# Patient Record
Sex: Male | Born: 1985 | Race: White | Hispanic: No | Marital: Married | Smoking: Current every day smoker
Health system: Southern US, Community
[De-identification: ages and names within clinical notes are randomized; demographics above are authoritative.]

## PROBLEM LIST (undated history)

## (undated) DIAGNOSIS — L02419 Cutaneous abscess of limb, unspecified: Secondary | ICD-10-CM

## (undated) DIAGNOSIS — B9689 Other specified bacterial agents as the cause of diseases classified elsewhere: Secondary | ICD-10-CM

## (undated) DIAGNOSIS — J189 Pneumonia, unspecified organism: Secondary | ICD-10-CM

## (undated) DIAGNOSIS — M00051 Staphylococcal arthritis, right hip: Secondary | ICD-10-CM

## (undated) DIAGNOSIS — M00011 Staphylococcal arthritis, right shoulder: Secondary | ICD-10-CM

## (undated) DIAGNOSIS — I368 Other nonrheumatic tricuspid valve disorders: Secondary | ICD-10-CM

## (undated) DIAGNOSIS — J181 Lobar pneumonia, unspecified organism: Secondary | ICD-10-CM

## (undated) DIAGNOSIS — R768 Other specified abnormal immunological findings in serum: Secondary | ICD-10-CM

## (undated) DIAGNOSIS — Z86718 Personal history of other venous thrombosis and embolism: Secondary | ICD-10-CM

## (undated) DIAGNOSIS — M71162 Other infective bursitis, left knee: Secondary | ICD-10-CM

## (undated) DIAGNOSIS — I079 Rheumatic tricuspid valve disease, unspecified: Secondary | ICD-10-CM

## (undated) DIAGNOSIS — R7881 Bacteremia: Secondary | ICD-10-CM

## (undated) DIAGNOSIS — M00052 Staphylococcal arthritis, left hip: Secondary | ICD-10-CM

## (undated) DIAGNOSIS — R569 Unspecified convulsions: Secondary | ICD-10-CM

## (undated) HISTORY — DX: Cutaneous abscess of limb, unspecified: L02.419

## (undated) HISTORY — DX: Other nonrheumatic tricuspid valve disorders: I36.8

## (undated) HISTORY — DX: Other specified abnormal immunological findings in serum: R76.8

## (undated) HISTORY — DX: Lobar pneumonia, unspecified organism: J18.1

## (undated) HISTORY — DX: Staphylococcal arthritis, left hip: M00.052

## (undated) HISTORY — DX: Pneumonia, unspecified organism: J18.9

## (undated) HISTORY — DX: Bacteremia: R78.81

## (undated) HISTORY — DX: Other specified bacterial agents as the cause of diseases classified elsewhere: B96.89

## (undated) HISTORY — DX: Staphylococcal arthritis, right hip: M00.051

## (undated) HISTORY — DX: Other infective bursitis, left knee: M71.162

## (undated) HISTORY — DX: Rheumatic tricuspid valve disease, unspecified: I07.9

## (undated) HISTORY — DX: Personal history of other venous thrombosis and embolism: Z86.718

## (undated) HISTORY — PX: HAND SURGERY: SHX662

## (undated) HISTORY — PX: CHOLECYSTECTOMY: SHX55

## (undated) HISTORY — DX: Staphylococcal arthritis, right shoulder: M00.011

---
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
## 2005-05-31 ENCOUNTER — Emergency Department (HOSPITAL_COMMUNITY): Admission: EM | Admit: 2005-05-31 | Discharge: 2005-05-31 | Payer: Self-pay | Admitting: Family Medicine

## 2006-03-17 ENCOUNTER — Emergency Department (HOSPITAL_COMMUNITY): Admission: EM | Admit: 2006-03-17 | Discharge: 2006-03-17 | Payer: Self-pay | Admitting: Emergency Medicine

## 2006-06-19 ENCOUNTER — Emergency Department (HOSPITAL_COMMUNITY): Admission: EM | Admit: 2006-06-19 | Discharge: 2006-06-19 | Payer: Self-pay | Admitting: Emergency Medicine

## 2007-06-18 ENCOUNTER — Emergency Department (HOSPITAL_COMMUNITY): Admission: EM | Admit: 2007-06-18 | Discharge: 2007-06-18 | Payer: Self-pay | Admitting: Emergency Medicine

## 2007-06-23 ENCOUNTER — Ambulatory Visit (HOSPITAL_COMMUNITY): Admission: RE | Admit: 2007-06-23 | Discharge: 2007-06-23 | Payer: Self-pay | Admitting: Orthopedic Surgery

## 2007-06-27 ENCOUNTER — Encounter: Admission: RE | Admit: 2007-06-27 | Discharge: 2007-06-28 | Payer: Self-pay | Admitting: Orthopedic Surgery

## 2008-07-03 ENCOUNTER — Emergency Department (HOSPITAL_COMMUNITY): Admission: EM | Admit: 2008-07-03 | Discharge: 2008-07-03 | Payer: Self-pay | Admitting: Emergency Medicine

## 2008-11-29 ENCOUNTER — Emergency Department (HOSPITAL_BASED_OUTPATIENT_CLINIC_OR_DEPARTMENT_OTHER): Admission: EM | Admit: 2008-11-29 | Discharge: 2008-11-29 | Payer: Self-pay | Admitting: Emergency Medicine

## 2008-11-29 ENCOUNTER — Ambulatory Visit: Payer: Self-pay | Admitting: Psychiatry

## 2008-11-29 ENCOUNTER — Inpatient Hospital Stay (HOSPITAL_COMMUNITY): Admission: AD | Admit: 2008-11-29 | Discharge: 2008-12-03 | Payer: Self-pay | Admitting: Psychiatry

## 2009-05-27 ENCOUNTER — Inpatient Hospital Stay (HOSPITAL_COMMUNITY): Admission: EM | Admit: 2009-05-27 | Discharge: 2009-05-30 | Payer: Self-pay | Admitting: Emergency Medicine

## 2009-05-27 ENCOUNTER — Emergency Department (HOSPITAL_COMMUNITY): Admission: EM | Admit: 2009-05-27 | Discharge: 2009-05-27 | Payer: Self-pay | Admitting: Emergency Medicine

## 2009-05-29 ENCOUNTER — Encounter (INDEPENDENT_AMBULATORY_CARE_PROVIDER_SITE_OTHER): Payer: Self-pay | Admitting: General Surgery

## 2010-01-21 ENCOUNTER — Emergency Department (HOSPITAL_COMMUNITY): Admission: EM | Admit: 2010-01-21 | Discharge: 2010-01-21 | Payer: Self-pay | Admitting: Family Medicine

## 2010-01-22 ENCOUNTER — Ambulatory Visit (HOSPITAL_COMMUNITY): Admission: AD | Admit: 2010-01-22 | Discharge: 2010-01-22 | Payer: Self-pay | Admitting: Orthopedic Surgery

## 2010-05-05 ENCOUNTER — Emergency Department (HOSPITAL_COMMUNITY): Admission: EM | Admit: 2010-05-05 | Discharge: 2010-05-05 | Payer: Self-pay | Admitting: Family Medicine

## 2010-06-16 ENCOUNTER — Emergency Department (HOSPITAL_COMMUNITY): Admission: EM | Admit: 2010-06-16 | Discharge: 2010-06-16 | Payer: Self-pay | Admitting: Emergency Medicine

## 2010-09-17 ENCOUNTER — Inpatient Hospital Stay (INDEPENDENT_AMBULATORY_CARE_PROVIDER_SITE_OTHER)
Admission: RE | Admit: 2010-09-17 | Discharge: 2010-09-17 | Disposition: A | Discharge status: Home / Self Care | Payer: Self-pay | Source: Ambulatory Visit | Attending: Emergency Medicine | Admitting: Emergency Medicine

## 2010-09-17 DIAGNOSIS — I889 Nonspecific lymphadenitis, unspecified: Secondary | ICD-10-CM

## 2010-09-17 DIAGNOSIS — R070 Pain in throat: Secondary | ICD-10-CM

## 2010-09-17 LAB — DIFFERENTIAL
Basophils Absolute: 0 10*3/uL (ref 0.0–0.1)
Basophils Relative: 1 % (ref 0–1)
Eosinophils Absolute: 0.2 10*3/uL (ref 0.0–0.7)
Eosinophils Relative: 3 % (ref 0–5)
Lymphocytes Relative: 33 % (ref 12–46)
Lymphs Abs: 2.1 10*3/uL (ref 0.7–4.0)
Monocytes Absolute: 0.7 10*3/uL (ref 0.1–1.0)
Monocytes Relative: 11 % (ref 3–12)
Neutrophils Relative %: 53 % (ref 43–77)

## 2010-09-17 LAB — POCT RAPID STREP A (OFFICE): Streptococcus, Group A Screen (Direct): NEGATIVE

## 2010-09-17 LAB — POCT INFECTIOUS MONO SCREEN: Mono Screen: NEGATIVE

## 2010-09-20 ENCOUNTER — Emergency Department (HOSPITAL_COMMUNITY): Payer: Self-pay

## 2010-09-20 ENCOUNTER — Emergency Department (HOSPITAL_COMMUNITY)
Admission: EM | Admit: 2010-09-20 | Discharge: 2010-09-20 | Disposition: A | Discharge status: Home / Self Care | Payer: Self-pay | Attending: Emergency Medicine | Admitting: Emergency Medicine

## 2010-09-20 DIAGNOSIS — I889 Nonspecific lymphadenitis, unspecified: Secondary | ICD-10-CM | POA: Insufficient documentation

## 2010-09-20 DIAGNOSIS — R1110 Vomiting, unspecified: Secondary | ICD-10-CM | POA: Insufficient documentation

## 2010-09-20 DIAGNOSIS — R220 Localized swelling, mass and lump, head: Secondary | ICD-10-CM | POA: Insufficient documentation

## 2010-09-20 DIAGNOSIS — M542 Cervicalgia: Secondary | ICD-10-CM | POA: Insufficient documentation

## 2010-09-20 DIAGNOSIS — R070 Pain in throat: Secondary | ICD-10-CM | POA: Insufficient documentation

## 2010-09-20 LAB — BASIC METABOLIC PANEL
BUN: 11 mg/dL (ref 6–23)
CO2: 23 mEq/L (ref 19–32)
Calcium: 9.2 mg/dL (ref 8.4–10.5)
Chloride: 105 mEq/L (ref 96–112)
Creatinine, Ser: 0.88 mg/dL (ref 0.4–1.5)
GFR calc Af Amer: >60 mL/min (ref >60)
Glucose, Bld: 116 mg/dL — ABNORMAL HIGH (ref 70–99)
Potassium: 4.8 mEq/L (ref 3.5–5.1)
Sodium: 137 mEq/L (ref 135–145)

## 2010-09-20 LAB — CBC
HCT: 42.1 % (ref 39.0–52.0)
HCT: 45.4 % (ref 39.0–52.0)
Hemoglobin: 14.8 g/dL (ref 13.0–17.0)
Hemoglobin: 16.4 g/dL (ref 13.0–17.0)
MCH: 33.6 pg (ref 26.0–34.0)
MCH: 33.3 pg (ref 26.0–34.0)
MCHC: 35.2 g/dL (ref 30.0–36.0)
MCHC: 36.1 g/dL — ABNORMAL HIGH (ref 30.0–36.0)
MCV: 95.5 fL (ref 78.0–100.0)
MCV: 92.1 fL (ref 78.0–100.0)
Platelets: 189 10*3/uL (ref 150–400)
Platelets: 192 10*3/uL (ref 150–400)
RBC: 4.41 MIL/uL (ref 4.22–5.81)
RBC: 4.93 MIL/uL (ref 4.22–5.81)
RDW: 12.7 % (ref 11.5–15.5)
RDW: 12.4 % (ref 11.5–15.5)
WBC: 6.5 10*3/uL (ref 4.0–10.5)

## 2010-09-20 LAB — DIFFERENTIAL
Basophils Absolute: 0 10*3/uL (ref 0.0–0.1)
Basophils Relative: 1 % (ref 0–1)
Eosinophils Absolute: 0.1 10*3/uL (ref 0.0–0.7)
Eosinophils Relative: 1 % (ref 0–5)
Lymphocytes Relative: 24 % (ref 12–46)
Lymphs Abs: 1.9 10*3/uL (ref 0.7–4.0)
Monocytes Absolute: 0.9 10*3/uL (ref 0.1–1.0)
Monocytes Relative: 12 % (ref 3–12)

## 2010-09-20 MED ORDER — IOHEXOL 300 MG/ML  SOLN: 75.0000 mL | Freq: Once | INTRAMUSCULAR | Status: DC | PRN

## 2010-10-18 LAB — BASIC METABOLIC PANEL
BUN: 9 mg/dL (ref 6–23)
CO2: 26 mEq/L (ref 19–32)
Calcium: 8.9 mg/dL (ref 8.4–10.5)
Chloride: 109 mEq/L (ref 96–112)
Creatinine, Ser: 0.88 mg/dL (ref 0.4–1.5)
GFR calc Af Amer: >60 mL/min (ref >60)
GFR calc non Af Amer: >60 mL/min (ref >60)
Glucose, Bld: 96 mg/dL (ref 70–99)
Potassium: 4.1 mEq/L (ref 3.5–5.1)
Sodium: 140 mEq/L (ref 135–145)

## 2010-10-18 LAB — SURGICAL PCR SCREEN
MRSA, PCR: NEGATIVE
Staphylococcus aureus: NEGATIVE

## 2010-10-18 LAB — CBC
HCT: 43.8 % (ref 39.0–52.0)
Hemoglobin: 14.9 g/dL (ref 13.0–17.0)
MCH: 34 pg (ref 26.0–34.0)
MCHC: 34 g/dL (ref 30.0–36.0)
MCV: 100 fL (ref 78.0–100.0)
Platelets: 206 10*3/uL (ref 150–400)
RBC: 4.38 MIL/uL (ref 4.22–5.81)
RDW: 12.8 % (ref 11.5–15.5)
WBC: 5.3 10*3/uL (ref 4.0–10.5)

## 2010-11-05 LAB — DIFFERENTIAL
Basophils Absolute: 0 10*3/uL (ref 0.0–0.1)
Basophils Relative: 0 % (ref 0–1)
Eosinophils Absolute: 0 10*3/uL (ref 0.0–0.7)
Eosinophils Relative: 0 % (ref 0–5)
Lymphocytes Relative: 14 % (ref 12–46)
Lymphs Abs: 1.6 10*3/uL (ref 0.7–4.0)
Monocytes Absolute: 0.6 10*3/uL (ref 0.1–1.0)
Monocytes Relative: 6 % (ref 3–12)
Neutro Abs: 8.7 10*3/uL — ABNORMAL HIGH (ref 1.7–7.7)
Neutrophils Relative %: 79 % — ABNORMAL HIGH (ref 43–77)

## 2010-11-05 LAB — URINALYSIS, ROUTINE W REFLEX MICROSCOPIC
Bilirubin Urine: NEGATIVE
Glucose, UA: NEGATIVE mg/dL
Hgb urine dipstick: NEGATIVE
Ketones, ur: NEGATIVE mg/dL
Nitrite: NEGATIVE
Protein, ur: NEGATIVE mg/dL
Specific Gravity, Urine: 1.012 (ref 1.005–1.030)
Urobilinogen, UA: 0.2 mg/dL (ref 0.0–1.0)
pH: 8 (ref 5.0–8.0)

## 2010-11-05 LAB — CBC
HCT: 35.6 % — ABNORMAL LOW (ref 39.0–52.0)
HCT: 43.7 % (ref 39.0–52.0)
Hemoglobin: 12.6 g/dL — ABNORMAL LOW (ref 13.0–17.0)
Hemoglobin: 15.4 g/dL (ref 13.0–17.0)
MCHC: 35.3 g/dL (ref 30.0–36.0)
MCHC: 35.4 g/dL (ref 30.0–36.0)
MCV: 97.2 fL (ref 78.0–100.0)
MCV: 97.6 fL (ref 78.0–100.0)
Platelets: 188 K/uL (ref 150–400)
Platelets: 263 10*3/uL (ref 150–400)
RBC: 3.67 MIL/uL — ABNORMAL LOW (ref 4.22–5.81)
RBC: 4.47 MIL/uL (ref 4.22–5.81)
RDW: 12.5 % (ref 11.5–15.5)
RDW: 12.6 % (ref 11.5–15.5)
WBC: 11 10*3/uL — ABNORMAL HIGH (ref 4.0–10.5)
WBC: 6.1 K/uL (ref 4.0–10.5)

## 2010-11-05 LAB — COMPREHENSIVE METABOLIC PANEL WITH GFR
ALT: 25 U/L (ref 0–53)
AST: 16 U/L (ref 0–37)
Albumin: 3.3 g/dL — ABNORMAL LOW (ref 3.5–5.2)
Alkaline Phosphatase: 76 U/L (ref 39–117)
BUN: 5 mg/dL — ABNORMAL LOW (ref 6–23)
CO2: 28 meq/L (ref 19–32)
Calcium: 8.6 mg/dL (ref 8.4–10.5)
Chloride: 107 meq/L (ref 96–112)
Creatinine, Ser: 0.94 mg/dL (ref 0.4–1.5)
GFR calc non Af Amer: >60 mL/min
Glucose, Bld: 86 mg/dL (ref 70–99)
Potassium: 4.2 meq/L (ref 3.5–5.1)
Sodium: 140 meq/L (ref 135–145)
Total Bilirubin: 0.7 mg/dL (ref 0.3–1.2)
Total Protein: 5.9 g/dL — ABNORMAL LOW (ref 6.0–8.3)

## 2010-11-05 LAB — COMPREHENSIVE METABOLIC PANEL
ALT: 35 U/L (ref 0–53)
AST: 24 U/L (ref 0–37)
Albumin: 4.4 g/dL (ref 3.5–5.2)
Alkaline Phosphatase: 89 U/L (ref 39–117)
BUN: 6 mg/dL (ref 6–23)
CO2: 25 mEq/L (ref 19–32)
Calcium: 9.5 mg/dL (ref 8.4–10.5)
Chloride: 102 mEq/L (ref 96–112)
Creatinine, Ser: 0.8 mg/dL (ref 0.4–1.5)
GFR calc Af Amer: >60 mL/min (ref >60)
GFR calc non Af Amer: >60 mL/min (ref >60)
Glucose, Bld: 96 mg/dL (ref 70–99)
Potassium: 4.1 mEq/L (ref 3.5–5.1)
Sodium: 138 mEq/L (ref 135–145)
Total Bilirubin: 0.7 mg/dL (ref 0.3–1.2)
Total Protein: 7.9 g/dL (ref 6.0–8.3)

## 2010-11-05 LAB — LIPASE, BLOOD: Lipase: <10 U/L — ABNORMAL LOW (ref 11–59)

## 2010-11-11 LAB — CBC
HCT: 46.7 % (ref 39.0–52.0)
Hemoglobin: 16.2 g/dL (ref 13.0–17.0)
MCHC: 34.8 g/dL (ref 30.0–36.0)
RDW: 11.7 % (ref 11.5–15.5)

## 2010-11-11 LAB — DIFFERENTIAL
Basophils Absolute: 0 10*3/uL (ref 0.0–0.1)
Eosinophils Relative: 3 % (ref 0–5)
Lymphocytes Relative: 26 % (ref 12–46)
Monocytes Absolute: 0.7 10*3/uL (ref 0.1–1.0)

## 2010-11-11 LAB — BASIC METABOLIC PANEL
CO2: 25 mEq/L (ref 19–32)
GFR calc non Af Amer: >60 mL/min (ref >60)
Glucose, Bld: 94 mg/dL (ref 70–99)
Potassium: 4.1 mEq/L (ref 3.5–5.1)
Sodium: 142 mEq/L (ref 135–145)

## 2010-11-11 LAB — HEPATIC FUNCTION PANEL
ALT: 9 U/L (ref 0–53)
Bilirubin, Direct: 0 mg/dL (ref 0.0–0.3)
Indirect Bilirubin: 0.8 mg/dL (ref 0.3–0.9)
Total Bilirubin: 0.8 mg/dL (ref 0.3–1.2)

## 2010-11-11 LAB — POCT TOXICOLOGY PANEL
Cocaine: POSITIVE
Tetrahydrocannabinol: POSITIVE
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
## 2010-12-15 NOTE — Op Note (Signed)
[name], Gary Hicks                   ACCOUNT NO.:  [id number]   MEDICAL RECORD NO.:  [id number]          PATIENT TYPE:  AMB   LOCATION:  SDS                          FACILITY:  [facility]   PHYSICIAN:  Artist Pais. Weingold, M.D.DATE OF BIRTH:  1986/04/15   DATE OF PROCEDURE:  06/23/2007  DATE OF DISCHARGE:  06/23/2007                               OPERATIVE REPORT   PREOPERATIVE DIAGNOSIS:  Displaced fifth metacarpal fracture, right  hand.   POSTOPERATIVE DIAGNOSIS:  Displaced fifth metacarpal fracture, right  hand.   PROCEDURE:  Open reduction internal fixation of above using 1.6-mm IM  rod.   SURGEON:  Artist Pais. Mina Marble, M.D.   ASSISTANT:  None.   ANESTHESIA:  General.   TOURNIQUET TIME:  26 minutes.  No complication.  No drains.   OPERATIVE REPORT:  The patient was taken to operating suite.  After the  induction of adequate general anesthesia, right upper extremity was  prepped and draped in sterile fashion.  An Esmarch was used to  exsanguinate the limb.  Tourniquet was then inflated to 250 mmHg.  At  this point in time, incision was made transversely at the base of the  fifth metacarpal.  Skin was incised.  Dissection was carried down to the  metacarpal diaphyseal flair.  Fluoroscopy was used to identify the  proximal most aspect of the fifth metacarpal.  At this point in time,  using the [employer] awl, a small cortical wound was made on the  dorsal ulnar side of the fifth metacarpal base.  The IM rod was  introduced into the intramedullary canal.  Manual traction, fluoroscopic  guidance and pressure over the distal fragment was applied.  The IM rod  was then passed across the fracture site.  Once this was done,  intraoperative fluoroscopy revealed near anatomic reduction in both the  AP, lateral and oblique view.  The IM rod was cut outside the skin, bent  to 90 degrees and then cut below the skin.  Wound was irrigated and  loosely closed with 4-0 Vicryl Rapide.   Xeroform, 4x4s and ulnar gutter  splint was applied.  The patient tolerated the procedure well and was  taken to the recovery room in stable fashion.      Artist Pais Mina Marble, M.D.  Electronically Signed     MAW/MEDQ  D:  06/23/2007  T:  06/24/2007  Job:  [id number]

## 2010-12-15 NOTE — Op Note (Signed)
NAMEKUPONO, MARLING                   ACCOUNT NO.:  [id number]   MEDICAL RECORD NO.:  [id number]          PATIENT TYPE:  AMB   LOCATION:  SDS                          FACILITY:  [facility]   PHYSICIAN:  Artist Pais. Weingold, M.D.DATE OF BIRTH:  [birth year]   DATE OF PROCEDURE:  06/23/2007  DATE OF DISCHARGE:  06/23/2007                               OPERATIVE REPORT   PREOPERATIVE DIAGNOSES:  Displaced fracture fifth metacarpal.   POSTOPERATIVE DIAGNOSES:  Displaced fracture fifth metacarpal.   PROCEDURE:  IM rodding above.   SURGEON:  Artist Pais. Mina Marble, M.D.   ASSISTANT:  None.   ANESTHESIA:  General.   TOURNIQUET TIME:  21 minutes.   COMPLICATIONS:  None.   DRAINS:  None.   DESCRIPTION OF PROCEDURE:  The patient was taken to operating suite and  after the induction of adequate general anesthesia, the right upper  extremity was prepped and draped in the usual sterile fashion.  An  esmarch was used to exsanguinate the limb, the tourniquet was then  inflated to 250  mmHg. At this point in time, a small incision was made  at the base of the fifth metacarpal. Dissection was carried down to the  shaft. __________ tendons were retracted  Using the 1.6-mm IM rod __________  a small hole was made in the dorsal  ulnar cortex, IM rod was introduced into the intramedullary canal,  reduction was performed closed, rod was taken across the fracture site  under direct and fluoroscopic guidance. Once the fracture was reduced,  the IM rod was bent to 90 degrees, the long axis of the bone cut below  the skin. The wound was then irrigated and closed with 4-0 Vicryl  Rapide. Xeroform, 4 x 4 and an ulnar splint was applied.  The patient  tolerated well and __________ .      Artist Pais Mina Marble, M.D.  Electronically Signed     MAW/MEDQ  D:  06/25/2007  T:  06/26/2007  Job:  [id number]
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
## 2010-12-20 ENCOUNTER — Emergency Department (HOSPITAL_COMMUNITY)
Admission: EM | Admit: 2010-12-20 | Discharge: 2010-12-20 | Disposition: A | Discharge status: Home / Self Care | Payer: Self-pay | Attending: Emergency Medicine | Admitting: Emergency Medicine

## 2010-12-20 DIAGNOSIS — K089 Disorder of teeth and supporting structures, unspecified: Secondary | ICD-10-CM | POA: Insufficient documentation

## 2010-12-27 ENCOUNTER — Emergency Department (HOSPITAL_COMMUNITY)
Admission: EM | Admit: 2010-12-27 | Discharge: 2010-12-27 | Disposition: A | Discharge status: Home / Self Care | Payer: Self-pay | Attending: Emergency Medicine | Admitting: Emergency Medicine

## 2010-12-27 DIAGNOSIS — I10 Essential (primary) hypertension: Secondary | ICD-10-CM | POA: Insufficient documentation

## 2010-12-27 DIAGNOSIS — K089 Disorder of teeth and supporting structures, unspecified: Secondary | ICD-10-CM | POA: Insufficient documentation

## 2011-01-08 ENCOUNTER — Emergency Department (HOSPITAL_COMMUNITY)
Admission: EM | Admit: 2011-01-08 | Discharge: 2011-01-08 | Disposition: A | Discharge status: Home / Self Care | Payer: Self-pay | Attending: Emergency Medicine | Admitting: Emergency Medicine

## 2011-01-08 DIAGNOSIS — Z79899 Other long term (current) drug therapy: Secondary | ICD-10-CM | POA: Insufficient documentation

## 2011-01-08 DIAGNOSIS — R109 Unspecified abdominal pain: Secondary | ICD-10-CM | POA: Insufficient documentation

## 2011-01-08 DIAGNOSIS — R10819 Abdominal tenderness, unspecified site: Secondary | ICD-10-CM | POA: Insufficient documentation

## 2011-01-08 LAB — COMPREHENSIVE METABOLIC PANEL
ALT: 28 U/L (ref 0–53)
Alkaline Phosphatase: 65 U/L (ref 39–117)
BUN: 12 mg/dL (ref 6–23)
CO2: 27 mEq/L (ref 19–32)
Chloride: 101 mEq/L (ref 96–112)
GFR calc Af Amer: >60 mL/min (ref >60)
Glucose, Bld: 90 mg/dL (ref 70–99)
Potassium: 3.5 mEq/L (ref 3.5–5.1)
Sodium: 138 mEq/L (ref 135–145)
Total Bilirubin: 0.4 mg/dL (ref 0.3–1.2)

## 2011-01-08 LAB — URINALYSIS, ROUTINE W REFLEX MICROSCOPIC
Bilirubin Urine: NEGATIVE
Glucose, UA: NEGATIVE mg/dL
Hgb urine dipstick: NEGATIVE
Protein, ur: NEGATIVE mg/dL
Specific Gravity, Urine: 1.024 (ref 1.005–1.030)
Urobilinogen, UA: 0.2 mg/dL (ref 0.0–1.0)

## 2011-01-08 LAB — CBC
HCT: 43.1 % (ref 39.0–52.0)
Hemoglobin: 15.9 g/dL (ref 13.0–17.0)
RBC: 4.59 MIL/uL (ref 4.22–5.81)
WBC: 7.9 10*3/uL (ref 4.0–10.5)

## 2011-01-08 LAB — DIFFERENTIAL
Basophils Absolute: 0 10*3/uL (ref 0.0–0.1)
Lymphocytes Relative: 37 % (ref 12–46)
Monocytes Absolute: 0.7 10*3/uL (ref 0.1–1.0)
Neutro Abs: 4.2 10*3/uL (ref 1.7–7.7)
Neutrophils Relative %: 53 % (ref 43–77)

## 2011-01-08 LAB — LIPASE, BLOOD: Lipase: 16 U/L (ref 11–59)

## 2011-04-23 ENCOUNTER — Inpatient Hospital Stay (INDEPENDENT_AMBULATORY_CARE_PROVIDER_SITE_OTHER)
Admission: RE | Admit: 2011-04-23 | Discharge: 2011-04-23 | Disposition: A | Discharge status: Home / Self Care | Payer: Self-pay | Source: Ambulatory Visit | Attending: Family Medicine | Admitting: Family Medicine

## 2011-04-23 ENCOUNTER — Ambulatory Visit (INDEPENDENT_AMBULATORY_CARE_PROVIDER_SITE_OTHER): Payer: Self-pay

## 2011-04-23 DIAGNOSIS — S5010XA Contusion of unspecified forearm, initial encounter: Secondary | ICD-10-CM

## 2011-05-07 LAB — POCT RAPID STREP A: Streptococcus, Group A Screen (Direct): NEGATIVE

## 2011-05-11 LAB — CBC
HCT: 46
Hemoglobin: 15.8
MCHC: 34.3
MCV: 96.6
Platelets: 258
RBC: 4.77
RDW: 12.3
WBC: 7.2

## 2011-08-19 ENCOUNTER — Emergency Department (INDEPENDENT_AMBULATORY_CARE_PROVIDER_SITE_OTHER)
Admission: EM | Admit: 2011-08-19 | Discharge: 2011-08-19 | Disposition: A | Discharge status: Home / Self Care | Payer: Self-pay | Source: Home / Self Care | Attending: Family Medicine | Admitting: Family Medicine

## 2011-08-19 ENCOUNTER — Encounter (HOSPITAL_COMMUNITY): Payer: Self-pay | Admitting: Emergency Medicine

## 2011-08-19 ENCOUNTER — Emergency Department (INDEPENDENT_AMBULATORY_CARE_PROVIDER_SITE_OTHER): Payer: Self-pay

## 2011-08-19 DIAGNOSIS — IMO0002 Reserved for concepts with insufficient information to code with codable children: Secondary | ICD-10-CM

## 2011-08-19 DIAGNOSIS — S239XXA Sprain of unspecified parts of thorax, initial encounter: Secondary | ICD-10-CM

## 2011-08-19 DIAGNOSIS — Z041 Encounter for examination and observation following transport accident: Secondary | ICD-10-CM

## 2011-08-19 MED ORDER — IBUPROFEN 800 MG PO TABS: 800.0000 mg | ORAL_TABLET | Freq: Three times a day (TID) | ORAL | Status: AC

## 2011-08-19 MED ORDER — CYCLOBENZAPRINE HCL 5 MG PO TABS: 5.0000 mg | ORAL_TABLET | Freq: Three times a day (TID) | ORAL | Status: AC | PRN
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
## 2011-09-02 ENCOUNTER — Encounter (HOSPITAL_COMMUNITY): Payer: Self-pay

## 2011-09-02 ENCOUNTER — Emergency Department (HOSPITAL_COMMUNITY): Payer: Worker's Compensation

## 2011-09-02 ENCOUNTER — Emergency Department (HOSPITAL_COMMUNITY)
Admission: EM | Admit: 2011-09-02 | Discharge: 2011-09-02 | Disposition: A | Discharge status: Home / Self Care | Payer: Worker's Compensation | Attending: Emergency Medicine | Admitting: Emergency Medicine

## 2011-09-02 DIAGNOSIS — M25569 Pain in unspecified knee: Secondary | ICD-10-CM | POA: Insufficient documentation

## 2011-09-02 DIAGNOSIS — W010XXA Fall on same level from slipping, tripping and stumbling without subsequent striking against object, initial encounter: Secondary | ICD-10-CM | POA: Insufficient documentation

## 2011-09-02 DIAGNOSIS — S8001XA Contusion of right knee, initial encounter: Secondary | ICD-10-CM

## 2011-09-02 DIAGNOSIS — S8000XA Contusion of unspecified knee, initial encounter: Secondary | ICD-10-CM | POA: Insufficient documentation

## 2011-09-02 DIAGNOSIS — D1620 Benign neoplasm of long bones of unspecified lower limb: Secondary | ICD-10-CM

## 2011-09-02 DIAGNOSIS — Y990 Civilian activity done for income or pay: Secondary | ICD-10-CM | POA: Insufficient documentation

## 2011-09-02 MED ORDER — OXYCODONE-ACETAMINOPHEN 5-325 MG PO TABS
1.0000 | ORAL_TABLET | Freq: Once | ORAL | Status: AC
  Administered 2011-09-02: 1 via ORAL
  Filled 2011-09-02: qty 1

## 2011-09-02 MED ORDER — HYDROCODONE-ACETAMINOPHEN 5-325 MG PO TABS: 1.0000 | ORAL_TABLET | ORAL | Status: AC | PRN

## 2011-09-02 NOTE — Discharge Instructions (Signed)
Follow-up with the orthopedist as discussed.  Continue with the 800 mg ibuprofen, one tablet every 8 hours with food.  Reserve vicodin for pain that does not improve with the ibuprofen.  Use of Crutches You have been prescribed crutches to take weight off one of your lower extremities (legs/feet). When using crutches, make sure you are not putting pressure on your armpits. This could cause damage to the nerves in the axilla (your armpit area) that extend to your hands and arms. When fitted properly the crutches should be two to three finger breadths below your axilla (armpit). Your weight should be supported by your hands, and not by resting upon the crutches with your armpits. When walking, first step with the crutches, then swing the healthy leg through and slightly ahead. When going up stairs, first step up with the healthy leg and then follow with the crutches and injured leg up to the same step, and so forth. If there is a handrail, hold both crutches in one hand, place your other hand on the handrail, and while placing your weight on your arms, lift your good leg to the step, then bring the crutches and the injured leg up to that step. Repeat for each step. When going down stairs, first step with the injured leg and crutches, following down with the healthy leg to the same step. Be very careful, as going down stairs with crutches is very challenging. If you feel wobbly or nervous, sit down and inch yourself down the stairs on your butt. To get up from a chair, hold injured leg forward, grab armrest with one hand and the top of the crutches with the other hand. Using these supports, pull yourself up to a standing position. Reverse this procedure for sitting. See your caregiver for follow up as suggested. If you are discharged in an ace wrap and develop numbness, tingling, swelling, or increased pain, loosen the ace wrap and re-wrap looser. If these problems persist, see your caregiver as needed. If  you have been instructed to use partial weight bearing, bear (apply) the amount of weight as suggested by your caregiver. Do not bear weight in an amount that causes pain on the area of injury. Document Released: 07/16/2000 Document Revised: 03/31/2011 Document Reviewed: 09/23/2008 [facility] Patient Information 2012 [place], [place].  Knee Immobilization You have been prescribed a knee immobilizer. This is used to support and protect an injured or painful knee. Knee immobilizers keep your knee from being used while it is healing. Some of the common immobilizers used include splints (air, plaster, fiberglass or aluminum) or casts. Wear your knee immobilizer as instructed and only remove it as instructed. If the immobilizer is used to protect broken bones, torn ligaments or torn cartilage, it may be 4 to 6 weeks before you are able to begin rehabilitating your knee, and it may be longer than that before you are able to return to athletic activity. HOME CARE INSTRUCTIONS   Use powder to control irritation from sweat and friction.   Adjust the immobilizer to be firm but not tight. Signs of an immobilizer that is too tight include:   Swelling.   Numbness.   Color change in your foot or ankle.   Increased pain.   While resting, raise your leg above the level of your heart. This reduces throbbing and helps healing. Prop it up with pillows.   Remove the immobilizer to bathe and sleep. Wear it other times until you see your doctor again.  When your  splint or cast is removed:   Stretching and strengthening are important in caring for and preventing knee injuries. If your knee begins to get sore while you are conditioning, decrease or back off your activities until you no longer have discomfort. Then gradually resume your activities.   When strengthening your knee, increase your activities a little at a time so as not to develop injuries from over use.   Work out an exercise plan with your caregiver  or physical therapist to get the best program for you.  SEEK IMMEDIATE MEDICAL CARE IF:   Your knee seems to be getting worse rather than better.   You have increasing pain or swelling in the knee, foot, or ankle.   You have problems caused by the knee immobilizer or it breaks or needs replacement.   You have increased swelling or redness or soreness (inflammation) in your knee.   Your leg becomes warm and more painful.   You develop an unexplained temperature over 102 F (38.9 C).  MAKE SURE YOU:   Understand these instructions.   Will watch your condition.   Will get help right away if you are not doing well or get worse.  Document Released: 07/19/2005 Document Revised: 03/31/2011 Document Reviewed: 01/04/2007 [facility] Patient Information 2012 [place], [place].  Abrasions Abrasions are skin scrapes. Their treatment depends on how large and deep the abrasion is. Abrasions do not extend through all layers of the skin. A cut or lesion through all skin layers is called a laceration. HOME CARE INSTRUCTIONS   If you were given a dressing, change it at least once a [place] or as instructed by your caregiver. If the bandage sticks, soak it off with a solution of water or hydrogen peroxide.   Twice a [place], wash the area with soap and water to remove all the cream/ointment. You may do this in a sink, under a tub faucet, or in a shower. Rinse off the soap and pat dry with a clean towel. Look for signs of infection (see below).   Reapply cream/ointment according to your caregiver's instruction. This will help prevent infection and keep the bandage from sticking. Telfa or gauze over the wound and under the dressing or wrap will also help keep the bandage from sticking.   If the bandage becomes wet, dirty, or develops a foul smell, change it as soon as possible.   Only take over-the-counter or prescription medicines for pain, discomfort, or fever as directed by your caregiver.  SEEK IMMEDIATE  MEDICAL CARE IF:   Increasing pain in the wound.   Signs of infection develop: redness, swelling, surrounding area is tender to touch, or pus coming from the wound.   You have a fever.   Any foul smell coming from the wound or dressing.  Most skin wounds heal within ten days. Facial wounds heal faster. However, an infection may occur despite proper treatment. You should have the wound checked for signs of infection within 24 to 48 hours or sooner if problems arise. If you were not given a wound-check appointment, look closely at the wound yourself on the second [place] for early signs of infection listed above. MAKE SURE YOU:   Understand these instructions.   Will watch your condition.   Will get help right away if you are not doing well or get worse.  Document Released: 04/28/2005 Document Revised: 03/31/2011 Document Reviewed: 03/06/2008 [facility] Patient Information 2012 [place], [place].   Contusion A bruise (contusion) or hematoma is a collection of  blood under skin causing an area of discoloration. It is caused by an injury to blood vessels beneath the injured area with a release of blood into that area. As blood accumulates it is known as a hematoma. This collection of blood causes a blue to dark blue color. As the injury improves over days to weeks it turns to a yellowish color and then usually disappears completely over the same period of time. These generally resolve completely without problems. The hematoma rarely requires drainage. HOME CARE INSTRUCTIONS   Apply ice to the injured area for 15 to 20 minutes 3 to 4 times per [place] for the first 1 or 2 days.   Put the ice in a plastic bag and place a towel between the bag of ice and your skin. Discontinue the ice if it causes pain.   If bleeding is more than just a little, apply pressure to the area for at least thirty minutes to decrease the amount of bruising. Apply pressure and ice as your caregiver suggests.   If the injury is on  an extremity, elevation of that part may help to decrease pain and swelling. Wrapping with an ace or supportive wrap may also be helpful. If the bruise is on a lower extremity and is painful, crutches may be helpful for a couple days.   If you have been given a tetanus shot because the skin was broken, your arm may get swollen, red and warm to touch at the shot site. This is a normal response to the medicine in the shot. If you did not receive a tetanus shot today because you did not recall when your last one was given, make sure to check with your caregiver's office and determine if one is needed. Generally for a "dirty" wound, you should receive a tetanus booster if you have not had one in the last five years. If you have a "clean" wound, you should receive a tetanus booster if you have not had one within the last ten years.  SEEK MEDICAL CARE IF:   You have pain not controlled with over the counter medications. Only take over-the-counter or prescription medicines for pain, discomfort, or fever as directed by your caregiver. Do not use aspirin as it may cause bleeding.   You develop increasing pain or swelling in the area of injury.   You develop any problems which seem worse than the problems which brought you in.  SEEK IMMEDIATE MEDICAL CARE IF:   You have a fever.   You develop severe pain in the area of the bruise out of proportion to the initial injury.   The bruised area becomes red, tender, and swollen.  MAKE SURE YOU:   Understand these instructions.   Will watch your condition.   Will get help right away if you are not doing well or get worse.  Document Released: 04/28/2005 Document Revised: 03/31/2011 Document Reviewed: 03/06/2008 [facility] Patient Information 2012 [place], [place].

## 2011-09-02 NOTE — ED Notes (Signed)
Pt. Fell onto a stair into his rt. Knee, denies hitting his head , rt. Knee is red, swollen with an abrasion.

## 2011-09-02 NOTE — ED Notes (Signed)
PT IS IN XRAY. XRAY HAS BEEN CALLED AND WILL BRING PT TO CDU 2

## 2011-09-02 NOTE — ED Provider Notes (Signed)
History     CSN: [id number]  Arrival date & time 09/02/11  1753   First MD Initiated Contact with Patient 09/02/11 1845      Chief Complaint  Patient presents with  . Knee Injury    (Consider location/radiation/quality/duration/timing/severity/associated sxs/prior treatment) Patient is a 26 y.o. male presenting with knee pain.  Knee Pain This is a new problem. The current episode started today. The problem has been gradually worsening. Associated symptoms include arthralgias. The symptoms are aggravated by standing and walking. He has tried NSAIDs for the symptoms. The treatment provided mild relief.  Patient states he stumbled and fell on right knee while at work today. Patient also reports ongoing/chronic issue with pain in right knee. History reviewed. No pertinent past medical history.  Past Surgical History  Procedure Date  . Cholecystectomy   . Hand surgery     No family history on file.  History  Substance Use Topics  . Smoking status: Former Games developer  . Smokeless tobacco: Not on file  . Alcohol Use: No      Review of Systems  Musculoskeletal: Positive for arthralgias.  All other systems reviewed and are negative.    Allergies  Amoxicillin  Home Medications   Current Outpatient Rx  Name Route Sig Dispense Refill  . IBUPROFEN 800 MG PO TABS Oral Take 800 mg by mouth every 8 (eight) hours as needed. For pain      BP 144/81  Pulse 88  Temp(Src) 98.3 F (36.8 C) (Oral)  Resp 18  Ht 5\' 9"  (1.753 m)  Wt 160 lb (72.576 kg)  BMI 23.63 kg/m2  SpO2 99%  Physical Exam  Constitutional: He is oriented to person, place, and time. He appears well-developed and well-nourished.  HENT:  Head: Normocephalic.  Eyes: Pupils are equal, round, and reactive to light.  Neck: Normal range of motion. Neck supple.  Cardiovascular: Normal rate, regular rhythm, normal heart sounds and intact distal pulses.   Pulmonary/Chest: Effort normal and breath sounds normal.    Abdominal: Soft. Bowel sounds are normal.  Musculoskeletal: He exhibits tenderness.  Neurological: He is alert and oriented to person, place, and time.  Skin: Skin is warm and dry.  Psychiatric: He has a normal mood and affect.    ED Course  Procedures (including critical care time)  Labs Reviewed - No data to display Dg Knee Complete 4 Views Right  09/02/2011  *RADIOLOGY REPORT*  Clinical Data: Fall.  Anterior knee pain.  RIGHT KNEE - COMPLETE 4+ VIEW  Comparison: None.  Findings: There is an osteochondroma extending off the lateral aspect of the proximal tibial metaphysis adjacent to the proximal tibiofibular joint.  Alignment of the knee is anatomic.  No effusion.  No fracture.  IMPRESSION: No acute abnormality.  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  Original Report Authenticated By: Andreas Newport, M.D.     No diagnosis found.  1. Knee contusion s/p fall 2. Osteochondroma  MDM          Jimmye Norman, NP 09/02/11 1955

## 2011-09-03 NOTE — ED Provider Notes (Signed)
Medical screening examination/treatment/procedure(s) were performed by non-physician practitioner and as supervising physician I was immediately available for consultation/collaboration.   Tremaine Fuhriman A. Patrica Duel, MD 09/03/11 1455

## 2011-10-25 ENCOUNTER — Encounter (HOSPITAL_COMMUNITY): Payer: Self-pay | Admitting: Emergency Medicine

## 2011-10-25 ENCOUNTER — Emergency Department (INDEPENDENT_AMBULATORY_CARE_PROVIDER_SITE_OTHER)
Admission: EM | Admit: 2011-10-25 | Discharge: 2011-10-25 | Disposition: A | Discharge status: Home / Self Care | Payer: Self-pay | Source: Home / Self Care | Attending: Family Medicine | Admitting: Family Medicine

## 2011-10-25 DIAGNOSIS — H6090 Unspecified otitis externa, unspecified ear: Secondary | ICD-10-CM

## 2011-10-25 DIAGNOSIS — H60399 Other infective otitis externa, unspecified ear: Secondary | ICD-10-CM

## 2011-10-25 MED ORDER — OFLOXACIN 0.3 % OT SOLN: 5.0000 [drp] | Freq: Every day | OTIC | Status: DC

## 2011-10-25 MED ORDER — NEOMYCIN-POLYMYXIN-HC 3.5-10000-1 OT SOLN: 3.0000 [drp] | Freq: Four times a day (QID) | OTIC | Status: DC

## 2011-10-25 NOTE — Discharge Instructions (Signed)
Use ear drops as directed. Return to care should your symptoms not improve, or worsen in any way.

## 2011-10-25 NOTE — ED Notes (Addendum)
PT HERE WITH LEFT EAR PAIN THAT STARTED X 4 DYS AGO WITH BURNING SENSATION RADIATING DOWN TO LEFT NECK.symptoms ARE NOW SPREADING TO R EAR.C/O FULLNESS AND CONSTANT FRONTAL H/A AND NAUSEA.PT TRIED EAR DROPS OF FRIENDS BUT NOT WORKING.AFEBRILE

## 2011-10-25 NOTE — ED Provider Notes (Signed)
History     CSN: [id number]  Arrival date & time 10/25/11  1024   First MD Initiated Contact with Patient 10/25/11 1124      Chief Complaint  Patient presents with  . Otalgia  . Ear Fullness  . Headache    (Consider location/radiation/quality/duration/timing/severity/associated sxs/prior treatment) HPI Comments: Gary Hicks presents for evaluation of LEFT ear pain over the last few days. He now endorses decreased hearing. He denies any allergic symptoms, no cough, no sneezing. He tried OTC ear drops without relief. He is unsure about drainage.   Patient is a 26 y.o. male presenting with ear pain. The history is provided by the patient.  Otalgia This is a new problem. There is pain in the left ear. The problem occurs constantly. The problem has been gradually worsening. There has been no fever. The pain is moderate. Associated symptoms include hearing loss. Pertinent negatives include no ear discharge.    History reviewed. No pertinent past medical history.  Past Surgical History  Procedure Date  . Cholecystectomy   . Hand surgery     No family history on file.  History  Substance Use Topics  . Smoking status: Current Everyday Smoker  . Smokeless tobacco: Not on file  . Alcohol Use: No      Review of Systems  Constitutional: Negative.   HENT: Positive for hearing loss and ear pain. Negative for ear discharge.   Eyes: Negative.   Respiratory: Negative.   Cardiovascular: Negative.   Gastrointestinal: Negative.   Genitourinary: Negative.   Musculoskeletal: Negative.   Skin: Negative.   Neurological: Negative.     Allergies  Amoxicillin  Home Medications   Current Outpatient Rx  Name Route Sig Dispense Refill  . IBUPROFEN 800 MG PO TABS Oral Take 800 mg by mouth every 8 (eight) hours as needed. For pain    . NEOMYCIN-POLYMYXIN-HC 3.5-10000-1 OT SOLN Left Ear Place 3 drops into the left ear 4 (four) times daily. 10 mL 0  . OFLOXACIN 0.3 % OT SOLN Left Ear Place 5  drops into the left ear daily. 5 mL 1    BP 111/68  Pulse 80  Temp(Src) 98 F (36.7 C) (Oral)  Resp 14  SpO2 100%  Physical Exam  Nursing note and vitals reviewed. Constitutional: He is oriented to person, place, and time. He appears well-developed and well-nourished.  HENT:  Head: Normocephalic and atraumatic.  Right Ear: Tympanic membrane normal.  Left Ear: There is drainage and tenderness. Tympanic membrane is not erythematous and not bulging.  Eyes: EOM are normal.  Neck: Normal range of motion.  Pulmonary/Chest: Effort normal.  Musculoskeletal: Normal range of motion.  Neurological: He is alert and oriented to person, place, and time.  Skin: Skin is warm and dry.  Psychiatric: His behavior is normal.    ED Course  Procedures (including critical care time)  Labs Reviewed - No data to display No results found.   1. Otitis externa       MDM  Cortisporin and ofloxacin drops given, if cost is an issue        Renaee Munda, MD 10/25/11 [phone]

## 2011-11-01 ENCOUNTER — Emergency Department (HOSPITAL_COMMUNITY)
Admission: EM | Admit: 2011-11-01 | Discharge: 2011-11-01 | Disposition: A | Discharge status: Home / Self Care | Payer: Self-pay | Attending: Emergency Medicine | Admitting: Emergency Medicine

## 2011-11-01 ENCOUNTER — Encounter (HOSPITAL_COMMUNITY): Payer: Self-pay | Admitting: Emergency Medicine

## 2011-11-01 DIAGNOSIS — H60399 Other infective otitis externa, unspecified ear: Secondary | ICD-10-CM | POA: Insufficient documentation

## 2011-11-01 DIAGNOSIS — H6092 Unspecified otitis externa, left ear: Secondary | ICD-10-CM

## 2011-11-01 DIAGNOSIS — H6692 Otitis media, unspecified, left ear: Secondary | ICD-10-CM

## 2011-11-01 DIAGNOSIS — F17200 Nicotine dependence, unspecified, uncomplicated: Secondary | ICD-10-CM | POA: Insufficient documentation

## 2011-11-01 DIAGNOSIS — H6690 Otitis media, unspecified, unspecified ear: Secondary | ICD-10-CM | POA: Insufficient documentation

## 2011-11-01 MED ORDER — AZITHROMYCIN 250 MG PO TABS: ORAL_TABLET | ORAL | Status: AC

## 2011-11-01 MED ORDER — TRAMADOL HCL 50 MG PO TABS: 50.0000 mg | ORAL_TABLET | Freq: Four times a day (QID) | ORAL | Status: AC | PRN

## 2011-11-01 NOTE — ED Notes (Signed)
Pt reports ear pain after being diagnosised with ear infection at UC last week.  Pt has taken ear drops without relief.  large amount of wax noted in ear.  Pt reports vomiting x 3 this am-denies nausea now.

## 2011-11-01 NOTE — ED Notes (Signed)
Pt. Stated, I've had this ear pain since a week and i went to UC and they gave me ear drops and that was all.  Now I've had some throwing up 3 times this am.

## 2011-11-01 NOTE — ED Provider Notes (Signed)
History     CSN: [id number]  Arrival date & time 11/01/11  0805   First MD Initiated Contact with Patient 11/01/11 0822     8:48 AM HPI Patient reports pain in his left ear for more than 1 week. States initially he went to see [facility] and was given antibiotic ear drops for an otitis externa. States pain improved somewhat but yesterday it worsened. States he continues to have decreased hearing. Denies drainage, fever, headache, dizziness, neck pain, abdominal pain, nasal congestion, or cough. Reports some mild decreased hearing and nausea and vomiting.  Patient is a 26 y.o. male presenting with ear pain. The history is provided by the patient.  Otalgia This is a new problem. The current episode started more than 1 week ago. There is pain in the left ear. The problem occurs constantly. The problem has been gradually worsening. There has been no fever. The pain is at a severity of 8/10. The pain is severe. Associated symptoms include headaches, hearing loss and vomiting. Pertinent negatives include no ear discharge, no rhinorrhea, no sore throat, no abdominal pain, no diarrhea, no neck pain, no cough and no rash. His past medical history does not include tympanostomy tube.    History reviewed. No pertinent past medical history.  Past Surgical History  Procedure Date  . Cholecystectomy   . Hand surgery     History reviewed. No pertinent family history.  History  Substance Use Topics  . Smoking status: Current Everyday Smoker  . Smokeless tobacco: Not on file  . Alcohol Use: No      Review of Systems  Constitutional: Negative for fever and chills.  HENT: Positive for hearing loss and ear pain. Negative for congestion, sore throat, rhinorrhea, sneezing, neck pain, postnasal drip and ear discharge.   Respiratory: Negative for cough and shortness of breath.   Cardiovascular: Negative for chest pain.  Gastrointestinal: Positive for nausea and vomiting. Negative for  abdominal pain and diarrhea.  Skin: Negative for rash.  Neurological: Positive for headaches.  All other systems reviewed and are negative.    Allergies  Amoxicillin  Home Medications   Current Outpatient Rx  Name Route Sig Dispense Refill  . IBUPROFEN 800 MG PO TABS Oral Take 800 mg by mouth every 8 (eight) hours as needed. For pain    . NEOMYCIN-POLYMYXIN-HC 3.5-10000-1 OT SOLN Left Ear Place 3 drops into the left ear 4 (four) times daily.    . OFLOXACIN 0.3 % OT SOLN Left Ear Place 5 drops into the left ear daily.      BP 134/68  Pulse 103  Temp(Src) 98.3 F (36.8 C) (Oral)  Resp 18  SpO2 100%  Physical Exam  Constitutional: He is oriented to person, place, and time. He appears well-developed and well-nourished.  HENT:  Head: Normocephalic and atraumatic.  Right Ear: Hearing, tympanic membrane, external ear and ear canal normal.  Left Ear: There is swelling (canal is mildly edematous) and tenderness ( white drainage behind TMs ). No drainage (Pain with traction of pinna and palpation of tragus.). Tympanic membrane is bulging. Tympanic membrane is not perforated and not erythematous. A middle ear effusion is present. No hemotympanum. Decreased hearing is noted.  Nose: Nose normal.  Mouth/Throat: Uvula is midline, oropharynx is clear and moist and mucous membranes are normal.  Eyes: Pupils are equal, round, and reactive to light.  Neck: Normal range of motion. No spinous process tenderness and no muscular tenderness present.  Neurological: He is  alert and oriented to person, place, and time.  Skin: Skin is warm and dry. No rash noted. No erythema. No pallor.  Psychiatric: He has a normal mood and affect. His behavior is normal.    ED Course  Procedures   MDM   Patient has otitis media in addition to mild otitis externa. Will prescribe azithromycin since patient is allergic to amoxicillin. We'll also give referral to ENT and advised patient to followup if antibiotics do  not help within 48 hours. Patient does not have TM perforation. Advised to continue antibiotic drops given to him by urgent care      Thomasene Lot, PA-C 11/01/11 [phone]

## 2011-11-01 NOTE — Discharge Instructions (Signed)
Otitis Media, Adult  A middle ear infection is an infection in the space behind the eardrum. The medical name for this is "otitis media." It may happen after a common cold. It is caused by a germ that starts growing in that space. You may feel swollen glands in your neck on the side of the ear infection.  HOME CARE INSTRUCTIONS   · Take your medicine as directed until it is gone, even if you feel better after the first few days.  · Only take over-the-counter or prescription medicines for pain, discomfort, or fever as directed by your caregiver.  · Occasional use of a nasal decongestant a couple times per [place] may help with discomfort and help the eustachian tube to drain better.  Follow up with your caregiver in 10 to 14 days or as directed, to be certain that the infection has cleared. Not keeping the appointment could result in a chronic or permanent injury, pain, hearing loss and disability. If there is any problem keeping the appointment, you must call back to this facility for assistance.  SEEK IMMEDIATE MEDICAL CARE IF:   · You are not getting better in 2 to 3 days.  · You have pain that is not controlled with medication.  · You feel worse instead of better.  · You cannot use the medication as directed.  · You develop swelling, redness or pain around the ear or stiffness in your neck.  MAKE SURE YOU:   · Understand these instructions.  · Will watch your condition.  · Will get help right away if you are not doing well or get worse.  Document Released: 04/23/2004 Document Revised: 07/08/2011 Document Reviewed: 02/23/2008  [employer]® Patient Information ©2012 [employer].

## 2011-11-01 NOTE — ED Provider Notes (Signed)
Medical screening examination/treatment/procedure(s) were performed by non-physician practitioner and as supervising physician I was immediately available for consultation/collaboration.   Gerhard Munch, MD 11/01/11 1348

## 2011-12-02 ENCOUNTER — Encounter (HOSPITAL_COMMUNITY): Payer: Self-pay | Admitting: Emergency Medicine

## 2011-12-02 ENCOUNTER — Emergency Department (HOSPITAL_COMMUNITY)
Admission: EM | Admit: 2011-12-02 | Discharge: 2011-12-02 | Disposition: A | Discharge status: Home / Self Care | Payer: Self-pay | Attending: Emergency Medicine | Admitting: Emergency Medicine

## 2011-12-02 DIAGNOSIS — H60399 Other infective otitis externa, unspecified ear: Secondary | ICD-10-CM | POA: Insufficient documentation

## 2011-12-02 DIAGNOSIS — H6090 Unspecified otitis externa, unspecified ear: Secondary | ICD-10-CM

## 2011-12-02 DIAGNOSIS — H6692 Otitis media, unspecified, left ear: Secondary | ICD-10-CM

## 2011-12-02 DIAGNOSIS — F17200 Nicotine dependence, unspecified, uncomplicated: Secondary | ICD-10-CM | POA: Insufficient documentation

## 2011-12-02 DIAGNOSIS — H6690 Otitis media, unspecified, unspecified ear: Secondary | ICD-10-CM | POA: Insufficient documentation

## 2011-12-02 MED ORDER — NAPROXEN 500 MG PO TABS: 500.0000 mg | ORAL_TABLET | Freq: Two times a day (BID) | ORAL | Status: DC

## 2011-12-02 MED ORDER — OXYCODONE-ACETAMINOPHEN 5-325 MG PO TABS: 1.0000 | ORAL_TABLET | ORAL | Status: AC | PRN

## 2011-12-02 MED ORDER — SULFAMETHOXAZOLE-TRIMETHOPRIM 800-160 MG PO TABS: 1.0000 | ORAL_TABLET | Freq: Two times a day (BID) | ORAL | Status: AC

## 2011-12-02 MED ORDER — CIPROFLOXACIN-DEXAMETHASONE 0.3-0.1 % OT SUSP: 4.0000 [drp] | Freq: Two times a day (BID) | OTIC | Status: AC

## 2011-12-02 MED ORDER — ANTIPYRINE-BENZOCAINE 5.4-1.4 % OT SOLN
3.0000 [drp] | Freq: Once | OTIC | Status: AC
  Administered 2011-12-02: 4 [drp] via OTIC
  Filled 2011-12-02: qty 10

## 2011-12-02 NOTE — Discharge Instructions (Signed)
You MUST call the doctor on call - listed above for ear problems.  Dr. Suszanne Conners can help you with this ear problem.  If you should develop severe or worsening pain, vomiting or fevers, return to the ER immediately.  Use the ear drops - 4 drops in the L ear for pain every 6 hours.  Use the antibiotic drops as prescribed.  Use the oral antibiotics and pain medicines as prescribed.

## 2011-12-02 NOTE — ED Notes (Signed)
PT. REPORTS LEFT EAR ACHE WITH DRAINAGE  RADIATING TO LEFT JAW FOR SEVERAL WEEKS , DENIES INJURY.

## 2011-12-02 NOTE — ED Notes (Signed)
Pt tolerated optic medicaton well d/c instruction e-Rx  And percocet Rx given along with antipyrine gtts to go and work excuse given.

## 2011-12-02 NOTE — ED Provider Notes (Signed)
History     CSN: [id number]  Arrival date & time 12/02/11  4098   First MD Initiated Contact with Patient 12/02/11 [phone]      Chief Complaint  Patient presents with  . Otalgia    (Consider location/radiation/quality/duration/timing/severity/associated sxs/prior treatment) HPI Comments: The patient is a 26 year old otherwise healthy male who presents with left ear pain. He states that this pain started greater than one month ago, he has been seen by physicians on more than one occasion for the same complaint and has had several different antibiotics prescribed for ongoing left otitis media. He states that over the last several days the pain has become worse, is now radiating to his left jaw it is not associated with fevers nausea or vomiting. The symptoms are persistent, gradually worsening. He has not seen an ear nose and throat specialist despite being referred at his last visit.  The history is provided by the patient.    History reviewed. No pertinent past medical history.  Past Surgical History  Procedure Date  . Cholecystectomy   . Hand surgery     No family history on file.  History  Substance Use Topics  . Smoking status: Current Everyday Smoker  . Smokeless tobacco: Not on file  . Alcohol Use: No      Review of Systems  Constitutional: Negative for fever and chills.  HENT: Positive for ear pain and tinnitus. Negative for sore throat, trouble swallowing and neck pain.   Gastrointestinal: Negative for nausea and vomiting.  Skin: Negative for rash.    Allergies  Amoxicillin  Home Medications   Current Outpatient Rx  Name Route Sig Dispense Refill  . OFLOXACIN 0.3 % OT SOLN Left Ear Place 5 drops into the left ear daily.    . TRAMADOL HCL 50 MG PO TABS Oral Take 50 mg by mouth every 6 (six) hours as needed. For pain    . CIPROFLOXACIN-DEXAMETHASONE 0.3-0.1 % OT SUSP Left Ear Place 4 drops into the left ear 2 (two) times daily. 7.5 mL 0  . NAPROXEN 500 MG PO  TABS Oral Take 1 tablet (500 mg total) by mouth 2 (two) times daily with a meal. 30 tablet 0  . OXYCODONE-ACETAMINOPHEN 5-325 MG PO TABS Oral Take 1 tablet by mouth every 4 (four) hours as needed for pain. May take 2 tablets PO q 6 hours for severe pain - Do not take with Tylenol as this tablet already contains tylenol 15 tablet 0  . SULFAMETHOXAZOLE-TRIMETHOPRIM 800-160 MG PO TABS Oral Take 1 tablet by mouth every 12 (twelve) hours. 20 tablet 0    BP 127/82  Pulse 98  Temp(Src) 98.3 F (36.8 C) (Oral)  Resp 16  SpO2 99%  Physical Exam  Nursing note and vitals reviewed. Constitutional: He appears well-developed and well-nourished. No distress.  HENT:  Head: Normocephalic and atraumatic.  Mouth/Throat: Oropharynx is clear and moist. No oropharyngeal exudate.       Dental Disease - but no significant fractures, cavities, swelling or abscesses of the jaw. He has no trismus and can open the jaw fully without difficulty. There is no swelling or tenderness to the mandible or maxillary areas. There is no tenderness over the mastoid and no lymphadenopathy around the ear.  The right membranes normal, the right external auditory canal is normal, the left tympanic membrane is bulging and opacified and erythematous, the left external auditory canal is full of disclamation, discharge and there is pain with manipulation of the auricle and tragus.  Eyes: Conjunctivae are normal. No scleral icterus.  Neck: Normal range of motion. Neck supple. No thyromegaly present.  Cardiovascular: Normal rate and regular rhythm.   Pulmonary/Chest: Effort normal and breath sounds normal.  Lymphadenopathy:    He has no cervical adenopathy.  Neurological: He is alert.  Skin: Skin is warm and dry. No rash noted. He is not diaphoretic.    ED Course  Procedures (including critical care time)  Labs Reviewed - No data to display No results found.   1. Otitis media, left   2. Otitis externa       MDM  The patient  does not have any signs of malignant otitis externa. He has a ongoing infection of his middle and external ear. I have prescribed him topical pain medication, oral pain medication, oral antibiotics and topical antibiotics and have referred him again to the ear nose and throat specialist.  Discharge Prescriptions include:  Auralgan ciprodex Percocet Naprosyn Bactrim        Vida Roller, MD 12/02/11 [phone]

## 2011-12-14 ENCOUNTER — Encounter (HOSPITAL_COMMUNITY): Payer: Self-pay | Admitting: *Deleted

## 2011-12-14 ENCOUNTER — Emergency Department (INDEPENDENT_AMBULATORY_CARE_PROVIDER_SITE_OTHER)
Admission: EM | Admit: 2011-12-14 | Discharge: 2011-12-14 | Disposition: A | Discharge status: Home / Self Care | Payer: Self-pay | Source: Home / Self Care | Attending: Family Medicine | Admitting: Family Medicine

## 2011-12-14 DIAGNOSIS — H60399 Other infective otitis externa, unspecified ear: Secondary | ICD-10-CM

## 2011-12-14 DIAGNOSIS — H6092 Unspecified otitis externa, left ear: Secondary | ICD-10-CM

## 2011-12-14 MED ORDER — CIPROFLOXACIN HCL 500 MG PO TABS: 500.0000 mg | ORAL_TABLET | Freq: Two times a day (BID) | ORAL | Status: AC

## 2011-12-14 MED ORDER — OXYCODONE-ACETAMINOPHEN 5-325 MG PO TABS: 1.0000 | ORAL_TABLET | ORAL | Status: AC | PRN

## 2011-12-14 NOTE — ED Provider Notes (Signed)
History     CSN: [id number]  Arrival date & time 12/14/11  Gary Hicks   First MD Initiated Contact with Patient 12/14/11 1906      Chief Complaint  Patient presents with  . Otalgia    (Consider location/radiation/quality/duration/timing/severity/associated sxs/prior treatment) Patient is a 26 y.o. male presenting with ear pain. The history is provided by the patient and the spouse.  Otalgia This is a recurrent problem. Episode onset: 3rd visit in 1.5 mos for same. recently finished septra. There has been no fever. The pain is moderate. Associated symptoms include ear discharge. His past medical history is significant for chronic ear infection.    History reviewed. No pertinent past medical history.  Past Surgical History  Procedure Date  . Cholecystectomy   . Hand surgery     No family history on file.  History  Substance Use Topics  . Smoking status: Current Everyday Smoker  . Smokeless tobacco: Not on file  . Alcohol Use: No      Review of Systems  Constitutional: Negative.   HENT: Positive for ear pain and ear discharge.     Allergies  Amoxicillin  Home Medications   Current Outpatient Rx  Name Route Sig Dispense Refill  . CIPROFLOXACIN HCL 500 MG PO TABS Oral Take 1 tablet (500 mg total) by mouth every 12 (twelve) hours. 14 tablet 0  . NAPROXEN 500 MG PO TABS Oral Take 1 tablet (500 mg total) by mouth 2 (two) times daily with a meal. 30 tablet 0  . OFLOXACIN 0.3 % OT SOLN Left Ear Place 5 drops into the left ear daily.    . OXYCODONE-ACETAMINOPHEN 5-325 MG PO TABS Oral Take 1 tablet by mouth every 4 (four) hours as needed for pain. 10 tablet 0  . TRAMADOL HCL 50 MG PO TABS Oral Take 50 mg by mouth every 6 (six) hours as needed. For pain      BP 120/72  Pulse 72  Temp 98.6 F (37 C)  Resp 16  SpO2 100%  Physical Exam  Nursing note and vitals reviewed. Constitutional: He appears well-developed and well-nourished.  HENT:  Head: Normocephalic.  Right  Ear: Tympanic membrane, external ear and ear canal normal.  Left Ear: There is drainage, swelling and tenderness.  Ears:  Nose: Nose normal.  Mouth/Throat: Oropharynx is clear and moist.    ED Course  Procedures (including critical care time)  Labs Reviewed - No data to display No results found.   1. External otitis of left ear       MDM  Ear irrigated til clear, wick and cortisporin gtts placed        Linna Hoff, MD 12/14/11 2148

## 2011-12-14 NOTE — Discharge Instructions (Signed)
Use 4 drops 3 times a [place] in ear, return on fri for wick removal, take medicine as prescribed

## 2011-12-14 NOTE — ED Notes (Signed)
PT  REPORTS  L  EARACHE      X  OVER  1  MONTH  THIS MAKES  3  RD  VISIT  FOR  SAME  -    HE  REPORTS  HE  HAS  AN  APPT  WITH  ENT     IN  SEV  WEEKS  -   HE  REPORTS  HE   JUST  FINISHED  ANTI  BIOTIC  COURSE    -  HE  IS  AWAKE  AS  WELL AS  ALERT AND  ORIENTED    HE  REPORTS THE  EAR IS  VERY TENDER  TO THE  [facility]

## 2012-04-01 ENCOUNTER — Emergency Department (HOSPITAL_COMMUNITY): Payer: Self-pay

## 2012-04-01 ENCOUNTER — Emergency Department (HOSPITAL_COMMUNITY)
Admission: EM | Admit: 2012-04-01 | Discharge: 2012-04-01 | Disposition: A | Discharge status: Home / Self Care | Payer: Self-pay | Attending: Emergency Medicine | Admitting: Emergency Medicine

## 2012-04-01 ENCOUNTER — Encounter (HOSPITAL_COMMUNITY): Payer: Self-pay | Admitting: *Deleted

## 2012-04-01 DIAGNOSIS — R079 Chest pain, unspecified: Secondary | ICD-10-CM | POA: Insufficient documentation

## 2012-04-01 DIAGNOSIS — J209 Acute bronchitis, unspecified: Secondary | ICD-10-CM | POA: Insufficient documentation

## 2012-04-01 DIAGNOSIS — Z9089 Acquired absence of other organs: Secondary | ICD-10-CM | POA: Insufficient documentation

## 2012-04-01 MED ORDER — AZITHROMYCIN 250 MG PO TABS: 250.0000 mg | ORAL_TABLET | Freq: Every day | ORAL | Status: AC

## 2012-04-01 MED ORDER — ALBUTEROL SULFATE HFA 108 (90 BASE) MCG/ACT IN AERS
2.0000 | INHALATION_SPRAY | Freq: Once | RESPIRATORY_TRACT | Status: AC
  Administered 2012-04-01: 2 via RESPIRATORY_TRACT
  Filled 2012-04-01: qty 6.7

## 2012-04-01 MED ORDER — AEROCHAMBER Z-STAT PLUS/MEDIUM MISC
Status: AC
  Administered 2012-04-01: 1
  Filled 2012-04-01: qty 1

## 2012-04-01 MED ORDER — HYDROCOD POLST-CHLORPHEN POLST 10-8 MG/5ML PO LQCR: 5.0000 mL | Freq: Two times a day (BID) | ORAL | Status: DC | PRN

## 2012-04-01 NOTE — ED Provider Notes (Cosign Needed)
History     CSN: [id number]  Arrival date & time 04/01/12  4098   First MD Initiated Contact with Patient 04/01/12 [phone]      Chief Complaint  Patient presents with  . Chest Pain  . Cough    dark phelgm    (Consider location/radiation/quality/duration/timing/severity/associated sxs/prior treatment) HPI Comments: Patient states "up all night coughing".  Reports discomfort in his chest with coughing that is productive of greenish sputum.  Patient is a smoker.  No history of asthma.  Denies fevers, chills, or ill contacts.    Patient is a 26 y.o. male presenting with chest pain. The history is provided by the patient.  Chest Pain The chest pain began yesterday. Duration of episode(s) is 1 [place]. Chest pain occurs constantly. The chest pain is unchanged. The pain is associated with breathing and coughing. The severity of the pain is moderate. The quality of the pain is described as pleuritic. The pain does not radiate. Chest pain is worsened by deep breathing (coughing). He tried nothing for the symptoms.     History reviewed. No pertinent past medical history.  Past Surgical History  Procedure Date  . Cholecystectomy   . Hand surgery     No family history on file.  History  Substance Use Topics  . Smoking status: Current Everyday Smoker  . Smokeless tobacco: Not on file  . Alcohol Use: No      Review of Systems  All other systems reviewed and are negative.    Allergies  Amoxicillin  Home Medications   Current Outpatient Rx  Name Route Sig Dispense Refill  . OVER THE COUNTER MEDICATION Oral Take 1 vial by mouth 2 (two) times daily as needed. Liquid cold medicine    . PHENOL 1.4 % MT LIQD Mouth/Throat Use as directed 1 spray in the mouth or throat as needed. For throat pain      BP 121/67  Pulse 101  Temp 99 F (37.2 C) (Oral)  Resp 22  SpO2 100%  Physical Exam  Nursing note and vitals reviewed. Constitutional: He is oriented to person, place, and time. He  appears well-developed and well-nourished. No distress.  HENT:  Head: Normocephalic and atraumatic.  Mouth/Throat: Oropharynx is clear and moist.       Bilateral tm's clear.   Neck: Normal range of motion. Neck supple.  Cardiovascular: Normal rate and regular rhythm.   No murmur heard. Pulmonary/Chest: Effort normal. No respiratory distress.       There are slight rhonchorous breath sounds bilaterally.  Abdominal: Soft. Bowel sounds are normal. He exhibits no distension.  Musculoskeletal: Normal range of motion. He exhibits no edema.  Neurological: He is alert and oriented to person, place, and time.  Skin: Skin is warm and dry. He is not diaphoretic.    ED Course  Procedures (including critical care time)   Labs Reviewed  RAPID STREP SCREEN   No results found.   No diagnosis found.   Date: 04/01/2012  Rate: 101  Rhythm: sinus tachycardia  QRS Axis: normal  Intervals: normal  ST/T Wave abnormalities: normal  Conduction Disutrbances:none  Narrative Interpretation:   Old EKG Reviewed: none available    MDM  The patient presents with chest congestion, cough for the past few days, worse last night.  Chest xray reveals signs of early copd.  I have advised him of this and urged smoking cessation.  He understands what I have told him.  He will be given an mdi, zithromax, and cough  syrup.  He is to return prn.        Geoffery Lyons, MD 04/01/12 [phone]

## 2012-04-01 NOTE — ED Notes (Signed)
Pt states coughing all nite and here with chest pain with coughing and hurts to take deep breath.  No cardiac history.  Pt is a smoker.  Pt states coughing up thick., dark sputum

## 2012-05-01 ENCOUNTER — Encounter (HOSPITAL_COMMUNITY): Payer: Self-pay

## 2012-05-01 ENCOUNTER — Emergency Department (HOSPITAL_COMMUNITY)
Admission: EM | Admit: 2012-05-01 | Discharge: 2012-05-01 | Disposition: A | Discharge status: Home / Self Care | Payer: Self-pay | Attending: Emergency Medicine | Admitting: Emergency Medicine

## 2012-05-01 DIAGNOSIS — M549 Dorsalgia, unspecified: Secondary | ICD-10-CM | POA: Insufficient documentation

## 2012-05-01 DIAGNOSIS — F17200 Nicotine dependence, unspecified, uncomplicated: Secondary | ICD-10-CM | POA: Insufficient documentation

## 2012-05-01 DIAGNOSIS — Z880 Allergy status to penicillin: Secondary | ICD-10-CM | POA: Insufficient documentation

## 2012-05-01 MED ORDER — MORPHINE SULFATE 4 MG/ML IJ SOLN
4.0000 mg | Freq: Once | INTRAMUSCULAR | Status: AC
  Administered 2012-05-01: 4 mg via INTRAMUSCULAR
  Filled 2012-05-01: qty 1

## 2012-05-01 MED ORDER — OXYCODONE-ACETAMINOPHEN 5-325 MG PO TABS: 1.0000 | ORAL_TABLET | ORAL | Status: DC | PRN
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
## 2012-05-01 NOTE — ED Notes (Signed)
Pt complains of back pain after lifitng metal off ground.

## 2012-05-03 ENCOUNTER — Emergency Department (HOSPITAL_COMMUNITY)
Admission: EM | Admit: 2012-05-03 | Discharge: 2012-05-03 | Disposition: A | Discharge status: Home / Self Care | Payer: Self-pay | Source: Home / Self Care

## 2012-05-06 ENCOUNTER — Emergency Department (HOSPITAL_COMMUNITY): Payer: Self-pay

## 2012-05-06 ENCOUNTER — Emergency Department (HOSPITAL_COMMUNITY)
Admission: EM | Admit: 2012-05-06 | Discharge: 2012-05-06 | Disposition: A | Discharge status: Home / Self Care | Payer: Self-pay | Attending: Emergency Medicine | Admitting: Emergency Medicine

## 2012-05-06 ENCOUNTER — Encounter (HOSPITAL_COMMUNITY): Payer: Self-pay | Admitting: Emergency Medicine

## 2012-05-06 DIAGNOSIS — IMO0002 Reserved for concepts with insufficient information to code with codable children: Secondary | ICD-10-CM | POA: Insufficient documentation

## 2012-05-06 DIAGNOSIS — M5416 Radiculopathy, lumbar region: Secondary | ICD-10-CM

## 2012-05-06 DIAGNOSIS — X500XXA Overexertion from strenuous movement or load, initial encounter: Secondary | ICD-10-CM | POA: Insufficient documentation

## 2012-05-06 DIAGNOSIS — F17200 Nicotine dependence, unspecified, uncomplicated: Secondary | ICD-10-CM | POA: Insufficient documentation

## 2012-05-06 MED ORDER — PREDNISONE 20 MG PO TABS
60.0000 mg | ORAL_TABLET | Freq: Once | ORAL | Status: DC
  Filled 2012-05-06: qty 3

## 2012-05-06 MED ORDER — HYDROCODONE-ACETAMINOPHEN 5-500 MG PO TABS: 1.0000 | ORAL_TABLET | Freq: Four times a day (QID) | ORAL | Status: DC | PRN

## 2012-05-06 MED ORDER — KETOROLAC TROMETHAMINE 60 MG/2ML IM SOLN
60.0000 mg | Freq: Once | INTRAMUSCULAR | Status: AC
  Administered 2012-05-06: 60 mg via INTRAMUSCULAR
  Filled 2012-05-06: qty 2

## 2012-05-06 MED ORDER — NAPROXEN 500 MG PO TABS: 500.0000 mg | ORAL_TABLET | Freq: Two times a day (BID) | ORAL | Status: DC

## 2012-05-06 MED ORDER — OXYCODONE-ACETAMINOPHEN 5-325 MG PO TABS
1.0000 | ORAL_TABLET | Freq: Once | ORAL | Status: AC
  Administered 2012-05-06: 1 via ORAL
  Filled 2012-05-06: qty 1

## 2012-05-06 NOTE — ED Provider Notes (Signed)
History     CSN: [id number]  Arrival date & time 05/06/12  3086   First MD Initiated Contact with Patient 05/06/12 [phone]      Chief Complaint  Patient presents with  . Back Pain    (Consider location/radiation/quality/duration/timing/severity/associated sxs/prior treatment) Patient is a 26 y.o. male presenting with back pain. The history is provided by the patient.  Back Pain  This is a new problem. The current episode started more than 2 days ago. The problem occurs constantly. The pain is associated with lifting heavy objects. The pain is present in the lumbar spine. The quality of the pain is described as aching. The pain radiates to the left foot and right foot. The pain is at a severity of 7/10. The pain is moderate. Associated symptoms include numbness, paresis and tingling. Pertinent negatives include no fever, no bowel incontinence, no perianal numbness, no bladder incontinence, no dysuria and no weakness. He has tried NSAIDs and analgesics for the symptoms.  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 States since then he has had severe pain in lower back radiating down both legs, right worse than left. States toes tingling. Feel numb. States pain with any movement, walking. Was seen here few days ago, given percocet that he ran out of. Taking tylenol and motrin now with no relief. [name]  History reviewed. No pertinent past medical history.  Past Surgical History  Procedure Date  . Cholecystectomy   . Hand surgery     No family history on file.  History  Substance Use Topics  . Smoking status: Current Every [place] Smoker  . Smokeless tobacco: Not on file  . Alcohol Use: No      Review of Systems  Constitutional: Negative for fever and chills.  HENT: Negative for neck pain and neck stiffness.   Respiratory: Negative.   Cardiovascular: Negative.   Gastrointestinal: Negative.  Negative for bowel incontinence.  Genitourinary: Negative for  bladder incontinence, dysuria and difficulty urinating.  Musculoskeletal: Positive for back pain and gait problem.  Skin: Negative.   Neurological: Positive for tingling and numbness. Negative for weakness.  Hematological: Negative.     Allergies  Amoxicillin  Home Medications   Current Outpatient Rx  Name Route Sig Dispense Refill  . ACETAMINOPHEN 500 MG PO TABS Oral Take 1,000 mg by mouth every 4 (four) hours as needed. For pain    . IBUPROFEN 200 MG PO TABS Oral Take 400-600 mg by mouth every 6 (six) hours as needed. For pain    . OXYCODONE-ACETAMINOPHEN 5-325 MG PO TABS Oral Take 1 tablet by mouth every 4 (four) hours as needed for pain. 20 tablet 0    BP 132/72  Pulse 89  Temp 98.4 F (36.9 C) (Oral)  Resp 18  SpO2 100%  Physical Exam  Nursing note and vitals reviewed. Constitutional: He is oriented to person, place, and time. He appears well-developed and well-nourished. No distress.  HENT:  Head: Normocephalic.  Eyes: Conjunctivae normal are normal.  Neck: Neck supple.  Cardiovascular: Normal rate, regular rhythm and normal heart sounds.   Pulmonary/Chest: Effort normal and breath sounds normal. No respiratory distress. He has no wheezes. He has no rales.  Musculoskeletal:       Midline lumber spine tenderness and left SI joint tenderness. Pain with bilateral straight leg raise.   Neurological: He is alert and oriented to person, place, and time.       5/5 and  equal LE strength, pt able to dorsiflex bilateral feet and great toes, 5/5 strength. 2+ and equal patellar reflexes. Sensation of thighs and feet intact.   Skin: Skin is warm and dry.  Psychiatric: He has a normal mood and affect.    ED Course  Procedures (including critical care time)  Pt with lower back pain radiating down both legs. No urine retention/incontinence. No problems with bowels. No fever. Denies iv drug use. Denies abdominal pain. Exam normal with no neurodeficits. Will treat with nsaids, pain  meds. Pt has an appointment on Thursday with Dr. Turner Daniels? Orthopedics. He has no signs of cauda equina or nerve compression. D/c home.   1. Lumbar radiculopathy       MDM          Lottie Mussel, PA 05/06/12 1105

## 2012-05-06 NOTE — ED Notes (Signed)
Pt c/o low back pain. Pt seen here Monday for same. Reports pain continues. Pt reports pain radiates to legs and up back.

## 2012-05-07 NOTE — ED Provider Notes (Signed)
Medical screening examination/treatment/procedure(s) were performed by non-physician practitioner and as supervising physician I was immediately available for consultation/collaboration.   Geneveive Furness, MD 05/07/12 0731 

## 2012-05-16 ENCOUNTER — Emergency Department (HOSPITAL_COMMUNITY)
Admission: EM | Admit: 2012-05-16 | Discharge: 2012-05-16 | Disposition: A | Discharge status: Home / Self Care | Payer: Self-pay | Attending: Emergency Medicine | Admitting: Emergency Medicine

## 2012-05-16 ENCOUNTER — Emergency Department (HOSPITAL_COMMUNITY): Payer: Self-pay

## 2012-05-16 ENCOUNTER — Encounter (HOSPITAL_COMMUNITY): Payer: Self-pay

## 2012-05-16 DIAGNOSIS — R42 Dizziness and giddiness: Secondary | ICD-10-CM | POA: Insufficient documentation

## 2012-05-16 DIAGNOSIS — R1110 Vomiting, unspecified: Secondary | ICD-10-CM | POA: Insufficient documentation

## 2012-05-16 DIAGNOSIS — K5289 Other specified noninfective gastroenteritis and colitis: Secondary | ICD-10-CM | POA: Insufficient documentation

## 2012-05-16 DIAGNOSIS — K529 Noninfective gastroenteritis and colitis, unspecified: Secondary | ICD-10-CM

## 2012-05-16 LAB — COMPREHENSIVE METABOLIC PANEL
Alkaline Phosphatase: 74 U/L (ref 39–117)
BUN: 13 mg/dL (ref 6–23)
Calcium: 9.4 mg/dL (ref 8.4–10.5)
GFR calc Af Amer: >90 mL/min (ref >90)
Glucose, Bld: 103 mg/dL — ABNORMAL HIGH (ref 70–99)
Potassium: 3.7 mEq/L (ref 3.5–5.1)
Total Protein: 7.4 g/dL (ref 6.0–8.3)

## 2012-05-16 LAB — CBC WITH DIFFERENTIAL/PLATELET
Eosinophils Absolute: 0.1 10*3/uL (ref 0.0–0.7)
Eosinophils Relative: 1 % (ref 0–5)
Hemoglobin: 13.2 g/dL (ref 13.0–17.0)
Lymphs Abs: 1.8 10*3/uL (ref 0.7–4.0)
MCH: 32 pg (ref 26.0–34.0)
MCHC: 35.2 g/dL (ref 30.0–36.0)
MCV: 90.8 fL (ref 78.0–100.0)
Monocytes Absolute: 0.6 10*3/uL (ref 0.1–1.0)
Monocytes Relative: 8 % (ref 3–12)
RBC: 4.13 MIL/uL — ABNORMAL LOW (ref 4.22–5.81)

## 2012-05-16 LAB — URINALYSIS, ROUTINE W REFLEX MICROSCOPIC
Bilirubin Urine: NEGATIVE
Nitrite: NEGATIVE
Specific Gravity, Urine: 1.015 (ref 1.005–1.030)
Urobilinogen, UA: 0.2 mg/dL (ref 0.0–1.0)

## 2012-05-16 LAB — LIPASE, BLOOD: Lipase: 21 U/L (ref 11–59)

## 2012-05-16 MED ORDER — PROMETHAZINE HCL 25 MG PO TABS: 25.0000 mg | ORAL_TABLET | Freq: Four times a day (QID) | ORAL | Status: DC | PRN

## 2012-05-16 MED ORDER — ONDANSETRON HCL 4 MG/2ML IJ SOLN
4.0000 mg | Freq: Once | INTRAMUSCULAR | Status: AC
  Administered 2012-05-16: 4 mg via INTRAVENOUS
  Filled 2012-05-16: qty 2

## 2012-05-16 MED ORDER — SODIUM CHLORIDE 0.9 % IV SOLN: Freq: Once | INTRAVENOUS | Status: DC

## 2012-05-16 MED ORDER — KETOROLAC TROMETHAMINE 30 MG/ML IJ SOLN
30.0000 mg | Freq: Once | INTRAMUSCULAR | Status: AC
  Administered 2012-05-16: 30 mg via INTRAVENOUS
  Filled 2012-05-16: qty 1

## 2012-05-16 MED ORDER — SODIUM CHLORIDE 0.9 % IV BOLUS (SEPSIS)
1000.0000 mL | Freq: Once | INTRAVENOUS | Status: AC
  Administered 2012-05-16: 1000 mL via INTRAVENOUS

## 2012-05-16 NOTE — ED Notes (Signed)
Diarrhea, headache. Body aches.

## 2012-05-16 NOTE — ED Notes (Signed)
Patient transported to X-ray 

## 2012-05-16 NOTE — ED Notes (Signed)
Pt returned to POD B 18 from X-ray.

## 2012-05-16 NOTE — ED Provider Notes (Signed)
History     CSN: [id number]  Arrival date & time 05/16/12  4098   First MD Initiated Contact with Patient 05/16/12 0800      Chief Complaint  Patient presents with  . Fever  . Generalized Body Aches  . Emesis    (Consider location/radiation/quality/duration/timing/severity/associated sxs/prior treatment) HPI Comments: Gary Hicks is a 27 y.o. Male who presents with complaint of nausea, vomiting, abdominal pain, weakness, body aches, fever up to 103. Pt states symptoms started 2 days ago. States unable to keep anything down, however, is taking tylenol and ibuprofen for fever, last dose just prior to the arrival. States diarrhea watery, multiple episodes a Eugene. States unable to sleep due to body aches, states night sweats as well. Denies cough, congestion, sore throat, urinary symptoms. Admits to headache, no neck stiffness.    History reviewed. No pertinent past medical history.  Past Surgical History  Procedure Date  . Cholecystectomy   . Hand surgery     History reviewed. No pertinent family history.  History  Substance Use Topics  . Smoking status: Current Every [place]son Smoker -- 0.5 packs/Dubose    Types: Cigarettes  . Smokeless tobacco: Current User  . Alcohol Use: No      Review of Systems  Constitutional: Positive for fever, chills and fatigue.  HENT: Negative for ear pain, congestion, sore throat, neck pain and neck stiffness.   Eyes: Negative for visual disturbance.  Respiratory: Negative.  Negative for cough, chest tightness and shortness of breath.   Cardiovascular: Negative.   Gastrointestinal: Positive for nausea, vomiting, abdominal pain and diarrhea.  Genitourinary: Negative for dysuria and flank pain.  Musculoskeletal: Positive for myalgias.  Skin: Negative.   Neurological: Positive for dizziness, weakness and headaches.  Psychiatric/Behavioral: Negative.     Allergies  Prednisone and Amoxicillin  Home Medications   Current Outpatient Rx  Name  Route Sig Dispense Refill  . ACETAMINOPHEN 500 MG PO TABS Oral Take 1,000 mg by mouth every 4 (four) hours as needed. For pain    . IBUPROFEN 200 MG PO TABS Oral Take 400-600 mg by mouth every 6 (six) hours as needed. For pain      BP 130/76  Pulse 108  Temp 97.9 F (36.6 C) (Oral)  Resp 18  Ht 5\' 9"  (1.753 m)  Wt 130 lb (58.968 kg)  BMI 19.20 kg/m2  SpO2 100%  Physical Exam  Nursing note and vitals reviewed. Constitutional: He appears well-developed and well-nourished. No distress.  Eyes: Conjunctivae normal are normal.  Neck: Neck supple.  Cardiovascular: Normal rate, regular rhythm and normal heart sounds.   Pulmonary/Chest: Effort normal and breath sounds normal. No respiratory distress. He has no wheezes. He has no rales.  Abdominal: Soft. Bowel sounds are normal. There is tenderness. There is no rebound and no guarding.       Epigastric tenderness, RUQ tenderness, no guarding, no rebound tenderness  Musculoskeletal: He exhibits no edema.  Neurological: He is alert.  Skin: Skin is warm and dry.  Psychiatric: He has a normal mood and affect.    ED Course  Procedures (including critical care time) Pt with nausea, vomiting, diarrhea, body aches. VS stable other than tachycardia. He appears dry. Will start fluids. Labs pending.   Results for orders placed during the hospital encounter of 05/16/12  CBC WITH DIFFERENTIAL      Component Value Range   WBC 8.3  4.0 - 10.5 K/uL   RBC 4.13 (*) 4.22 - 5.81 MIL/uL  Hemoglobin 13.2  13.0 - 17.0 g/dL   HCT 16.1 (*) 09.6 - 04.5 %   MCV 90.8  78.0 - 100.0 fL   MCH 32.0  26.0 - 34.0 pg   MCHC 35.2  30.0 - 36.0 g/dL   RDW 40.9  81.1 - 91.4 %   Platelets 188  150 - 400 K/uL   Neutrophils Relative 70  43 - 77 %   Neutro Abs 5.8  1.7 - 7.7 K/uL   Lymphocytes Relative 22  12 - 46 %   Lymphs Abs 1.8  0.7 - 4.0 K/uL   Monocytes Relative 8  3 - 12 %   Monocytes Absolute 0.6  0.1 - 1.0 K/uL   Eosinophils Relative 1  0 - 5 %    Eosinophils Absolute 0.1  0.0 - 0.7 K/uL   Basophils Relative 0  0 - 1 %   Basophils Absolute 0.0  0.0 - 0.1 K/uL  COMPREHENSIVE METABOLIC PANEL      Component Value Range   Sodium 134 (*) 135 - 145 mEq/L   Potassium 3.7  3.5 - 5.1 mEq/L   Chloride 99  96 - 112 mEq/L   CO2 26  19 - 32 mEq/L   Glucose, Bld 103 (*) 70 - 99 mg/dL   BUN 13  6 - 23 mg/dL   Creatinine, Ser 7.82  0.50 - 1.35 mg/dL   Calcium 9.4  8.4 - 95.6 mg/dL   Total Protein 7.4  6.0 - 8.3 g/dL   Albumin 3.8  3.5 - 5.2 g/dL   AST 13  0 - 37 U/L   ALT 15  0 - 53 U/L   Alkaline Phosphatase 74  39 - 117 U/L   Total Bilirubin 0.4  0.3 - 1.2 mg/dL   GFR calc non Af Amer >90  >90 mL/min   GFR calc Af Amer >90  >90 mL/min  LIPASE, BLOOD      Component Value Range   Lipase 21  11 - 59 U/L  URINALYSIS, ROUTINE W REFLEX MICROSCOPIC      Component Value Range   Color, Urine YELLOW  YELLOW   APPearance CLOUDY (*) CLEAR   Specific Gravity, Urine 1.015  1.005 - 1.030   pH 7.0  5.0 - 8.0   Glucose, UA NEGATIVE  NEGATIVE mg/dL   Hgb urine dipstick NEGATIVE  NEGATIVE   Bilirubin Urine NEGATIVE  NEGATIVE   Ketones, ur NEGATIVE  NEGATIVE mg/dL   Protein, ur NEGATIVE  NEGATIVE mg/dL   Urobilinogen, UA 0.2  0.0 - 1.0 mg/dL   Nitrite NEGATIVE  NEGATIVE   Leukocytes, UA NEGATIVE  NEGATIVE    Dg Abd Acute W/chest  05/16/2012  *RADIOLOGY REPORT*  Clinical Data: Abdominal pain, vomiting.  ACUTE ABDOMEN SERIES (ABDOMEN 2 VIEW & CHEST 1 VIEW)  Comparison: 04/01/2012 chest radiograph, 05/27/2009 CT abdomen pelvis  Findings: Minimal linear opacity within the right lower lobe is likely atelectasis or scarring.  Lungs are otherwise clear. Cardiomediastinal contours are within normal range.  No free intraperitoneal air.  Surgical clips project over the right upper quadrant. The bowel gas pattern is non-obstructive. Organ outlines are normal where seen.  Nonaggressive sclerotic focus along the left ischium. No acute or aggressive osseous  abnormality identified.  IMPRESSION: Nonobstructive bowel gas pattern.   Original Report Authenticated By: Waneta Martins, M.D.    11:26 AM Pt received 2L of NS, HE improved, and VS are now all within normal, he is afebrile. Pt's is not  vomiting. Tolerating POs. Abdomen soft, non tender. Suspect gastroentirits. Nausea meds at home, clear liquids, follow up as needed. Return if worsening.   Filed Vitals:   05/16/12 0751 05/16/12 0845 05/16/12 1015 05/16/12 1118  BP: 130/76  106/66   Pulse: 108 78 88   Temp: 97.9 F (36.6 C)   98.4 F (36.9 C)  TempSrc: Oral   Oral  Resp: 18 16 14    Height: 5\' 9"  (1.753 m)     Weight: 130 lb (58.968 kg)     SpO2: 100% 99% 100%       1. Gastroenteritis       MDM          Lottie Mussel, PA 05/16/12 [phone]

## 2012-05-17 NOTE — ED Provider Notes (Signed)
Medical screening examination/treatment/procedure(s) were performed by non-physician practitioner and as supervising physician I was immediately available for consultation/collaboration.   Dione Booze, MD 05/17/12 [phone]

## 2012-09-12 ENCOUNTER — Emergency Department (HOSPITAL_COMMUNITY)
Admission: EM | Admit: 2012-09-12 | Discharge: 2012-09-12 | Disposition: A | Discharge status: Home / Self Care | Payer: PRIVATE HEALTH INSURANCE | Attending: Emergency Medicine | Admitting: Emergency Medicine

## 2012-09-12 ENCOUNTER — Encounter (HOSPITAL_COMMUNITY): Payer: Self-pay | Admitting: Physical Medicine and Rehabilitation

## 2012-09-12 ENCOUNTER — Emergency Department (HOSPITAL_COMMUNITY): Payer: PRIVATE HEALTH INSURANCE

## 2012-09-12 DIAGNOSIS — Y92410 Unspecified street and highway as the place of occurrence of the external cause: Secondary | ICD-10-CM | POA: Insufficient documentation

## 2012-09-12 DIAGNOSIS — M542 Cervicalgia: Secondary | ICD-10-CM

## 2012-09-12 DIAGNOSIS — S0993XA Unspecified injury of face, initial encounter: Secondary | ICD-10-CM | POA: Insufficient documentation

## 2012-09-12 DIAGNOSIS — S24101A Unspecified injury at T1 level of thoracic spinal cord, initial encounter: Secondary | ICD-10-CM | POA: Insufficient documentation

## 2012-09-12 DIAGNOSIS — Y9389 Activity, other specified: Secondary | ICD-10-CM | POA: Insufficient documentation

## 2012-09-12 DIAGNOSIS — S99929A Unspecified injury of unspecified foot, initial encounter: Secondary | ICD-10-CM | POA: Insufficient documentation

## 2012-09-12 DIAGNOSIS — M546 Pain in thoracic spine: Secondary | ICD-10-CM

## 2012-09-12 DIAGNOSIS — F17200 Nicotine dependence, unspecified, uncomplicated: Secondary | ICD-10-CM | POA: Insufficient documentation

## 2012-09-12 DIAGNOSIS — S8990XA Unspecified injury of unspecified lower leg, initial encounter: Secondary | ICD-10-CM | POA: Insufficient documentation

## 2012-09-12 MED ORDER — HYDROCODONE-ACETAMINOPHEN 5-325 MG PO TABS: 1.0000 | ORAL_TABLET | Freq: Four times a day (QID) | ORAL | Status: DC | PRN

## 2012-09-12 MED ORDER — NAPROXEN 500 MG PO TABS: 500.0000 mg | ORAL_TABLET | Freq: Two times a day (BID) | ORAL | Status: DC

## 2012-09-12 MED ORDER — OXYCODONE-ACETAMINOPHEN 5-325 MG PO TABS
1.0000 | ORAL_TABLET | Freq: Once | ORAL | Status: AC
  Administered 2012-09-12: 1 via ORAL
  Filled 2012-09-12: qty 1
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
## 2012-09-12 NOTE — ED Notes (Signed)
Pt taken to Xray; Ticket to ride given to transporter, [employer]

## 2012-09-13 NOTE — ED Provider Notes (Signed)
Medical screening examination/treatment/procedure(s) were performed by non-physician practitioner and as supervising physician I was immediately available for consultation/collaboration.  Gilda Crease, MD 09/13/12 2300

## 2012-10-30 ENCOUNTER — Encounter (HOSPITAL_COMMUNITY): Payer: Self-pay | Admitting: *Deleted

## 2012-10-30 ENCOUNTER — Emergency Department (HOSPITAL_COMMUNITY): Payer: Self-pay

## 2012-10-30 ENCOUNTER — Emergency Department (HOSPITAL_COMMUNITY)
Admission: EM | Admit: 2012-10-30 | Discharge: 2012-10-30 | Disposition: A | Discharge status: Home / Self Care | Payer: Self-pay | Attending: Emergency Medicine | Admitting: Emergency Medicine

## 2012-10-30 DIAGNOSIS — F17200 Nicotine dependence, unspecified, uncomplicated: Secondary | ICD-10-CM | POA: Insufficient documentation

## 2012-10-30 DIAGNOSIS — R209 Unspecified disturbances of skin sensation: Secondary | ICD-10-CM | POA: Insufficient documentation

## 2012-10-30 DIAGNOSIS — Y929 Unspecified place or not applicable: Secondary | ICD-10-CM | POA: Insufficient documentation

## 2012-10-30 DIAGNOSIS — Y9389 Activity, other specified: Secondary | ICD-10-CM | POA: Insufficient documentation

## 2012-10-30 DIAGNOSIS — W2209XA Striking against other stationary object, initial encounter: Secondary | ICD-10-CM | POA: Insufficient documentation

## 2012-10-30 DIAGNOSIS — S60221A Contusion of right hand, initial encounter: Secondary | ICD-10-CM

## 2012-10-30 MED ORDER — HYDROCODONE-ACETAMINOPHEN 5-325 MG PO TABS: 1.0000 | ORAL_TABLET | Freq: Four times a day (QID) | ORAL | Status: DC | PRN

## 2012-10-30 MED ORDER — HYDROCODONE-ACETAMINOPHEN 5-325 MG PO TABS
2.0000 | ORAL_TABLET | Freq: Once | ORAL | Status: AC
  Administered 2012-10-30: 2 via ORAL
  Filled 2012-10-30: qty 2
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
## 2012-10-30 NOTE — ED Notes (Signed)
Pt punched a wall. Swelling and redness noted right hand. Coolness noted in digits of right hand. Numbness in digit three sensation present in rest of hand.  Cap refill less than 3 seconds.

## 2012-10-30 NOTE — ED Notes (Signed)
The patient is AOx4 and comfortable with his discharge instructions.  His father is driving him home.

## 2013-02-03 ENCOUNTER — Encounter (HOSPITAL_COMMUNITY): Payer: Self-pay | Admitting: *Deleted

## 2013-02-03 ENCOUNTER — Emergency Department (HOSPITAL_COMMUNITY)
Admission: EM | Admit: 2013-02-03 | Discharge: 2013-02-04 | Disposition: A | Discharge status: Home / Self Care | Payer: Self-pay | Attending: Emergency Medicine | Admitting: Emergency Medicine

## 2013-02-03 DIAGNOSIS — J40 Bronchitis, not specified as acute or chronic: Secondary | ICD-10-CM | POA: Insufficient documentation

## 2013-02-03 MED ORDER — ALBUTEROL SULFATE HFA 108 (90 BASE) MCG/ACT IN AERS
2.0000 | INHALATION_SPRAY | RESPIRATORY_TRACT | Status: DC | PRN
  Administered 2013-02-04: 2 via RESPIRATORY_TRACT
  Filled 2013-02-03: qty 6.7

## 2013-02-03 MED ORDER — HYDROCOD POLST-CHLORPHEN POLST 10-8 MG/5ML PO LQCR
5.0000 mL | Freq: Once | ORAL | Status: AC
  Administered 2013-02-04: 5 mL via ORAL
  Filled 2013-02-03: qty 5

## 2013-02-03 NOTE — ED Notes (Signed)
Pt took 2 tylenol approx 1 hour ago

## 2013-02-03 NOTE — ED Notes (Addendum)
Pt c/o, N/V, generalized body aches, and fever since Friday. Pt has self administered Tylenol a x 2 and aleve x 1.

## 2013-02-03 NOTE — ED Provider Notes (Signed)
History    CSN: [id number] Arrival date & time 02/03/13  2233  First MD Initiated Contact with Patient 02/03/13 2315     Chief Complaint  Patient presents with  . Influenza   HPI  History provided by the patient. Patient is a 27 year old male with history of cholecystectomy who presents with complaints of generalized body aches, fever, chills, sore throat and cough. Symptoms started yesterday with slight sore throat and body aches. He also had some subjective chills and fever.  Today he reports fever of 103.  He took tylenol and aleve with some relief but continues to have body aches.  Reports that his young children and family members have been sick recently. Patient's reports cough is sometimes productive and has had small streaks of blood. He is a current every [place] smoker. He states he has been trying to reduce the amount he smokes. Denies any other significant past medical history. No history of asthma. Denies any severe headache, neck pain or stiffness. No episodes of nausea, vomiting or diarrhea. No other aggravating or alleviating factors. No other associated symptoms.    History reviewed. No pertinent past medical history. Past Surgical History  Procedure Laterality Date  . Cholecystectomy    . Hand surgery     No family history on file. History  Substance Use Topics  . Smoking status: Current Every [place] Smoker -- 0.50 packs/Fritts    Types: Cigarettes  . Smokeless tobacco: Current User  . Alcohol Use: No    Review of Systems  Constitutional: Positive for fever, chills and diaphoresis.  HENT: Positive for sore throat. Negative for congestion, rhinorrhea and neck pain.   Respiratory: Positive for cough.   Cardiovascular: Negative for chest pain.  Gastrointestinal: Negative for nausea, vomiting, abdominal pain, diarrhea and constipation.  Musculoskeletal: Positive for myalgias and back pain.  Neurological: Negative for headaches.  All other systems reviewed and are  negative.    Allergies  Prednisone and Amoxicillin  Home Medications   Current Outpatient Rx  Name  Route  Sig  Dispense  Refill  . acetaminophen (TYLENOL) 325 MG tablet   Oral   Take 650 mg by mouth every 6 (six) hours as needed for pain.         . naproxen sodium (ANAPROX) 220 MG tablet   Oral   Take 220 mg by mouth 2 (two) times daily as needed (pain).          BP 120/75  Pulse 105  Temp(Src) 101 F (38.3 C) (Oral)  Resp 20  SpO2 99% Physical Exam  Nursing note and vitals reviewed. Constitutional: He is oriented to person, place, and time. He appears well-developed and well-nourished. No distress.  HENT:  Head: Normocephalic and atraumatic.  Right Ear: Tympanic membrane normal.  Left Ear: Tympanic membrane normal.  Mouth/Throat: Oropharynx is clear and moist.  Eyes: Conjunctivae are normal.  Neck: Normal range of motion. Neck supple.  No meningeal signs  Cardiovascular: Normal rate and regular rhythm.   No murmur heard. Pulmonary/Chest: Effort normal. No respiratory distress. He has wheezes. He has no rales. He exhibits no tenderness.  Abdominal: Soft.  Musculoskeletal: Normal range of motion. He exhibits no edema and no tenderness.  Neurological: He is alert and oriented to person, place, and time.  Skin: Skin is warm. No rash noted. No erythema.  Psychiatric: He has a normal mood and affect. His behavior is normal.    ED Course  Procedures     Dg Chest 2  View  02/04/2013   *RADIOLOGY REPORT*  Clinical Data: Fever, cough, nausea, vomiting, and generalized body aches for 1 Mapp, history smoking  CHEST - 2 VIEW  Comparison: 04/01/2012  Findings: Normal heart size, mediastinal contours, and pulmonary vascularity. Minimal chronic peribronchial thickening. Lungs otherwise clear. No pleural effusion or pneumothorax. Bones unremarkable.  IMPRESSION: No acute abnormalities.   Original Report Authenticated By: Ulyses Southward, M.D.      1. Bronchitis     MDM   11:30 PM patient seen and evaluated. The patient appears well in no acute distress. Patient does appear severely ill or toxic.  X-ray without concerning findings. No significant signs for pneumonia.   Angus Seller, PA-C 02/04/13 [id number]

## 2013-02-04 ENCOUNTER — Emergency Department (HOSPITAL_COMMUNITY): Payer: Self-pay

## 2013-02-04 MED ORDER — AZITHROMYCIN 250 MG PO TABS: ORAL_TABLET | ORAL | Status: DC

## 2013-02-04 MED ORDER — HYDROCOD POLST-CHLORPHEN POLST 10-8 MG/5ML PO LQCR: 5.0000 mL | Freq: Two times a day (BID) | ORAL | Status: DC

## 2013-02-04 NOTE — ED Provider Notes (Signed)
Medical screening examination/treatment/procedure(s) were performed by non-physician practitioner and as supervising physician I was immediately available for consultation/collaboration.   Charles B. Sheldon, MD 02/04/13 0500 

## 2013-02-10 ENCOUNTER — Encounter (HOSPITAL_COMMUNITY): Payer: Self-pay | Admitting: Adult Health

## 2013-02-10 ENCOUNTER — Emergency Department (HOSPITAL_COMMUNITY)
Admission: EM | Admit: 2013-02-10 | Discharge: 2013-02-10 | Discharge status: Against Medical Advice | Payer: Self-pay | Attending: Emergency Medicine | Admitting: Emergency Medicine

## 2013-02-10 DIAGNOSIS — R197 Diarrhea, unspecified: Secondary | ICD-10-CM | POA: Insufficient documentation

## 2013-02-10 DIAGNOSIS — J3489 Other specified disorders of nose and nasal sinuses: Secondary | ICD-10-CM | POA: Insufficient documentation

## 2013-02-10 DIAGNOSIS — F17200 Nicotine dependence, unspecified, uncomplicated: Secondary | ICD-10-CM | POA: Insufficient documentation

## 2013-02-10 DIAGNOSIS — F1123 Opioid dependence with withdrawal: Secondary | ICD-10-CM

## 2013-02-10 DIAGNOSIS — R112 Nausea with vomiting, unspecified: Secondary | ICD-10-CM | POA: Insufficient documentation

## 2013-02-10 DIAGNOSIS — R5381 Other malaise: Secondary | ICD-10-CM | POA: Insufficient documentation

## 2013-02-10 DIAGNOSIS — F19939 Other psychoactive substance use, unspecified with withdrawal, unspecified: Secondary | ICD-10-CM | POA: Insufficient documentation

## 2013-02-10 MED ORDER — LOPERAMIDE HCL 2 MG PO CAPS
4.0000 mg | ORAL_CAPSULE | Freq: Once | ORAL | Status: DC
  Filled 2013-02-10: qty 2

## 2013-02-10 MED ORDER — CLONIDINE HCL 0.2 MG PO TABS
0.2000 mg | ORAL_TABLET | Freq: Once | ORAL | Status: AC
  Administered 2013-02-10: 0.2 mg via ORAL
  Filled 2013-02-10: qty 1

## 2013-02-10 MED ORDER — DIAZEPAM 5 MG PO TABS
5.0000 mg | ORAL_TABLET | Freq: Once | ORAL | Status: DC
  Filled 2013-02-10: qty 1

## 2013-02-10 MED ORDER — PROCHLORPERAZINE MALEATE 10 MG PO TABS
10.0000 mg | ORAL_TABLET | Freq: Once | ORAL | Status: AC
  Administered 2013-02-10: 10 mg via ORAL
  Filled 2013-02-10: qty 1

## 2013-02-10 MED ORDER — IBUPROFEN 800 MG PO TABS
800.0000 mg | ORAL_TABLET | Freq: Once | ORAL | Status: AC
  Administered 2013-02-10: 800 mg via ORAL
  Filled 2013-02-10: qty 1

## 2013-02-10 NOTE — ED Notes (Addendum)
Presents with IV opiate withdrawal that began weds night Last opiate use Weds morning. Reports nausea, body aches, cold sweats and shakiness. Pt was on Methadone but has been unable to afford it. He is alert and oreinted, answering all questions appropriately.  He is not requesting in patient detox, he is wanting to get his symptoms under control at this moment.

## 2013-02-10 NOTE — ED Notes (Signed)
Patient eloped at this time.

## 2013-02-10 NOTE — ED Provider Notes (Signed)
History    CSN: [id number] Arrival date & time 02/10/13  0241  First MD Initiated Contact with Patient 02/10/13 0258     Chief Complaint  Patient presents with  . Withdrawal   HPI Gary Hicks is a 27 y.o. male who usually uses $60-$80 of heroin daily, has not used heroin 2 days and now complains about withdrawal symptoms. Patient has generalized body aches, nausea vomiting and diarrhea, yawn, rhinorrhea.  He has an appointment Monday to see a rehabilitation facility in [place] although he is uncertain of the name, he says the symptoms are severe, constant, he's had some insomnia. Is not taking any other medicine for this.  History reviewed. No pertinent past medical history. Past Surgical History  Procedure Laterality Date  . Cholecystectomy    . Hand surgery     History reviewed. No pertinent family history. History  Substance Use Topics  . Smoking status: Current Every [place] Smoker -- 0.50 packs/[place]    Types: Cigarettes  . Smokeless tobacco: Current User  . Alcohol Use: No    Review of Systems At least 10pt or greater review of systems completed and are negative except where specified in the HPI.  Allergies  Prednisone and Amoxicillin  Home Medications   Current Outpatient Rx  Name  Route  Sig  Dispense  Refill  . acetaminophen (TYLENOL) 325 MG tablet   Oral   Take 650 mg by mouth every 6 (six) hours as needed for pain.         Marland Kitchen azithromycin (ZITHROMAX Z-PAK) 250 MG tablet      Take 2 tablets on [place] one. Take 1 tablets on Loos 2 through 5   6 tablet   0   . chlorpheniramine-HYDROcodone (TUSSIONEX PENNKINETIC ER) 10-8 MG/5ML LQCR   Oral   Take 5 mLs by mouth every 12 (twelve) hours. Take 5 mLs by mouth every 12 (twelve) hours.   140 mL   0   . naproxen sodium (ANAPROX) 220 MG tablet   Oral   Take 220 mg by mouth 2 (two) times daily as needed (pain).          BP 129/86  Pulse 109  Temp(Src) 98 F (36.7 C) (Oral)  Resp 20  SpO2 100% Physical  Exam  Nursing notes reviewed.  Electronic medical record reviewed. VITAL SIGNS:   Filed Vitals:   02/10/13 0247 02/10/13 0500  BP: 129/86 109/62  Pulse: 109 83  Temp: 98 F (36.7 C) 98 F (36.7 C)  TempSrc: Oral Oral  Resp: 20 16  SpO2: 100% 100%   CONSTITUTIONAL: Awake, oriented, appears non-toxic HENT: Atraumatic, normocephalic, oral mucosa pink and moist, airway patent. Nares patent without drainage. External ears normal. EYES: Conjunctiva clear, EOMI, PERRLA NECK: Trachea midline, non-tender, supple CARDIOVASCULAR: Tachycardia, Normal rhythm, No murmurs, rubs, gallops PULMONARY/CHEST: Clear to auscultation, no rhonchi, wheezes, or rales. Symmetrical breath sounds. Non-tender. ABDOMINAL: Non-distended, soft, non-tender - no rebound or guarding.  BS normal. NEUROLOGIC: Non-focal, moving all four extremities, no gross sensory or motor deficits. EXTREMITIES: No clubbing, cyanosis, or edema SKIN: Warm, Dry, No erythema, No rash  ED Course  Procedures (including critical care time) Labs Reviewed - No data to display No results found. No diagnosis found.  MDM  Gary Hicks is a 27 y.o. male presents with opioid withdrawal syndrome. Patient given clonidine and Compazine which helped his symptoms initially, patient is still having some symptoms, I reordered some loperamide as well as Valium however when  the nurse went into the room, the patient had left the building.     Jones Skene, MD 02/10/13 [phone]

## 2013-08-27 ENCOUNTER — Encounter (HOSPITAL_COMMUNITY): Payer: Self-pay | Admitting: Emergency Medicine

## 2013-08-27 ENCOUNTER — Emergency Department (HOSPITAL_COMMUNITY)
Admission: EM | Admit: 2013-08-27 | Discharge: 2013-08-28 | Disposition: A | Discharge status: Home / Self Care | Payer: PRIVATE HEALTH INSURANCE | Attending: Emergency Medicine | Admitting: Emergency Medicine

## 2013-08-27 DIAGNOSIS — F1193 Opioid use, unspecified with withdrawal: Secondary | ICD-10-CM

## 2013-08-27 DIAGNOSIS — F17200 Nicotine dependence, unspecified, uncomplicated: Secondary | ICD-10-CM | POA: Insufficient documentation

## 2013-08-27 DIAGNOSIS — R6883 Chills (without fever): Secondary | ICD-10-CM | POA: Insufficient documentation

## 2013-08-27 DIAGNOSIS — F1120 Opioid dependence, uncomplicated: Secondary | ICD-10-CM | POA: Insufficient documentation

## 2013-08-27 DIAGNOSIS — F1123 Opioid dependence with withdrawal: Secondary | ICD-10-CM

## 2013-08-27 DIAGNOSIS — IMO0001 Reserved for inherently not codable concepts without codable children: Secondary | ICD-10-CM | POA: Insufficient documentation

## 2013-08-27 DIAGNOSIS — R1110 Vomiting, unspecified: Secondary | ICD-10-CM | POA: Insufficient documentation

## 2013-08-27 DIAGNOSIS — F19939 Other psychoactive substance use, unspecified with withdrawal, unspecified: Principal | ICD-10-CM | POA: Insufficient documentation

## 2013-08-27 MED ORDER — CLONIDINE HCL 0.1 MG PO TABS
0.1000 mg | ORAL_TABLET | Freq: Once | ORAL | Status: AC
  Administered 2013-08-27: 0.1 mg via ORAL
  Filled 2013-08-27: qty 1

## 2013-08-27 NOTE — ED Notes (Signed)
Pt states that he could not afford his methadone the past few days and has since used heroine twice.

## 2013-08-27 NOTE — ED Notes (Signed)
Patient states that he has not had his Methadone for 3 days and is going through withdrawals.  Takes 45mg  daily  Restless while in triage

## 2013-08-28 MED ORDER — PROMETHAZINE HCL 25 MG/ML IJ SOLN
25.0000 mg | Freq: Once | INTRAMUSCULAR | Status: AC
  Administered 2013-08-28: 25 mg via INTRAMUSCULAR
  Filled 2013-08-28: qty 1

## 2013-08-28 MED ORDER — DICYCLOMINE HCL 10 MG PO CAPS
10.0000 mg | ORAL_CAPSULE | Freq: Once | ORAL | Status: AC
  Administered 2013-08-28: 10 mg via ORAL
  Filled 2013-08-28: qty 1

## 2013-08-28 MED ORDER — CLONIDINE HCL 0.2 MG PO TABS: 0.1000 mg | ORAL_TABLET | Freq: Two times a day (BID) | ORAL | Status: DC

## 2013-08-28 MED ORDER — KETOROLAC TROMETHAMINE 60 MG/2ML IM SOLN
60.0000 mg | Freq: Once | INTRAMUSCULAR | Status: AC
Start: 2013-08-28 — End: 2013-08-28
  Administered 2013-08-28: 60 mg via INTRAMUSCULAR
  Filled 2013-08-28: qty 2

## 2013-08-28 NOTE — ED Provider Notes (Signed)
CSN: [id number]     Arrival date & time 08/27/13  2250 History   First MD Initiated Contact with Patient 08/27/13 2343     Chief Complaint  Patient presents with  . Delirium Tremens (DTS)   (Consider location/radiation/quality/duration/timing/severity/associated sxs/prior Treatment) HPI History provided by patient. 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 His last methadone was 2 days ago. He has been unable to afford the methadone clinic, states he is able to go in the morning and his mother will help pay for methadone. He has been weaning himself down on methadone. He declines any inpatient help with detox. He is currently working and does not want to lose his job. Tonight symptoms became more severe with chills, vomiting and diarrhea. He is restless and requesting something to help with his symptoms until he can get to the clinic in the morning. No SI or HI. He has used heroin in the past. History reviewed. No pertinent past medical history. Past Surgical History  Procedure Laterality Date  . Cholecystectomy    . Hand surgery     History reviewed. No pertinent family history. History  Substance Use Topics  . Smoking status: Current Every [place] Smoker -- 0.50 packs/[place]    Types: Cigarettes  . Smokeless tobacco: Current User  . Alcohol Use: No    Review of Systems  Constitutional: Positive for chills. Negative for fever.  Respiratory: Negative for shortness of breath.   Cardiovascular: Negative for chest pain.  Gastrointestinal: Positive for vomiting. Negative for abdominal pain.  Genitourinary: Negative for dysuria.  Musculoskeletal: Positive for myalgias. Negative for back pain and neck pain.  Skin: Negative for rash.  Neurological: Negative for headaches.  All other systems reviewed and are negative.    Allergies  Prednisone and Amoxicillin  Home Medications   Current Outpatient Rx  Name  Route  Sig  Dispense  Refill  . acetaminophen  (TYLENOL) 500 MG tablet   Oral   Take 1,000 mg by mouth every 6 (six) hours as needed for mild pain or moderate pain.         . methadone (DOLOPHINE) 5 MG tablet   Oral   Take 45 mg by mouth daily.          BP 126/80  Pulse 74  Temp(Src) 97.7 F (36.5 C) (Oral)  Resp 20  Ht 5\' 8"  (1.727 m)  Wt 131 lb 4.8 oz (59.557 kg)  BMI 19.97 kg/m2  SpO2 100% Physical Exam  Constitutional: He is oriented to person, place, and time. He appears well-developed and well-nourished.  HENT:  Head: Normocephalic and atraumatic.  Eyes: EOM are normal. Pupils are equal, round, and reactive to light.  Neck: Neck supple.  Cardiovascular: Normal rate, regular rhythm and intact distal pulses.   Pulmonary/Chest: Effort normal and breath sounds normal. No respiratory distress.  Abdominal: Soft. Bowel sounds are normal. He exhibits no distension. There is no tenderness.  Musculoskeletal: Normal range of motion. He exhibits no edema.  Neurological: He is alert and oriented to person, place, and time.  Skin: Skin is warm and dry.  Psychiatric:  figiting and restless    ED Course  Procedures (including critical care time)  Vital signs reviewed within normal limits.  Clonidine, Phenergan, Toradol, Bentyl provided  Plan discharge home with prescription for clonidine as needed. Patient will go to the methadone clinic in the morning. Outpatient resources and referrals for opiate addiction provided.  MDM  Diagnosis: Opiate withdrawal  Treated with  medications as above Vital signs and nursing notes reviewed and considered Stable for discharge    Teressa Lower, MD 08/28/13 [phone]

## 2013-08-28 NOTE — Discharge Instructions (Signed)
Narcotic Withdrawal  When drug use interferes with normal living activities and relationships, it is abuse. Abuse includes problems with family and friends. Psychological dependence has developed when your mind tells you that the drug is needed. This is usually followed by a physical dependence in which you need more of the drug to get the same feeling or "high." This is known as addiction or chemical dependency. Risk is greater when chemical dependency exists in the family.  SYMPTOMS  When tolerance to narcotics has developed, stopping of the narcotic suddenly can cause uncomfortable physical symptoms. Most of the time these are mild and consist of shakes or jitters (tremors) in the hands,a rapid heart rate, rapid breathing, and temperature. Sometimes these symptoms are associated with anxiety, panic attacks, and bad dreams. Other symptoms include:  Irritability.  Anxiety.  Runny nose.  "Goose flesh."  Diarrhea.  Feeling sick to the stomach (nauseous).  Muscle spasms.  Sleeplessness.  Chills.  Sweats.  Drug cravings.  Confusion. The severity of the withdrawal is based on the individual and varies from person to person. Many people choose to continue using narcotics to get rid of the discomfort of withdrawal. They also use to try to feel normal.  TREATMENT  Quitting an addiction means stopping use of all chemicals. This is hard but may save your life. With continual drug use, possible outcomes are often loss of self respect and esteem, violence, death, and eventually prison if the use of narcotics has led to the death of another.  Addiction cannot be cured, but it can be stopped. This often requires outside help and the care of professionals. Most hospitals and clinics can refer you to a specialized care center.  It is not necessary for you to go through the uncomfortable symptoms of withdrawal. Your caregiver can provide you with medications that will help you through this difficult period. Try  to avoid situations, friends, or alcohol, which may have made it possible for you to continue using narcotics in the past. Learn how to say no!  HOME CARE INSTRUCTIONS  Drink fluids, get plenty of rest, and take hot baths.  Medicines may be prescribed to help control withdrawal symptoms.  Over-the-counter medicines may be helpful to control diarrhea or an upset stomach.  If your problems resulted from taking prescription pain medicines, make sure you have a follow-up visit with your caregiver within the next few days. Be open about this problem.  If you are dependent or addicted to street drugs, contact a local drug and alcohol treatment center or Narcotics Anonymous.  Have someone with you to monitor your symptoms.  Engage in healthy activities with friends who do not use drugs.  Stay away from the drug scene. It takes a long period of time to overcome addictions to all drugs. There may be times when you feel as though you want to use. Following loss of a physical addiction and going through withdrawal, you have conquered the most difficult part of getting rid of an addiction. Gradually, you will have a lessening of the craving that is telling you that you need narcotics to feel normal. Call your caregiver or a member of your support group if more support is needed. Learn who to talk to in your family and among your friends so that during these periods you can receive outside help.  SEEK IMMEDIATE MEDICAL CARE IF:  You have vomiting that cannot be controlled, especially if you cannot keep liquids down.  You are seeing things or hearing  voices that are not really there (hallucinating).  You have a seizure. Document Released: 10/09/2002 Document Revised: 10/11/2011 Document Reviewed: 07/14/2008  [facility] Patient Information 2014 [place].    Emergency Department Resource Guide 1) Find a Doctor and Pay Out of Pocket Although you won't have to find out who is covered by your insurance plan,  it is a good idea to ask around and get recommendations. You will then need to call the office and see if the doctor you have chosen will accept you as a new patient and what types of options they offer for patients who are self-pay. Some doctors offer discounts or will set up payment plans for their patients who do not have insurance, but you will need to ask so you aren't surprised when you get to your appointment.  2) Contact Your Local Health Department Not all health departments have doctors that can see patients for sick visits, but many do, so it is worth a call to see if yours does. If you don't know where your local health department is, you can check in your phone book. The CDC also has a tool to help you locate your state's health department, and many state websites also have listings of all of their local health departments.  3) Find a [facility] If your illness is not likely to be very severe or complicated, you may want to try a walk in clinic. These are popping up all over the country in pharmacies, drugstores, and shopping centers. They're usually staffed by nurse practitioners or physician assistants that have been trained to treat common illnesses and complaints. They're usually fairly quick and inexpensive. However, if you have serious medical issues or chronic medical problems, these are probably not your best option.  No Primary Care Doctor: - Call Health Connect at  [phone] - they can help you locate a primary care doctor that  accepts your insurance, provides certain services, etc. - Physician Referral Service- [phone]  Chronic Pain Problems: Organization         Address  Phone   Notes  [facility]  [phone] Patients need to be referred by their primary care doctor.   Medication Assistance: Organization         Address  Phone   Notes  [facility] Medication [facility] [place] [place], [place] [address] [phone] --Must be a resident of [facility] -- Must have NO insurance coverage whatsoever (no Medicaid/ Medicare, etc.) -- The pt. MUST have a primary care doctor that directs their care regularly and follows them in the community   MedAssist  [phone]   [employer]  [phone]    Agencies that provide inexpensive medical care: Organization         Address  Phone   Notes  [place]  [phone]   [facility]    [phone]   [facility] [place],  [address] [phone]   [place] [address] [place] [phone]   Planned Parenthood    [phone]   [facility]    [phone]   [place] and [place], [place] Phone:  [phone], Fax:  [phone] Hours of Operation:  9 am - 6 pm, M-F.  Also accepts Medicaid/Medicare and self-pay.  [facility] [place]  [place], [address], [place] Phone: [phone], Fax: [phone]. Hours of Operation:  8:30 am - 5:30 pm, M-F.  Also accepts Medicaid and self-pay.  [facility] [address], [place] Phone: [phone]   [place], [place], [place] [phone], Ext. [phone] Mondays & Thursdays: 7-9 AM.  First 15 patients are seen on a first come, first serve basis.    [place] Providers:  Organization         Address  Phone   Notes  [facility] [address],  [phone] Also accepts self-pay patients.  [facility] [id number] [place], [place]  [phone]   [place], [address], [place] [phone]   [facility] [address], [place] [phone]   Lucianne Lei [address], [place]   [phone] Only accepts [place] Access [place] patients after they have their name applied to their card.   Self-Pay (no insurance) in [facility]:  Organization         Address  Phone   Notes  Sickle Cell Patients, [facility] [place] [phone]   [facility] [place] [phone]   [facility] [place]  [place], [place], [place] [phone]   [facility]/Dr. Osei-Bonsu  [address], [place] or [address], [place] [phone] Phone number for both [place] and [place] locations is the same.  Urgent Medical and [facility] [address], [place] [phone]   [facility] [address] [place] or [address] [phone]   [facility] [address] [place] [phone], phone; [phone], fax Sees patients 1st and 3rd Saturday of every month.  Must not qualify for public or private insurance (i.e. Medicaid, Medicare, [place] Health Choice, Veterans' Benefits)  Household income should be no more than 200% of the poverty level The clinic cannot treat you if you are pregnant or think you are pregnant  Sexually transmitted diseases are not treated at the clinic.    Dental Care: Organization         Address  Phone  Notes  [facility] Department of [facility] [place] [phone] Accepts children up to age 61 who are enrolled in [place] or [place]; pregnant women with a Medicaid card; and children who have applied for Medicaid or [place] Health Choice, but were declined, whose parents can pay a reduced fee at time of service.  [facility] Department of [facility]  [address], [place] [phone] Accepts children up to age 69 who are enrolled in [place] or [place]; pregnant women with a Medicaid card; and children who have applied for Medicaid or [place] Health Choice, but were declined, whose parents can pay a reduced fee at time of service.  [facility] PRO[facility]  [place] [phone] Patients are seen by appointment only. Walk-ins are not accepted. [place] will see patients 46 years of age and older. Monday - Tuesday (8am-5pm) Most Wednesdays (8:30-5pm) $30 per visit, cash only  [place]  PROGRAM  [address], [facility] [phone] Patients are seen by appointment only. Walk-ins are not accepted. [place] will see patients 59 years of age and older. One Wednesday Evening (Monthly: Volunteer Based).  $30 per visit, cash only  [place]  [phone] for adults; Children under age 10, call Graduate Pediatric Dentistry at [phone]. Children aged 47-14, please call [phone] to request a pediatric application.  Dental services are provided in all areas of dental care including fillings, crowns and bridges, complete and partial dentures, implants, gum treatment, root canals, and extractions. Preventive care is also provided. Treatment is provided to both adults and children. Patients are selected via a lottery and there is often a waiting list.   [facility] [address] [place]  [phone] [email or web address]   [facility] [address] [place], [place] [phone], Ext. [phone] Second and Fourth Thursday of each month, opens at 6:30 AM; Clinic ends at 9 AM.  Patients are seen on a first-come first-served basis, and a limited number are seen during each clinic.   [facility]  [address] Hillard Danker [place], [place] [phone]   Eligibility Requirements You must have lived in [place], [place], or [place] counties for at least the last three months.   You cannot be eligible for state or  federal sponsored [employer], including [employer], [place], or [employer].   You generally cannot be eligible for healthcare insurance through your employer.    How to apply: Eligibility screenings are held every Tuesday and Wednesday afternoon from 1:00 pm until 4:00 pm. You do not need an appointment for the interview!  [facility] [address], [place], [place]   [place]  [place] Department  [place]  [phone]    Behavioral Health Resources in the Community: Intensive Outpatient Programs Organization         Address  Phone  Notes  [place] Gilchrist. [address] [place], [place] [phone]   [facility] Outpatient [address], [place], [place]   ADS: Alcohol & Drug Svcs [address] [place], [place]   [place] [address],  [place], [place] or [phone]   Substance Abuse Resources Organization         Address  Phone  Notes  Alcohol and Drug Services  [phone]   [place]  [phone]   [facility] [place]   [place]  [phone]   Residential & Outpatient Substance Abuse Program  [phone]   Psychological Services Organization         Address  Phone  Notes  [facility] [place]  [place]  [phone]   [place] [address] [place] or [phone]    Mobile Crisis Teams Organization         Address  Phone  Notes  [facility], Mobile Crisis Care Unit  [phone]   [facility] Psychotherapeutic Services  [address] [place], [place]   [place] [address], [place] [phone]    Self-Help/Support Groups Organization         Address  Phone              Notes  [place].  of [place] - variety of support groups  [place] Call for more information  Narcotics Anonymous (NA), [facility] [address], [employer] [place]  2 meetings at this location   Special educational needs teacher         Address  Phone  Notes  [facility]idential Treatment [place],    [place]  [phone]   [facility]  [address] [place] [id number], [place], [place]   [place], [place] [phone] Admissions: 8am-3pm M-F  Incentives Substance [place] [address]    [place], [place] [phone]   [facility] [address] [place], [place], [place]   [facility] [place] [address],  [place], [place]   [facility] - Intensive Outpatient [address] Kristeen [address], [place], [place]   [facility].,  [place], [place] [phone] or [phone]   [facility] (RT[facility]) [address], [place], [place] Accepts Medicaid  [facility] [place] [address],  [place] [phone] Substance Abuse/Addiction Treatment   [facility] Organization         Address  Phone  Notes  [facility]  [phone]   Domenic Schwab, PhD [address] [place], [place]   [phone] or [phone]   [place], [place] [phone]   [facility] [address] [place], [place] [phone] Insurance/Medicaid/sponsorship through [facility] and Families [address], [address]                                    [place], [place] [phone] [place] [address], [place] [phone]    Dr. Adele Schilder  [phone]   [facility]  Dept. 1) [address] [place] 2) [place] 3)  [place] [address], [place] [phone]  [phone]   [place] [phone] or [phone] (After Hours)

## 2013-09-23 ENCOUNTER — Encounter (HOSPITAL_COMMUNITY): Payer: Self-pay | Admitting: Emergency Medicine

## 2013-09-23 ENCOUNTER — Emergency Department (HOSPITAL_COMMUNITY)
Admission: EM | Admit: 2013-09-23 | Discharge: 2013-09-23 | Discharge status: Against Medical Advice | Payer: PRIVATE HEALTH INSURANCE | Attending: Emergency Medicine | Admitting: Emergency Medicine

## 2013-09-23 DIAGNOSIS — Z0489 Encounter for examination and observation for other specified reasons: Secondary | ICD-10-CM | POA: Insufficient documentation

## 2013-09-23 DIAGNOSIS — F1110 Opioid abuse, uncomplicated: Secondary | ICD-10-CM | POA: Insufficient documentation

## 2013-09-23 LAB — CBC
HCT: 44 % (ref 39.0–52.0)
Hemoglobin: 15.6 g/dL (ref 13.0–17.0)
MCH: 33.3 pg (ref 26.0–34.0)
MCHC: 35.5 g/dL (ref 30.0–36.0)
MCV: 93.8 fL (ref 78.0–100.0)
Platelets: 331 10*3/uL (ref 150–400)
RBC: 4.69 MIL/uL (ref 4.22–5.81)
RDW: 13.8 % (ref 11.5–15.5)
WBC: 8.2 10*3/uL (ref 4.0–10.5)

## 2013-09-23 LAB — COMPREHENSIVE METABOLIC PANEL
ALT: 15 U/L (ref 0–53)
AST: 17 U/L (ref 0–37)
Albumin: 4.3 g/dL (ref 3.5–5.2)
Alkaline Phosphatase: 90 U/L (ref 39–117)
BUN: 10 mg/dL (ref 6–23)
CO2: 25 mEq/L (ref 19–32)
Calcium: 10 mg/dL (ref 8.4–10.5)
Chloride: 102 mEq/L (ref 96–112)
Creatinine, Ser: 0.78 mg/dL (ref 0.50–1.35)
GFR calc Af Amer: >90 mL/min (ref >90)
GFR calc non Af Amer: >90 mL/min (ref >90)
Glucose, Bld: 78 mg/dL (ref 70–99)
Potassium: 4.1 mEq/L (ref 3.7–5.3)
Sodium: 142 mEq/L (ref 137–147)
Total Bilirubin: 0.4 mg/dL (ref 0.3–1.2)
Total Protein: 8.9 g/dL — ABNORMAL HIGH (ref 6.0–8.3)

## 2013-09-23 LAB — ACETAMINOPHEN LEVEL: Acetaminophen (Tylenol), Serum: <15 ug/mL (ref 10–30)

## 2013-09-23 LAB — SALICYLATE LEVEL: Salicylate Lvl: <2 mg/dL — ABNORMAL LOW (ref 2.8–20.0)

## 2013-09-23 LAB — ETHANOL: Alcohol, Ethyl (B): <11 mg/dL (ref 0–11)

## 2013-09-23 NOTE — ED Notes (Signed)
Patient not in room. Checked for patient in triage and ED waiting room patient not located. Charge Nurse notified.

## 2013-09-23 NOTE — ED Notes (Signed)
Pt reports here for detox from opiods and methadone. Pt last used methadone today. Pt denies SI/HI.

## 2013-10-08 ENCOUNTER — Emergency Department (HOSPITAL_COMMUNITY)
Admission: EM | Admit: 2013-10-08 | Discharge: 2013-10-09 | Disposition: A | Discharge status: Home / Self Care | Payer: PRIVATE HEALTH INSURANCE | Attending: Emergency Medicine | Admitting: Emergency Medicine

## 2013-10-08 ENCOUNTER — Encounter (HOSPITAL_COMMUNITY): Payer: Self-pay | Admitting: Emergency Medicine

## 2013-10-08 DIAGNOSIS — F1910 Other psychoactive substance abuse, uncomplicated: Secondary | ICD-10-CM | POA: Insufficient documentation

## 2013-10-08 DIAGNOSIS — R112 Nausea with vomiting, unspecified: Secondary | ICD-10-CM | POA: Insufficient documentation

## 2013-10-08 DIAGNOSIS — IMO0001 Reserved for inherently not codable concepts without codable children: Secondary | ICD-10-CM | POA: Insufficient documentation

## 2013-10-08 DIAGNOSIS — F17200 Nicotine dependence, unspecified, uncomplicated: Secondary | ICD-10-CM | POA: Insufficient documentation

## 2013-10-08 DIAGNOSIS — Z79899 Other long term (current) drug therapy: Secondary | ICD-10-CM | POA: Insufficient documentation

## 2013-10-08 DIAGNOSIS — Z880 Allergy status to penicillin: Secondary | ICD-10-CM | POA: Insufficient documentation

## 2013-10-08 DIAGNOSIS — G479 Sleep disorder, unspecified: Secondary | ICD-10-CM | POA: Insufficient documentation

## 2013-10-08 DIAGNOSIS — F1120 Opioid dependence, uncomplicated: Secondary | ICD-10-CM | POA: Insufficient documentation

## 2013-10-08 DIAGNOSIS — R197 Diarrhea, unspecified: Secondary | ICD-10-CM | POA: Insufficient documentation

## 2013-10-08 DIAGNOSIS — F1110 Opioid abuse, uncomplicated: Secondary | ICD-10-CM

## 2013-10-08 LAB — COMPREHENSIVE METABOLIC PANEL
ALBUMIN: 4.5 g/dL (ref 3.5–5.2)
ALT: 20 U/L (ref 0–53)
AST: 35 U/L (ref 0–37)
Alkaline Phosphatase: 91 U/L (ref 39–117)
BUN: 14 mg/dL (ref 6–23)
CALCIUM: 9.9 mg/dL (ref 8.4–10.5)
CO2: 26 mEq/L (ref 19–32)
Chloride: 99 mEq/L (ref 96–112)
Creatinine, Ser: 0.73 mg/dL (ref 0.50–1.35)
GFR calc Af Amer: >90 mL/min (ref >90)
GFR calc non Af Amer: >90 mL/min (ref >90)
Glucose, Bld: 84 mg/dL (ref 70–99)
Potassium: 6 mEq/L — ABNORMAL HIGH (ref 3.7–5.3)
Sodium: 139 mEq/L (ref 137–147)
TOTAL PROTEIN: 8.9 g/dL — AB (ref 6.0–8.3)
Total Bilirubin: 0.3 mg/dL (ref 0.3–1.2)

## 2013-10-08 LAB — CBC
HCT: 48 % (ref 39.0–52.0)
Hemoglobin: 16.6 g/dL (ref 13.0–17.0)
MCH: 32.5 pg (ref 26.0–34.0)
MCHC: 34.6 g/dL (ref 30.0–36.0)
MCV: 94.1 fL (ref 78.0–100.0)
PLATELETS: 219 10*3/uL (ref 150–400)
RBC: 5.1 MIL/uL (ref 4.22–5.81)
RDW: 13.7 % (ref 11.5–15.5)
WBC: 5.5 10*3/uL (ref 4.0–10.5)

## 2013-10-08 LAB — RAPID URINE DRUG SCREEN, HOSP PERFORMED
AMPHETAMINES: NOT DETECTED
Barbiturates: NOT DETECTED
Benzodiazepines: NOT DETECTED
Cocaine: POSITIVE — AB
OPIATES: POSITIVE — AB
Tetrahydrocannabinol: POSITIVE — AB

## 2013-10-08 LAB — SALICYLATE LEVEL

## 2013-10-08 LAB — ACETAMINOPHEN LEVEL: Acetaminophen (Tylenol), Serum: <15 ug/mL (ref 10–30)

## 2013-10-08 LAB — ETHANOL: Alcohol, Ethyl (B): <11 mg/dL (ref 0–11)

## 2013-10-08 LAB — POTASSIUM: Potassium: 4.5 mEq/L (ref 3.7–5.3)

## 2013-10-08 MED ORDER — ONDANSETRON 4 MG PO TBDP
4.0000 mg | ORAL_TABLET | Freq: Once | ORAL | Status: AC
  Administered 2013-10-08: 4 mg via ORAL
  Filled 2013-10-08: qty 1

## 2013-10-08 MED ORDER — LORAZEPAM 1 MG PO TABS
1.0000 mg | ORAL_TABLET | Freq: Once | ORAL | Status: AC
  Administered 2013-10-08: 1 mg via ORAL
  Filled 2013-10-08: qty 1

## 2013-10-08 MED ORDER — CLONIDINE HCL 0.1 MG PO TABS
0.1000 mg | ORAL_TABLET | Freq: Once | ORAL | Status: AC
  Administered 2013-10-08: 0.1 mg via ORAL
  Filled 2013-10-08: qty 1

## 2013-10-08 NOTE — ED Provider Notes (Signed)
Care assumed form Dr. Justin Mend. Patient is here for detox from opiates. He has received one Zofran and one clonidine for withdrawal sxs. Patient now HDS. Awaiting TTS consult.    11:43 PM No acute events during my care. Plan to keep patient until placed in rehab center. Diet ordered.  Care transferred to Dr. Sabra Heck.       Ruthell Rummage, MD 10/08/13 [phone]

## 2013-10-08 NOTE — ED Notes (Addendum)
Called [facility] in reference to telepsych; delay because of influx of pts at [facility]; family at bedside until 2:30 pm

## 2013-10-08 NOTE — Discharge Instructions (Signed)
Chemical Dependency Chemical dependency is an addiction to drugs or alcohol. It is characterized by the repeated behavior of seeking out and using drugs and alcohol despite harmful consequences to the health and safety of ones self and others.  RISK FACTORS There are certain situations or behaviors that increase a person's risk for chemical dependency. These include:  A family history of chemical dependency.  A history of mental health issues, including depression and anxiety.  A home environment where drugs and alcohol are easily available to you.  Drug or alcohol use at a young age. SYMPTOMS  The following symptoms can indicate chemical dependency:  Inability to limit the use of drugs or alcohol.  Nausea, sweating, shakiness, and anxiety that occurs when alcohol or drugs are not being used.  An increase in amount of drugs or alcohol that is necessary to get drunk or high. People who experience these symptoms can assess their use of drugs and alcohol by asking themselves the following questions:  Have you been told by friends or family that they are worried about your use of alcohol or drugs?  Do friends and family ever tell you about things you did while drinking alcohol or using drugs that you do not remember?  Do you lie about using alcohol or drugs or about the amounts you use?  Do you have difficulty completing daily tasks unless you use alcohol or drugs?  Is the level of your work or school performance lower because of your drug or alcohol use?  Do you get sick from using drugs or alcohol but keep using anyway?  Do you feel uncomfortable in social situations unless you use alcohol or drugs?  Do you use drugs or alcohol to help forget problems? An answer of yes to any of these questions may indicate chemical dependency. Professional evaluation is suggested. Document Released: 07/13/2001 Document Revised: 10/11/2011 Document Reviewed: 09/24/2010 [facility] Patient  Information 2014 [place], [place].  Substance Abuse Your exam indicates that you have a problem with substance abuse. Substance abuse is the misuse of alcohol or drugs that causes problems in family life, friendships, and work relationships. Substance abuse is the most important cause of premature illness, disability, and death in our society. It is also the greatest threat to a person's mental and spiritual well being. Substance abuse can start out in an innocent way, such as social drinking or taking a little extra medication prescribed by your doctor. No one starts out with the intention of becoming an alcoholic or an addict. Substance abuse victims cannot control their use of alcohol or drugs. They may become intoxicated daily or go on weekend binges. Often there is a strong desire to quit, but attempts to stop using often fail. Encounters with law enforcement or conflicts with family members, friends, and work associates are signs of a potential problem. Recovery is always possible, although the craving for some drugs makes it difficult to quit without assistance. Many treatment programs are available to help people stop abusing alcohol or drugs. The first step in treatment is to admit you have a problem. This is a major hurdle because denial is a powerful force with substance abuse. Alcoholics Anonymous, Narcotics Anonymous, Cocaine Anonymous, and other recovery groups and programs can be very useful in helping people to quit. If you do not feel okay about your drug or alcohol use and if it is causing you trouble, we want to encourage you to talk about it with your doctor or with someone from a recovery  group who can help you. You could also call the [employer] on Drug Abuse at 1-800-662-HELP. It is up to you to take the first step. AL-ANON and ALA-TEEN are support groups for friends and family members of an alcohol or drug dependent person. The people who love and care for the alcoholic or  addicted person often need help, too. For information about these organizations, check your phone directory or call a local alcohol or drug treatment center. Document Released: 08/26/2004 Document Revised: 10/11/2011 Document Reviewed: 07/20/2008 [facility] Patient Information 2014 [place].

## 2013-10-08 NOTE — ED Provider Notes (Signed)
Medical screening examination/treatment/procedure(s) were conducted as a shared visit with non-physician practitioner(s) or resident  and myself.  I personally evaluated the patient during the encounter and agree with the findings and plan unless otherwise indicated.    I have personally reviewed any xrays and/ or EKG's with the provider and I agree with interpretation.   Polysubstance/ narcotic drug abuse hx, last used last night. Pt wishes detox and is serious about improving lifestyle, family with patient.  Neuro intact, neck supple, no shaking, CNs intact.  No SI or HI.  Plan for labs, [facility] assessment.  Detox/ narcotic abuse/addiction  Mariea Clonts, MD 10/08/13 [phone]

## 2013-10-08 NOTE — ED Provider Notes (Signed)
CSN: [id number]     Arrival date & time 10/08/13  1057 History   First MD Initiated Contact with Patient 10/08/13 1259     Chief Complaint  Patient presents with  . Medical Clearance   HPI Comments: 28 yo M hx of opiate addiction, presents with CC of opiate withdrawal.  Pt states he has been receiving treatment from methadone clinic until last week at [facility].  He states he was down to 35 mg tablets.  He has "ran out of money", and has been unable to afford this treatment over the last week.  He states since that time, he has been attempting to detox himself, as he does not want to be on methadone now.  He states while self-detoxing he has had myalgias in legs, nausea, vomiting, diarrhea, and insomnia.  To help with his symptoms, he endorses to taking Percocet, Roxicodone, last use this AM.  He denies EtOH, or any other drug use.  He denies any other symptoms, and states he has otherwise been in good health.  Family is with pt, and are asking for help with getting pt proper detox.    The history is provided by the patient. No language interpreter was used.    History reviewed. No pertinent past medical history. Past Surgical History  Procedure Laterality Date  . Cholecystectomy    . Hand surgery     History reviewed. No pertinent family history. History  Substance Use Topics  . Smoking status: Current [place] Smoker -- 0.50 packs/[place]    Types: Cigarettes  . Smokeless tobacco: Current User  . Alcohol Use: No    Review of Systems  Constitutional: Negative for fever and chills.  Respiratory: Negative for cough and shortness of breath.   Cardiovascular: Negative for chest pain, palpitations and leg swelling.  Gastrointestinal: Positive for nausea, vomiting and diarrhea. Negative for abdominal pain and constipation.  Musculoskeletal: Positive for myalgias.  Skin: Negative for rash.  Neurological: Negative for dizziness, weakness, light-headedness, numbness and headaches.   Hematological: Negative for adenopathy. Does not bruise/bleed easily.  Psychiatric/Behavioral: Positive for sleep disturbance.  All other systems reviewed and are negative.      Allergies  Prednisone and Amoxicillin  Home Medications   Current Outpatient Rx  Name  Route  Sig  Dispense  Refill  . acetaminophen (TYLENOL) 500 MG tablet   Oral   Take 1,000 mg by mouth every 6 (six) hours as needed for mild pain or moderate pain.         . methadone (DOLOPHINE) 5 MG tablet   Oral   Take 35 mg by mouth daily. [facility]          BP 134/84  Pulse 92  Temp(Src) 98.6 F (37 C) (Oral)  Resp 14  SpO2 99% Physical Exam  Nursing note and vitals reviewed. Constitutional: He is oriented to person, place, and time. He appears well-developed and well-nourished.  HENT:  Head: Normocephalic and atraumatic.  Right Ear: External ear normal.  Left Ear: External ear normal.  Nose: Nose normal.  Mouth/Throat: Oropharynx is clear and moist.  Eyes: Conjunctivae and EOM are normal. Pupils are equal, round, and reactive to light.  Neck: Normal range of motion. Neck supple.  Cardiovascular: Normal rate, regular rhythm, normal heart sounds and intact distal pulses.   Pulmonary/Chest: Effort normal and breath sounds normal. No respiratory distress. He has no wheezes. He has no rales. He exhibits no tenderness.  Abdominal: Soft. Bowel sounds are normal. He  exhibits no distension and no mass. There is no tenderness. There is no rebound and no guarding.  Musculoskeletal: Normal range of motion.  Neurological: He is alert and oriented to person, place, and time.  Skin: Skin is warm and dry.    ED Course  Procedures (including critical care time) Labs Review Labs Reviewed  COMPREHENSIVE METABOLIC PANEL - Abnormal; Notable for the following:    Potassium 6.0 (*)    Total Protein 8.9 (*)    All other components within normal limits  SALICYLATE LEVEL - Abnormal; Notable for  the following:    Salicylate Lvl <3.5 (*)    All other components within normal limits  URINE RAPID DRUG SCREEN (HOSP PERFORMED) - Abnormal; Notable for the following:    Opiates POSITIVE (*)    Cocaine POSITIVE (*)    Tetrahydrocannabinol POSITIVE (*)    All other components within normal limits  ACETAMINOPHEN LEVEL  CBC  ETHANOL  POTASSIUM   Imaging Review No results found.   EKG Interpretation None      MDM   Final diagnoses:  None   28 yo M hx of opiate addiction, presents with CC of opiate withdrawal.   Filed Vitals:   10/08/13 1326  BP: 134/84  Pulse: 92  Temp: 98.6 F (37 C)  Resp: 14   Physical exam as above.  Pt looks in no distress.  Slightly tachy 101 on arrival, but VS otherwise WNL.  TTS consult for detox.  Labs drawn which show UDS positive for opiates, cocaine, THC.  EtOH < 11.  Salicylate and Tylenol levels WNL.  K 6.0, likely hemolyzed, and repeated which is 4.5.  Pt placed in psych hold.  Clonidine, Zofran for symptoms of opiate withdrawal with improvement in symptoms.  Awaiting TTS consult for evaluation for detox.  Pt signed out to Dr. Judithann Graves.  Awaiting TTS consult.    Pt's care discussed with Dr. Reather Converse.   Sinda Du, MD     Sinda Du, MD 10/08/13 [phone]

## 2013-10-08 NOTE — ED Notes (Signed)
Lab attempt x 2 with no success, RN aware

## 2013-10-08 NOTE — BH Assessment (Addendum)
Tele Assessment Note   Gary Hicks is an 28 y.o. male with increased social anxiety and agoraphobia. Patient presents to [facility] requesting detox. Says that he has a opiate addiction. He started using opiates at age 2. He wanted to end his use of opiates, therefore; went to [facility] for services then transferred to [facility]. Patient has participated in services at the [facility] clinic in the past year. He started at 80 mg and was tapered down to 35 mg. His last use 1 week. Says that he stopped method one 1 week ago after being evicted from his home. Patient's withdrawal symptoms became increasingly unbearable so much so that he was experiencing nausea, vomiting, diarrhea, agitation, and restless legs.Says that in the past 5 days he started using Opiates (Roxy's). Patient also reports use of THC. His UDS is positive for cocaine, however; patient sts he doesn't recall using. He denies SI, HI, and AVH's. Pt calm and cooperative. He has current legal charges breaking and entering/possession of stolen property. He is also on house arrest with a 7pm curfew as patient shows this writer his ankle bracelet.  He has a upcoming court case 10/29/2013. He also denies depression but expresses concern for his worsening anxiety. He reports panic attacks daily. Patient denies previous history of inpatient treatment for his substance abuse.   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 No pertinent past medical history. Axis IV: other psychosocial or environmental problems, problems related to social environment, problems with access to health care services and problems with primary support group Axis V: 31-40 impairment in reality testing  Past Medical History: History reviewed. No pertinent past medical history.  Past Surgical History  Procedure Laterality Date  . Cholecystectomy    . Hand surgery      Family History: History reviewed. No  pertinent family history.  Social History:  reports that he has been smoking Cigarettes.  He has been smoking about 0.50 packs per [place]. He uses smokeless tobacco. He reports that he does not drink alcohol or use illicit drugs.  Additional Social History:  Alcohol / Drug Use Pain Medications: SEE MAR Prescriptions: SEE MAR Over the Counter: SEE MAR History of alcohol / drug use?: Yes Negative Consequences of Use: Financial;Personal relationships Withdrawal Symptoms: Agitation;Nausea / Vomiting;Diarrhea;Irritability;Sweats;Weakness;Fever / Chills Substance #1 Name of Substance 1: Methodone 1 - Age of First Use: 28 yrs old  1 - Amount (size/oz): started at 80mg 's 1week ago and now currently at 35mg 's 1 - Frequency: daily  1 - Duration: 1 yr  1 - Last Use / Amount: 1 week ago Substance #2 Name of Substance 2: THC 2 - Age of First Use: 28 yrs old  2 - Amount (size/oz): 1 bowl per use  2 - Frequency: "occasional use" 2 - Duration: ongoing  2 - Last Use / Amount: 1 wk ago  Substance #3 Name of Substance 3: UDS + for Cocaine; pt denies use 3 - Age of First Use: unk 3 - Amount (size/oz): unk 3 - Frequency: unk 3 - Duration: unk 3 - Last Use / Amount: unk Substance #4 Name of Substance 4: Opiates -"Roxys or any kind I can get" 4 - Age of First Use: 28 yrs old  4 - Amount (size/oz): 15-20 mg's 4 - Frequency: daily  4 - Duration: on-gong  4 - Last Use / Amount: "last night about 10pm"  CIWA: CIWA-Ar BP: 100/65 mmHg Pulse Rate: 86  COWS: Clinical Opiate Withdrawal Scale (COWS) Resting Pulse Rate: Pulse Rate 81-100 Sweating: Subjective report of chills or flushing Restlessness: Reports difficulty sitting still, but is able to do so Pupil Size: Pupils possibly larger than normal for room light Bone or Joint Aches: Patient reports sever diffuse aching of joints/muscles Runny Nose or Tearing: Nasal stuffiness or unusually moist eyes GI Upset: No GI symptoms Tremor: Tremor can be  felt, but not observed Yawning: No yawning Anxiety or Irritability: Patient reports increasing irritability or anxiousness Gooseflesh Skin: Skin is smooth COWS Total Score: 9  Allergies:  Allergies  Allergen Reactions  . Prednisone Hives  . Amoxicillin Hives    Home Medications:  (Not in a hospital admission)  OB/GYN Status:  No LMP for male patient.  General Assessment Data Location of Assessment: [facility] ED Is this a Tele or Face-to-Face Assessment?: Tele Assessment Is this an Initial Assessment or a Re-assessment for this encounter?: Initial Assessment Living Arrangements: Other relatives;Other (Comment) (w/ relatives; recently evicted ) Can pt return to current living arrangement?: No Admission Status: Voluntary Is patient capable of signing voluntary admission?: Yes Transfer from: Acute Hospital Referral Source: Medical Floor Inpatient     [place] Living Arrangements: Other relatives;Other (Comment) (w/ relatives; recently evicted ) Name of Psychiatrist:  (No psychiatrist ) Name of Therapist:  (No therapist )  Education Status Is patient currently in school?: No  Risk to self Suicidal Ideation: No Suicidal Intent: No Is patient at risk for suicide?: No Suicidal Plan?: No Access to Means: No What has been your use of drugs/alcohol within the last 12 months?:  (n/a) Previous Attempts/Gestures: No How many times?:  (0) Other Self Harm Risks:  (n/a) Triggers for Past Attempts: Other (Comment) (no previous attempts or gestures ) Intentional Self Injurious Behavior: None Family Suicide History: Unknown Recent stressful life event(s): Other (Comment) (recenlty evicted and trying to refrain from  opiates ) Persecutory voices/beliefs?: No Depression: Yes Depression Symptoms: Feeling worthless/self pity Substance abuse history and/or treatment for substance abuse?: No Suicide prevention information given to non-admitted patients: Not applicable  Risk to  Others Homicidal Ideation: No Thoughts of Harm to Others: No Current Homicidal Intent: No Current Homicidal Plan: No Access to Homicidal Means: No Identified Victim:  (n/a) History of harm to others?: No Assessment of Violence: None Noted Does patient have access to weapons?: No Criminal Charges Pending?: Yes Describe Pending Criminal Charges:  (B&E; possesion of stolen property) Does patient have a court date: Yes (Currently on house arrest) Court Date:  (10/29/2013)  Psychosis Hallucinations: None noted Delusions: None noted  Mental Status Report Appear/Hygiene: Disheveled Eye Contact: Fair Motor Activity: Freedom of movement Speech: Logical/coherent Level of Consciousness: Alert Mood: Depressed Affect: Appropriate to circumstance Anxiety Level: Minimal Thought Processes: Coherent Judgement: Impaired Orientation: Person;Place;Time;Situation Obsessive Compulsive Thoughts/Behaviors: None  Cognitive Functioning Concentration: Decreased Memory: Recent Intact;Remote Intact IQ: Average Insight: Fair Impulse Control: Fair Appetite: Fair Weight Loss:  (none reported ) Weight Gain:  (none reported ) Sleep: Decreased Total Hours of Sleep:  (varies ) Vegetative Symptoms: None  ADLScreening [facility] Assessment Services) Patient's cognitive ability adequate to safely complete daily activities?: Yes Patient able to express need for assistance with ADLs?: No Independently performs ADLs?: Yes (appropriate for developmental age)  Prior Inpatient Therapy Prior Inpatient Therapy: No Prior Therapy Dates:  (n/a) Prior Therapy Facilty/Provider(s):  (n/a) Reason for Treatment:  (n/a)  Prior Outpatient Therapy Prior Outpatient Therapy: Yes Prior Therapy Dates:  (up until 1 wk-[facility]; previously  [facility]) Prior Therapy Facilty/Provider(s):  ([facility] and [facility]) Reason for Treatment:  (substance abuse treatment-[facility] )  ADL Screening (condition at time of admission) Patient's  cognitive ability adequate to safely complete daily activities?: Yes Is the patient deaf or have difficulty hearing?: No Does the patient have difficulty seeing, even when wearing glasses/contacts?: No Does the patient have difficulty concentrating, remembering, or making decisions?: Yes Patient able to express need for assistance with ADLs?: No Does the patient have difficulty dressing or bathing?: Yes Independently performs ADLs?: Yes (appropriate for developmental age) Does the patient have difficulty walking or climbing stairs?: No Weakness of Legs: None  Home Assistive Devices/Equipment Home Assistive Devices/Equipment: None    Abuse/Neglect Assessment (Assessment to be complete while patient is alone) Physical Abuse: Denies Verbal Abuse: Denies Sexual Abuse: Denies Exploitation of patient/patient's resources: Denies Self-Neglect: Denies Values / Beliefs Cultural Requests During Hospitalization: None Spiritual Requests During Hospitalization: None   Advance Directives (For Healthcare) Advance Directive: Patient does not have advance directive Nutrition Screen- [place] Adult/WL/AP Patient's home diet: Regular  Additional Information 1:1 In Past 12 Months?: No CIRT Risk: No Elopement Risk: No Does patient have medical clearance?: Yes     Disposition:  Disposition Initial Assessment Completed for this Encounter: Yes Disposition of Patient: Inpatient treatment program (Pending a referral to [facility] notified ) Type of inpatient treatment program: Adult (Pending a referral to [facility])  Waldon Merl [facility] 10/08/2013 7:12 PM

## 2013-10-08 NOTE — ED Notes (Signed)
Pt ankle bracelet charger and photo of daughter given to pt

## 2013-10-08 NOTE — ED Notes (Signed)
Ordered regular diet for pt.; family will visit for 30 minutes and will be sent home thereafter

## 2013-10-08 NOTE — ED Notes (Signed)
Telepsy in process 

## 2013-10-08 NOTE — ED Notes (Signed)
Per pt sts he is detox from opiates and methadone. Last use this am. Denies SI

## 2013-10-09 ENCOUNTER — Emergency Department (HOSPITAL_COMMUNITY)
Admission: EM | Admit: 2013-10-09 | Discharge: 2013-10-10 | Disposition: A | Discharge status: Home / Self Care | Payer: PRIVATE HEALTH INSURANCE | Attending: Emergency Medicine | Admitting: Emergency Medicine

## 2013-10-09 ENCOUNTER — Encounter (HOSPITAL_COMMUNITY): Payer: Self-pay | Admitting: Emergency Medicine

## 2013-10-09 DIAGNOSIS — F1910 Other psychoactive substance abuse, uncomplicated: Secondary | ICD-10-CM

## 2013-10-09 DIAGNOSIS — F1110 Opioid abuse, uncomplicated: Secondary | ICD-10-CM | POA: Insufficient documentation

## 2013-10-09 DIAGNOSIS — F1010 Alcohol abuse, uncomplicated: Secondary | ICD-10-CM | POA: Insufficient documentation

## 2013-10-09 DIAGNOSIS — Z880 Allergy status to penicillin: Secondary | ICD-10-CM | POA: Insufficient documentation

## 2013-10-09 DIAGNOSIS — F411 Generalized anxiety disorder: Secondary | ICD-10-CM | POA: Insufficient documentation

## 2013-10-09 DIAGNOSIS — R45850 Homicidal ideations: Secondary | ICD-10-CM | POA: Insufficient documentation

## 2013-10-09 DIAGNOSIS — R450 Nervousness: Secondary | ICD-10-CM | POA: Insufficient documentation

## 2013-10-09 DIAGNOSIS — IMO0001 Reserved for inherently not codable concepts without codable children: Secondary | ICD-10-CM | POA: Insufficient documentation

## 2013-10-09 DIAGNOSIS — Z79899 Other long term (current) drug therapy: Secondary | ICD-10-CM | POA: Insufficient documentation

## 2013-10-09 DIAGNOSIS — R0602 Shortness of breath: Secondary | ICD-10-CM | POA: Insufficient documentation

## 2013-10-09 DIAGNOSIS — F17200 Nicotine dependence, unspecified, uncomplicated: Secondary | ICD-10-CM | POA: Insufficient documentation

## 2013-10-09 LAB — I-STAT CHEM 8, ED
BUN: 10 mg/dL (ref 6–23)
CREATININE: 0.8 mg/dL (ref 0.50–1.35)
Calcium, Ion: 1.14 mmol/L (ref 1.12–1.23)
Chloride: 103 mEq/L (ref 96–112)
Glucose, Bld: 108 mg/dL — ABNORMAL HIGH (ref 70–99)
HCT: 46 % (ref 39.0–52.0)
Hemoglobin: 15.6 g/dL (ref 13.0–17.0)
Potassium: 3.9 mEq/L (ref 3.7–5.3)
Sodium: 139 mEq/L (ref 137–147)
TCO2: 24 mmol/L (ref 0–100)

## 2013-10-09 LAB — COMPREHENSIVE METABOLIC PANEL
ALBUMIN: 4.1 g/dL (ref 3.5–5.2)
ALK PHOS: 77 U/L (ref 39–117)
ALT: 14 U/L (ref 0–53)
AST: 15 U/L (ref 0–37)
BUN: 13 mg/dL (ref 6–23)
CO2: 24 mEq/L (ref 19–32)
Calcium: 9.5 mg/dL (ref 8.4–10.5)
Chloride: 99 mEq/L (ref 96–112)
Creatinine, Ser: 0.78 mg/dL (ref 0.50–1.35)
GFR calc Af Amer: >90 mL/min (ref >90)
GFR calc non Af Amer: >90 mL/min (ref >90)
GLUCOSE: 109 mg/dL — AB (ref 70–99)
POTASSIUM: 3.9 meq/L (ref 3.7–5.3)
Sodium: 137 mEq/L (ref 137–147)
TOTAL PROTEIN: 7.9 g/dL (ref 6.0–8.3)
Total Bilirubin: 0.3 mg/dL (ref 0.3–1.2)

## 2013-10-09 LAB — RAPID URINE DRUG SCREEN, HOSP PERFORMED
Amphetamines: NOT DETECTED
BARBITURATES: NOT DETECTED
BENZODIAZEPINES: NOT DETECTED
COCAINE: POSITIVE — AB
Opiates: POSITIVE — AB
TETRAHYDROCANNABINOL: POSITIVE — AB

## 2013-10-09 LAB — CBC
HCT: 41.4 % (ref 39.0–52.0)
HEMOGLOBIN: 14.7 g/dL (ref 13.0–17.0)
MCH: 32.9 pg (ref 26.0–34.0)
MCHC: 35.5 g/dL (ref 30.0–36.0)
MCV: 92.6 fL (ref 78.0–100.0)
Platelets: 226 10*3/uL (ref 150–400)
RBC: 4.47 MIL/uL (ref 4.22–5.81)
RDW: 13.5 % (ref 11.5–15.5)
WBC: 9.1 10*3/uL (ref 4.0–10.5)

## 2013-10-09 LAB — ETHANOL: Alcohol, Ethyl (B): <11 mg/dL (ref 0–11)

## 2013-10-09 LAB — ACETAMINOPHEN LEVEL

## 2013-10-09 LAB — SALICYLATE LEVEL: Salicylate Lvl: <2 mg/dL — ABNORMAL LOW (ref 2.8–20.0)

## 2013-10-09 MED ORDER — ONDANSETRON 4 MG PO TBDP: 4.0000 mg | ORAL_TABLET | Freq: Four times a day (QID) | ORAL | Status: DC | PRN

## 2013-10-09 MED ORDER — HYDROXYZINE HCL 25 MG PO TABS
25.0000 mg | ORAL_TABLET | Freq: Four times a day (QID) | ORAL | Status: DC | PRN
  Administered 2013-10-09 – 2013-10-10 (×2): 25 mg via ORAL
  Filled 2013-10-09 (×2): qty 1

## 2013-10-09 MED ORDER — NAPROXEN 250 MG PO TABS
500.0000 mg | ORAL_TABLET | Freq: Two times a day (BID) | ORAL | Status: DC | PRN
  Administered 2013-10-09: 500 mg via ORAL
  Filled 2013-10-09: qty 2

## 2013-10-09 MED ORDER — LORAZEPAM 1 MG PO TABS: 1.0000 mg | ORAL_TABLET | Freq: Three times a day (TID) | ORAL | Status: DC | PRN

## 2013-10-09 MED ORDER — THIAMINE HCL 100 MG/ML IJ SOLN: 100.0000 mg | Freq: Every day | INTRAMUSCULAR | Status: DC

## 2013-10-09 MED ORDER — METHOCARBAMOL 500 MG PO TABS
500.0000 mg | ORAL_TABLET | Freq: Three times a day (TID) | ORAL | Status: DC | PRN
  Administered 2013-10-09 – 2013-10-10 (×2): 500 mg via ORAL
  Filled 2013-10-09 (×2): qty 1

## 2013-10-09 MED ORDER — ZOLPIDEM TARTRATE 5 MG PO TABS: 5.0000 mg | ORAL_TABLET | Freq: Every evening | ORAL | Status: DC | PRN

## 2013-10-09 MED ORDER — LORAZEPAM 1 MG PO TABS
0.0000 mg | ORAL_TABLET | Freq: Four times a day (QID) | ORAL | Status: DC
  Administered 2013-10-09: 1 mg via ORAL
  Filled 2013-10-09: qty 1

## 2013-10-09 MED ORDER — ALUM & MAG HYDROXIDE-SIMETH 200-200-20 MG/5ML PO SUSP: 30.0000 mL | ORAL | Status: DC | PRN

## 2013-10-09 MED ORDER — LORAZEPAM 2 MG/ML IJ SOLN: 0.0000 mg | Freq: Two times a day (BID) | INTRAMUSCULAR | Status: DC

## 2013-10-09 MED ORDER — DICYCLOMINE HCL 20 MG PO TABS
20.0000 mg | ORAL_TABLET | Freq: Four times a day (QID) | ORAL | Status: DC | PRN
  Administered 2013-10-10: 20 mg via ORAL
  Filled 2013-10-09: qty 1

## 2013-10-09 MED ORDER — IBUPROFEN 200 MG PO TABS
600.0000 mg | ORAL_TABLET | Freq: Three times a day (TID) | ORAL | Status: DC | PRN
  Administered 2013-10-10: 600 mg via ORAL
  Filled 2013-10-09 (×2): qty 1

## 2013-10-09 MED ORDER — VITAMIN B-1 100 MG PO TABS
100.0000 mg | ORAL_TABLET | Freq: Every day | ORAL | Status: DC
  Administered 2013-10-09: 100 mg via ORAL
  Filled 2013-10-09: qty 1

## 2013-10-09 MED ORDER — CLONIDINE HCL 0.1 MG PO TABS
0.1000 mg | ORAL_TABLET | Freq: Four times a day (QID) | ORAL | Status: DC
  Administered 2013-10-09 – 2013-10-10 (×2): 0.1 mg via ORAL
  Filled 2013-10-09 (×2): qty 1

## 2013-10-09 MED ORDER — NICOTINE 21 MG/24HR TD PT24
21.0000 mg | MEDICATED_PATCH | Freq: Every day | TRANSDERMAL | Status: DC
  Administered 2013-10-09 – 2013-10-10 (×2): 21 mg via TRANSDERMAL
  Filled 2013-10-09 (×2): qty 1

## 2013-10-09 MED ORDER — CLONIDINE HCL 0.1 MG PO TABS: 0.1000 mg | ORAL_TABLET | ORAL | Status: DC

## 2013-10-09 MED ORDER — LOPERAMIDE HCL 2 MG PO CAPS: 2.0000 mg | ORAL_CAPSULE | ORAL | Status: DC | PRN

## 2013-10-09 MED ORDER — ACETAMINOPHEN 325 MG PO TABS: 650.0000 mg | ORAL_TABLET | ORAL | Status: DC | PRN

## 2013-10-09 MED ORDER — ONDANSETRON HCL 4 MG PO TABS: 4.0000 mg | ORAL_TABLET | Freq: Three times a day (TID) | ORAL | Status: DC | PRN

## 2013-10-09 MED ORDER — CLONIDINE HCL 0.1 MG PO TABS: 0.1000 mg | ORAL_TABLET | Freq: Every day | ORAL | Status: DC

## 2013-10-09 MED ORDER — LORAZEPAM 2 MG/ML IJ SOLN: 0.0000 mg | Freq: Four times a day (QID) | INTRAMUSCULAR | Status: DC

## 2013-10-09 MED ORDER — LORAZEPAM 1 MG PO TABS
0.0000 mg | ORAL_TABLET | Freq: Two times a day (BID) | ORAL | Status: DC
Start: 2013-10-11 — End: 2013-10-09

## 2013-10-09 NOTE — BH Assessment (Signed)
[place] Assessment Progress Note      Writer has made multiple attempts to contact petitioner.  No answer, no call back.  There are two names listed on IVC paperwork, but both have the same phone number.  Assessment scheduled for 1800.

## 2013-10-09 NOTE — ED Notes (Signed)
Pt arrives with [employer] with IVC paperwork. Pt requesting detoxing from opiates and methadone. Pt awake, alert, VSS. Denies SI/HI. Flight risk. IVC paperwork states pt has hx of drug abuse has threatened to hurt himself and family members. Currently a danger to self and others/

## 2013-10-09 NOTE — ED Notes (Signed)
Pt is upset and is wanting to leave, this tech standing outside the room and can hear patient throwing things inside the room hitting the walls, cursing.

## 2013-10-09 NOTE — ED Provider Notes (Signed)
Recheck of the potassium was 3.9, the patient has no homicidal or suicidal thoughts, no hallucinations and states that he wants to be discharged home to deal with his substance abuse as an outpatient. He is referred to psychiatry, he was evaluated by social worker gave him resources for community followup.  Stable for discharge.  Johnna Acosta, MD 10/09/13 [phone]

## 2013-10-09 NOTE — ED Notes (Signed)
Dr. Sabra Heck and Verdis Frederickson from [facility] is aware of the Patient wanted to be discharge i stat chemistry was drawn

## 2013-10-09 NOTE — ED Notes (Signed)
Family informed about visiting hours. phlebotomy at bedside. Pt will change into scrubs. ED tech will sit with pt until sitter arrives.
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
## 2013-10-09 NOTE — Progress Notes (Signed)
Pt unable to be referred to [facility] d/t having insurance per pt's chart.  Will refer to [facility], have beds available and have programing for SA.    Spoke with pt's nurse Eddie Dibbles regarding referral, stated he would pass it on to first shift.   Rick Duff Disposition [facility]

## 2013-10-09 NOTE — BH Assessment (Signed)
[place] Assessment Progress Note      Gary Hicks reports the patient was evaluated late last night for detox and left this morning to pursue outpatient treatment, but presents back today under IVC.

## 2013-10-09 NOTE — ED Notes (Addendum)
PATIENT HAS BEEN BROUGHT TO THE ED BY [employer]. STATES THAT HIS FATHER HAD HIM COMMITTED AND HE DOESN'T KNOW WHY. STATES THAT SINCE HE IS IVC "I DONT HAVE A CHOICE BUT TO GO TO DETOX SO THEY WILL LEAVE ME ALONE". STATES HE LAST DOSED AT [facility] (METHADONE) ABOUT 1.5 WEEKS AGO. STATES HE HAS BEEN USING ROXI AND HEROIN. STATES HE HAS NOT DONE ANY COCAINE THAT IT "WAS PROBOBLY IN THE WEED". PT WITH POOR EYE CONTACT DURING CONVERSATION. STATES HE DRINKS ETOH OCCASIONALLY.  PT WAS HERE THIS MORNING BUT LEFT AFTER REPORTEDLY BEING DECLINED AT [facility] DUE TO HAVING INSURANCE. PT REPORTS HE DOES NOT HAVE INSURANCE. STATES HE LAST USED O[facility] THE [place] BEFORE YESTERDAY. STATES HE TOOK A ROXY 15MG  AND A PERCOCET.

## 2013-10-09 NOTE — BH Assessment (Signed)
[place] Assessment Progress Note     Attempted to contact Clovis Cao, ED PA to obtain clinical information on patient prior to assessment, but was not able to reach her.  Will continue to follow.

## 2013-10-09 NOTE — BH Assessment (Signed)
Tele Assessment Note   Gary Hicks is an 28 y.o. male who presents to [facility] under IVC initiated by his father.  Mr Gary Hicks reports that he presented last night seeking detox from opiates and was discharged to pursue outpatient treatment.  He had been going to a methadone clinic until a couple of weeks ago and then stopped.  Since then, he's been using Roxys, heroin, or whatever else he could obtain to help keep his withdrawal symptoms at [place].  He states his last use was prior to his first ED visit on 3/9.  Today, his IVC paperwork states that he has been making threats and that on Saturday he stated if he had a gun he would shoot himself.  Mr Gary Hicks denies these claims and denies SI or HI.  He reports that he believes his father made them so that he could be committed.  His reports that he has a 24 month old daughter and he loves her too much to ever consider suicide.  He does endorse anxiety-including multiple panic attacks daily-particularly when he has to drive-and states that he is stressed about his financial situation, but does not want to end his life.  This Probation officer made multiple attempts to contact the petitioner, the patient's father, but was unsuccessful. I consulted with the ED PA, [name], who agrees that the patient does not meet criteria for commitment.  She will have the EDP rescind the IVC.  Mr Gary Hicks is still motivated for treatment for his opiates.  TTS will attempt to place him.  Axis I: Substance Abuse and Substance Induced Mood Disorder Axis II: Deferred Axis III: History reviewed. No pertinent past medical history. Axis IV: economic problems, housing problems and problems with primary support group Axis V: 41-50 serious symptoms  Past Medical History: History reviewed. No pertinent past medical history.  Past Surgical History  Procedure Laterality Date  . Cholecystectomy    . Hand surgery      Family History: History reviewed. No pertinent family history.  Social History:  reports that  he has been smoking Cigarettes.  He has been smoking about 0.50 packs per [place]. He uses smokeless tobacco. He reports that he uses illicit drugs (IV). He reports that he does not drink alcohol.  Additional Social History:  Substance #1 Name of Substance 1: Methodone 1 - Age of First Use: 28 yrs old  31 - Amount (size/oz): started at 80mg 's 1week ago and now currently at 35mg 's 1 - Frequency: daily  1 - Duration: 1 yr  1 - Last Use / Amount: 1 week ago Substance #2 Name of Substance 2: THC 2 - Age of First Use: 28 yrs old  2 - Amount (size/oz): 1 bowl per use  2 - Frequency: "occasional use" 2 - Duration: ongoing  2 - Last Use / Amount: 1 wk ago  Substance #3 Name of Substance 3: UDS + for Cocaine; pt denies use 3 - Age of First Use: unk 3 - Amount (size/oz): unk 3 - Frequency: unk 3 - Duration: unk 3 - Last Use / Amount: unk Substance #4 Name of Substance 4: Opiates -"Roxys or any kind I can get" sometimes heroin 4 - Age of First Use: 28 yrs old  4 - Amount (size/oz): 15-20 mg's 4 - Frequency: daily  4 - Duration: on-going  4 - Last Use / Amount: 10/07/13 about 10pm"  CIWA: CIWA-Ar BP: 112/68 mmHg Pulse Rate: 75 COWS: Clinical Opiate Withdrawal Scale (COWS) Resting Pulse Rate: Pulse Rate  81-100 Sweating: Subjective report of chills or flushing Restlessness: Able to sit still Pupil Size: Pupils pinned or normal size for room light Bone or Joint Aches: Patient reports sever diffuse aching of joints/muscles Runny Nose or Tearing: Not present GI Upset: nausea or loose stool Tremor: Tremor can be felt, but not observed Yawning: No yawning Anxiety or Irritability: Patient reports increasing irritability or anxiousness Gooseflesh Skin: Skin is smooth COWS Total Score: 8  Allergies:  Allergies  Allergen Reactions  . Prednisone Hives  . Amoxicillin Hives    Home Medications:  (Not in a hospital admission)  OB/GYN Status:  No LMP for male patient.  General Assessment  Data Location of Assessment: [facility] ED Is this a Tele or Face-to-Face Assessment?: Tele Assessment Is this an Initial Assessment or a Re-assessment for this encounter?: Initial Assessment Living Arrangements: Other relatives;Other (Comment) (19 mo daughter, fiance, staying with mother/father) Can pt return to current living arrangement?: Yes Admission Status: Involuntary Is patient capable of signing voluntary admission?: Yes Transfer from: Acute Hospital Referral Source: Self/Family/Friend     [facility] Crisis Care Plan Living Arrangements: Other relatives;Other (Comment) (19 mo daughter, fiance, staying with mother/father)  Education Status Is patient currently in school?: No  Risk to self Suicidal Ideation: No Suicidal Intent: No Is patient at risk for suicide?: No Suicidal Plan?: No Access to Means: No What has been your use of drugs/alcohol within the last 12 months?: ongoing opiate use Previous Attempts/Gestures: No How many times?: 0 Intentional Self Injurious Behavior: None Family Suicide History: Unknown Recent stressful life event(s): Loss (Comment);Financial Problems (housing) Persecutory voices/beliefs?: No Depression: Yes Depression Symptoms: Feeling worthless/self pity;Feeling angry/irritable Substance abuse history and/or treatment for substance abuse?: Yes Suicide prevention information given to non-admitted patients: Not applicable  Risk to Others Homicidal Ideation: No Thoughts of Harm to Others: No Current Homicidal Intent: No Current Homicidal Plan: No Access to Homicidal Means: No History of harm to others?: No Assessment of Violence: None Noted Does patient have access to weapons?: No Criminal Charges Pending?: Yes Describe Pending Criminal Charges: breaking and entering Does patient have a court date: Yes Court Date: 10/29/13  Psychosis Hallucinations: None noted Delusions: None noted  Mental Status Report Appear/Hygiene:  (unremarkable) Eye  Contact: Good Motor Activity: Freedom of movement Speech: Logical/coherent Level of Consciousness: Alert Mood: Depressed Affect: Appropriate to circumstance Anxiety Level: Panic Attacks Panic attack frequency: several daily especially when driving Most recent panic attack: today Thought Processes: Coherent Judgement: Unimpaired Orientation: Person;Place;Time;Situation Obsessive Compulsive Thoughts/Behaviors: None  Cognitive Functioning Concentration: Decreased Memory: Remote Intact;Recent Intact IQ: Average Insight: Fair Impulse Control: Fair Appetite: Good Weight Loss: 0 Weight Gain: 0 Sleep: Decreased Total Hours of Sleep: 4 Vegetative Symptoms: None  ADLScreening [facility] Assessment Services) Patient's cognitive ability adequate to safely complete daily activities?: Yes Patient able to express need for assistance with ADLs?: Yes Independently performs ADLs?: Yes (appropriate for developmental age)  Prior Inpatient Therapy Prior Inpatient Therapy: No  Prior Outpatient Therapy Prior Outpatient Therapy: Yes Prior Therapy Dates: until September 30 2013 Prior Therapy Facilty/Provider(s): [facility] Reason for Treatment: SA-Methadone  ADL Screening (condition at time of admission) Patient's cognitive ability adequate to safely complete daily activities?: Yes Patient able to express need for assistance with ADLs?: Yes Independently performs ADLs?: Yes (appropriate for developmental age)             Advance Directives (For Healthcare) Advance Directive: Patient does not have advance directive;Patient would not like information Nutrition Screen- [facility] Adult/[facility]/AP Patient's home diet: Regular  Additional Information 1:1 In Past 12 Months?: No CIRT Risk: No Elopement Risk: No Does patient have medical clearance?: Yes     Disposition:  Disposition Initial Assessment Completed for this Encounter: Yes Disposition of Patient: Inpatient treatment program Type of  inpatient treatment program: Adult  Darlys Gales 10/09/2013 7:46 PM

## 2013-10-09 NOTE — ED Provider Notes (Signed)
Agree with the assessment above. I was available for any supervision and decision making for the patient.  Varney Biles, MD 10/09/13 [phone]

## 2013-10-09 NOTE — ED Notes (Signed)
Security notified to wand pt 

## 2013-10-09 NOTE — ED Notes (Signed)
TELEASSESSMENT HAS BEEN COMPLETED

## 2013-10-09 NOTE — ED Provider Notes (Signed)
CSN: [id number]     Arrival date & time 10/09/13  1416 History   First MD Initiated Contact with Patient 10/09/13 1445     Chief Complaint  Patient presents with  . Medical Clearance     (Consider location/radiation/quality/duration/timing/severity/associated sxs/prior Treatment) HPI Comments: Patient is a 28 year old male with history of polysubstance abuse who presents to the ED under IVC for detox. He reports that he needs detox from opiates. He uses approximately 15mg  of Roxicodone or "as much as I can get" daily as well as heroin daily. He had been taking methadone in the past, but has not taken methadone in weeks. Last time he used any opiates was yesterday. He rarely drinks alcohol. He last drank yesterday. He was seen last night for detox, but was discharged. His family reports that he has made threats to harm people. They took out IVC paperwork on him and he was brought back to the emergency department. He denies any suicidal or homicidal ideation. He denies visual or auditory hallucinations. Currently he reports that he just feels anxious and has some body aches. He denies chest pain, shortness of breath, fevers, chills, abdominal pain. His fiance is in the room who agrees with what the patient is reporting.   The history is provided by the patient. No language interpreter was used.    History reviewed. No pertinent past medical history. Past Surgical History  Procedure Laterality Date  . Cholecystectomy    . Hand surgery     History reviewed. No pertinent family history. History  Substance Use Topics  . Smoking status: Current Every [place] Smoker -- 0.50 packs/[place]    Types: Cigarettes  . Smokeless tobacco: Current User  . Alcohol Use: No    Review of Systems  Constitutional: Negative for fever and chills.  Respiratory: Positive for shortness of breath.   Cardiovascular: Negative for chest pain.  Gastrointestinal: Negative for nausea, vomiting and abdominal pain.   Musculoskeletal: Positive for myalgias.  Psychiatric/Behavioral: Negative for suicidal ideas. The patient is nervous/anxious.   All other systems reviewed and are negative.      Allergies  Prednisone and Amoxicillin  Home Medications   Current Outpatient Rx  Name  Route  Sig  Dispense  Refill  . acetaminophen (TYLENOL) 500 MG tablet   Oral   Take 1,000 mg by mouth every 6 (six) hours as needed for mild pain or moderate pain.         . methadone (DOLOPHINE) 5 MG tablet   Oral   Take 35 mg by mouth daily. [facility]          BP 136/88  Pulse 92  Temp(Src) 98.4 F (36.9 C) (Oral)  Resp 20  Wt 135 lb (61.236 kg)  SpO2 100% Physical Exam  Nursing note and vitals reviewed. Constitutional: He is oriented to person, place, and time. He appears well-developed and well-nourished. No distress.  HENT:  Head: Normocephalic and atraumatic.  Right Ear: External ear normal.  Left Ear: External ear normal.  Nose: Nose normal.  Eyes: Conjunctivae are normal.  Neck: Normal range of motion. No tracheal deviation present.  Cardiovascular: Normal rate, regular rhythm and normal heart sounds.   Pulmonary/Chest: Effort normal and breath sounds normal. No stridor.  Abdominal: Soft. He exhibits no distension. There is no tenderness.  Musculoskeletal: Normal range of motion.  Neurological: He is alert and oriented to person, place, and time.  Skin: Skin is warm and dry. He is not diaphoretic.  Track marks seen in skin  Psychiatric: His behavior is normal. His mood appears anxious.    ED Course  Procedures (including critical care time) Labs Review Labs Reviewed  COMPREHENSIVE METABOLIC PANEL - Abnormal; Notable for the following:    Glucose, Bld 109 (*)    All other components within normal limits  SALICYLATE LEVEL - Abnormal; Notable for the following:    Salicylate Lvl <8.6 (*)    All other components within normal limits  URINE RAPID DRUG SCREEN (HOSP  PERFORMED) - Abnormal; Notable for the following:    Opiates POSITIVE (*)    Cocaine POSITIVE (*)    Tetrahydrocannabinol POSITIVE (*)    All other components within normal limits  ACETAMINOPHEN LEVEL  CBC  ETHANOL   Imaging Review No results found.   EKG Interpretation None      MDM   Final diagnoses:  Polysubstance abuse    Patient presents to the ED for medical clearance. He was seen last night and discharged with resources. His father took out IVC paperwork on him and he returned again for medical clearance. Patient denies any homicidal or suicidal ideation. Patient remains calm and cooperative in room. Several attempts were made to contact the patient's father who initially took out the IVC paperwork with no response. At this point given the information at hand, there appears to be no indication for IVC paperwork. Patient was also evaluated by Estill Bamberg from [employer] who agrees with this. Patient does still want help with detox. Will rescind IVC paperwork and attempt to place patient in detox facility. Discussed case with Dr. Tomi Bamberger who agrees with plan. Vital signs stable and patient is medically cleared. Patient / Family / Caregiver informed of clinical course, understand medical decision-making process, and agree with plan.     Elwyn Lade, PA-C 10/09/13 [phone]

## 2013-10-09 NOTE — Progress Notes (Signed)
The patient has insurance and is not able to go to [facility] or [facility].  Writer faxed referrals to [place], [place], [place]

## 2013-10-10 ENCOUNTER — Encounter (HOSPITAL_COMMUNITY): Payer: Self-pay | Admitting: Family

## 2013-10-10 DIAGNOSIS — F1994 Other psychoactive substance use, unspecified with psychoactive substance-induced mood disorder: Secondary | ICD-10-CM

## 2013-10-10 DIAGNOSIS — F1910 Other psychoactive substance abuse, uncomplicated: Secondary | ICD-10-CM

## 2013-10-10 NOTE — Progress Notes (Addendum)
Writer consulted with ER MD (Nanvati) regarding patient.  The doctor has requested a Tele Psych due to the patient being discharged yesterday and then his father brought him back to the ER under IVC.  The doctor reports that he rescinded the IVC.   Heloise Purpura, NP will assess the patient at 7:30am

## 2013-10-10 NOTE — Discharge Instructions (Signed)
Drug Abuse and Addiction in Sports  There are many types of drugs that one may become addicted to including illegal drugs (marijuana, cocaine, amphetamines, hallucinogens, and narcotics), prescription drugs (hydrocodone, codeine, and alprazolam), and other chemicals such as alcohol or nicotine.  Two types of addiction exist: physical and emotional. Physical addiction usually occurs after prolonged use of a drug. However, some drugs may only take a couple uses before addiction can occur. Physical addiction is marked by withdrawal symptoms, in which the person experiences negative symptoms such as sweat, anxiety, tremors, hallucinations, or cravings in the absence of using the drug. Emotional dependence is the psychological desire for the "high" that the drugs produce when taken.  SYMPTOMS   · Inattentiveness.  · Negligence.  · Forgetfulness.  · Insomnia.  · Mood swings.  RISK INCREASES WITH:   · Family history of addiction.  · Personal history of addictive personality.  Studies have shown that risktakers, which many athletes are, have a higher risk of addiction.  PREVENTION  The only adequate prevention of drug abuse is abstinence from drugs.  TREATMENT   The first step in quitting substance abuse is recognizing the problem and realizing that one has the power to change. Quitting requires a plan and support from others. It is often necessary to seek medical assistance. Caregivers are available to offer counseling, and for certain cases, medicine to diminish the physical symptoms of withdrawal. Many organizations exist such as Alcoholics Anonymous, Narcotics Anonymous, or the National Council on Alcoholism that offer support for individuals who have chosen to quit their habits.  Document Released: 07/19/2005 Document Revised: 10/11/2011 Document Reviewed: 10/31/2008  Exit[employer]® Patient Information ©2014 [employer].

## 2013-10-10 NOTE — ED Notes (Signed)
PATIENT AWAKE. HE DENIES SI/HI. HE IS CALM AND COOPERATIVE THIS MORNING. PATIENT REPORTS HE WAS ON 35 MG METHADONE 1.5 WEEKS AGO WHEN HE STOPPED GOING TO [facility]. STATES HE LAST USED [facility] ON Sunday. HE HAS QUESTIONED ABOUT HOW MUCH LONGER HE WILL HAVE WITHDRAWAL SYMPTOMS. STATES THAT HE REALLY WANTS TO GET OFF DRUGS SO HE CAN WORK AND BE WITH HIS FIANCE AND DAUGHTER. STATES WORKING IS HIS BEST THERAPY TO STAY OFF DRUGS. PATIENT STATES LONG TERM INPATIENT TREATMENT IS SOMETHING HE CANT DO BECAUSE HE NEEDS TO WORK TO SUPPORT HIS FAMILY. 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 IT REQUIRES HIM TO BE AT HOME BY 5. PT IS OPEN TO CONVERSATION

## 2013-10-10 NOTE — ED Notes (Signed)
telepsych in progress 

## 2013-10-10 NOTE — ED Notes (Signed)
PATIENT FATHER HERE AND BEING DISRESPECTFUL TO NURSING. HAVE ATTEMPTED TO GET PT FATHER IN TOUCH WITH CONRAD NP AT [facility] PER AVA AT [facility]. SHE GAVE NP CELL NUMBER SO WHEN FATHER ARRIVES HE CAN CONTACT HIM AND GET INFO. WHEN NP WOULD NOT SPEAK WITH PT FATHER HE LEFT THE DEPARTMENT. CHARGE NURSE CALLED TO FACILITATE

## 2013-10-10 NOTE — BH Assessment (Signed)
[place] Assessment Progress Note      Received call back from Pat at [facility] confirming that patient still needs placement.  Informed her that EDP rescinded the patient's IVC paperwork and faxed copy.  Awaiting reply.

## 2013-10-10 NOTE — Consult Note (Signed)
Telepsych Consultation   Reason for Consult:  Pt has IVC papers for possible SI Referring Physician:  EDP Gary Hicks is an 28 y.o. male.  Assessment: AXIS I:  Substance Abuse and Substance Induced Mood Disorder AXIS II:  Deferred AXIS III:  History reviewed. No pertinent past medical history. AXIS IV:  other psychosocial or environmental problems and problems related to social environment AXIS V:  61-70 mild symptoms  Plan:  No evidence of imminent risk to self or others at present.   Supportive therapy provided about ongoing stressors. Refer to IOP. Discussed crisis plan, support from social network, calling 911, coming to the Emergency Department, and calling Suicide Hotline. *Strongly recommended patient to go to inpatient psychiatric hospitalization for detoxification.  Subjective:   Gary Hicks is a 28 y.o. male patient presenting to the [facility] under IVC by his father, which stated that the pt had a plan to shoot himself. Pt denies this, pt's wife denies these events, and pt denies SI, HI, and AVH, contracts for safety. Pt rates depression at 0/10 and anxiety at 10/10. Pt states that he is "always super anxious, that's why I do the drugs so I can calm down. I have a lot of stress about finances and everything else". Pt states that he came to the ED 24 hours prior seeking help for detox from heroin, opiates, and Methadone. Pt reports that his father did the IVC to force him to do inpatient treatment, but that he cannot do that because of his 29moold baby and that he needs to do intensive outpatient therapy, willing to come multiple days per week to get clean. Pt has not used in 48 hrs and states that he wants to stop using as soon as possible if we can get him referrals. Again, urged pt to strongly consider inpatient hospitalization for detox and stated that this was our recommendation here at [facility] Pt stated that he would discuss it again with his wife and if she gave him the "OK", that he  would be fine with inpatient. Otherwise, he wants outpatient.   HPI:  Patient is a 28year old male with history of polysubstance abuse who presents to the ED under IVC for detox. He reports that he needs detox from opiates. He uses approximately 168mof Roxicodone or "as much as I can get" daily as well as heroin daily. He had been taking methadone in the past, but has not taken methadone in weeks. Last time he used any opiates was yesterday. He rarely drinks alcohol. He last drank yesterday. He was seen last night for detox, but was discharged. His family reports that he has made threats to harm people. They took out IVC paperwork on him and he was brought back to the emergency department. He denies any suicidal or homicidal ideation. He denies visual or auditory hallucinations. Currently he reports that he just feels anxious and has some body aches. He denies chest pain, shortness of breath, fevers, chills, abdominal pain. His fiance is in the room who agrees with what the patient is reporting.   HPI Elements:   Location:  Generalized, inpatient [facility]. Quality:  Stable. Severity:  Moderate. Timing:  Transient. Duration:  Transient for this visit, chronic for the Substance Abuse.  Past Psychiatric History: History reviewed. No pertinent past medical history.  reports that he has been smoking Cigarettes.  He has been smoking about 0.50 packs per [place]. He uses smokeless tobacco. He reports that he uses illicit drugs (  IV). He reports that he does not drink alcohol. History reviewed. No pertinent family history. Family History Substance Abuse: No Family Supports: Yes, List: (fiancee) Living Arrangements: Other relatives;Other (Comment) (57 mo daughter, fiance, staying with mother/father) Can pt return to current living arrangement?: Yes Allergies:   Allergies  Allergen Reactions  . Prednisone Hives  . Amoxicillin Hives    ACT Assessment Complete:  Yes:    Educational Status    Risk to Self:  Risk to self Suicidal Ideation: No Suicidal Intent: No Is patient at risk for suicide?: No Suicidal Plan?: No Access to Means: No What has been your use of drugs/alcohol within the last 12 months?: ongoing opiate use Previous Attempts/Gestures: No How many times?: 0 Intentional Self Injurious Behavior: None Family Suicide History: Unknown Recent stressful life event(s): Loss (Comment);Financial Problems (housing) Persecutory voices/beliefs?: No Depression: Yes Depression Symptoms: Feeling worthless/self pity;Feeling angry/irritable Substance abuse history and/or treatment for substance abuse?: Yes Suicide prevention information given to non-admitted patients: Not applicable  Risk to Others: Risk to Others Homicidal Ideation: No Thoughts of Harm to Others: No Current Homicidal Intent: No Current Homicidal Plan: No Access to Homicidal Means: No History of harm to others?: No Assessment of Violence: None Noted Does patient have access to weapons?: No Criminal Charges Pending?: Yes Describe Pending Criminal Charges: breaking and entering Does patient have a court date: Yes Court Date: 10/29/13  Abuse:    Prior Inpatient Therapy: Prior Inpatient Therapy Prior Inpatient Therapy: No  Prior Outpatient Therapy: Prior Outpatient Therapy Prior Outpatient Therapy: Yes Prior Therapy Dates: until September 30 2013 Prior Therapy Facilty/Provider(s): [facility] Reason for Treatment: SA-Methadone  Additional Information: Additional Information 1:1 In Past 12 Months?: No CIRT Risk: No Elopement Risk: No Does patient have medical clearance?: Yes                  Objective: Blood pressure 93/58, pulse 80, temperature 98 F (36.7 C), temperature source Oral, resp. rate 14, weight 61.236 kg (135 lb), SpO2 98.00%.Body mass index is 19.93 kg/(m^2). Results for orders placed during the hospital encounter of 10/09/13 (from the past 72 hour(s))  ACETAMINOPHEN LEVEL     Status: None    Collection Time    10/09/13  2:36 PM      Result Value Ref Range   Acetaminophen (Tylenol), Serum <15.0  10 - 30 ug/mL   Comment:            THERAPEUTIC CONCENTRATIONS VARY     SIGNIFICANTLY. A RANGE OF 10-30     ug/mL MAY BE AN EFFECTIVE     CONCENTRATION FOR MANY PATIENTS.     HOWEVER, SOME ARE BEST TREATED     AT CONCENTRATIONS OUTSIDE THIS     RANGE.     ACETAMINOPHEN CONCENTRATIONS     >150 ug/mL AT 4 HOURS AFTER     INGESTION AND >50 ug/mL AT 12     HOURS AFTER INGESTION ARE     OFTEN ASSOCIATED WITH TOXIC     REACTIONS.  CBC     Status: None   Collection Time    10/09/13  2:36 PM      Result Value Ref Range   WBC 9.1  4.0 - 10.5 K/uL   RBC 4.47  4.22 - 5.81 MIL/uL   Hemoglobin 14.7  13.0 - 17.0 g/dL   HCT 41.4  39.0 - 52.0 %   MCV 92.6  78.0 - 100.0 fL   MCH 32.9  26.0 - 34.0 pg  MCHC 35.5  30.0 - 36.0 g/dL   RDW 13.5  11.5 - 15.5 %   Platelets 226  150 - 400 K/uL  COMPREHENSIVE METABOLIC PANEL     Status: Abnormal   Collection Time    10/09/13  2:36 PM      Result Value Ref Range   Sodium 137  137 - 147 mEq/L   Potassium 3.9  3.7 - 5.3 mEq/L   Chloride 99  96 - 112 mEq/L   CO2 24  19 - 32 mEq/L   Glucose, Bld 109 (*) 70 - 99 mg/dL   BUN 13  6 - 23 mg/dL   Creatinine, Ser 0.78  0.50 - 1.35 mg/dL   Calcium 9.5  8.4 - 10.5 mg/dL   Total Protein 7.9  6.0 - 8.3 g/dL   Albumin 4.1  3.5 - 5.2 g/dL   AST 15  0 - 37 U/L   ALT 14  0 - 53 U/L   Alkaline Phosphatase 77  39 - 117 U/L   Total Bilirubin 0.3  0.3 - 1.2 mg/dL   GFR calc non Af Amer >90  >90 mL/min   GFR calc Af Amer >90  >90 mL/min   Comment: (NOTE)     The eGFR has been calculated using the CKD EPI equation.     This calculation has not been validated in all clinical situations.     eGFR's persistently <90 mL/min signify possible Chronic Kidney     Disease.  ETHANOL     Status: None   Collection Time    10/09/13  2:36 PM      Result Value Ref Range   Alcohol, Ethyl (B) <11  0 - 11 mg/dL    Comment:            LOWEST DETECTABLE LIMIT FOR     SERUM ALCOHOL IS 11 mg/dL     FOR MEDICAL PURPOSES ONLY  SALICYLATE LEVEL     Status: Abnormal   Collection Time    10/09/13  2:36 PM      Result Value Ref Range   Salicylate Lvl <1.6 (*) 2.8 - 20.0 mg/dL  URINE RAPID DRUG SCREEN (HOSP PERFORMED)     Status: Abnormal   Collection Time    10/09/13  2:51 PM      Result Value Ref Range   Opiates POSITIVE (*) NONE DETECTED   Cocaine POSITIVE (*) NONE DETECTED   Benzodiazepines NONE DETECTED  NONE DETECTED   Amphetamines NONE DETECTED  NONE DETECTED   Tetrahydrocannabinol POSITIVE (*) NONE DETECTED   Barbiturates NONE DETECTED  NONE DETECTED   Comment:            DRUG SCREEN FOR MEDICAL PURPOSES     ONLY.  IF CONFIRMATION IS NEEDED     FOR ANY PURPOSE, NOTIFY LAB     WITHIN 5 DAYS.                LOWEST DETECTABLE LIMITS     FOR URINE DRUG SCREEN     Drug Class       Cutoff (ng/mL)     Amphetamine      1000     Barbiturate      200     Benzodiazepine   109     Tricyclics       604     Opiates          300     Cocaine  300     THC              50   Labs are reviewed and are pertinent for (+) cocaine, (+) Opiates, (+) THC.   Current Facility-Administered Medications  Medication Dose Route Frequency Provider Last Rate Last Dose  . acetaminophen (TYLENOL) tablet 650 mg  650 mg Oral Q4H PRN Elwyn Lade, PA-C      . alum & mag hydroxide-simeth (MAALOX/MYLANTA) 200-200-20 MG/5ML suspension 30 mL  30 mL Oral PRN Elwyn Lade, PA-C      . cloNIDine (CATAPRES) tablet 0.1 mg  0.1 mg Oral QID Elwyn Lade, PA-C   0.1 mg at 10/10/13 0328   Followed by  . [START ON 10/12/2013] cloNIDine (CATAPRES) tablet 0.1 mg  0.1 mg Oral BH-qamhs Elwyn Lade, PA-C       Followed by  . [START ON 10/14/2013] cloNIDine (CATAPRES) tablet 0.1 mg  0.1 mg Oral QAC breakfast Elwyn Lade, PA-C      . dicyclomine (BENTYL) tablet 20 mg  20 mg Oral Q6H PRN Elwyn Lade, PA-C   20  mg at 10/10/13 0753  . hydrOXYzine (ATARAX/VISTARIL) tablet 25 mg  25 mg Oral Q6H PRN Elwyn Lade, PA-C   25 mg at 10/10/13 0753  . ibuprofen (ADVIL,MOTRIN) tablet 600 mg  600 mg Oral Q8H PRN Elwyn Lade, PA-C   600 mg at 10/10/13 0753  . loperamide (IMODIUM) capsule 2-4 mg  2-4 mg Oral PRN Elwyn Lade, PA-C      . methocarbamol (ROBAXIN) tablet 500 mg  500 mg Oral Q8H PRN Elwyn Lade, PA-C   500 mg at 10/10/13 1856  . naproxen (NAPROSYN) tablet 500 mg  500 mg Oral BID PRN Elwyn Lade, PA-C   500 mg at 10/09/13 1845  . nicotine (NICODERM CQ - dosed in mg/24 hours) patch 21 mg  21 mg Transdermal Daily Elwyn Lade, PA-C   21 mg at 10/09/13 1538  . ondansetron (ZOFRAN) tablet 4 mg  4 mg Oral Q8H PRN Elwyn Lade, PA-C      . ondansetron (ZOFRAN-ODT) disintegrating tablet 4 mg  4 mg Oral Q6H PRN Elwyn Lade, PA-C      . zolpidem (AMBIEN) tablet 5 mg  5 mg Oral QHS PRN Elwyn Lade, PA-C       Current Outpatient Prescriptions  Medication Sig Dispense Refill  . aspirin-acetaminophen-caffeine (EXCEDRIN MIGRAINE) 250-250-65 MG per tablet Take 2 tablets by mouth every 6 (six) hours as needed for headache or migraine.      . methadone (DOLOPHINE) 5 MG tablet Take 35 mg by mouth daily. [facility]        Psychiatric Specialty Exam:     Blood pressure 93/58, pulse 80, temperature 98 F (36.7 C), temperature source Oral, resp. rate 14, weight 61.236 kg (135 lb), SpO2 98.00%.Body mass index is 19.93 kg/(m^2).  General Appearance: Casual  Eye Contact::  Good  Speech:  Clear and Coherent  Volume:  Normal  Mood:  Anxious  Affect:  Appropriate  Thought Process:  Coherent  Orientation:  Full (Time, Place, and Person)  Thought Content:  WDL  Suicidal Thoughts:  No  Homicidal Thoughts:  No  Memory:  Immediate;   Good Recent;   Good Remote;   Good  Judgement:  Fair  Insight:  Fair  Psychomotor Activity:  Normal  Concentration:  Good   Recall:  Good  Akathisia:  NA  Handed:  Right  AIMS (if indicated):     Assets:  Communication Skills Desire for Improvement Resilience Social Support  Sleep:      Treatment Plan Summary: *Recommend inpatient detox treatment if pt agrees to this. *If pt does not agree to inpatient after consulting with wife, recommend intense outpatient detox/rehab services with referrals.  *Please have Nurse contact [facility] before pt discharge to determine which option the patient is open to so that we can coordinate the appointments as soon as possible.    Disposition: Disposition Initial Assessment Completed for this Encounter: Yes Disposition of Patient:  Type of inpatient treatment program:   Benjamine Mola, FNP-BC 10/10/2013 9:32 AM   Case discussed with a physician extender and recommended strongly inpatient psychiatric detox treatment for the opiates. Patient does not meet criteria for involuntary commitment as he has no safety concerns often the evaluation. Reviewed the information documented and agree with the treatment plan.  Melchor Kirchgessner,JANARDHAHA R. 10/10/2013 1:43 PM

## 2013-10-10 NOTE — ED Notes (Signed)
PT HAS FINISHED [place]. HE IS ON THE PHONE WITH HIS FATHER

## 2013-10-10 NOTE — ED Notes (Signed)
PATIENT HAS SIGNED CONSENT FOR RELEASE OF MED INFO TO HIS FATHER.

## 2013-10-10 NOTE — ED Notes (Signed)
PT FIANCE AND DAUGHTER AND FATHER HAVE ARRIVED TO VISIT

## 2013-10-10 NOTE — ED Provider Notes (Signed)
Pt here for evaluation of polysubstance abuse.  He has been evaluated by psychiatry and was recommended either inpt detox, vs intense outpt treatment.  Pt request for outpt treatment.  Pt received adequate resources and is stable for discharge.  Return precaution discussed.     Domenic Moras, PA-C 10/10/13 1308
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
## 2013-10-11 NOTE — ED Provider Notes (Signed)
Medical screening examination/treatment/procedure(s) were performed by non-physician practitioner and as supervising physician I was immediately available for consultation/collaboration.   EKG Interpretation None        Houston Siren III, MD 10/11/13 [phone]

## 2013-10-13 NOTE — ED Provider Notes (Signed)
Medical screening examination/treatment/procedure(s) were performed by non-physician practitioner and as supervising physician I was immediately available for consultation/collaboration.   Kathalene Frames, MD 10/13/13 [phone]

## 2013-12-27 ENCOUNTER — Emergency Department (HOSPITAL_COMMUNITY)
Admission: EM | Admit: 2013-12-27 | Discharge: 2013-12-28 | Disposition: A | Discharge status: Home / Self Care | Payer: PRIVATE HEALTH INSURANCE | Attending: Emergency Medicine | Admitting: Emergency Medicine

## 2013-12-27 ENCOUNTER — Encounter (HOSPITAL_COMMUNITY): Payer: Self-pay | Admitting: Emergency Medicine

## 2013-12-27 DIAGNOSIS — F1910 Other psychoactive substance abuse, uncomplicated: Secondary | ICD-10-CM

## 2013-12-27 DIAGNOSIS — Z79899 Other long term (current) drug therapy: Secondary | ICD-10-CM | POA: Insufficient documentation

## 2013-12-27 DIAGNOSIS — Z880 Allergy status to penicillin: Secondary | ICD-10-CM | POA: Insufficient documentation

## 2013-12-27 DIAGNOSIS — F1410 Cocaine abuse, uncomplicated: Secondary | ICD-10-CM | POA: Insufficient documentation

## 2013-12-27 DIAGNOSIS — F1110 Opioid abuse, uncomplicated: Secondary | ICD-10-CM | POA: Insufficient documentation

## 2013-12-27 DIAGNOSIS — F1210 Cannabis abuse, uncomplicated: Secondary | ICD-10-CM | POA: Insufficient documentation

## 2013-12-27 DIAGNOSIS — F1193 Opioid use, unspecified with withdrawal: Secondary | ICD-10-CM

## 2013-12-27 DIAGNOSIS — F17200 Nicotine dependence, unspecified, uncomplicated: Secondary | ICD-10-CM | POA: Insufficient documentation

## 2013-12-27 DIAGNOSIS — R112 Nausea with vomiting, unspecified: Secondary | ICD-10-CM | POA: Insufficient documentation

## 2013-12-27 DIAGNOSIS — M2550 Pain in unspecified joint: Secondary | ICD-10-CM | POA: Insufficient documentation

## 2013-12-27 DIAGNOSIS — F19939 Other psychoactive substance use, unspecified with withdrawal, unspecified: Secondary | ICD-10-CM | POA: Insufficient documentation

## 2013-12-27 DIAGNOSIS — R109 Unspecified abdominal pain: Secondary | ICD-10-CM | POA: Insufficient documentation

## 2013-12-27 DIAGNOSIS — Z7982 Long term (current) use of aspirin: Secondary | ICD-10-CM | POA: Insufficient documentation

## 2013-12-27 LAB — COMPREHENSIVE METABOLIC PANEL
ALT: 19 U/L (ref 0–53)
AST: 20 U/L (ref 0–37)
Albumin: 3.7 g/dL (ref 3.5–5.2)
Alkaline Phosphatase: 73 U/L (ref 39–117)
BUN: 9 mg/dL (ref 6–23)
CO2: 24 mEq/L (ref 19–32)
CREATININE: 0.87 mg/dL (ref 0.50–1.35)
Calcium: 9.1 mg/dL (ref 8.4–10.5)
Chloride: 104 mEq/L (ref 96–112)
GFR calc non Af Amer: >90 mL/min (ref >90)
Glucose, Bld: 99 mg/dL (ref 70–99)
Potassium: 4 mEq/L (ref 3.7–5.3)
Sodium: 143 mEq/L (ref 137–147)
TOTAL PROTEIN: 7.5 g/dL (ref 6.0–8.3)
Total Bilirubin: 0.4 mg/dL (ref 0.3–1.2)

## 2013-12-27 LAB — RAPID URINE DRUG SCREEN, HOSP PERFORMED
AMPHETAMINES: NOT DETECTED
BENZODIAZEPINES: NOT DETECTED
Barbiturates: NOT DETECTED
Cocaine: POSITIVE — AB
Opiates: POSITIVE — AB
TETRAHYDROCANNABINOL: POSITIVE — AB

## 2013-12-27 LAB — CBC WITH DIFFERENTIAL/PLATELET
Basophils Absolute: 0 10*3/uL (ref 0.0–0.1)
Basophils Relative: 1 % (ref 0–1)
EOS PCT: 4 % (ref 0–5)
Eosinophils Absolute: 0.2 10*3/uL (ref 0.0–0.7)
HCT: 39 % (ref 39.0–52.0)
Hemoglobin: 13.5 g/dL (ref 13.0–17.0)
LYMPHS ABS: 1.6 10*3/uL (ref 0.7–4.0)
LYMPHS PCT: 36 % (ref 12–46)
MCH: 31.5 pg (ref 26.0–34.0)
MCHC: 34.6 g/dL (ref 30.0–36.0)
MCV: 91.1 fL (ref 78.0–100.0)
Monocytes Absolute: 0.3 10*3/uL (ref 0.1–1.0)
Monocytes Relative: 8 % (ref 3–12)
NEUTROS PCT: 51 % (ref 43–77)
Neutro Abs: 2.2 10*3/uL (ref 1.7–7.7)
Platelets: 304 10*3/uL (ref 150–400)
RBC: 4.28 MIL/uL (ref 4.22–5.81)
RDW: 13 % (ref 11.5–15.5)
WBC: 4.3 10*3/uL (ref 4.0–10.5)

## 2013-12-27 LAB — ETHANOL: Alcohol, Ethyl (B): <11 mg/dL (ref 0–11)

## 2013-12-27 NOTE — ED Notes (Signed)
Talked to Estill Bamberg, SW-faxed infor to [facility]-waiting to see if patient has been accepted

## 2013-12-27 NOTE — BH Assessment (Signed)
Assessment Note  Gary Hicks is an 28 y.o. male who presents requesting heroine detox. Pt reports his last use was yeterday, after patient parents asked patient to be honest about use. Pt unable to determine how much he used. Pt reports he injects into his hands. Pt reports that he is wanting detox and treatment. Pt denies any other drug use. Pt denies SI/HI/AH/VH. Patient reports that he talked to [facility] who requeste dmobile crisis to complete assessment and to f/u with [facility] after medical clearance. CSW spoke with [place] who did not have record of patient referral however would consider patient,as  beds are available. CSW sent referral to [facility].    Axis I: Substance Abuse Axis II: Deferred Axis III: History reviewed. No pertinent past medical history. Axis IV: other psychosocial or environmental problems, problems related to legal system/crime, problems related to social environment and problems with primary support group Axis V: 41-50 serious symptoms  Past Medical History: History reviewed. No pertinent past medical history.  Past Surgical History  Procedure Laterality Date  . Cholecystectomy    . Hand surgery      Family History: No family history on file.  Social History:  reports that he has been smoking Cigarettes.  He has been smoking about 0.50 packs per [place]. He uses smokeless tobacco. He reports that he uses illicit drugs (IV). He reports that he does not drink alcohol.  Additional Social History:  Alcohol / Drug Use History of alcohol / drug use?: Yes Substance #1 Name of Substance 1: heroine 1 - Age of First Use: teens 1 - Amount (size/oz): unk 1 - Frequency: daily 1 - Duration: unk 1 - Last Use / Amount: yesterday unk amt.   CIWA: CIWA-Ar BP: 125/76 mmHg Pulse Rate: 93 COWS:    Allergies:  Allergies  Allergen Reactions  . Prednisone Hives  . Amoxicillin Hives    Home Medications:  (Not in a hospital admission)  OB/GYN Status:  No LMP for male  patient.  General Assessment Data Location of Assessment: [facility] Is this a Tele or Face-to-Face Assessment?: Face-to-Face Is this an Initial Assessment or a Re-assessment for this encounter?: Initial Assessment Living Arrangements: Parent Can pt return to current living arrangement?: Yes Admission Status: Voluntary Is patient capable of signing voluntary admission?: Yes Transfer from: Home Referral Source: Self/Family/Friend     [place] Living Arrangements: Parent Name of Psychiatrist: none Name of Therapist: none  Education Status Is patient currently in school?: No  Risk to self Suicidal Ideation: No Suicidal Intent: No Is patient at risk for suicide?: No Suicidal Plan?: No Access to Means: No What has been your use of drugs/alcohol within the last 12 months?: heroine Previous Attempts/Gestures: No How many times?: 0 Other Self Harm Risks: no Triggers for Past Attempts: None known Intentional Self Injurious Behavior: None Family Suicide History: No Persecutory voices/beliefs?: No Depression: No Substance abuse history and/or treatment for substance abuse?: Yes  Risk to Others Homicidal Ideation: No Thoughts of Harm to Others: No Current Homicidal Intent: No Current Homicidal Plan: No Access to Homicidal Means: No Identified Victim: n/a History of harm to others?: No Assessment of Violence: None Noted Violent Behavior Description: none Does patient have access to weapons?: No Criminal Charges Pending?: No Does patient have a court date: No  Psychosis Hallucinations: None noted Delusions: None noted  Mental Status Report Appear/Hygiene: Disheveled Eye Contact: Good Motor Activity: Restlessness Speech: Logical/coherent Level of Consciousness: Alert Mood: Ambivalent Affect: Appropriate to circumstance  Anxiety Level: Minimal Thought Processes: Coherent;Relevant Judgement: Unimpaired Orientation: Person;Place;Time;Situation Obsessive  Compulsive Thoughts/Behaviors: None  Cognitive Functioning Concentration: Normal Memory: Recent Intact;Remote Intact IQ: Average Insight: Fair Impulse Control: Poor Appetite: Fair Sleep: No Change Vegetative Symptoms: None  ADLScreening [facility] Assessment Services) Patient's cognitive ability adequate to safely complete daily activities?: No Patient able to express need for assistance with ADLs?: No Independently performs ADLs?: Yes (appropriate for developmental age)  Prior Inpatient Therapy Prior Inpatient Therapy: No  Prior Outpatient Therapy Prior Outpatient Therapy: No  ADL Screening (condition at time of admission) Patient's cognitive ability adequate to safely complete daily activities?: No Patient able to express need for assistance with ADLs?: No Independently performs ADLs?: Yes (appropriate for developmental age)         Values / Beliefs Cultural Requests During Hospitalization: None Spiritual Requests During Hospitalization: None        Additional Information 1:1 In Past 12 Months?: No CIRT Risk: No Elopement Risk: No Does patient have medical clearance?: Yes     Disposition:  Disposition Initial Assessment Completed for this Encounter: Yes Disposition of Patient: Inpatient treatment program  On Site Evaluation by:   Reviewed with Physician:    Edson Snowball 12/27/2013 2:12 PM

## 2013-12-27 NOTE — Discharge Instructions (Signed)
Please read and follow all provided instructions.  Your diagnoses today include:  1. Polysubstance abuse   2. Opioid use with withdrawal    Tests performed today include:  Vital signs. See below for your results today.   Medications prescribed:   None  Home care instructions:  Follow any educational materials contained in this packet.  Follow up with [facility] or the other substance abuse referrals given to you.   Follow-up instructions: Please follow-up with your primary care provider as needed for further evaluation of your symptoms.  Return instructions:   Please return to the Emergency Department if you experience worsening symptoms.   Please return if you have any other emergent concerns.  Additional Information:  Your vital signs today were: BP 127/83   Pulse 80   Temp(Src) 98.6 F (37 C) (Oral)   Resp 20   SpO2 100% If your blood pressure (BP) was elevated above 135/85 this visit, please have this repeated by your doctor within one month. ---------------

## 2013-12-27 NOTE — ED Provider Notes (Signed)
CSN: [id number]     Arrival date & time 12/27/13  1237 History  This chart was scribed for Alecia Lemming PA-C  working with Arbie Cookey, MD by Stacy Gardner, ED scribe. This patient was seen in room WTR4/WLPT4 and the patient's care was started at 12:51 PM.   First MD Initiated Contact with Patient 12/27/13 1250     Chief Complaint  Patient presents with  . Medical Clearance     (Consider location/radiation/quality/duration/timing/severity/associated sxs/prior Treatment) The history is provided by the patient, medical records and a parent. No language interpreter was used.   HPI Comments: Gary Hicks is a 28 y.o. male who presents to the Emergency Department for medical clearance. Pt request detox from heroin. Pt's father mentions that pt tested positive for percocet and marijuana but patient denies other substance abuse. Pt recommended to come to the ED for detox from [facility]. Apparently plan is for patient to go to [facility] after medical clearance. Pt last use was the two [place] ago. He has abdominal cramps, restless leg, nausea, and joint pain. He vomited yesterday. Pt's father state that pt needs to confirmed for Hepatitis C and need to be screened and treated. Pt injected in his hands. No redness, swelling, pain in these areas. Denies any other medical problems. Denies SI and HI. Denies any prior medical conditions.   Pt's father state that he can vouch for the transfer of his son to the treatment center. He states pt has a choice between treatment and going to jail.   History reviewed. No pertinent past medical history. Past Surgical History  Procedure Laterality Date  . Cholecystectomy    . Hand surgery     No family history on file. History  Substance Use Topics  . Smoking status: Current Every [place] Smoker -- 0.50 packs/[place]    Types: Cigarettes  . Smokeless tobacco: Current User  . Alcohol Use: No    Review of Systems  Gastrointestinal: Positive for  nausea, vomiting and abdominal pain.  Musculoskeletal: Positive for arthralgias and myalgias.  Psychiatric/Behavioral: Negative for suicidal ideas.  All other systems reviewed and are negative.     Allergies  Prednisone and Amoxicillin  Home Medications   Prior to Admission medications   Medication Sig Start Date End Date Taking? Authorizing Provider  aspirin-acetaminophen-caffeine (EXCEDRIN MIGRAINE) [phone] MG per tablet Take 2 tablets by mouth every 6 (six) hours as needed for headache or migraine.    Historical Provider, MD  methadone (DOLOPHINE) 5 MG tablet Take 35 mg by mouth daily. [facility]    Historical Provider, MD   BP 125/76  Pulse 93  Temp(Src) 98.6 F (37 C) (Oral)  Resp 48  SpO2 100% Physical Exam  Nursing note and vitals reviewed. Constitutional: He is oriented to person, place, and time. He appears well-developed and well-nourished. No distress.  HENT:  Head: Normocephalic and atraumatic.  Eyes: Conjunctivae and EOM are normal. Pupils are equal, round, and reactive to light. Right eye exhibits no discharge. Left eye exhibits no discharge.  Neck: Normal range of motion. Neck supple. No tracheal deviation present.  Cardiovascular: Normal rate, regular rhythm and normal heart sounds.   Pulmonary/Chest: Effort normal and breath sounds normal. No respiratory distress.  Abdominal: Soft. He exhibits no distension. There is no tenderness.  Musculoskeletal: Normal range of motion.  Neurological: He is alert and oriented to person, place, and time.  Skin: Skin is warm and dry.  No cellulitis or abscess at injection sites.  Psychiatric: He has a normal mood and affect. His behavior is normal.    ED Course  Procedures (including critical care time) DIAGNOSTIC STUDIES: Oxygen Saturation is 100% on room air, normal by my interpretation.    COORDINATION OF CARE:  12:55 PM Discussed course of care with pt which includes laboratory tests. Pt  understands and agrees.    Labs Review Labs Reviewed - No data to display  Imaging Review No results found.   EKG Interpretation None      Vital signs reviewed and are as follows: Filed Vitals:   12/27/13 1541  BP: 127/83  Pulse: 80  Temp:   Resp: 20   Medical screening labs were ordered. TTS called me and offered to see patient and give referrals, send application to [facility].   4:81 PM At this time, still waiting to hear back from [facility] re: bed availability. Family and patient do not want to wait any longer and are requesting discharge. They will follow-up with [facility]. They have also been given other substance abuse referrals. Patient is medically cleared.    MDM   Final diagnoses:  Polysubstance abuse  Opioid use with withdrawal   Withdrawal symptoms mild. Pt medically cleared. No SI/HI.   No dangerous or life-threatening conditions suspected or identified by history, physical exam, and by work-up. No indications for hospitalization identified.      I personally performed the services described in this documentation, which was scribed in my presence. The recorded information has been reviewed and is accurate.    Carlisle Cater, PA-C 12/27/13 [phone]

## 2013-12-27 NOTE — Progress Notes (Signed)
CSW called to follow up with the referral faxed over earlier today and spoke with [facility]. She reports that the intake nurse was in an assessment and she would call back later.  CSW provided her with TTS direct contact information for the return call.    Chesley Noon, MSW, [place], 12/27/2013 Evening Clinical Social Worker [phone]

## 2013-12-27 NOTE — Progress Notes (Addendum)
Pt directed by [place] to f/u with [facility] after medical clearance. CSW spoke with Melissa at [facility] who states beds are avialable however no referral for pt. CSW will fax referral once UDS obtained.   Noreene Larsson [phone]  ED CSW 12/27/2013 1415pm   Pt referral sent to [facility].   Noreene Larsson [phone]  ED CSW 12/27/2013 1527pm

## 2013-12-27 NOTE — ED Notes (Signed)
Per pt, daily heroin user-wants detox-last use was 2 days ago

## 2013-12-27 NOTE — ED Notes (Signed)
SW has not heard from [facility]-waiting to hear back related to info they faxed

## 2013-12-30 NOTE — ED Provider Notes (Signed)
Medical screening examination/treatment/procedure(s) were performed by non-physician practitioner and as supervising physician I was immediately available for consultation/collaboration.   EKG Interpretation None        Houston Siren III, MD 12/30/13 2105

## 2014-01-05 ENCOUNTER — Encounter (HOSPITAL_COMMUNITY): Payer: Self-pay | Admitting: Emergency Medicine

## 2014-01-05 ENCOUNTER — Emergency Department (HOSPITAL_COMMUNITY)
Admission: EM | Admit: 2014-01-05 | Discharge: 2014-01-05 | Discharge status: Against Medical Advice | Payer: PRIVATE HEALTH INSURANCE | Attending: Emergency Medicine | Admitting: Emergency Medicine

## 2014-01-05 ENCOUNTER — Emergency Department (HOSPITAL_COMMUNITY)
Admission: EM | Admit: 2014-01-05 | Discharge: 2014-01-05 | Disposition: A | Discharge status: Home / Self Care | Payer: PRIVATE HEALTH INSURANCE | Attending: Emergency Medicine | Admitting: Emergency Medicine

## 2014-01-05 DIAGNOSIS — Z79899 Other long term (current) drug therapy: Secondary | ICD-10-CM | POA: Insufficient documentation

## 2014-01-05 DIAGNOSIS — F1910 Other psychoactive substance abuse, uncomplicated: Secondary | ICD-10-CM | POA: Diagnosis present

## 2014-01-05 DIAGNOSIS — F17200 Nicotine dependence, unspecified, uncomplicated: Secondary | ICD-10-CM | POA: Insufficient documentation

## 2014-01-05 DIAGNOSIS — F1120 Opioid dependence, uncomplicated: Secondary | ICD-10-CM | POA: Insufficient documentation

## 2014-01-05 DIAGNOSIS — F1410 Cocaine abuse, uncomplicated: Secondary | ICD-10-CM | POA: Insufficient documentation

## 2014-01-05 DIAGNOSIS — R197 Diarrhea, unspecified: Secondary | ICD-10-CM | POA: Insufficient documentation

## 2014-01-05 DIAGNOSIS — Z8719 Personal history of other diseases of the digestive system: Secondary | ICD-10-CM | POA: Insufficient documentation

## 2014-01-05 DIAGNOSIS — IMO0001 Reserved for inherently not codable concepts without codable children: Secondary | ICD-10-CM | POA: Insufficient documentation

## 2014-01-05 DIAGNOSIS — F1994 Other psychoactive substance use, unspecified with psychoactive substance-induced mood disorder: Secondary | ICD-10-CM

## 2014-01-05 DIAGNOSIS — F1110 Opioid abuse, uncomplicated: Secondary | ICD-10-CM | POA: Insufficient documentation

## 2014-01-05 DIAGNOSIS — Z880 Allergy status to penicillin: Secondary | ICD-10-CM | POA: Insufficient documentation

## 2014-01-05 LAB — COMPREHENSIVE METABOLIC PANEL
ALBUMIN: 4 g/dL (ref 3.5–5.2)
ALK PHOS: 84 U/L (ref 39–117)
ALT: 17 U/L (ref 0–53)
AST: 18 U/L (ref 0–37)
BILIRUBIN TOTAL: 0.3 mg/dL (ref 0.3–1.2)
BUN: 14 mg/dL (ref 6–23)
CHLORIDE: 100 meq/L (ref 96–112)
CO2: 27 meq/L (ref 19–32)
Calcium: 9.3 mg/dL (ref 8.4–10.5)
Creatinine, Ser: 0.87 mg/dL (ref 0.50–1.35)
GFR calc Af Amer: >90 mL/min (ref >90)
Glucose, Bld: 91 mg/dL (ref 70–99)
POTASSIUM: 4.3 meq/L (ref 3.7–5.3)
Sodium: 139 mEq/L (ref 137–147)
Total Protein: 7.7 g/dL (ref 6.0–8.3)

## 2014-01-05 LAB — RAPID URINE DRUG SCREEN, HOSP PERFORMED
AMPHETAMINES: NOT DETECTED
Barbiturates: NOT DETECTED
Benzodiazepines: NOT DETECTED
Cocaine: POSITIVE — AB
Opiates: POSITIVE — AB
Tetrahydrocannabinol: NOT DETECTED

## 2014-01-05 LAB — CBC
HEMATOCRIT: 41.7 % (ref 39.0–52.0)
HEMOGLOBIN: 14.4 g/dL (ref 13.0–17.0)
MCH: 32.2 pg (ref 26.0–34.0)
MCHC: 34.5 g/dL (ref 30.0–36.0)
MCV: 93.3 fL (ref 78.0–100.0)
Platelets: 262 10*3/uL (ref 150–400)
RBC: 4.47 MIL/uL (ref 4.22–5.81)
RDW: 13.4 % (ref 11.5–15.5)
WBC: 5.7 10*3/uL (ref 4.0–10.5)

## 2014-01-05 LAB — ETHANOL

## 2014-01-05 MED ORDER — ONDANSETRON 4 MG PO TBDP: 4.0000 mg | ORAL_TABLET | Freq: Four times a day (QID) | ORAL | Status: DC | PRN

## 2014-01-05 MED ORDER — NAPROXEN 500 MG PO TABS: 500.0000 mg | ORAL_TABLET | Freq: Two times a day (BID) | ORAL | Status: DC | PRN

## 2014-01-05 MED ORDER — CLONIDINE HCL 0.1 MG PO TABS: 0.1000 mg | ORAL_TABLET | Freq: Once | ORAL | Status: DC

## 2014-01-05 MED ORDER — DICYCLOMINE HCL 20 MG PO TABS: 20.0000 mg | ORAL_TABLET | Freq: Four times a day (QID) | ORAL | Status: DC | PRN

## 2014-01-05 MED ORDER — CLONIDINE HCL 0.1 MG PO TABS: 0.1000 mg | ORAL_TABLET | Freq: Every day | ORAL | Status: DC

## 2014-01-05 MED ORDER — CLONIDINE HCL 0.1 MG PO TABS: 0.1000 mg | ORAL_TABLET | ORAL | Status: DC

## 2014-01-05 MED ORDER — LOPERAMIDE HCL 2 MG PO CAPS: 2.0000 mg | ORAL_CAPSULE | ORAL | Status: DC | PRN

## 2014-01-05 MED ORDER — CYCLOBENZAPRINE HCL 10 MG PO TABS: 10.0000 mg | ORAL_TABLET | Freq: Once | ORAL | Status: DC

## 2014-01-05 MED ORDER — HYDROXYZINE HCL 25 MG PO TABS: 25.0000 mg | ORAL_TABLET | Freq: Four times a day (QID) | ORAL | Status: DC | PRN

## 2014-01-05 MED ORDER — ALUM & MAG HYDROXIDE-SIMETH 200-200-20 MG/5ML PO SUSP: 30.0000 mL | ORAL | Status: DC | PRN

## 2014-01-05 MED ORDER — METHOCARBAMOL 500 MG PO TABS: 500.0000 mg | ORAL_TABLET | Freq: Three times a day (TID) | ORAL | Status: DC | PRN

## 2014-01-05 MED ORDER — ACETAMINOPHEN 325 MG PO TABS: 650.0000 mg | ORAL_TABLET | ORAL | Status: DC | PRN

## 2014-01-05 MED ORDER — DICYCLOMINE HCL 20 MG PO TABS: 20.0000 mg | ORAL_TABLET | Freq: Two times a day (BID) | ORAL | Status: DC

## 2014-01-05 MED ORDER — DICYCLOMINE HCL 10 MG PO CAPS
10.0000 mg | ORAL_CAPSULE | Freq: Once | ORAL | Status: DC
  Filled 2014-01-05: qty 1

## 2014-01-05 MED ORDER — ZOLPIDEM TARTRATE 5 MG PO TABS: 5.0000 mg | ORAL_TABLET | Freq: Every evening | ORAL | Status: DC | PRN

## 2014-01-05 MED ORDER — ONDANSETRON HCL 4 MG PO TABS: 4.0000 mg | ORAL_TABLET | Freq: Three times a day (TID) | ORAL | Status: DC | PRN

## 2014-01-05 MED ORDER — IBUPROFEN 200 MG PO TABS: 600.0000 mg | ORAL_TABLET | Freq: Three times a day (TID) | ORAL | Status: DC | PRN

## 2014-01-05 MED ORDER — CLONIDINE HCL 0.1 MG PO TABS: 0.1000 mg | ORAL_TABLET | Freq: Four times a day (QID) | ORAL | Status: DC

## 2014-01-05 MED ORDER — NICOTINE 21 MG/24HR TD PT24: 21.0000 mg | MEDICATED_PATCH | Freq: Every day | TRANSDERMAL | Status: DC

## 2014-01-05 MED ORDER — CYCLOBENZAPRINE HCL 10 MG PO TABS: 10.0000 mg | ORAL_TABLET | Freq: Two times a day (BID) | ORAL | Status: DC | PRN

## 2014-01-05 NOTE — ED Notes (Signed)
Pt was not placed into the consultation room until 1540. Pt had apparently stepped outside.

## 2014-01-05 NOTE — ED Notes (Signed)
Pt walked out of the front door of the ER while this RN was in another room. PA Pisciotta made aware. Pt was voluntary and denied HI/SI.

## 2014-01-05 NOTE — ED Notes (Signed)
Patient is here for medical clearance to go to [facility]

## 2014-01-05 NOTE — ED Provider Notes (Signed)
**Note Gary-Identified via Obfuscation** CSN: [id number]     Arrival date & time 01/05/14  1118 History  This chart was scribed for Gary Blitz, PA-C, working with Gary Kelch, MD by Gary Hicks, ED Scribe. This patient was seen in room [facility] and the patient's care was started at 12:08 PM.    Chief Complaint  Patient presents with  . Medical Clearance      The history is provided by the patient. No language interpreter was used.    HPI Comments: Gary Hicks is a 28 y.o. male who presents to the Emergency Department for medical clearance. He is requesting detox from heroin. He last injected heroin the night before last. He reports myalgias and diarrhea as withdrawal symptoms. He denies vomiting. He states that [facility] has a bed according to a phone call his dad made this morning but he was told to first come here for a medical clearance.   Past Medical History  Diagnosis Date  . Heroin abuse   . Liver disease     possible Hep. C no treatment or confirmation    Past Surgical History  Procedure Laterality Date  . Cholecystectomy    . Hand surgery     History reviewed. No pertinent family history. History  Substance Use Topics  . Smoking status: Current Every Gary Hicks Smoker -- 0.50 packs/Gary Hicks    Types: Cigarettes  . Smokeless tobacco: Current User  . Alcohol Use: No    Review of Systems A complete 10 system review of systems was obtained and all systems are negative except as noted in the HPI and PMH.     Allergies  Prednisone and Amoxicillin  Home Medications   Prior to Admission medications   Medication Sig Start Date End Date Taking? Authorizing Provider  cyclobenzaprine (FLEXERIL) 10 MG tablet Take 1 tablet (10 mg total) by mouth 2 (two) times daily as needed for muscle spasms. 01/05/14   Gary Kosloski, PA-C  dicyclomine (BENTYL) 20 MG tablet Take 1 tablet (20 mg total) by mouth 2 (two) times daily. 01/05/14   Gary Pluta, PA-C   Triage Vitals: BP 122/64  Pulse 96  Temp(Src) 98.5 F (36.9  C) (Oral)  Resp 16  SpO2 100%  Physical Exam  Nursing note and vitals reviewed. Constitutional: He is oriented to person, place, and time. He appears well-developed and well-nourished. No distress.  HENT:  Head: Normocephalic and atraumatic.  Mouth/Throat: Oropharynx is clear and moist.  Eyes: Conjunctivae and EOM are normal. Pupils are equal, round, and reactive to light.  Neck: Normal range of motion. No tracheal deviation present.  Cardiovascular: Normal rate, regular rhythm, normal heart sounds and intact distal pulses.   No murmur heard. Pulmonary/Chest: Effort normal and breath sounds normal. No respiratory distress.  Abdominal: Soft. Bowel sounds are normal. He exhibits no distension and no mass. There is no tenderness. There is no rebound and no guarding.  Musculoskeletal: Normal range of motion.  Neurological: He is alert and oriented to person, place, and time. No cranial nerve deficit. Coordination normal.  Skin: Skin is warm and dry.  Psychiatric: He has a normal mood and affect. His behavior is normal. Judgment and thought content normal.    ED Course  Procedures (including critical care time)  DIAGNOSTIC STUDIES: Oxygen Saturation is 100% on room air, normal by my interpretation.    COORDINATION OF CARE: 12:55 PM -Ordered labs. Patient verbalizes understanding and agrees with treatment plan.  1:30 PM -Spoke with [facility], who stated that they will have a  bed available for patient but it will take a [facility] or two to process, during which time patient will need to stay in the psych ward in the [facility] ED. Discussed this plan with the patient, who refuses to stay in the pysch ward. He originally stated his father was the reason for him coming in for detox but also stated that he needs to do the detox. He states that he would like to be placed at [facility], and his reasoning for jumping out of a car a few days ago prior to attempted placement in [facility] was to get away from his parents, not to  refuse treatment. He is adamant about not staying in the psych ward overnight, even if necessary for placement in substance abuse treatment [facility]. Will call [facility] to determine steps necessary to get patient placed. Patient verbalizes understanding and agrees with treatment plan.  8:31 PM -According to personnel at [facility] will not take patient most likely since he did not show up for his bed a few days ago. Will discharge patient. Patient verbalizes understanding and agrees with treatment plan.    Labs Review Labs Reviewed - No data to display  Imaging Review No results found.   EKG Interpretation None      MDM   Final diagnoses:  Intravenous drug abuse  Opiate addiction    Filed Vitals:   01/05/14 1142  BP: 122/64  Pulse: 96  Temp: 98.5 F (36.9 C)  TempSrc: Oral  Resp: 16  SpO2: 100%    Gary Hicks is a 28 y.o. male presenting with request for IV opiate detox. Patient had arrange detox at [facility], patient jumped out of a car last week to avoid going to rehabilitation. Patient's father advised him he would have to come in for rehabilitation or he would call the police because the patient pawned a lawnmower. Patient is equivocal on whether or not he wants rehabilitation or not. Discussed options with Gary Hicks from [facility]  goodbyes the patient will need to be medically cleared, have psychiatric evaluation and be transferred to the psych ED for placement when bed becomes available. Discussed this with patient who is not agreeable to psychiatric ED transfer. Requesting to leave. Patient is not suicidal, homicidal, actively psychotic and a danger to himself, he did not appear intoxicated and has the right of refusal. Patient discharged with outpatient instructions for detox.   Evaluation does not show pathology that would require ongoing emergent intervention or inpatient treatment. Pt is hemodynamically stable and mentating appropriately. Discussed findings and plan with patient/guardian,  who agrees with care plan. All questions answered. Return precautions discussed and outpatient follow up given.   Discharge Medication List as of 01/05/2014  1:35 PM    START taking these medications   Details  cyclobenzaprine (FLEXERIL) 10 MG tablet Take 1 tablet (10 mg total) by mouth 2 (two) times daily as needed for muscle spasms., Starting 01/05/2014, Until Discontinued, Print    dicyclomine (BENTYL) 20 MG tablet Take 1 tablet (20 mg total) by mouth 2 (two) times daily., Starting 01/05/2014, Until Discontinued, Print         I personally performed the services described in this documentation, which was scribed in my presence. The recorded information has been reviewed and is accurate.    Gary Blitz, PA-C 01/05/14 2032

## 2014-01-05 NOTE — ED Notes (Signed)
I tried to stick patient two times was no able to get anything.  Made the nurse aware and called the lab

## 2014-01-05 NOTE — Discharge Instructions (Signed)
Do not hesitate to return to the Emergency Department for any new, worsening or concerning symptoms.   If you do not have a primary care doctor you can establish one at the   [facility]: [place] [id number] [phone]  After you establish care. Let them know you were seen in the emergency room. They must obtain records for further management.   [place]  Intensive Outpatient Programs [facility]  [address], [place]  [place] and evening program available [facility]  [address] [place], [place] [facility]acarias Pontes [facility]  [address]  [place], [place].: Substance [place]  [place], [place]  [phone] ADS: Alcohol and Drug Services  [place], [place] [facility]- Intensive Outpatient  [address], [address] [place], [place]   Residential Treatment Programs ASAP Residential Treatment  [address], [facility] ([place])  [place], [place] or [place]  [address], [address]  [place], [place] [facility] [place] (several in [place])  [place], [place]  [address] [place], [place]  [place] 8am-3pm Mon-Fri [facility] ([facility])  [place], [place]  Self-Help / [place] of [place] of support groups  [phone] (call for info) Opiate Dependence The above names are all different names used for opiates. Opiates are any medication made from the poppy plant. It is a medication  which produces a calming, sleepy effect. Because achieving this effect requires more and more of this drug to get the same result, opiates become addictive. A family history of addiction or an addictive personality increases the risk. When drug use is interfering with normal living activities, it has become abuse. This includes problems with family, friends, and your job. Psychological dependence has developed when your mind tells you that the drug is needed. This emotional dependence is the craving for the "high" that some drugs cause. Emotional addicts always want this high instead of the way they are feeling when not using the drug. This is difficult to overcome. This is usually followed by physical dependence that has developed when continuing increases of drug are required to get the same feeling or "high". This is known as addiction or chemical dependency.  SIGNS OF CHEMICAL DEPENDENCY  Friends and family tell you there is a problem.  Fighting when using drugs.  Mood swings and insomnia.  Forgetfulness.  Not remembering what you do while using (blackouts).  Feeling sick from using drugs but you continue using.  Lie about use or amounts of drugs (chemicals) used.  Need chemicals to get you going.  Suffer in work Systems analyst or school because of drug use.  Need drugs to relate to people or feel comfortable in social situations.  Use drugs to forget problems.  Difficulty with attention.  Neglecting obligations. If you answered "yes" to any or some of the above signs of chemical dependency, you may have a problem. The longer the use of drugs continues, the greater the problems will become. Do not experiment with drugs.  SIGNS AND SYMPTOMS OF PHYSICAL DEPENDENCE  Sudden stopping of the narcotic is uncomfortable when tolerance has developed. Physical problems will develop. This is called withdrawal.  How bad the withdrawal is varies from person to person. Some of the smaller problems  are:  Tremors in the hands or shakes and jitters.  A fast heart rate and rapid breathing.  An increase in temperature.  Anxiety and panic attacks with bad dreams.  Muscle aches and pains. You may have more serious problems. These can include:  Feeling sick to your stomach or throwing up.  Dehydration develops if you cannot keep fluids down.  Tremors and chills or fever with sweating and anxiety.  Hallucinations and cravings.  Body aching with restlessness and insomnia.  Seizures or convulsions. These problems can last for months. These uncomfortable feeling can cause you to use drugs again just to feel better. OTHER HEALTH RISKS OF NARCOTICS USE INCLUDE:  The increased possibility of getting AIDS, hepatitis, other infectious or sexually transmitted diseases.  Unplanned pregnancy and having a baby born addicted to narcotics. You then put your baby through painful withdrawal symptoms including: shaking, jerking, and crying in pain. Many babies die. Other babies have lifelong disabilities and learning problems. TREATMENT  Effective treatment and management of narcotic addiction requires a multi-faceted, team approach that includes:  Medications to minimize the symptoms of narcotic withdrawal.  Medications to reduce the need for continued narcotic use.  Medical management of unrelated medical problems.  Pain management.  Social services.  Psychological treatment.  Behavioral therapies. Stopping your dependence is hard but may save your life. If you continue using drugs, the only possible outcome is loss of self respect and esteem, violence, and eventually prison or death. To stop abuse, you must first realize you have a problem. You control your behavior. Once you realize this, commit to quitting. Addiction is a disease. You need medical help to get well. Your caregiver can counsel you or refer you for counseling. The best way to do this is to seek out an organization for  help. These include Alcoholics Anonymous, Narcotics Anonymous, or the [employer] on Alcoholism and Drug Dependence. HOW TO STAY CLEAN WHEN YOU HAVE QUIT USING  Develop healthy activities.  Form friendships with those who do not use drugs.  Stay away from all drugs. Alcohol will lessen your ability to say no.  Have ready excuses available about why you cannot use. If that is difficult, stay away from people who knew you used. HOME CARE INSTRUCTIONS   Both prescription medicines and over-the-counter medicines are used as part of the treatment. It is critical to follow the recommendations of your caregiver at home. Any additional over-the-counter medications or changes in the recommended treatment plan should be discussed with your caregiver first.  It is important to keep fluids down. Juices, soda, Gatorade, or a mixture will help prevent dehydration.  Be prepared for the emotional swings of quitting.  Call your local emergency services if seizures (convulsions) occur or if you are unable to keep liquids down.  Keep a written record of medications you take and times given. Overcoming addictions takes years. Over time you will have a lessening of the craving for narcotics. Talk to your caregiver or a member of your support group if you need more help.  Addiction cannot be cured but it can be stopped. Treatment centers are listed in the yellow pages under: Cocaine, Narcotics, and Alcoholics Anonymous. Most hospitals and clinics can refer you to a specialized  care center. Document Released: 05/16/2007 Document Revised: 10/11/2011 Document Reviewed: 05/16/2007 [facility] Patient Information 2014 [place].  Narcotic Anonymous (NA)  [facility]  [place], [place] (2 meetings at this location)

## 2014-01-05 NOTE — Progress Notes (Signed)
CSW faxed admissions packet to [facility].   Rochele Pages,     ED CSW  phone: [phone] 5:00pm

## 2014-01-05 NOTE — ED Notes (Signed)
Pt presents with c/o detox from heroine. Pt was seen earlier today for same and discharged but pt's father reports that pt has to come back for treatment or he will call the police and have him arrested. Pt says that he does not want to be here and he is only here for his dad. Pt says that his last heroine use was Thursday night. Denies SI/HI.

## 2014-01-05 NOTE — ED Provider Notes (Signed)
CSN: [id number]     Arrival date & time 01/05/14  1505 History   First MD Initiated Contact with Patient 01/05/14 1537     Chief Complaint  Patient presents with  . Medical Clearance     (Consider location/radiation/quality/duration/timing/severity/associated sxs/prior Treatment) HPI  Nathin Saran Pellum is a 28 y.o. male presenting for IV heroin detox. Patient was seen several hours ago, requested discharge because he did not want to wait any ED or be transferred to the psych ED. Patient's father is insistent that he did treatment however patient states that he is only here because his father is forcing him to be here or be arrested. No complaints, denies recent drug use.  Past Medical History  Diagnosis Date  . Heroin abuse   . Liver disease     possible Hep. C no treatment or confirmation    Past Surgical History  Procedure Laterality Date  . Cholecystectomy    . Hand surgery     No family history on file. History  Substance Use Topics  . Smoking status: Current Every Coby Smoker -- 0.50 packs/[place]    Types: Cigarettes  . Smokeless tobacco: Current User  . Alcohol Use: No    Review of Systems  10 systems reviewed and found to be negative, except as noted in the HPI.   Allergies  Prednisone and Amoxicillin  Home Medications   Prior to Admission medications   Medication Sig Start Date End Date Taking? Authorizing Provider  cyclobenzaprine (FLEXERIL) 10 MG tablet Take 1 tablet (10 mg total) by mouth 2 (two) times daily as needed for muscle spasms. 01/05/14   Charlton Boule, PA-C  dicyclomine (BENTYL) 20 MG tablet Take 1 tablet (20 mg total) by mouth 2 (two) times daily. 01/05/14   Raylan Troiani, PA-C   BP 110/65  Pulse 89  Temp(Src) 98 F (36.7 C) (Oral)  Resp 16  SpO2 100% Physical Exam  Nursing note and vitals reviewed. Constitutional: He is oriented to person, place, and time. He appears well-developed and well-nourished. No distress.  HENT:  Head: Normocephalic  and atraumatic.  Mouth/Throat: Oropharynx is clear and moist.  Eyes: Conjunctivae and EOM are normal. Pupils are equal, round, and reactive to light.  Neck: Normal range of motion.  Cardiovascular: Normal rate, regular rhythm and intact distal pulses.   No murmur heard. Pulmonary/Chest: Effort normal and breath sounds normal. No stridor. No respiratory distress. He has no wheezes. He has no rales. He exhibits no tenderness.  Abdominal: Soft. Bowel sounds are normal. He exhibits no distension and no mass. There is no tenderness. There is no rebound and no guarding.  Musculoskeletal: Normal range of motion. He exhibits no edema.  Neurological: He is alert and oriented to person, place, and time.  Skin: Rash noted.  Scar tissue to bilateral a.c. areas, no abscess, warmth or  signs of active infection.  Psychiatric: He has a normal mood and affect.    ED Course  Procedures (including critical care time) Labs Review Labs Reviewed  URINE RAPID DRUG SCREEN (HOSP PERFORMED) - Abnormal; Notable for the following:    Opiates POSITIVE (*)    Cocaine POSITIVE (*)    All other components within normal limits  CBC  COMPREHENSIVE METABOLIC PANEL  ETHANOL    Imaging Review No results found.   EKG Interpretation None      MDM   Final diagnoses:  Heroin abuse  Polysubstance abuse    Filed Vitals:   01/05/14 1619  BP: 110/65  Pulse: 89  Temp: 98 F (36.7 C)  TempSrc: Oral  Resp: 16  SpO2: 100%    Medications  dicyclomine (BENTYL) tablet 20 mg (not administered)  hydrOXYzine (ATARAX/VISTARIL) tablet 25 mg (not administered)  loperamide (IMODIUM) capsule 2-4 mg (not administered)  methocarbamol (ROBAXIN) tablet 500 mg (not administered)  naproxen (NAPROSYN) tablet 500 mg (not administered)  ondansetron (ZOFRAN-ODT) disintegrating tablet 4 mg (not administered)  cloNIDine (CATAPRES) tablet 0.1 mg (not administered)    Followed by  cloNIDine (CATAPRES) tablet 0.1 mg (not  administered)    Followed by  cloNIDine (CATAPRES) tablet 0.1 mg (not administered)  alum & mag hydroxide-simeth (MAALOX/MYLANTA) 200-200-20 MG/5ML suspension 30 mL (not administered)  ondansetron (ZOFRAN) tablet 4 mg (not administered)  nicotine (NICODERM CQ - dosed in mg/24 hours) patch 21 mg (not administered)  zolpidem (AMBIEN) tablet 5 mg (not administered)  ibuprofen (ADVIL,MOTRIN) tablet 600 mg (not administered)  acetaminophen (TYLENOL) tablet 650 mg (not administered)    Joshia Kitchings Eynon is a 28 y.o. male again for detox from IV heroin. Patient was here several hours ago and requested discharge. Patient's father has convinced him to return to the ED under threat of imprisonment. Patient does not appear to be committed to detox at this time. TTS consult from [facility] appreciated: She is calm to evaluate the patient, she is advised him that he will need to be transferred to the psych ED if he wants inpatient treatment. Patient has committed to stay for treatment.  Patient eloped from the ED.     Monico Blitz, PA-C 01/05/14 2037

## 2014-01-05 NOTE — Consult Note (Signed)
[facility] Face-to-Face Psychiatry Consult   Reason for Consult:  Heroin abuse Referring Physician:  EDP  Gary Hicks is an 28 y.o. male. Total Time spent with patient: 30 minutes  Assessment: AXIS I:  Substance Abuse and Substance Induced Mood Disorder AXIS II:  Deferred AXIS III:   Past Medical History  Diagnosis Date  . Heroin abuse   . Liver disease     possible Hep. C no treatment or confirmation    AXIS IV:  other psychosocial or environmental problems AXIS V:  41-50 serious symptoms  Plan:  Refer to inpatient facility for heroin detox.   Subjective:   Gary Hicks is a 28 y.o. male patient .  HPI:  Patient states that he would like help with heroin detox.  Patient has presented to the ED several time with his father requesting assistance with detox.  Patient states that he uses heroin daily "I shoot up".  Patient denies homicidal/suicidal ideation, psychosis, and paranoia.  Patient states that he has never detox or have a psych history.  Patient lives with his parents.    HPI Elements:   Location:  Heroin abuse. Quality:  Heroin withdrawal. Severity:  Heroin withdrawal. Timing:  1 Fernando. Review of Systems  Constitutional: Negative for malaise/fatigue and diaphoresis.  Gastrointestinal: Negative for nausea, vomiting, abdominal pain, diarrhea and constipation.  Musculoskeletal: Negative.   Neurological: Positive for tremors. Negative for weakness and headaches.  Psychiatric/Behavioral: Positive for substance abuse. Negative for depression, suicidal ideas, hallucinations and memory loss. The patient is nervous/anxious. The patient does not have insomnia.     Denies family history of mental illness or substance abuse Past Psychiatric History: Past Medical History  Diagnosis Date  . Heroin abuse   . Liver disease     possible Hep. C no treatment or confirmation     reports that he has been smoking Cigarettes.  He has been smoking about 0.50 packs per [place]. He uses smokeless  tobacco. He reports that he uses illicit drugs (IV). He reports that he does not drink alcohol. No family history on file.         Allergies:   Allergies  Allergen Reactions  . Prednisone Hives  . Amoxicillin Hives    ACT Assessment Complete:  No:   Past Psychiatric History: Diagnosis:  Substance Abuse and Substance Induced Mood Disorder, Heroin withdrawal  Hospitalizations:  Denies  Outpatient Care:  Denies  Substance Abuse Care:  Heroin, Cocaine, THC  Self-Mutilation:  Denies  Suicidal Attempts:  Denies  Homicidal Behaviors:  Denies   Violent Behaviors:  Denies   Place of Residence:  [place] Marital Status:  Single Employed/Unemployed:  Unemployed Education:   Family Supports:  Yes Objective: There were no vitals taken for this visit.There is no weight on file to calculate BMI.No results found for this or any previous visit (from the past 72 hour(s)). Labs are in the process of being collected at this time..  Medications reviewed and no changes made.    No current facility-administered medications for this encounter.   Current Outpatient Prescriptions  Medication Sig Dispense Refill  . cyclobenzaprine (FLEXERIL) 10 MG tablet Take 1 tablet (10 mg total) by mouth 2 (two) times daily as needed for muscle spasms.  20 tablet  0  . dicyclomine (BENTYL) 20 MG tablet Take 1 tablet (20 mg total) by mouth 2 (two) times daily.  20 tablet  0    Psychiatric Specialty Exam:     There were no vitals taken  for this visit.There is no weight on file to calculate BMI.  General Appearance: Casual and Disheveled  Eye Contact::  Good  Speech:  Clear and Coherent and Normal Rate  Volume:  Normal  Mood:  Anxious  Affect:  Congruent  Thought Process:  Circumstantial  Orientation:  Full (Time, Place, and Person)  Thought Content:  WDL  Suicidal Thoughts:  No  Homicidal Thoughts:  No  Memory:  Immediate;   Good Recent;   Good Remote;   Good  Judgement:  Poor  Insight:  Lacking   Psychomotor Activity:  Restlessness and Tremor  Concentration:  Fair  Recall:  Good  Fund of Knowledge:Good  Language: Good  Akathisia:  No  Handed:  Right  AIMS (if indicated):     Assets:  Communication Skills Desire for Improvement Housing Social Support  Sleep:      Musculoskeletal: Strength & Muscle Tone: within normal limits Gait & Station: normal Patient leans: N/A  Treatment Plan Summary: Will refer patient to facilities that do Heroin detox.  If no beds available; patient will be discharge with resources to continue seeking placement and will also have resources for outpatient treatment for heroin.      Shuvon Rankin, FNP-BC 01/05/2014 3:51 PM  Case discussed with physician extender and reviewed the information documented and agree with the treatment plan.  Parke Simmers Ellyn Rubiano 01/05/2014 4:15 PM

## 2014-01-06 NOTE — ED Provider Notes (Signed)
Medical screening examination/treatment/procedure(s) were performed by non-physician practitioner and as supervising physician I was immediately available for consultation/collaboration.   EKG Interpretation None        Blanchard Kelch, MD 01/06/14 [phone]

## 2014-01-06 NOTE — ED Provider Notes (Signed)
Medical screening examination/treatment/procedure(s) were performed by non-physician practitioner and as supervising physician I was immediately available for consultation/collaboration.   EKG Interpretation None        Blanchard Kelch, MD 01/06/14 [phone]

## 2014-05-29 ENCOUNTER — Encounter (HOSPITAL_COMMUNITY): Payer: Self-pay | Admitting: Emergency Medicine

## 2014-05-29 ENCOUNTER — Emergency Department (HOSPITAL_COMMUNITY)
Admission: EM | Admit: 2014-05-29 | Discharge: 2014-05-29 | Disposition: A | Discharge status: Home / Self Care | Payer: PRIVATE HEALTH INSURANCE | Attending: Emergency Medicine | Admitting: Emergency Medicine

## 2014-05-29 ENCOUNTER — Emergency Department (HOSPITAL_COMMUNITY): Payer: PRIVATE HEALTH INSURANCE

## 2014-05-29 DIAGNOSIS — L03115 Cellulitis of right lower limb: Secondary | ICD-10-CM | POA: Insufficient documentation

## 2014-05-29 DIAGNOSIS — R609 Edema, unspecified: Secondary | ICD-10-CM

## 2014-05-29 DIAGNOSIS — Z8719 Personal history of other diseases of the digestive system: Secondary | ICD-10-CM | POA: Insufficient documentation

## 2014-05-29 DIAGNOSIS — R52 Pain, unspecified: Secondary | ICD-10-CM

## 2014-05-29 DIAGNOSIS — L03818 Cellulitis of other sites: Secondary | ICD-10-CM

## 2014-05-29 DIAGNOSIS — Z720 Tobacco use: Secondary | ICD-10-CM | POA: Insufficient documentation

## 2014-05-29 DIAGNOSIS — Z880 Allergy status to penicillin: Secondary | ICD-10-CM | POA: Insufficient documentation

## 2014-05-29 MED ORDER — SULFAMETHOXAZOLE-TRIMETHOPRIM 800-160 MG PO TABS: 1.0000 | ORAL_TABLET | Freq: Two times a day (BID) | ORAL | Status: DC

## 2014-05-29 MED ORDER — HYDROCODONE-ACETAMINOPHEN 5-325 MG PO TABS
1.0000 | ORAL_TABLET | Freq: Once | ORAL | Status: AC
Start: 2014-05-29 — End: 2014-05-29
  Administered 2014-05-29: 1 via ORAL
  Filled 2014-05-29: qty 1

## 2014-05-29 MED ORDER — SULFAMETHOXAZOLE-TMP DS 800-160 MG PO TABS
1.0000 | ORAL_TABLET | Freq: Once | ORAL | Status: AC
  Administered 2014-05-29: 1 via ORAL
  Filled 2014-05-29: qty 1

## 2014-05-29 MED ORDER — HYDROCODONE-ACETAMINOPHEN 5-325 MG PO TABS: 1.0000 | ORAL_TABLET | Freq: Four times a day (QID) | ORAL | Status: DC | PRN

## 2014-05-29 MED ORDER — IBUPROFEN 400 MG PO TABS
600.0000 mg | ORAL_TABLET | Freq: Once | ORAL | Status: AC
  Administered 2014-05-29: 600 mg via ORAL
  Filled 2014-05-29 (×2): qty 1

## 2014-05-29 NOTE — ED Provider Notes (Signed)
CSN: [id number]     Arrival date & time 05/29/14  1925 History   First MD Initiated Contact with Patient 05/29/14 2022     Chief Complaint  Patient presents with  . Foot Pain     (Consider location/radiation/quality/duration/timing/severity/associated sxs/prior Treatment) HPI Comments: Noticed a red area dorsal aspect R foot over web space for the past 3 days  Walks bear foot outside, denies injury, fever Denies trauma, IV drug use   Patient is a 28 y.o. male presenting with lower extremity pain. The history is provided by the patient.  Foot Pain This is a new problem. The current episode started in the past 7 days. The problem occurs constantly. The problem has been gradually worsening. Pertinent negatives include no fever, joint swelling, numbness or weakness. The symptoms are aggravated by walking. He has tried NSAIDs for the symptoms. The treatment provided no relief.    Past Medical History  Diagnosis Date  . Heroin abuse   . Liver disease     possible Hep. C no treatment or confirmation    Past Surgical History  Procedure Laterality Date  . Cholecystectomy    . Hand surgery     No family history on file. History  Substance Use Topics  . Smoking status: Current Every Grismer Smoker -- 0.50 packs/[place]    Types: Cigarettes  . Smokeless tobacco: Current User  . Alcohol Use: No    Review of Systems  Constitutional: Negative for fever.  Musculoskeletal: Negative for joint swelling.  Skin: Positive for wound.  Neurological: Negative for weakness and numbness.  All other systems reviewed and are negative.     Allergies  Prednisone and Amoxicillin  Home Medications   Prior to Admission medications   Not on File   BP 98/68  Pulse 96  Temp(Src) 98.1 F (36.7 C) (Oral)  Resp 16  Ht 5\' 9"  (1.753 m)  Wt 141 lb (63.957 kg)  BMI 20.81 kg/m2  SpO2 99% Physical Exam  Nursing note and vitals reviewed. Constitutional: He appears well-developed and well-nourished.    HENT:  Head: Normocephalic.  Eyes: Pupils are equal, round, and reactive to light.  Neck: Normal range of motion.  Cardiovascular: Normal rate and regular rhythm.   Pulmonary/Chest: Effort normal.  Musculoskeletal: Normal range of motion.       Feet:  Neurological: He is alert.  Skin: There is erythema.    ED Course  Procedures (including critical care time) Labs Review Labs Reviewed - No data to display  Imaging Review Dg Foot Complete Right  05/29/2014   CLINICAL DATA:  Pain and swelling at the base of the first toe.  EXAM: RIGHT FOOT COMPLETE - 3+ VIEW  COMPARISON:  None.  FINDINGS: There is no evidence of fracture or dislocation. There is no evidence of arthropathy or other focal bone abnormality. Soft tissues are unremarkable.  IMPRESSION: Negative.   Electronically Signed   By: Kerby Moors M.D.   On: 05/29/2014 20:36     EKG Interpretation None    patient is a recovering narcotic abuser sober for 2 years, significant other will monitor narcotic Xray negative for fracture or FB will start antibitic and pain control patient to retur if worse, develops fever, increased swelling  MDM   Final diagnoses:  Pain  Swelling         Garald Balding, NP 05/29/14 2101

## 2014-05-29 NOTE — ED Notes (Signed)
Pt c/o pain to right foot at big toe.  Red area noted.  St's he does not remember injurying his toel

## 2014-05-29 NOTE — Discharge Instructions (Signed)
Return for worsening pain , fever, generalized body aches, increased swelling  Take all the antibiotic Use over the counter Ibuprofen in conjunction with the Vicodin Apply warm compresses 3-4 times daily for 10-15 minutes

## 2014-05-29 NOTE — ED Notes (Signed)
Pt. reports right foot pain with swelling onset 3 days ago , denies injury or fall, ambulatory , no fever or chills.

## 2014-05-30 NOTE — ED Provider Notes (Signed)
Medical screening examination/treatment/procedure(s) were performed by non-physician practitioner and as supervising physician I was immediately available for consultation/collaboration.   EKG Interpretation None        Yancy Hascall, MD 05/30/14 0019 

## 2015-01-05 ENCOUNTER — Emergency Department (HOSPITAL_COMMUNITY)
Admission: EM | Admit: 2015-01-05 | Discharge: 2015-01-05 | Disposition: A | Discharge status: Home / Self Care | Payer: Self-pay | Attending: Emergency Medicine | Admitting: Emergency Medicine

## 2015-01-05 ENCOUNTER — Encounter (HOSPITAL_COMMUNITY): Payer: Self-pay | Admitting: *Deleted

## 2015-01-05 ENCOUNTER — Emergency Department (HOSPITAL_COMMUNITY): Payer: PRIVATE HEALTH INSURANCE

## 2015-01-05 DIAGNOSIS — Z880 Allergy status to penicillin: Secondary | ICD-10-CM | POA: Insufficient documentation

## 2015-01-05 DIAGNOSIS — J069 Acute upper respiratory infection, unspecified: Secondary | ICD-10-CM | POA: Insufficient documentation

## 2015-01-05 DIAGNOSIS — Z87828 Personal history of other (healed) physical injury and trauma: Secondary | ICD-10-CM | POA: Insufficient documentation

## 2015-01-05 DIAGNOSIS — M25561 Pain in right knee: Secondary | ICD-10-CM | POA: Insufficient documentation

## 2015-01-05 DIAGNOSIS — R059 Cough, unspecified: Secondary | ICD-10-CM

## 2015-01-05 DIAGNOSIS — R05 Cough: Secondary | ICD-10-CM

## 2015-01-05 DIAGNOSIS — Z720 Tobacco use: Secondary | ICD-10-CM | POA: Insufficient documentation

## 2015-01-05 DIAGNOSIS — Z792 Long term (current) use of antibiotics: Secondary | ICD-10-CM | POA: Insufficient documentation

## 2015-01-05 DIAGNOSIS — Z8719 Personal history of other diseases of the digestive system: Secondary | ICD-10-CM | POA: Insufficient documentation

## 2015-01-05 MED ORDER — BENZONATATE 100 MG PO CAPS: 100.0000 mg | ORAL_CAPSULE | Freq: Three times a day (TID) | ORAL | Status: DC

## 2015-01-05 MED ORDER — IBUPROFEN 800 MG PO TABS: 800.0000 mg | ORAL_TABLET | Freq: Three times a day (TID) | ORAL | Status: DC

## 2015-01-05 MED ORDER — ALBUTEROL SULFATE HFA 108 (90 BASE) MCG/ACT IN AERS
1.0000 | INHALATION_SPRAY | Freq: Four times a day (QID) | RESPIRATORY_TRACT | Status: DC | PRN
Start: 2015-01-05 — End: 2015-01-05
  Administered 2015-01-05: 2 via RESPIRATORY_TRACT
  Filled 2015-01-05: qty 6.7

## 2015-01-05 NOTE — ED Provider Notes (Signed)
CSN: [id number]     Arrival date & time 01/05/15  1606 History  This chart was scribed for non-physician practitioner, Dahlia Bailiff, PA-C,working with Daleen Bo, MD, by Marlowe Kays, ED Scribe. This patient was seen in room TR10C/TR10C and the patient's care was started at 5:35 PM.  Chief Complaint  Patient presents with  . Knee Pain  . URI   Patient is a 29 y.o. male presenting with knee pain and URI. The history is provided by the patient and medical records. No language interpreter was used.  Knee Pain Associated symptoms: no fever   URI Presenting symptoms: congestion, cough and rhinorrhea   Presenting symptoms: no fever   Associated symptoms: arthralgias     HPI Comments:  Gary Hicks is a 29 y.o. male who presents to the Emergency Department complaining of severe right knee pain that began approximately one week ago. He states he initially injured the knee about three year ago and has since returned. He reports being seen by an orthopedist but worker's compensation stopped covering it and the pain resolved so he stopped going. He states walking or touching the knee makes the pain worse. He denies any alleviating factors. He states he has been icing the area and wearing a brace which only makes the pain worse. He reports taking Ibuprofen with no significant relief of the pain. Denies numbness, tingling or weakness of the area, redness, warmth, bruising or wounds. He denies any new trauma, injury or fall. He also reports productive cough of yellow mucous and sore throat that began about three to four days ago. He reports associated rhinorrhea, nasal and chest congestion. He has not done anything to treat the symptoms and denies any modifying factors. Denies fever, chills, nausea or vomiting. Pt endorses daily smoking.  Past Medical History  Diagnosis Date  . Heroin abuse   . Liver disease     possible Hep. C no treatment or confirmation    Past Surgical History  Procedure Laterality  Date  . Cholecystectomy    . Hand surgery     No family history on file. History  Substance Use Topics  . Smoking status: Current Every Hagner Smoker -- 0.50 packs/[place]    Types: Cigarettes  . Smokeless tobacco: Current User  . Alcohol Use: No    Review of Systems  Constitutional: Negative for fever and chills.  HENT: Positive for congestion and rhinorrhea.   Respiratory: Positive for cough.   Gastrointestinal: Negative for nausea and vomiting.  Musculoskeletal: Positive for arthralgias. Negative for joint swelling.  Skin: Negative for color change and wound.  Neurological: Negative for weakness and numbness.    Allergies  Prednisone and Amoxicillin  Home Medications   Prior to Admission medications   Medication Sig Start Date End Date Taking? Authorizing Provider  benzonatate (TESSALON) 100 MG capsule Take 1 capsule (100 mg total) by mouth every 8 (eight) hours. 01/05/15   Dahlia Bailiff, PA-C  HYDROcodone-acetaminophen (NORCO/VICODIN) 5-325 MG per tablet Take 1 tablet by mouth every 6 (six) hours as needed for moderate pain. 05/29/14   Junius Creamer, NP  ibuprofen (ADVIL,MOTRIN) 800 MG tablet Take 1 tablet (800 mg total) by mouth 3 (three) times daily. 01/05/15   Dahlia Bailiff, PA-C  sulfamethoxazole-trimethoprim (SEPTRA DS) 800-160 MG per tablet Take 1 tablet by mouth 2 (two) times daily. 05/29/14   Junius Creamer, NP   Triage Vitals: BP 104/63 mmHg  Pulse 94  Temp(Src) 99.4 F (37.4 C) (Oral)  Resp 18  Ht  5\' 9"  (1.753 m)  Wt 140 lb (63.504 kg)  BMI 20.67 kg/m2  SpO2 99% Physical Exam  Constitutional: He is oriented to person, place, and time. He appears well-developed and well-nourished.  HENT:  Head: Normocephalic and atraumatic.  Right Ear: Tympanic membrane normal.  Left Ear: Tympanic membrane normal.  Mouth/Throat: Uvula is midline and mucous membranes are normal. Posterior oropharyngeal erythema (mild) present. No oropharyngeal exudate, posterior oropharyngeal edema or tonsillar  abscesses.  Mild erythema to bilateral nasal turbinates.  Eyes: EOM are normal.  Neck: Normal range of motion.  Cardiovascular: Normal rate.   Pulses:      Dorsalis pedis pulses are 2+ on the right side.  Pulmonary/Chest: Effort normal. He has rhonchi in the right lower field and the left lower field.  Diffused rhonchi in lower fields.  Musculoskeletal: Normal range of motion.  No obvious joint effusion, erythema or deformity.  Neurological: He is alert and oriented to person, place, and time.  Skin: Skin is warm and dry.  Psychiatric: He has a normal mood and affect. His behavior is normal.  Nursing note and vitals reviewed.   ED Course  Procedures (including critical care time) DIAGNOSTIC STUDIES: Oxygen Saturation is 99% on RA, normal by my interpretation.   COORDINATION OF CARE: 5:43 PM- Will refer to orthopedics and prescribe Motrin. Encouraged pt to ice area as tolerated. Pt verbalizes understanding and agrees to plan.  Medications  albuterol (PROVENTIL HFA;VENTOLIN HFA) 108 (90 BASE) MCG/ACT inhaler 1-2 puff (2 puffs Inhalation Given 01/05/15 1806)    Labs Review Labs Reviewed - No data to display  Imaging Review Dg Chest 2 View  01/05/2015   CLINICAL DATA:  Productive cough with shortness of breath and central chest pain for 3 days, smoker, history liver disease, drug abuse  EXAM: CHEST  2 VIEW  COMPARISON:  02/04/2013  FINDINGS: Normal heart size, mediastinal contours, and pulmonary vascularity.  Mild chronic bronchitic changes.  Lungs otherwise clear.  No pleural effusion or pneumothorax.  No acute bony abnormalities.  IMPRESSION: Chronic bronchitic changes without infiltrate.   Electronically Signed   By: Lavonia Dana M.D.   On: 01/05/2015 18:38     EKG Interpretation None      MDM   Final diagnoses:  Knee pain, right  Cough  URI (upper respiratory infection)    Patient here with 2 complaints.  1. Upper respiratory infection. Pt CXR negative for acute  infiltrate. Patients symptoms are consistent with URI, likely viral etiology. Discussed that antibiotics are not indicated for viral infections. Pt will be discharged with symptomatic treatment.  Verbalizes understanding and is agreeable with plan. Pt is hemodynamically stable & in NAD prior to dc.Patient denies chest pain, shortness of breath. Mild rhonchi noted on exam, this improved after albuterol and inhaler.we'll prescribe cough medicine, and encourage over-the-counter therapy, return precautions discussed.   2. Knee pain Patient with knee pain which is consistent with an identical to knee pain he has expressive the past. No new injury to this, based on chronicity ofpatient's pain in that he has been expressing issues with that for the past 3 years, nausea and a new injury today or over the past several weeks, do not believe radiographs are warranted at this time.patient neurovascularly intact. Encouraged patient to follow up back with his orthopedic surgeon,RICE therapy discussed, patient verbalizes understanding and agreement with this plan  Patient afebrile, hemodynamically stable and in no acute distress. Patient stable for discharge.  I personally performed the services described in  this documentation, which was scribed in my presence. The recorded information has been reviewed and is accurate.  BP 115/59 mmHg  Pulse 93  Temp(Src) 98.6 F (37 C) (Oral)  Resp 16  Ht 5\' 9"  (1.753 m)  Wt 140 lb (63.504 kg)  BMI 20.67 kg/m2  SpO2 100%  Signed,  Dahlia Bailiff, PA-C 6:54 PM   Dahlia Bailiff, PA-C 01/06/15 6213  Daleen Bo, MD 01/06/15 1018

## 2015-01-05 NOTE — ED Notes (Signed)
Pt reports intermittent knee pain since injury several years ago. Pt states that he also has cough, congestion nasal drainage.

## 2015-01-05 NOTE — Discharge Instructions (Signed)
Upper Respiratory Infection, Adult An upper respiratory infection (URI) is also sometimes known as the common cold. The upper respiratory tract includes the nose, sinuses, throat, trachea, and bronchi. Bronchi are the airways leading to the lungs. Most people improve within 1 week, but symptoms can last up to 2 weeks. A residual cough may last even longer.  CAUSES Many different viruses can infect the tissues lining the upper respiratory tract. The tissues become irritated and inflamed and often become very moist. Mucus production is also common. A cold is contagious. You can easily spread the virus to others by oral contact. This includes kissing, sharing a glass, coughing, or sneezing. Touching your mouth or nose and then touching a surface, which is then touched by another person, can also spread the virus. SYMPTOMS  Symptoms typically develop 1 to 3 days after you come in contact with a cold virus. Symptoms vary from person to person. They may include:  Runny nose.  Sneezing.  Nasal congestion.  Sinus irritation.  Sore throat.  Loss of voice (laryngitis).  Cough.  Fatigue.  Muscle aches.  Loss of appetite.  Headache.  Low-grade fever. DIAGNOSIS  You might diagnose your own cold based on familiar symptoms, since most people get a cold 2 to 3 times a year. Your caregiver can confirm this based on your exam. Most importantly, your caregiver can check that your symptoms are not due to another disease such as strep throat, sinusitis, pneumonia, asthma, or epiglottitis. Blood tests, throat tests, and X-rays are not necessary to diagnose a common cold, but they may sometimes be helpful in excluding other more serious diseases. Your caregiver will decide if any further tests are required. RISKS AND COMPLICATIONS  You may be at risk for a more severe case of the common cold if you smoke cigarettes, have chronic heart disease (such as heart failure) or lung disease (such as asthma), or if  you have a weakened immune system. The very young and very old are also at risk for more serious infections. Bacterial sinusitis, middle ear infections, and bacterial pneumonia can complicate the common cold. The common cold can worsen asthma and chronic obstructive pulmonary disease (COPD). Sometimes, these complications can require emergency medical care and may be life-threatening. PREVENTION  The best way to protect against getting a cold is to practice good hygiene. Avoid oral or hand contact with people with cold symptoms. Wash your hands often if contact occurs. There is no clear evidence that vitamin C, vitamin E, echinacea, or exercise reduces the chance of developing a cold. However, it is always recommended to get plenty of rest and practice good nutrition. TREATMENT  Treatment is directed at relieving symptoms. There is no cure. Antibiotics are not effective, because the infection is caused by a virus, not by bacteria. Treatment may include:  Increased fluid intake. Sports drinks offer valuable electrolytes, sugars, and fluids.  Breathing heated mist or steam (vaporizer or shower).  Eating chicken soup or other clear broths, and maintaining good nutrition.  Getting plenty of rest.  Using gargles or lozenges for comfort.  Controlling fevers with ibuprofen or acetaminophen as directed by your caregiver.  Increasing usage of your inhaler if you have asthma. Zinc gel and zinc lozenges, taken in the first 24 hours of the common cold, can shorten the duration and lessen the severity of symptoms. Pain medicines may help with fever, muscle aches, and throat pain. A variety of non-prescription medicines are available to treat congestion and runny nose. Your caregiver  can make recommendations and may suggest nasal or lung inhalers for other symptoms.  HOME CARE INSTRUCTIONS   Only take over-the-counter or prescription medicines for pain, discomfort, or fever as directed by your  caregiver.  Use a warm mist humidifier or inhale steam from a shower to increase air moisture. This may keep secretions moist and make it easier to breathe.  Drink enough water and fluids to keep your urine clear or pale yellow.  Rest as needed.  Return to work when your temperature has returned to normal or as your caregiver advises. You may need to stay home longer to avoid infecting others. You can also use a face mask and careful hand washing to prevent spread of the virus. SEEK MEDICAL CARE IF:   After the first few days, you feel you are getting worse rather than better.  You need your caregiver's advice about medicines to control symptoms.  You develop chills, worsening shortness of breath, or brown or red sputum. These may be signs of pneumonia.  You develop yellow or brown nasal discharge or pain in the face, especially when you bend forward. These may be signs of sinusitis.  You develop a fever, swollen neck glands, pain with swallowing, or white areas in the back of your throat. These may be signs of strep throat. SEEK IMMEDIATE MEDICAL CARE IF:   You have a fever.  You develop severe or persistent headache, ear pain, sinus pain, or chest pain.  You develop wheezing, a prolonged cough, cough up blood, or have a change in your usual mucus (if you have chronic lung disease).  You develop sore muscles or a stiff neck. Document Released: 01/12/2001 Document Revised: 10/11/2011 Document Reviewed: 10/24/2013 [facility] Patient Information 2015 [place], [place]. This information is not intended to replace advice given to you by your health care provider. Make sure you discuss any questions you have with your health care provider.  Knee Pain The knee is the complex joint between your thigh and your lower leg. It is made up of bones, tendons, ligaments, and cartilage. The bones that make up the knee are:  The femur in the thigh.  The tibia and fibula in the lower leg.  The  patella or kneecap riding in the groove on the lower femur. CAUSES  Knee pain is a common complaint with many causes. A few of these causes are:  Injury, such as:  A ruptured ligament or tendon injury.  Torn cartilage.  Medical conditions, such as:  Gout  Arthritis  Infections  Overuse, over training, or overdoing a physical activity. Knee pain can be minor or severe. Knee pain can accompany debilitating injury. Minor knee problems often respond well to self-care measures or get well on their own. More serious injuries may need medical intervention or even surgery. SYMPTOMS The knee is complex. Symptoms of knee problems can vary widely. Some of the problems are:  Pain with movement and weight bearing.  Swelling and tenderness.  Buckling of the knee.  Inability to straighten or extend your knee.  Your knee locks and you cannot straighten it.  Warmth and redness with pain and fever.  Deformity or dislocation of the kneecap. DIAGNOSIS  Determining what is wrong may be very straight forward such as when there is an injury. It can also be challenging because of the complexity of the knee. Tests to make a diagnosis may include:  Your caregiver taking a history and doing a physical exam.  Routine X-rays can be used to  rule out other problems. X-rays will not reveal a cartilage tear. Some injuries of the knee can be diagnosed by:  Arthroscopy a surgical technique by which a small video camera is inserted through tiny incisions on the sides of the knee. This procedure is used to examine and repair internal knee joint problems. Tiny instruments can be used during arthroscopy to repair the torn knee cartilage (meniscus).  Arthrography is a radiology technique. A contrast liquid is directly injected into the knee joint. Internal structures of the knee joint then become visible on X-ray film.  An MRI scan is a non X-ray radiology procedure in which magnetic fields and a computer  produce two- or three-dimensional images of the inside of the knee. Cartilage tears are often visible using an MRI scanner. MRI scans have largely replaced arthrography in diagnosing cartilage tears of the knee.  Blood work.  Examination of the fluid that helps to lubricate the knee joint (synovial fluid). This is done by taking a sample out using a needle and a syringe. TREATMENT The treatment of knee problems depends on the cause. Some of these treatments are:  Depending on the injury, proper casting, splinting, surgery, or physical therapy care will be needed.  Give yourself adequate recovery time. Do not overuse your joints. If you begin to get sore during workout routines, back off. Slow down or do fewer repetitions.  For repetitive activities such as cycling or running, maintain your strength and nutrition.  Alternate muscle groups. For example, if you are a weight lifter, work the upper body on one [place] and the lower body the next.  Either tight or weak muscles do not give the proper support for your knee. Tight or weak muscles do not absorb the stress placed on the knee joint. Keep the muscles surrounding the knee strong.  Take care of mechanical problems.  If you have flat feet, orthotics or special shoes may help. See your caregiver if you need help.  Arch supports, sometimes with wedges on the inner or outer aspect of the heel, can help. These can shift pressure away from the side of the knee most bothered by osteoarthritis.  A brace called an "unloader" brace also may be used to help ease the pressure on the most arthritic side of the knee.  If your caregiver has prescribed crutches, braces, wraps or ice, use as directed. The acronym for this is PRICE. This means protection, rest, ice, compression, and elevation.  Nonsteroidal anti-inflammatory drugs (NSAIDs), can help relieve pain. But if taken immediately after an injury, they may actually increase swelling. Take NSAIDs with  food in your stomach. Stop them if you develop stomach problems. Do not take these if you have a history of ulcers, stomach pain, or bleeding from the bowel. Do not take without your caregiver's approval if you have problems with fluid retention, heart failure, or kidney problems.  For ongoing knee problems, physical therapy may be helpful.  Glucosamine and chondroitin are over-the-counter dietary supplements. Both may help relieve the pain of osteoarthritis in the knee. These medicines are different from the usual anti-inflammatory drugs. Glucosamine may decrease the rate of cartilage destruction.  Injections of a corticosteroid drug into your knee joint may help reduce the symptoms of an arthritis flare-up. They may provide pain relief that lasts a few months. You may have to wait a few months between injections. The injections do have a small increased risk of infection, water retention, and elevated blood sugar levels.  Hyaluronic acid injected  into damaged joints may ease pain and provide lubrication. These injections may work by reducing inflammation. A series of shots may give relief for as long as 6 months.  Topical painkillers. Applying certain ointments to your skin may help relieve the pain and stiffness of osteoarthritis. Ask your pharmacist for suggestions. Many over the-counter products are approved for temporary relief of arthritis pain.  In some countries, doctors often prescribe topical NSAIDs for relief of chronic conditions such as arthritis and tendinitis. A review of treatment with NSAID creams found that they worked as well as oral medications but without the serious side effects. PREVENTION  Maintain a healthy weight. Extra pounds put more strain on your joints.  Get strong, stay limber. Weak muscles are a common cause of knee injuries. Stretching is important. Include flexibility exercises in your workouts.  Be smart about exercise. If you have osteoarthritis, chronic knee  pain or recurring injuries, you may need to change the way you exercise. This does not mean you have to stop being active. If your knees ache after jogging or playing basketball, consider switching to swimming, water aerobics, or other low-impact activities, at least for a few days a week. Sometimes limiting high-impact activities will provide relief.  Make sure your shoes fit well. Choose footwear that is right for your sport.  Protect your knees. Use the proper gear for knee-sensitive activities. Use kneepads when playing volleyball or laying carpet. Buckle your seat belt every time you drive. Most shattered kneecaps occur in car accidents.  Rest when you are tired. SEEK MEDICAL CARE IF:  You have knee pain that is continual and does not seem to be getting better.  SEEK IMMEDIATE MEDICAL CARE IF:  Your knee joint feels hot to the touch and you have a high fever. MAKE SURE YOU:   Understand these instructions.  Will watch your condition.  Will get help right away if you are not doing well or get worse. Document Released: 05/16/2007 Document Revised: 10/11/2011 Document Reviewed: 05/16/2007 [facility] Patient Information 2015 [place], [place]. This information is not intended to replace advice given to you by your health care provider. Make sure you discuss any questions you have with your health care provider.   Emergency Department Resource Guide 1) Find a Doctor and Pay Out of Pocket Although you won't have to find out who is covered by your insurance plan, it is a good idea to ask around and get recommendations. You will then need to call the office and see if the doctor you have chosen will accept you as a new patient and what types of options they offer for patients who are self-pay. Some doctors offer discounts or will set up payment plans for their patients who do not have insurance, but you will need to ask so you aren't surprised when you get to your appointment.  2) Contact Your  Local Health Department Not all health departments have doctors that can see patients for sick visits, but many do, so it is worth a call to see if yours does. If you don't know where your local health department is, you can check in your phone book. The CDC also has a tool to help you locate your state's health department, and many state websites also have listings of all of their local health departments.  3) Find a [facility] If your illness is not likely to be very severe or complicated, you may want to try a walk in clinic. These are popping up all  over the country in pharmacies, drugstores, and shopping centers. They're usually staffed by nurse practitioners or physician assistants that have been trained to treat common illnesses and complaints. They're usually fairly quick and inexpensive. However, if you have serious medical issues or chronic medical problems, these are probably not your best option.  No Primary Care Doctor: - Call Health Connect at  [phone] - they can help you locate a primary care doctor that  accepts your insurance, provides certain services, etc. - Physician Referral Service- [phone]  Chronic Pain Problems: Organization         Address  Phone   Notes  [facility]  [phone] Patients need to be referred by their primary care doctor.   Medication Assistance: Organization         Address  Phone   Notes  [facility] Medication [facility] [address] [place], [place] [address] [phone] --Must be a resident of [facility] -- Must have NO insurance coverage whatsoever (no Medicaid/ Medicare, etc.) -- The pt. MUST have a primary care doctor that directs their care regularly and follows them in the community   Med[facility]ist  [phone]   [employer]  [phone]    Agencies that provide inexpensive medical care: Organization         Address  Phone   Notes  [place]  [phone]   [facility]    [phone]   [facility] [place], [place] [address] [phone]   [place] [address], [place] [phone]   Planned Parenthood    [phone]   [facility]    [phone]   [place] and [place] [address], [place] Phone:  [phone], Fax:  [phone] Hours of Operation:  9 am - 6 pm, M-F.  Also accepts Medicaid/Medicare and self-pay.  [facility] for [place], [address], [place] Phone: [phone], Fax: [phone]. Hours of Operation:  8:30 am - 5:30 pm, M-F.  Also accepts Medicaid and self-pay.  [facility] [address], [place] Phone: [phone]   [place], [place], [place] [phone], Ext. [phone] Mondays & Thursdays: 7-9 AM.  First 15 patients are seen on a first come, first serve basis.    [place] Providers:  Organization         Address  Phone   Notes  [facility] [address], [place] [phone] Also accepts self-pay patients.  [facility] [address], [place]  [phone]   [place], [address], [place] [phone]   [facility] [address], [place] [phone]   Lucianne Lei [address], [place]   [phone] Only accepts [place] Access [place] patients after they have their name applied to their card.   Self-Pay (no insurance) in [facility]:  Organization         Address  Phone   Notes  Sickle Cell Patients, [facility] [place] [phone]   [facility] [place] [phone]   [facility] [place]  [address],  [address], [place] [phone]   [facility]/Dr. Osei-Bonsu  [address] [place] or [address], [place] [phone] Phone number for both [place] and [place] locations is the same.  [facility] [place] [address] [place] [phone]   [facility] [address] [place] or [address] [phone]   [facility] [address], [place] [phone], phone; [phone], fax Sees patients 1st and 3rd Saturday of every month.  Must not qualify for public or private insurance (i.e. Medicaid, Medicare, [place] Health Choice, Veterans' Benefits)  Household income should be no more than 200% of the poverty level The clinic cannot treat you if you are pregnant or think you are pregnant  Sexually transmitted diseases are not treated at the clinic.    Dental Care: Organization         Address  Phone  Notes  [facility] Department of [facility] [place] [phone] Accepts children up to age 12 who are enrolled in [place] or [place]; pregnant women with a Medicaid card; and children who have applied for Medicaid or [place] Health Choice, but were declined, whose parents can pay a reduced fee at time of service.  [facility] Department of [facility]  [address], [place] [phone] Accepts children up to age 73 who are enrolled in [place] or [place]; pregnant women with a Medicaid card; and children who have applied for Medicaid or  Health Choice, but were declined, whose parents can pay a reduced fee at time of service.  [place] Adult Dental Access PROGRAM  [place] [phone] Patients are seen by appointment only. Walk-ins are not accepted. [place] will see patients 45 years of age and older. Monday - Tuesday (8am-5pm) Most  Wednesdays (8:30-5pm) $30 per visit, cash only  [facility] Adult Dental Access PROGRAM  [address], [facility] [phone] Patients are seen by appointment only. Walk-ins are not accepted. [place] will see patients 20 years of age and older. One Wednesday Evening (Monthly: Volunteer Based).  $30 per visit, cash only  [place]  [phone] for adults; Children under age 31, call Graduate Pediatric Dentistry at [phone]. Children aged 33-14, please call [phone] to request a pediatric application.  Dental services are provided in all areas of dental care including fillings, crowns and bridges, complete and partial dentures, implants, gum treatment, root canals, and extractions. Preventive care is also provided. Treatment is provided to both adults and children. Patients are selected via a lottery and there is often a waiting list.   [facility] [address], [place]  [phone] [email or web address]   [facility] [address] [place], [place] [phone], Ext. [phone] Second and Fourth Thursday of each month, opens at 6:30 AM; Clinic ends at 9 AM.  Patients are seen on a first-come first-served basis, and a limited number are seen during each clinic.   [facility]  [address] [place], [place] [phone]   Eligibility Requirements You must have lived in [place], [place], or [place] counties for at least the last three months.   You cannot be eligible for state or federal sponsored [employer], including [employer], [place], or [employer].  You generally cannot be eligible for healthcare insurance through your employer.    How to apply: Eligibility screenings are held every Tuesday and Wednesday afternoon from 1:00 pm until 4:00 pm. You do not need an appointment for the interview!  [facility] [address], [place], [place]    [place]  [place] Department  [place]  [phone]    Behavioral Health Resources in the Community: Intensive Outpatient Programs Organization         Address  Phone  Notes  [place]. [address], [place], [place] [phone]   [facility] [address], [place], [place]   ADS: Alcohol & Drug S[facility]cs [address], [place], [place]   [place] [address]  [place], [place] or [phone]   Substance Abuse Resources Organization         Address  Phone  Notes  Alcohol and Drug Services  [phone]   [place]  [phone]   [facility] [place]   [place]  [phone]   Residential & Outpatient Substance Abuse Program  [phone]   Psychological Services Organization         Address  Phone  Notes  [facility]  [place]  [phone]   [place] [address] [place] or [phone]    Mobile Crisis Teams Organization         Address  Phone  Notes  [facility], Mobile Crisis Care Unit  [phone]   [facility]  [address] [place], [place]   [place] [address] [place] [phone]    Self-Help/Support Groups Organization         Address  Phone             Notes  [place]. of [place] - variety of support groups  [place] Call for more information  Narcotics Anonymous (NA), [facility] [address], [employer] [place]  2 meetings at this location   Special educational needs teacher         Address  Phone  Notes  [facility] [place],    [place]  [phone]   [facility]  [address], [place]  [address], [place], [place]   [place], [place] [phone] Admissions: 8am-3pm M-F  Incentives Substance [place] [address]    [place], [place] [phone]   [facility] [address] [place], [place], [place]   [facility] [address]  [place], [place]   [facility] - Intensive Outpatient [address], [place], [place]   [facility] [place].,  [place], [place] [phone] or [phone]   [facility] Services (RTS) [address], [place], [place] Accepts Medicaid  [facility] [place] [address]  [place] [phone] Substance Abuse/Addiction Treatment   [facility] Organization         Address  Phone  Notes  [facility]  [phone]   Domenic Schwab, PhD [address] [place], [place]   [phone] or [phone]   [place]  [place], [place] [phone]   [facility] [address] [place], [place] [phone] Insurance/Medicaid/sponsorship through [facility] and Families [address] [place], [place] [phone] [place], [place] [phone]    Dr. Adele Schilder  [phone]   [facility] [place] Dept. 1) [address] [place] 2) [place] 3)  [address], [place] [phone]  [phone]   [place] [phone] or [phone] (After Hours)

## 2016-06-06 ENCOUNTER — Encounter (HOSPITAL_COMMUNITY): Payer: Self-pay | Admitting: Emergency Medicine

## 2016-06-06 ENCOUNTER — Emergency Department (HOSPITAL_COMMUNITY)
Admission: EM | Admit: 2016-06-06 | Discharge: 2016-06-06 | Disposition: A | Discharge status: Home / Self Care | Payer: Self-pay | Attending: Emergency Medicine | Admitting: Emergency Medicine

## 2016-06-06 ENCOUNTER — Emergency Department (HOSPITAL_COMMUNITY): Payer: Self-pay

## 2016-06-06 DIAGNOSIS — Y999 Unspecified external cause status: Secondary | ICD-10-CM | POA: Insufficient documentation

## 2016-06-06 DIAGNOSIS — Y9389 Activity, other specified: Secondary | ICD-10-CM | POA: Insufficient documentation

## 2016-06-06 DIAGNOSIS — F17210 Nicotine dependence, cigarettes, uncomplicated: Secondary | ICD-10-CM | POA: Insufficient documentation

## 2016-06-06 DIAGNOSIS — R0789 Other chest pain: Secondary | ICD-10-CM | POA: Insufficient documentation

## 2016-06-06 DIAGNOSIS — W19XXXA Unspecified fall, initial encounter: Secondary | ICD-10-CM

## 2016-06-06 DIAGNOSIS — Y92002 Bathroom of unspecified non-institutional (private) residence single-family (private) house as the place of occurrence of the external cause: Secondary | ICD-10-CM | POA: Insufficient documentation

## 2016-06-06 DIAGNOSIS — W182XXA Fall in (into) shower or empty bathtub, initial encounter: Secondary | ICD-10-CM | POA: Insufficient documentation

## 2016-06-06 MED ORDER — CYCLOBENZAPRINE HCL 10 MG PO TABS: 5.0000 mg | ORAL_TABLET | Freq: Once | ORAL | Status: DC

## 2016-06-06 MED ORDER — KETOROLAC TROMETHAMINE 60 MG/2ML IM SOLN
60.0000 mg | Freq: Once | INTRAMUSCULAR | Status: AC
  Administered 2016-06-06: 60 mg via INTRAMUSCULAR
  Filled 2016-06-06: qty 2

## 2016-06-06 MED ORDER — CYCLOBENZAPRINE HCL 10 MG PO TABS: 10.0000 mg | ORAL_TABLET | Freq: Two times a day (BID) | ORAL | 0 refills | Status: DC | PRN

## 2016-06-06 NOTE — ED Triage Notes (Signed)
Pt. Stated, I fell last night  In the bathroom and hit the toilet on my rt. Side. Pain on the right back side. Pt. Stated, I did hit my head . No LOC.

## 2016-06-06 NOTE — ED Provider Notes (Signed)
[facility] DEPT Provider Note   CSN: [id number] Arrival date & time: 06/06/16  1502     History   Chief Complaint Chief Complaint  Patient presents with  . Fall  . Rib Injury    HPI Gary Hicks is a 30 y.o. male.  HPI  This is a 30 year old man with a history of heroin abuse who presents today complaining of pain in his right rib cage after a fall last night. He states that he slipped getting around the bathtub and struck the right posterior side of his rib cage on the commode rim. He did not strike his head or lose consciousness. He denies any other injuries. He states he took ibuprofen at home without relief. He denies headache, neck pain, dyspnea, abdominal pain, or being on blood thinners. Pain is 7 out of 10. It was not relieved with ibuprofen at home.  Past Medical History:  Diagnosis Date  . Heroin abuse   . Liver disease    possible Hep. C no treatment or confirmation     Patient Active Problem List   Diagnosis Date Noted  . Heroin abuse 01/05/2014  . Polysubstance abuse 01/05/2014    Past Surgical History:  Procedure Laterality Date  . CHOLECYSTECTOMY    . HAND SURGERY         Home Medications    Prior to Admission medications   Medication Sig Start Date End Date Taking? Authorizing Provider  benzonatate (TESSALON) 100 MG capsule Take 1 capsule (100 mg total) by mouth every 8 (eight) hours. 01/05/15   Dahlia Bailiff, PA-C  HYDROcodone-acetaminophen (NORCO/VICODIN) 5-325 MG per tablet Take 1 tablet by mouth every 6 (six) hours as needed for moderate pain. 05/29/14   Junius Creamer, NP  ibuprofen (ADVIL,MOTRIN) 800 MG tablet Take 1 tablet (800 mg total) by mouth 3 (three) times daily. 01/05/15   Dahlia Bailiff, PA-C  sulfamethoxazole-trimethoprim (SEPTRA DS) 800-160 MG per tablet Take 1 tablet by mouth 2 (two) times daily. 05/29/14   Junius Creamer, NP    Family History No family history on file.  Social History Social History  Substance Use Topics  . Smoking  status: Current Every [place] Smoker    Packs/[place]: 0.50    Types: Cigarettes  . Smokeless tobacco: Current User  . Alcohol use No     Allergies   Prednisone and Amoxicillin   Review of Systems Review of Systems  Constitutional: Negative.   HENT: Negative.   Respiratory: Negative.   Cardiovascular: Positive for chest pain.  Gastrointestinal: Negative.   Genitourinary: Negative.   Musculoskeletal: Negative.   Neurological: Negative.      Physical Exam Updated Vital Signs BP 147/92 (BP Location: Left Arm)   Pulse 106   Temp 98.2 F (36.8 C) (Oral)   Resp 17   SpO2 98%   Physical Exam   ED Treatments / Results  Labs (all labs ordered are listed, but only abnormal results are displayed) Labs Reviewed - No data to display  EKG  EKG Interpretation None       Radiology No results found.  Procedures Procedures (including critical care time)  Medications Ordered in ED Medications  cyclobenzaprine (FLEXERIL) tablet 5 mg (not administered)  ketorolac (TORADOL) injection 60 mg (60 mg Intramuscular Given 06/06/16 1532)     Initial Impression / Assessment and Plan / ED Course  I have reviewed the triage vital signs and the nursing notes.  Pertinent labs & imaging results that were available during my care of the  patient were reviewed by me and considered in my medical decision making (see chart for details).  Clinical Course     No fracture seen on cxr.  Patient slightly tachycardiac.   Final Clinical Impressions(s) / ED Diagnoses   Final diagnoses:  Fall, initial encounter  Chest wall pain    New Prescriptions New Prescriptions   CYCLOBENZAPRINE (FLEXERIL) 10 MG TABLET    Take 1 tablet (10 mg total) by mouth 2 (two) times daily as needed for muscle spasms.     Pattricia Boss, MD 06/06/16 [phone]

## 2016-07-06 ENCOUNTER — Emergency Department (HOSPITAL_COMMUNITY): Payer: Self-pay

## 2016-07-06 ENCOUNTER — Encounter (HOSPITAL_COMMUNITY): Payer: Self-pay | Admitting: Emergency Medicine

## 2016-07-06 ENCOUNTER — Emergency Department (HOSPITAL_COMMUNITY)
Admission: EM | Admit: 2016-07-06 | Discharge: 2016-07-06 | Disposition: A | Discharge status: Home / Self Care | Payer: Self-pay | Attending: Emergency Medicine | Admitting: Emergency Medicine

## 2016-07-06 DIAGNOSIS — Z79899 Other long term (current) drug therapy: Secondary | ICD-10-CM | POA: Insufficient documentation

## 2016-07-06 DIAGNOSIS — Y999 Unspecified external cause status: Secondary | ICD-10-CM | POA: Insufficient documentation

## 2016-07-06 DIAGNOSIS — R200 Anesthesia of skin: Secondary | ICD-10-CM

## 2016-07-06 DIAGNOSIS — M545 Low back pain: Secondary | ICD-10-CM | POA: Insufficient documentation

## 2016-07-06 DIAGNOSIS — W228XXA Striking against or struck by other objects, initial encounter: Secondary | ICD-10-CM | POA: Insufficient documentation

## 2016-07-06 DIAGNOSIS — M549 Dorsalgia, unspecified: Secondary | ICD-10-CM

## 2016-07-06 DIAGNOSIS — F17210 Nicotine dependence, cigarettes, uncomplicated: Secondary | ICD-10-CM | POA: Insufficient documentation

## 2016-07-06 DIAGNOSIS — M6281 Muscle weakness (generalized): Secondary | ICD-10-CM | POA: Insufficient documentation

## 2016-07-06 DIAGNOSIS — Y92002 Bathroom of unspecified non-institutional (private) residence single-family (private) house as the place of occurrence of the external cause: Secondary | ICD-10-CM | POA: Insufficient documentation

## 2016-07-06 DIAGNOSIS — Y939 Activity, unspecified: Secondary | ICD-10-CM | POA: Insufficient documentation

## 2016-07-06 DIAGNOSIS — R29898 Other symptoms and signs involving the musculoskeletal system: Secondary | ICD-10-CM

## 2016-07-06 MED ORDER — KETOROLAC TROMETHAMINE 60 MG/2ML IM SOLN
60.0000 mg | Freq: Once | INTRAMUSCULAR | Status: AC
  Administered 2016-07-06: 60 mg via INTRAMUSCULAR
  Filled 2016-07-06: qty 2

## 2016-07-06 MED ORDER — TRAMADOL HCL 50 MG PO TABS
50.0000 mg | ORAL_TABLET | Freq: Once | ORAL | Status: AC
  Administered 2016-07-06: 50 mg via ORAL
  Filled 2016-07-06: qty 1

## 2016-07-06 MED ORDER — CYCLOBENZAPRINE HCL 10 MG PO TABS: 10.0000 mg | ORAL_TABLET | Freq: Two times a day (BID) | ORAL | 0 refills | Status: DC | PRN

## 2016-07-06 MED ORDER — DEXAMETHASONE 2 MG PO TABS: 10.0000 mg | ORAL_TABLET | Freq: Once | ORAL | 0 refills | Status: AC

## 2016-07-06 MED ORDER — CYCLOBENZAPRINE HCL 10 MG PO TABS
10.0000 mg | ORAL_TABLET | Freq: Once | ORAL | Status: AC
Start: 2016-07-06 — End: 2016-07-06
  Administered 2016-07-06: 10 mg via ORAL
  Filled 2016-07-06: qty 1

## 2016-07-06 MED ORDER — DEXAMETHASONE 4 MG PO TABS
10.0000 mg | ORAL_TABLET | Freq: Once | ORAL | Status: AC
  Administered 2016-07-06: 10 mg via ORAL
  Filled 2016-07-06: qty 3

## 2016-07-06 NOTE — ED Triage Notes (Addendum)
Pt states "I got up to use the bathroom and my back gave out on me and I collapsed on the floor, and I hit my chin on the sink and I bruised up my lip and I couldn't feel my leg from the knee down." Pt states this episode happened at 3am this morning. Pt states his right foot is numb, full rom with legs, pt has good cap refill on all toes on R foot, able to move toes, strong pulses palpated. Pt has no obvious injuries or deformities. Pt ambulatory with limp.  Denies LOC.

## 2016-07-06 NOTE — ED Notes (Signed)
Pt undressed and in gown. MRI ETA 30 minutes, updated pt.
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
## 2016-07-06 NOTE — ED Notes (Signed)
Patient taken to MRI.  No current distress noted

## 2016-07-06 NOTE — ED Notes (Signed)
Patient transported to MRI 

## 2016-07-15 NOTE — ED Provider Notes (Signed)
Addendum to 06/06/2016 note- late entry PE- vitals as noted on original chart WDWN male nad Heent- no trauma noted Neck no ttp  chest wall - no signs of trauma visually, no crepitance, some ttp right posterior chest wall Abdomen- soft and nontender Extremities - no acute signs of trauma   Pattricia Boss, MD 07/15/16 1338

## 2017-09-10 ENCOUNTER — Emergency Department (HOSPITAL_COMMUNITY): Payer: Self-pay

## 2017-09-10 ENCOUNTER — Encounter (HOSPITAL_COMMUNITY): Payer: Self-pay | Admitting: *Deleted

## 2017-09-10 ENCOUNTER — Inpatient Hospital Stay (HOSPITAL_COMMUNITY)
Admission: EM | Admit: 2017-09-10 | Discharge: 2017-09-11 | DRG: 872 | Discharge status: Against Medical Advice | Payer: Self-pay | Attending: Internal Medicine | Admitting: Internal Medicine

## 2017-09-10 ENCOUNTER — Other Ambulatory Visit: Payer: Self-pay

## 2017-09-10 DIAGNOSIS — E876 Hypokalemia: Secondary | ICD-10-CM | POA: Diagnosis present

## 2017-09-10 DIAGNOSIS — N179 Acute kidney failure, unspecified: Secondary | ICD-10-CM | POA: Diagnosis present

## 2017-09-10 DIAGNOSIS — A419 Sepsis, unspecified organism: Secondary | ICD-10-CM | POA: Diagnosis present

## 2017-09-10 DIAGNOSIS — R6520 Severe sepsis without septic shock: Secondary | ICD-10-CM | POA: Diagnosis present

## 2017-09-10 DIAGNOSIS — R7881 Bacteremia: Secondary | ICD-10-CM

## 2017-09-10 DIAGNOSIS — D6959 Other secondary thrombocytopenia: Secondary | ICD-10-CM | POA: Diagnosis present

## 2017-09-10 DIAGNOSIS — I330 Acute and subacute infective endocarditis: Secondary | ICD-10-CM

## 2017-09-10 DIAGNOSIS — F17210 Nicotine dependence, cigarettes, uncomplicated: Secondary | ICD-10-CM | POA: Diagnosis present

## 2017-09-10 DIAGNOSIS — Z880 Allergy status to penicillin: Secondary | ICD-10-CM

## 2017-09-10 DIAGNOSIS — D696 Thrombocytopenia, unspecified: Secondary | ICD-10-CM

## 2017-09-10 DIAGNOSIS — K769 Liver disease, unspecified: Secondary | ICD-10-CM | POA: Diagnosis present

## 2017-09-10 DIAGNOSIS — R569 Unspecified convulsions: Secondary | ICD-10-CM | POA: Diagnosis present

## 2017-09-10 DIAGNOSIS — F1910 Other psychoactive substance abuse, uncomplicated: Secondary | ICD-10-CM | POA: Diagnosis present

## 2017-09-10 DIAGNOSIS — Z79899 Other long term (current) drug therapy: Secondary | ICD-10-CM

## 2017-09-10 DIAGNOSIS — D649 Anemia, unspecified: Secondary | ICD-10-CM | POA: Diagnosis present

## 2017-09-10 DIAGNOSIS — Z791 Long term (current) use of non-steroidal anti-inflammatories (NSAID): Secondary | ICD-10-CM

## 2017-09-10 DIAGNOSIS — A4101 Sepsis due to Methicillin susceptible Staphylococcus aureus: Principal | ICD-10-CM | POA: Diagnosis present

## 2017-09-10 DIAGNOSIS — R52 Pain, unspecified: Secondary | ICD-10-CM

## 2017-09-10 DIAGNOSIS — Z888 Allergy status to other drugs, medicaments and biological substances status: Secondary | ICD-10-CM

## 2017-09-10 DIAGNOSIS — F1110 Opioid abuse, uncomplicated: Secondary | ICD-10-CM | POA: Diagnosis present

## 2017-09-10 HISTORY — DX: Unspecified convulsions: R56.9

## 2017-09-10 LAB — CBC
HCT: 40.1 % (ref 39.0–52.0)
Hemoglobin: 14.2 g/dL (ref 13.0–17.0)
MCH: 32.6 pg (ref 26.0–34.0)
MCHC: 35.4 g/dL (ref 30.0–36.0)
MCV: 92.2 fL (ref 78.0–100.0)
Platelets: 49 10*3/uL — ABNORMAL LOW (ref 150–400)
RBC: 4.35 MIL/uL (ref 4.22–5.81)
RDW: 13.6 % (ref 11.5–15.5)
WBC: 11.5 10*3/uL — ABNORMAL HIGH (ref 4.0–10.5)

## 2017-09-10 LAB — COMPREHENSIVE METABOLIC PANEL
ALT: 42 U/L (ref 17–63)
AST: 45 U/L — ABNORMAL HIGH (ref 15–41)
Albumin: 2.6 g/dL — ABNORMAL LOW (ref 3.5–5.0)
Alkaline Phosphatase: 148 U/L — ABNORMAL HIGH (ref 38–126)
Anion gap: 17 — ABNORMAL HIGH (ref 5–15)
BUN: 33 mg/dL — ABNORMAL HIGH (ref 6–20)
CO2: 22 mmol/L (ref 22–32)
Calcium: 8.2 mg/dL — ABNORMAL LOW (ref 8.9–10.3)
Chloride: 91 mmol/L — ABNORMAL LOW (ref 101–111)
Creatinine, Ser: 1.4 mg/dL — ABNORMAL HIGH (ref 0.61–1.24)
GFR calc Af Amer: >60 mL/min (ref >60)
GFR calc non Af Amer: >60 mL/min (ref >60)
Glucose, Bld: 99 mg/dL (ref 65–99)
Potassium: 3.8 mmol/L (ref 3.5–5.1)
Sodium: 130 mmol/L — ABNORMAL LOW (ref 135–145)
Total Bilirubin: 1.2 mg/dL (ref 0.3–1.2)
Total Protein: 6.6 g/dL (ref 6.5–8.1)

## 2017-09-10 LAB — URINALYSIS, ROUTINE W REFLEX MICROSCOPIC
Bilirubin Urine: NEGATIVE
Glucose, UA: NEGATIVE mg/dL
Hgb urine dipstick: NEGATIVE
Ketones, ur: NEGATIVE mg/dL
Leukocytes, UA: NEGATIVE
Nitrite: NEGATIVE
Protein, ur: NEGATIVE mg/dL
Specific Gravity, Urine: 1.019 (ref 1.005–1.030)
pH: 6 (ref 5.0–8.0)

## 2017-09-10 LAB — I-STAT CG4 LACTIC ACID, ED
Lactic Acid, Venous: 2.82 mmol/L (ref 0.5–1.9)
Lactic Acid, Venous: 1.89 mmol/L (ref 0.5–1.9)

## 2017-09-10 LAB — INFLUENZA PANEL BY PCR (TYPE A & B)
Influenza A By PCR: NEGATIVE
Influenza B By PCR: NEGATIVE

## 2017-09-10 LAB — SEDIMENTATION RATE: Sed Rate: 37 mm/hr — ABNORMAL HIGH (ref 0–16)

## 2017-09-10 LAB — C-REACTIVE PROTEIN: CRP: 35.1 mg/dL — ABNORMAL HIGH (ref <1.0)

## 2017-09-10 MED ORDER — DEXTROSE 5 % IV SOLN
2.0000 g | Freq: Three times a day (TID) | INTRAVENOUS | Status: DC
  Administered 2017-09-10 – 2017-09-11 (×2): 2 g via INTRAVENOUS
  Filled 2017-09-10 (×2): qty 2

## 2017-09-10 MED ORDER — VANCOMYCIN HCL IN DEXTROSE 750-5 MG/150ML-% IV SOLN
750.0000 mg | Freq: Two times a day (BID) | INTRAVENOUS | Status: DC
  Administered 2017-09-11: 750 mg via INTRAVENOUS
  Filled 2017-09-10: qty 150

## 2017-09-10 MED ORDER — SODIUM CHLORIDE 0.9 % IV BOLUS (SEPSIS)
1000.0000 mL | Freq: Once | INTRAVENOUS | Status: AC
Start: 2017-09-10 — End: 2017-09-10
  Administered 2017-09-10: 1000 mL via INTRAVENOUS

## 2017-09-10 MED ORDER — LORAZEPAM 2 MG/ML IJ SOLN
1.0000 mg | Freq: Once | INTRAMUSCULAR | Status: AC
  Administered 2017-09-10: 1 mg via INTRAVENOUS
  Filled 2017-09-10: qty 1

## 2017-09-10 MED ORDER — VANCOMYCIN HCL 10 G IV SOLR
1250.0000 mg | Freq: Once | INTRAVENOUS | Status: AC
  Administered 2017-09-10: 1250 mg via INTRAVENOUS
  Filled 2017-09-10: qty 1250

## 2017-09-10 MED ORDER — SODIUM CHLORIDE 0.9 % IV BOLUS (SEPSIS)
1000.0000 mL | Freq: Once | INTRAVENOUS | Status: AC
  Administered 2017-09-10: 1000 mL via INTRAVENOUS

## 2017-09-10 MED ORDER — IOPAMIDOL (ISOVUE-300) INJECTION 61%
INTRAVENOUS | Status: AC
  Administered 2017-09-10: 75 mL
  Filled 2017-09-10: qty 75

## 2017-09-10 MED ORDER — HYDROMORPHONE HCL 1 MG/ML IJ SOLN
1.0000 mg | Freq: Once | INTRAMUSCULAR | Status: AC
  Administered 2017-09-10: 1 mg via INTRAVENOUS
  Filled 2017-09-10: qty 1

## 2017-09-10 MED ORDER — LEVOFLOXACIN IN D5W 750 MG/150ML IV SOLN
750.0000 mg | Freq: Once | INTRAVENOUS | Status: AC
  Administered 2017-09-10: 750 mg via INTRAVENOUS
  Filled 2017-09-10: qty 150

## 2017-09-10 NOTE — ED Notes (Signed)
Recollect one set of blood cultures due to not labeled.

## 2017-09-10 NOTE — ED Provider Notes (Addendum)
[facility] EMERGENCY DEPARTMENT Provider Note   CSN: [id number] Arrival date & time: 09/10/17  1643     History   Chief Complaint Chief Complaint  Patient presents with  . Generalized Body Aches    HPI Gary Hicks is a 31 y.o. male.  HPI   32 year old male with multiple complaints. He is accompanied by his father. Primarily, he has been having severe and progressive pain in multiple joints.  Primarily bilateral lower extremities.  Hips, knees and ankles.  Also significant pain in his right shoulder.  He denies any acute trauma.  He is an IV drug abuser.  His father also reports 2 episodes of seizure-like activity.  One witnessed by his girlfriend on January 30 and another witnessed by his father about a week prior to this.  Father describes generalized tonic-clonic activity at that time.  EMS was called to evaluate him but by the time they arrived he was awake enough to refuse transport.  He does endorse a headache.  He feels hot and cold but no reported temperature.  No acute respiratory complaints.  Past Medical History:  Diagnosis Date  . Heroin abuse ([place])   . Liver disease    possible Hep. C no treatment or confirmation   . Seizures [facility])     Patient Active Problem List   Diagnosis Date Noted  . Heroin abuse ([place]) 01/05/2014  . Polysubstance abuse ([place]) 01/05/2014    Past Surgical History:  Procedure Laterality Date  . CHOLECYSTECTOMY    . HAND SURGERY         Home Medications    Prior to Admission medications   Medication Sig Start Date End Date Taking? Authorizing Provider  benzonatate (TESSALON) 100 MG capsule Take 1 capsule (100 mg total) by mouth every 8 (eight) hours. 01/05/15   Dahlia Bailiff, PA-C  cyclobenzaprine (FLEXERIL) 10 MG tablet Take 1 tablet (10 mg total) by mouth 2 (two) times daily as needed for muscle spasms. 07/06/16   Gareth Morgan, MD  HYDROcodone-acetaminophen (NORCO/VICODIN) 5-325 MG per tablet Take 1 tablet by mouth  every 6 (six) hours as needed for moderate pain. 05/29/14   Junius Creamer, NP  ibuprofen (ADVIL,MOTRIN) 800 MG tablet Take 1 tablet (800 mg total) by mouth 3 (three) times daily. 01/05/15   Dahlia Bailiff, PA-C  sulfamethoxazole-trimethoprim (SEPTRA DS) 800-160 MG per tablet Take 1 tablet by mouth 2 (two) times daily. 05/29/14   Junius Creamer, NP    Family History No family history on file.  Social History Social History   Tobacco Use  . Smoking status: Current Every [place] Smoker    Packs/Balz: 0.50    Types: Cigarettes  . Smokeless tobacco: Current User  Substance Use Topics  . Alcohol use: No  . Drug use: Yes    Types: IV    Comment: heroin     Allergies   Prednisone and Amoxicillin   Review of Systems Review of Systems  All systems reviewed and negative, other than as noted in HPI.  Physical Exam Updated Vital Signs BP 99/68 (BP Location: Left Arm)   Pulse (!) 125   Temp 97.8 F (36.6 C) (Oral)   Resp 20   Ht 5\' 9"  (1.753 m)   Wt 63.5 kg (140 lb)   SpO2 98%   BMI 20.67 kg/m   Physical Exam  Constitutional:  Laying in bed.  Appears tired and also very uncomfortable.  HENT:  Head: Normocephalic and atraumatic.  Eyes: Conjunctivae are normal.  Right eye exhibits no discharge. Left eye exhibits no discharge.  Neck: Neck supple.  Cardiovascular: Regular rhythm and normal heart sounds. Exam reveals no gallop and no friction rub.  No murmur heard. Tachycardic.  I cannot clearly appreciate a murmur.  Pulmonary/Chest: Effort normal and breath sounds normal. No respiratory distress.  Abdominal: Soft. He exhibits no distension. There is no tenderness.  Musculoskeletal: He exhibits no edema or tenderness.  Innumerable track marks.  Severe pain with palpation and range of motion of the right shoulder.  No overlying skin changes.  Severe pain with attempted range of motion of either hip.  Inguinal adenopathy.  Erythema over bilateral anterior knees.  Small effusion left knee.   Severe pain with both passive and active range of motion.  Pain with range of motion of either ankle.  Petechia both feet.  Indurated region near right calf but no erythema or fluctuance.  Suspect this is a chronic skin change.  Patient denies any significant discrete localized tenderness on palpation there.  No erythema over the dorsum of the distal right hand.  No discrete abscess.  Neurological: He is alert.  Skin: Skin is warm and dry.  Psychiatric: He has a normal mood and affect. His behavior is normal. Thought content normal.  Nursing note and vitals reviewed.    ED Treatments / Results  Labs (all labs ordered are listed, but only abnormal results are displayed) Labs Reviewed  COMPREHENSIVE METABOLIC PANEL - Abnormal; Notable for the following components:      Result Value   Sodium 130 (*)    Chloride 91 (*)    BUN 33 (*)    Creatinine, Ser 1.40 (*)    Calcium 8.2 (*)    Albumin 2.6 (*)    AST 45 (*)    Alkaline Phosphatase 148 (*)    Anion gap 17 (*)    All other components within normal limits  CBC - Abnormal; Notable for the following components:   WBC 11.5 (*)    Platelets 49 (*)    All other components within normal limits  SEDIMENTATION RATE - Abnormal; Notable for the following components:   Sed Rate 37 (*)    All other components within normal limits  C-REACTIVE PROTEIN - Abnormal; Notable for the following components:   CRP 35.1 (*)    All other components within normal limits  I-STAT CG4 LACTIC ACID, ED - Abnormal; Notable for the following components:   Lactic Acid, Venous 2.82 (*)    All other components within normal limits  CULTURE, BLOOD (ROUTINE X 2)  CULTURE, BLOOD (ROUTINE X 2)  INFLUENZA PANEL BY PCR (TYPE A & B)  URINALYSIS, ROUTINE W REFLEX MICROSCOPIC  I-STAT CG4 LACTIC ACID, ED    EKG  EKG Interpretation  Date/Time:  Saturday September 10 2017 19:40:54 EST Ventricular Rate:  142 PR Interval:    QRS Duration: 108 QT Interval:  308 QTC  Calculation: 474 R Axis:   86 Text Interpretation:  Sinus tachycardia Borderline T wave abnormalities Borderline prolonged QT interval Confirmed by Virgel Manifold [phone]) on 09/10/2017 10:14:22 PM       Radiology Ct Head W Or Wo Contrast  Result Date: 09/10/2017 CLINICAL DATA:  Endocarditis.  Infection suspected. EXAM: CT HEAD WITHOUT AND WITH CONTRAST TECHNIQUE: Contiguous axial images were obtained from the base of the skull through the vertex without and with intravenous contrast CONTRAST:  81mL ISOVUE-300 IOPAMIDOL (ISOVUE-300) INJECTION 61% COMPARISON:  None. FINDINGS: Brain: No acute infarct, hemorrhage, or  mass lesion is present. The ventricles are of normal size. No significant extraaxial fluid collection is present. Postcontrast images demonstrate no pathologic enhancement. A mega cisterna magna is noted. Cerebellum is within normal limits. Brainstem is unremarkable. Vascular: No significant vascular lesions are present. Skull: Calvarium is intact. No focal lytic or blastic lesions are present. Sinuses/Orbits: The paranasal sinuses and mastoid air cells are clear. Globes and orbits are within normal limits. IMPRESSION: Negative CT of the head without and with contrast. No focal lesions or enhancement to suggest septic emboli. Electronically Signed   By: San Morelle M.D.   On: 09/10/2017 20:59    Procedures Procedures (including critical care time)  CRITICAL CARE Performed by: Virgel Manifold Total critical care time: 35 minutes Critical care time was exclusive of separately billable procedures and treating other patients. Critical care was necessary to treat or prevent imminent or life-threatening deterioration. Critical care was time spent personally by me on the following activities: development of treatment plan with patient and/or surrogate as well as nursing, discussions with consultants, evaluation of patient's response to treatment, examination of patient, obtaining history  from patient or surrogate, ordering and performing treatments and interventions, ordering and review of laboratory studies, ordering and review of radiographic studies, pulse oximetry and re-evaluation of patient's condition.   Angiocath insertion Performed by: Virgel Manifold  Consent: Verbal consent obtained. Risks and benefits: risks, benefits and alternatives were discussed Time out: Immediately prior to procedure a "time out" was called to verify the correct patient, procedure, equipment, support staff and site/side marked as required.  Preparation: Patient was prepped and draped in the usual sterile fashion.  Vein Location: L AC  Ultrasound Guided  Gauge: 20  Normal blood return and flush without difficulty Patient tolerance: Patient tolerated the procedure well with no immediate complications.     Medications Ordered in ED Medications  aztreonam (AZACTAM) 2 g in dextrose 5 % 50 mL IVPB (2 g Intravenous New Bag/Given 09/10/17 2148)  vancomycin (VANCOCIN) IVPB 750 mg/150 ml premix (not administered)  vancomycin (VANCOCIN) 1,250 mg in sodium chloride 0.9 % 250 mL IVPB (0 mg Intravenous Stopped 09/10/17 2143)  sodium chloride 0.9 % bolus 1,000 mL (0 mLs Intravenous Stopped 09/10/17 2053)  iopamidol (ISOVUE-300) 61 % injection (75 mLs  Contrast Given 09/10/17 2031)  levofloxacin (LEVAQUIN) IVPB 750 mg (0 mg Intravenous Stopped 09/10/17 2143)  HYDROmorphone (DILAUDID) injection 1 mg (1 mg Intravenous Given 09/10/17 2009)  LORazepam (ATIVAN) injection 1 mg (1 mg Intravenous Given 09/10/17 2010)  sodium chloride 0.9 % bolus 1,000 mL (0 mLs Intravenous Stopped 09/10/17 2159)     Initial Impression / Assessment and Plan / ED Course  I have reviewed the triage vital signs and the nursing notes.  Pertinent labs & imaging results that were available during my care of the patient were reviewed by me and considered in my medical decision making (see chart for details).     32 year old male with  generalized fatigue and severe pain in multiple joints. Long-standing history of IV drug abuse.   He likely has endocarditis with septic arthritis in multiple joints. Rheumatologic condition possibility, but probably a lot less likely.   Will obtain labs including blood cultures and inflammatory markers.  Vancomycin should cover but, until diagnosis is confirmed, will broaden coverage. Amoxicillin allergy.   Currently he appears to be neurologically intact but does endorse a headache and, with reported new onset seizure, will also CT with contrast to evaluate for possible septic emboli.  Does have lower back pain. Hard to get reliable neuro exam in LE because he is so limited by pain. At this point, I do not think he'd be able to tolerate MRI because he is so uncomfortable. Will defer decision to image spine to admitting providers. Thrombocytopenia noted. In setting of sepsis, consider DIC. Needs to be followed.   Final Clinical Impressions(s) / ED Diagnoses   Final diagnoses:  Acute bacterial endocarditis  Thrombocytopenia [facility])    ED Discharge Orders    None       Virgel Manifold, MD 09/10/17 2221    Virgel Manifold, MD 10/02/17 1011

## 2017-09-10 NOTE — ED Notes (Signed)
Spoke with Dr. Stana Bunting about patients NPO status. Agreed he could have fluids

## 2017-09-10 NOTE — ED Triage Notes (Signed)
The pt is here c/o bi-lateral leg pain and body aches since he had a seizure jan 30th  His leg pain has been worse for the past 2 weeks

## 2017-09-10 NOTE — ED Notes (Signed)
Dr. Wilson Singer aware of lactic acid results.

## 2017-09-10 NOTE — H&P (Signed)
History and Physical   Gary Hicks HKV:[id number] DOB: 01/14/1986 DOA: 09/10/2017  PCP: Patient, [name] Per  Chief Complaint: generalized pain  HPI:  This is a 32 year old man with active IV drug abuse, who presents with generalized pain. History is limited secondary to his acute illness including pain and fluctuations in alertness, his father is present at the bedside assists with history. The patient reports that the pain has been happening since the end of January, father reports that 2-3 days ago he was concerned the patient may have had a seizure because he was found unresponsive with some abnormal eye movements in the fetal position.  At the time, EMS was notified and evaluated the patient, provide time EMS arrived the patient was coherent enough to refuse to be evaluated in the emergency department.  The patient was incarcerated and released 3-4 weeks ago. Patient admits using IV methamphetamine as well as heroin in the past 24 hours.  Reports global polyarthralgias including his bilateral feet bilateral knees bilateral hips and shoulders. Also reports fevers and chills generalized myalgias. Acutely worsened today for what she calls father who brought him to seek medical attention.  Of note, patient reports penicillin allergy, he cannot recall the reaction.  ED Course: Vitals remarkable for tachycardia to 150, increased history rate to 30, systolic blood pressure over 100, maintaining oxygen saturation over 90% on room air.  CT of the head without contrast did not reveal any acute findings.  CBC remarkable for from cytopenia 49,000, white count 11.5. BMP with a creatinine 1.4, albumin of 33. Lactic acid elevated 2.2 on initial check, down trended/normalized to 1.89. EKG reveals sinus tachycardia. Hepatic function panel T bili 1.2, AST 45, L2 42, alkaline phosphatase 146. Patient was treated with aztreonam, vancomycin, Levaquin, lorazepam IV, hydromorphone, 2 L NS.  Hospital medicine consulted  for admission.  Review of Systems: A complete ROS was obtained; pertinent positives negatives are denoted in the HPI. Otherwise, all systems are negative.   Past Medical History:  Diagnosis Date  . Bacterial endocarditis   . Heroin abuse ([place])   . Liver disease    possible Hep. C no treatment or confirmation   . Opiate abuse, continuous ([place])   . Polysubstance abuse ([place])   . Seizures ([place])    Social History   Socioeconomic History  . Marital status: Married    Spouse name: Not on file  . Number of children: Not on file  . Years of education: Not on file  . Highest education level: Not on file  Social Needs  . Financial resource strain: Not on file  . Food insecurity - worry: Not on file  . Food insecurity - inability: Not on file  . Transportation needs - medical: Not on file  . Transportation needs - non-medical: Not on file  Occupational History  . Not on file  Tobacco Use  . Smoking status: Current Every [place] Smoker    Packs/Mcarthy: 0.50    Types: Cigarettes  . Smokeless tobacco: Current User  Substance and Sexual Activity  . Alcohol use: No  . Drug use: Yes    Types: IV    Comment: heroin  . Sexual activity: Yes  Other Topics Concern  . Not on file  Social History Narrative  . Not on file   No family history on file.  Physical Exam: Vitals:   09/10/17 1930 09/10/17 2015 09/10/17 2030 09/10/17 2045  BP: 110/81 131/76 110/72 (!) 121/58  Pulse: (!) 137 (!) 140 Marland Kitchen)  143 (!) 140  Resp: 14 (!) 23 16 19   Temp:      TempSrc:      SpO2: 98% 100% 100% 100%  Weight:      Height:       General: Young white man, acutely ill appearing, uncomfortable due to pain ENT: Grossly normal hearing, no scleral icterus Cardiovascular Tachycardic to 150 bpm. No M/R/G. No LE edema.  Respiratory: CTA bilaterally. No wheezes or crackles in anterior lung fields. Normal respiratory effort. Abdomen: Soft, non-tender. Skin: Petechiae noted bilateral feet, no splinter hemorrhages seen  in nailbeds, innumerable needle tracks of bilateral upper extremities Musculoskeletal: reduced mobility 2/2 to acute pain, bilateral hands with erythema of PIP and DIPs, bilateral knees with erythema - more so on left, mild effusion, bilateral feet with erythema; patient reports tenderness to palpation of bilateral feet, knees, hips Psychiatric: flat affect Neurologic: alertness waxes and wanes, at one moment answers questions appropriately, at another cannot sustain attention, not oriented to current hospital  I have personally reviewed the following labs, culture data, and imaging studies.  Assessment/Plan:  #Severe sepsis Severe on the basis of elevated lactic acid with tachycardia and increased RR. This young man with IVDA presents with generalized pain, found to have evidence of infection in multiple sites (bilateral knees, feet, hands, likely bloodstream).  Unlikely that primary sole septic joint is the etiology of current illness, but suspect bacterial entry into bloodstream seeding multiple joints. End organ damage includes thrombocytopenia (49,000), AKI (Cr of 1.4); lactic acid elevated to 2.82 on initially, now has down-trended to normal. PLAN: -will continue with broad spectrum empiric antimicrobial coverage with vancomycin, aztreonam, fluoroquinolone -IVF support with LR 150 cc x 10 hours and re-assess -follow up blood culture results -XR L knee -TTE to evaluate for endocarditis  #Other problems -Thrombocytopenia: likely 2/2 to sepsis, will procure DIC panel to evaluate for possible DIC, particularly given petechiae on bilateral lower extremities -AKI: hydrating as above, strict I and O, monitor response -IVDA; will need substance abuse counseling, provide as needed opioids for breakthrough pain, bowel regimen to prevent opioid induced constipation  DVT prophylaxis: SCDs given thrombocytopenia Code Status: Full code Disposition Plan: TBD Consults called: none, consider  infectious disease Admission status: admit to step down unit, hospital medicine team   Cheri Rous, MD [facility] [phone]  If 7PM-7AM, please contact night-coverage [email or web address] Password [email or web address]

## 2017-09-10 NOTE — ED Notes (Signed)
Recollect 2nd blood culture by edp Kohut.

## 2017-09-10 NOTE — Progress Notes (Signed)
Pharmacy Antibiotic Note Grier Vu Wenk is a 32 y.o. male admitted on 09/10/2017 with sepsis.  Pharmacy has been consulted for vancomycin dosing.  Plan: 1. Vancomycin 750 mg IV every 12 hours  2. Azactam per MD 3. Would consider challenging with cephalosporin in the absence of anaphylactic reaction to PCN  Height: 5\' 9"  (175.3 cm) Weight: 140 lb (63.5 kg) IBW/kg (Calculated) : 70.7  Temp (24hrs), Avg:97.8 F (36.6 C), Min:97.8 F (36.6 C), Max:97.8 F (36.6 C)  Recent Labs  Lab 09/10/17 1811 09/10/17 1819 09/10/17 2005  WBC 11.5*  --   --   CREATININE 1.40*  --   --   LATICACIDVEN  --  2.82* 1.89    Estimated Creatinine Clearance: 68.7 mL/min (A) (by C-G formula based on SCr of 1.4 mg/dL (H)).    Allergies  Allergen Reactions  . Prednisone Hives  . Amoxicillin Hives    Antimicrobials this admission: 2/9 azactam >>  2/9 vancomycin >>   Microbiology results: 2/19 BCx: px  Thank you for allowing pharmacy to be a part of this patient's care.  Vincenza Hews, PharmD, BCPS 09/10/2017, 9:59 PM

## 2017-09-10 NOTE — ED Notes (Signed)
Pt accompanied by father.  Pt has had several seizures in the past several weeks, EMS has been at pts house, pt refused transport.  Pt now c/o difficulty walking and right arm pain.  Pt had to be assisted from w/c to bed, father assisted with putting pts legs up on bed.  Pt reports he has been taking Ibu and Tylenol with little relief.

## 2017-09-10 NOTE — ED Notes (Signed)
Patient transported to CT scan . 

## 2017-09-11 ENCOUNTER — Encounter (HOSPITAL_COMMUNITY): Payer: Self-pay | Admitting: Emergency Medicine

## 2017-09-11 ENCOUNTER — Inpatient Hospital Stay (HOSPITAL_COMMUNITY): Payer: Self-pay

## 2017-09-11 ENCOUNTER — Inpatient Hospital Stay (HOSPITAL_COMMUNITY)
Admission: EM | Admit: 2017-09-11 | Discharge: 2017-11-05 | DRG: 853 | Disposition: A | Discharge status: Home / Self Care | Payer: Self-pay | Attending: Internal Medicine | Admitting: Internal Medicine

## 2017-09-11 ENCOUNTER — Other Ambulatory Visit: Payer: Self-pay

## 2017-09-11 DIAGNOSIS — M71162 Other infective bursitis, left knee: Secondary | ICD-10-CM

## 2017-09-11 DIAGNOSIS — I368 Other nonrheumatic tricuspid valve disorders: Secondary | ICD-10-CM

## 2017-09-11 DIAGNOSIS — E46 Unspecified protein-calorie malnutrition: Secondary | ICD-10-CM | POA: Diagnosis not present

## 2017-09-11 DIAGNOSIS — R569 Unspecified convulsions: Secondary | ICD-10-CM | POA: Diagnosis present

## 2017-09-11 DIAGNOSIS — D649 Anemia, unspecified: Secondary | ICD-10-CM | POA: Diagnosis present

## 2017-09-11 DIAGNOSIS — Z888 Allergy status to other drugs, medicaments and biological substances status: Secondary | ICD-10-CM

## 2017-09-11 DIAGNOSIS — Z881 Allergy status to other antibiotic agents status: Secondary | ICD-10-CM

## 2017-09-11 DIAGNOSIS — M0009 Staphylococcal polyarthritis: Secondary | ICD-10-CM | POA: Diagnosis present

## 2017-09-11 DIAGNOSIS — J9811 Atelectasis: Secondary | ICD-10-CM | POA: Diagnosis not present

## 2017-09-11 DIAGNOSIS — B9561 Methicillin susceptible Staphylococcus aureus infection as the cause of diseases classified elsewhere: Secondary | ICD-10-CM

## 2017-09-11 DIAGNOSIS — M00051 Staphylococcal arthritis, right hip: Secondary | ICD-10-CM | POA: Diagnosis present

## 2017-09-11 DIAGNOSIS — F419 Anxiety disorder, unspecified: Secondary | ICD-10-CM | POA: Diagnosis present

## 2017-09-11 DIAGNOSIS — F410 Panic disorder [episodic paroxysmal anxiety] without agoraphobia: Secondary | ICD-10-CM | POA: Diagnosis present

## 2017-09-11 DIAGNOSIS — R509 Fever, unspecified: Secondary | ICD-10-CM

## 2017-09-11 DIAGNOSIS — N179 Acute kidney failure, unspecified: Secondary | ICD-10-CM | POA: Diagnosis present

## 2017-09-11 DIAGNOSIS — Z880 Allergy status to penicillin: Secondary | ICD-10-CM

## 2017-09-11 DIAGNOSIS — M00052 Staphylococcal arthritis, left hip: Secondary | ICD-10-CM | POA: Diagnosis present

## 2017-09-11 DIAGNOSIS — F329 Major depressive disorder, single episode, unspecified: Secondary | ICD-10-CM | POA: Diagnosis present

## 2017-09-11 DIAGNOSIS — D65 Disseminated intravascular coagulation [defibrination syndrome]: Secondary | ICD-10-CM | POA: Diagnosis present

## 2017-09-11 DIAGNOSIS — B1920 Unspecified viral hepatitis C without hepatic coma: Secondary | ICD-10-CM | POA: Diagnosis present

## 2017-09-11 DIAGNOSIS — F1510 Other stimulant abuse, uncomplicated: Secondary | ICD-10-CM

## 2017-09-11 DIAGNOSIS — D696 Thrombocytopenia, unspecified: Secondary | ICD-10-CM | POA: Diagnosis present

## 2017-09-11 DIAGNOSIS — R21 Rash and other nonspecific skin eruption: Secondary | ICD-10-CM | POA: Diagnosis present

## 2017-09-11 DIAGNOSIS — J181 Lobar pneumonia, unspecified organism: Secondary | ICD-10-CM | POA: Diagnosis not present

## 2017-09-11 DIAGNOSIS — F1120 Opioid dependence, uncomplicated: Secondary | ICD-10-CM | POA: Diagnosis present

## 2017-09-11 DIAGNOSIS — I330 Acute and subacute infective endocarditis: Secondary | ICD-10-CM | POA: Diagnosis present

## 2017-09-11 DIAGNOSIS — M868X8 Other osteomyelitis, other site: Secondary | ICD-10-CM | POA: Diagnosis present

## 2017-09-11 DIAGNOSIS — R52 Pain, unspecified: Secondary | ICD-10-CM

## 2017-09-11 DIAGNOSIS — I079 Rheumatic tricuspid valve disease, unspecified: Secondary | ICD-10-CM

## 2017-09-11 DIAGNOSIS — L818 Other specified disorders of pigmentation: Secondary | ICD-10-CM

## 2017-09-11 DIAGNOSIS — B9689 Other specified bacterial agents as the cause of diseases classified elsewhere: Secondary | ICD-10-CM | POA: Diagnosis present

## 2017-09-11 DIAGNOSIS — Z6824 Body mass index (BMI) 24.0-24.9, adult: Secondary | ICD-10-CM

## 2017-09-11 DIAGNOSIS — E871 Hypo-osmolality and hyponatremia: Secondary | ICD-10-CM | POA: Diagnosis not present

## 2017-09-11 DIAGNOSIS — R7881 Bacteremia: Secondary | ICD-10-CM

## 2017-09-11 DIAGNOSIS — R197 Diarrhea, unspecified: Secondary | ICD-10-CM | POA: Diagnosis not present

## 2017-09-11 DIAGNOSIS — M25559 Pain in unspecified hip: Secondary | ICD-10-CM

## 2017-09-11 DIAGNOSIS — J189 Pneumonia, unspecified organism: Secondary | ICD-10-CM | POA: Diagnosis not present

## 2017-09-11 DIAGNOSIS — J90 Pleural effusion, not elsewhere classified: Secondary | ICD-10-CM | POA: Diagnosis present

## 2017-09-11 DIAGNOSIS — M60009 Infective myositis, unspecified site: Secondary | ICD-10-CM | POA: Diagnosis present

## 2017-09-11 DIAGNOSIS — F17210 Nicotine dependence, cigarettes, uncomplicated: Secondary | ICD-10-CM | POA: Diagnosis present

## 2017-09-11 DIAGNOSIS — E876 Hypokalemia: Secondary | ICD-10-CM | POA: Diagnosis present

## 2017-09-11 DIAGNOSIS — I071 Rheumatic tricuspid insufficiency: Secondary | ICD-10-CM | POA: Diagnosis present

## 2017-09-11 DIAGNOSIS — I76 Septic arterial embolism: Secondary | ICD-10-CM | POA: Diagnosis not present

## 2017-09-11 DIAGNOSIS — L02419 Cutaneous abscess of limb, unspecified: Secondary | ICD-10-CM

## 2017-09-11 DIAGNOSIS — M00011 Staphylococcal arthritis, right shoulder: Secondary | ICD-10-CM | POA: Diagnosis present

## 2017-09-11 DIAGNOSIS — Z5320 Procedure and treatment not carried out because of patient's decision for unspecified reasons: Secondary | ICD-10-CM | POA: Diagnosis present

## 2017-09-11 DIAGNOSIS — R079 Chest pain, unspecified: Secondary | ICD-10-CM

## 2017-09-11 DIAGNOSIS — E559 Vitamin D deficiency, unspecified: Secondary | ICD-10-CM | POA: Diagnosis present

## 2017-09-11 DIAGNOSIS — K5900 Constipation, unspecified: Secondary | ICD-10-CM | POA: Diagnosis not present

## 2017-09-11 DIAGNOSIS — F1910 Other psychoactive substance abuse, uncomplicated: Secondary | ICD-10-CM | POA: Diagnosis present

## 2017-09-11 DIAGNOSIS — G5622 Lesion of ulnar nerve, left upper limb: Secondary | ICD-10-CM | POA: Diagnosis not present

## 2017-09-11 DIAGNOSIS — F1121 Opioid dependence, in remission: Secondary | ICD-10-CM | POA: Diagnosis present

## 2017-09-11 DIAGNOSIS — R6520 Severe sepsis without septic shock: Secondary | ICD-10-CM | POA: Diagnosis present

## 2017-09-11 DIAGNOSIS — F411 Generalized anxiety disorder: Secondary | ICD-10-CM | POA: Diagnosis present

## 2017-09-11 DIAGNOSIS — Z791 Long term (current) use of non-steroidal anti-inflammatories (NSAID): Secondary | ICD-10-CM

## 2017-09-11 DIAGNOSIS — Y95 Nosocomial condition: Secondary | ICD-10-CM | POA: Diagnosis not present

## 2017-09-11 DIAGNOSIS — F1110 Opioid abuse, uncomplicated: Secondary | ICD-10-CM | POA: Diagnosis present

## 2017-09-11 DIAGNOSIS — R000 Tachycardia, unspecified: Secondary | ICD-10-CM

## 2017-09-11 DIAGNOSIS — M7052 Other bursitis of knee, left knee: Secondary | ICD-10-CM | POA: Diagnosis present

## 2017-09-11 DIAGNOSIS — Z8249 Family history of ischemic heart disease and other diseases of the circulatory system: Secondary | ICD-10-CM

## 2017-09-11 DIAGNOSIS — D509 Iron deficiency anemia, unspecified: Secondary | ICD-10-CM | POA: Diagnosis present

## 2017-09-11 DIAGNOSIS — R51 Headache: Secondary | ICD-10-CM | POA: Diagnosis not present

## 2017-09-11 DIAGNOSIS — Z79899 Other long term (current) drug therapy: Secondary | ICD-10-CM

## 2017-09-11 DIAGNOSIS — Z9049 Acquired absence of other specified parts of digestive tract: Secondary | ICD-10-CM

## 2017-09-11 DIAGNOSIS — R6889 Other general symptoms and signs: Secondary | ICD-10-CM | POA: Diagnosis not present

## 2017-09-11 DIAGNOSIS — A419 Sepsis, unspecified organism: Principal | ICD-10-CM | POA: Diagnosis present

## 2017-09-11 DIAGNOSIS — I82491 Acute embolism and thrombosis of other specified deep vein of right lower extremity: Secondary | ICD-10-CM | POA: Diagnosis not present

## 2017-09-11 DIAGNOSIS — M009 Pyogenic arthritis, unspecified: Secondary | ICD-10-CM

## 2017-09-11 DIAGNOSIS — I8001 Phlebitis and thrombophlebitis of superficial vessels of right lower extremity: Secondary | ICD-10-CM | POA: Diagnosis present

## 2017-09-11 HISTORY — DX: Bacteremia: R78.81

## 2017-09-11 HISTORY — DX: Methicillin susceptible Staphylococcus aureus infection as the cause of diseases classified elsewhere: B95.61

## 2017-09-11 LAB — COMPREHENSIVE METABOLIC PANEL
ALBUMIN: 2.1 g/dL — AB (ref 3.5–5.0)
ALK PHOS: 76 U/L (ref 38–126)
ALT: 34 U/L (ref 17–63)
ALT: 28 U/L (ref 17–63)
ANION GAP: 11 (ref 5–15)
AST: 36 U/L (ref 15–41)
AST: 37 U/L (ref 15–41)
Albumin: 1.9 g/dL — ABNORMAL LOW (ref 3.5–5.0)
Alkaline Phosphatase: 83 U/L (ref 38–126)
Anion gap: 12 (ref 5–15)
BILIRUBIN TOTAL: 1 mg/dL (ref 0.3–1.2)
BUN: 16 mg/dL (ref 6–20)
BUN: 24 mg/dL — AB (ref 6–20)
CALCIUM: 7.7 mg/dL — AB (ref 8.9–10.3)
CHLORIDE: 96 mmol/L — AB (ref 101–111)
CO2: 22 mmol/L (ref 22–32)
CO2: 23 mmol/L (ref 22–32)
Calcium: 7.5 mg/dL — ABNORMAL LOW (ref 8.9–10.3)
Chloride: 96 mmol/L — ABNORMAL LOW (ref 101–111)
Creatinine, Ser: 1.25 mg/dL — ABNORMAL HIGH (ref 0.61–1.24)
Creatinine, Ser: 1.09 mg/dL (ref 0.61–1.24)
GFR calc Af Amer: >60 mL/min (ref >60)
GFR calc non Af Amer: >60 mL/min (ref >60)
GLUCOSE: 129 mg/dL — AB (ref 65–99)
Glucose, Bld: 110 mg/dL — ABNORMAL HIGH (ref 65–99)
POTASSIUM: 2.9 mmol/L — AB (ref 3.5–5.1)
Potassium: 3.4 mmol/L — ABNORMAL LOW (ref 3.5–5.1)
Sodium: 130 mmol/L — ABNORMAL LOW (ref 135–145)
Sodium: 130 mmol/L — ABNORMAL LOW (ref 135–145)
TOTAL PROTEIN: 5.7 g/dL — AB (ref 6.5–8.1)
Total Bilirubin: 0.9 mg/dL (ref 0.3–1.2)
Total Protein: 5.5 g/dL — ABNORMAL LOW (ref 6.5–8.1)

## 2017-09-11 LAB — CBC WITH DIFFERENTIAL/PLATELET
Basophils Absolute: 0 10*3/uL (ref 0.0–0.1)
Basophils Relative: 0 %
EOS ABS: 0 10*3/uL (ref 0.0–0.7)
Eosinophils Relative: 0 %
HCT: 26.9 % — ABNORMAL LOW (ref 39.0–52.0)
HEMOGLOBIN: 9.1 g/dL — AB (ref 13.0–17.0)
LYMPHS PCT: 8 %
Lymphs Abs: 1.3 10*3/uL (ref 0.7–4.0)
MCH: 31.2 pg (ref 26.0–34.0)
MCHC: 33.8 g/dL (ref 30.0–36.0)
MCV: 92.1 fL (ref 78.0–100.0)
Monocytes Absolute: 2.1 10*3/uL — ABNORMAL HIGH (ref 0.1–1.0)
Monocytes Relative: 13 %
NEUTROS PCT: 79 %
Neutro Abs: 12.7 10*3/uL — ABNORMAL HIGH (ref 1.7–7.7)
Platelets: 73 10*3/uL — ABNORMAL LOW (ref 150–400)
RBC: 2.92 MIL/uL — ABNORMAL LOW (ref 4.22–5.81)
RDW: 13.7 % (ref 11.5–15.5)
WBC: 16.1 10*3/uL — ABNORMAL HIGH (ref 4.0–10.5)

## 2017-09-11 LAB — CBC
HEMATOCRIT: 34.7 % — AB (ref 39.0–52.0)
Hemoglobin: 12.2 g/dL — ABNORMAL LOW (ref 13.0–17.0)
MCH: 32.4 pg (ref 26.0–34.0)
MCHC: 35.2 g/dL (ref 30.0–36.0)
MCV: 92.3 fL (ref 78.0–100.0)
Platelets: 54 10*3/uL — ABNORMAL LOW (ref 150–400)
RBC: 3.76 MIL/uL — ABNORMAL LOW (ref 4.22–5.81)
RDW: 13.6 % (ref 11.5–15.5)
WBC: 9.9 10*3/uL (ref 4.0–10.5)

## 2017-09-11 LAB — BLOOD CULTURE ID PANEL (REFLEXED)
Acinetobacter baumannii: NOT DETECTED
Candida albicans: NOT DETECTED
Candida glabrata: NOT DETECTED
Candida krusei: NOT DETECTED
Candida parapsilosis: NOT DETECTED
Candida tropicalis: NOT DETECTED
Carbapenem resistance: NOT DETECTED
Enterobacter cloacae complex: NOT DETECTED
Enterobacteriaceae species: NOT DETECTED
Enterococcus species: NOT DETECTED
Escherichia coli: NOT DETECTED
Haemophilus influenzae: NOT DETECTED
Klebsiella oxytoca: NOT DETECTED
Klebsiella pneumoniae: NOT DETECTED
Listeria monocytogenes: NOT DETECTED
Methicillin resistance: NOT DETECTED
Neisseria meningitidis: NOT DETECTED
Proteus species: NOT DETECTED
Pseudomonas aeruginosa: NOT DETECTED
Serratia marcescens: NOT DETECTED
Staphylococcus aureus (BCID): DETECTED — AB
Staphylococcus species: DETECTED — AB
Streptococcus agalactiae: NOT DETECTED
Streptococcus pneumoniae: NOT DETECTED
Streptococcus pyogenes: NOT DETECTED
Streptococcus species: NOT DETECTED
Vancomycin resistance: NOT DETECTED

## 2017-09-11 LAB — I-STAT CG4 LACTIC ACID, ED: LACTIC ACID, VENOUS: 2.72 mmol/L — AB (ref 0.5–1.9)

## 2017-09-11 LAB — DIC (DISSEMINATED INTRAVASCULAR COAGULATION) PANEL
D DIMER QUANT: 5.46 ug{FEU}/mL — AB (ref 0.00–0.50)
PLATELETS: 54 10*3/uL — AB (ref 150–400)

## 2017-09-11 LAB — BLOOD GAS, ARTERIAL
ACID-BASE EXCESS: 3.2 mmol/L — AB (ref 0.0–2.0)
BICARBONATE: 26.2 mmol/L (ref 20.0–28.0)
Drawn by: 362771
O2 SAT: 97.1 %
PATIENT TEMPERATURE: 100.5
pCO2 arterial: 35.4 mmHg (ref 32.0–48.0)
pH, Arterial: 7.488 — ABNORMAL HIGH (ref 7.350–7.450)
pO2, Arterial: 86.4 mmHg (ref 83.0–108.0)

## 2017-09-11 LAB — RAPID URINE DRUG SCREEN, HOSP PERFORMED
Amphetamines: NOT DETECTED
BARBITURATES: NOT DETECTED
BENZODIAZEPINES: NOT DETECTED
COCAINE: NOT DETECTED
Opiates: POSITIVE — AB
TETRAHYDROCANNABINOL: POSITIVE — AB

## 2017-09-11 LAB — CBG MONITORING, ED: GLUCOSE-CAPILLARY: 97 mg/dL (ref 65–99)

## 2017-09-11 LAB — MAGNESIUM: Magnesium: 1.7 mg/dL (ref 1.7–2.4)

## 2017-09-11 LAB — PROTIME-INR
INR: 1.3
PROTHROMBIN TIME: 16.1 s — AB (ref 11.4–15.2)

## 2017-09-11 LAB — DIC (DISSEMINATED INTRAVASCULAR COAGULATION)PANEL
Fibrinogen: 560 mg/dL — ABNORMAL HIGH (ref 210–475)
INR: 1.18
Prothrombin Time: 14.9 seconds (ref 11.4–15.2)
Smear Review: NONE SEEN
aPTT: 41 seconds — ABNORMAL HIGH (ref 24–36)

## 2017-09-11 LAB — LACTIC ACID, PLASMA: Lactic Acid, Venous: 2.8 mmol/L (ref 0.5–1.9)

## 2017-09-11 LAB — I-STAT TROPONIN, ED: Troponin i, poc: 0 ng/mL (ref 0.00–0.08)

## 2017-09-11 LAB — HIV ANTIBODY (ROUTINE TESTING W REFLEX): HIV Screen 4th Generation wRfx: NONREACTIVE

## 2017-09-11 LAB — LACTATE DEHYDROGENASE: LDH: 262 U/L — ABNORMAL HIGH (ref 98–192)

## 2017-09-11 MED ORDER — DICYCLOMINE HCL 20 MG PO TABS: 20.0000 mg | ORAL_TABLET | Freq: Four times a day (QID) | ORAL | Status: DC | PRN

## 2017-09-11 MED ORDER — POTASSIUM CHLORIDE CRYS ER 20 MEQ PO TBCR
40.0000 meq | EXTENDED_RELEASE_TABLET | Freq: Once | ORAL | Status: AC
  Administered 2017-09-11: 40 meq via ORAL
  Filled 2017-09-11: qty 2

## 2017-09-11 MED ORDER — LACTATED RINGERS IV SOLN
INTRAVENOUS | Status: DC
  Administered 2017-09-11: via INTRAVENOUS

## 2017-09-11 MED ORDER — METHOCARBAMOL 500 MG PO TABS
500.0000 mg | ORAL_TABLET | Freq: Three times a day (TID) | ORAL | Status: DC | PRN
  Administered 2017-09-11: 500 mg via ORAL
  Filled 2017-09-11: qty 1

## 2017-09-11 MED ORDER — LEVOFLOXACIN IN D5W 750 MG/150ML IV SOLN: 750.0000 mg | INTRAVENOUS | Status: DC

## 2017-09-11 MED ORDER — VANCOMYCIN HCL IN DEXTROSE 750-5 MG/150ML-% IV SOLN
750.0000 mg | Freq: Three times a day (TID) | INTRAVENOUS | Status: DC
  Administered 2017-09-11 – 2017-09-12 (×2): 750 mg via INTRAVENOUS
  Filled 2017-09-11 (×3): qty 150

## 2017-09-11 MED ORDER — NICOTINE 14 MG/24HR TD PT24
14.0000 mg | MEDICATED_PATCH | Freq: Every day | TRANSDERMAL | Status: DC
  Administered 2017-09-12 – 2017-10-15 (×9): 14 mg via TRANSDERMAL
  Filled 2017-09-11 (×32): qty 1

## 2017-09-11 MED ORDER — IBUPROFEN 100 MG PO CHEW: 200.0000 mg | CHEWABLE_TABLET | Freq: Three times a day (TID) | ORAL | Status: DC | PRN

## 2017-09-11 MED ORDER — ONDANSETRON HCL 4 MG/2ML IJ SOLN
4.0000 mg | Freq: Four times a day (QID) | INTRAMUSCULAR | Status: DC | PRN
  Administered 2017-09-14 – 2017-10-26 (×21): 4 mg via INTRAVENOUS
  Filled 2017-09-11 (×21): qty 2

## 2017-09-11 MED ORDER — ACETAMINOPHEN 325 MG PO TABS
650.0000 mg | ORAL_TABLET | Freq: Four times a day (QID) | ORAL | Status: DC | PRN
  Administered 2017-09-11 – 2017-09-14 (×5): 650 mg via ORAL
  Filled 2017-09-11 (×4): qty 2

## 2017-09-11 MED ORDER — IBUPROFEN 200 MG PO TABS
200.0000 mg | ORAL_TABLET | Freq: Once | ORAL | Status: AC | PRN
  Administered 2017-09-12: 200 mg via ORAL
  Filled 2017-09-11: qty 1

## 2017-09-11 MED ORDER — LORAZEPAM 2 MG/ML IJ SOLN
1.0000 mg | Freq: Once | INTRAMUSCULAR | Status: AC
  Administered 2017-09-11: 1 mg via INTRAVENOUS
  Filled 2017-09-11: qty 1

## 2017-09-11 MED ORDER — ONDANSETRON HCL 4 MG PO TABS
4.0000 mg | ORAL_TABLET | Freq: Four times a day (QID) | ORAL | Status: DC | PRN
  Administered 2017-10-17: 4 mg via ORAL
  Filled 2017-09-11 (×2): qty 1

## 2017-09-11 MED ORDER — WHITE PETROLATUM EX OINT
TOPICAL_OINTMENT | CUTANEOUS | Status: AC
  Filled 2017-09-11: qty 28.35

## 2017-09-11 MED ORDER — WHITE PETROLATUM EX OINT
TOPICAL_OINTMENT | CUTANEOUS | Status: AC
  Administered 2017-09-12: 01:00:00
  Filled 2017-09-11: qty 28.35

## 2017-09-11 MED ORDER — NAPROXEN 250 MG PO TABS: 500.0000 mg | ORAL_TABLET | Freq: Two times a day (BID) | ORAL | Status: DC | PRN

## 2017-09-11 MED ORDER — SODIUM CHLORIDE 0.9% FLUSH
3.0000 mL | Freq: Two times a day (BID) | INTRAVENOUS | Status: DC
  Administered 2017-09-11: 3 mL via INTRAVENOUS

## 2017-09-11 MED ORDER — SODIUM CHLORIDE 0.9 % IV SOLN: INTRAVENOUS | Status: DC

## 2017-09-11 MED ORDER — POLYETHYLENE GLYCOL 3350 17 G PO PACK: 17.0000 g | PACK | Freq: Every day | ORAL | Status: DC | PRN

## 2017-09-11 MED ORDER — CLONIDINE HCL 0.2 MG PO TABS: 0.1000 mg | ORAL_TABLET | Freq: Every day | ORAL | Status: DC

## 2017-09-11 MED ORDER — ONDANSETRON 4 MG PO TBDP: 4.0000 mg | ORAL_TABLET | Freq: Four times a day (QID) | ORAL | Status: DC | PRN

## 2017-09-11 MED ORDER — POLYETHYLENE GLYCOL 3350 17 G PO PACK
17.0000 g | PACK | Freq: Every day | ORAL | Status: DC
  Administered 2017-09-11: 17 g via ORAL
  Filled 2017-09-11: qty 1

## 2017-09-11 MED ORDER — LOPERAMIDE HCL 2 MG PO CAPS: 2.0000 mg | ORAL_CAPSULE | ORAL | Status: DC | PRN

## 2017-09-11 MED ORDER — ACETAMINOPHEN 650 MG RE SUPP
650.0000 mg | Freq: Once | RECTAL | Status: DC
  Filled 2017-09-11: qty 1

## 2017-09-11 MED ORDER — CLONIDINE HCL 0.2 MG PO TABS
0.1000 mg | ORAL_TABLET | Freq: Four times a day (QID) | ORAL | Status: DC
  Administered 2017-09-11: 0.1 mg via ORAL
  Filled 2017-09-11: qty 1

## 2017-09-11 MED ORDER — POTASSIUM CHLORIDE CRYS ER 20 MEQ PO TBCR
40.0000 meq | EXTENDED_RELEASE_TABLET | Freq: Once | ORAL | Status: AC
Start: 2017-09-11 — End: 2017-09-11
  Administered 2017-09-11: 40 meq via ORAL
  Filled 2017-09-11: qty 2

## 2017-09-11 MED ORDER — HYDROXYZINE HCL 25 MG PO TABS
25.0000 mg | ORAL_TABLET | Freq: Four times a day (QID) | ORAL | Status: DC | PRN
  Administered 2017-09-11: 25 mg via ORAL
  Filled 2017-09-11: qty 1

## 2017-09-11 MED ORDER — CLONIDINE HCL 0.2 MG PO TABS: 0.1000 mg | ORAL_TABLET | ORAL | Status: DC

## 2017-09-11 MED ORDER — OXYCODONE HCL 5 MG PO TABS
5.0000 mg | ORAL_TABLET | ORAL | Status: DC | PRN
  Administered 2017-09-11 (×3): 10 mg via ORAL
  Filled 2017-09-11 (×3): qty 2

## 2017-09-11 MED ORDER — SODIUM CHLORIDE 0.9 % IV BOLUS (SEPSIS)
1000.0000 mL | Freq: Once | INTRAVENOUS | Status: AC
  Administered 2017-09-11: 1000 mL via INTRAVENOUS

## 2017-09-11 NOTE — ED Notes (Addendum)
Pts.IV WASOUT LYING ON THE FLOOR WHEN IN ROOM REPORTED TO NURSE.

## 2017-09-11 NOTE — ED Provider Notes (Signed)
[place] EMERGENCY DEPARTMENT Provider Note   CSN: [id number] Arrival date & time: 09/11/17  1649     History   Chief Complaint Chief Complaint  Patient presents with  . Weakness  . Leg Pain    HPI Gary Hicks is a 32 y.o. male.  HPI   Patient is a 32 year old male presenting with fever, altered mental status.  Patient is an IV drug user.  He was admitted yesterday for endocarditis and positive blood cultures.  Patient left AMA to do heroin today.  Patient returned 3 hours later he appears altered, small pupils, febrile to 103, tachycardic to 140.  Patient is acutely ill.  Patient returns with father.     Past Medical History:  Diagnosis Date  . Bacterial endocarditis   . Heroin abuse ([place])   . Liver disease    possible Hep. C no treatment or confirmation   . Opiate abuse, continuous ([place])   . Polysubstance abuse ([place])   . Seizures [facility])     Patient Active Problem List   Diagnosis Date Noted  . Bacteremia due to methicillin susceptible Staphylococcus aureus (MSSA) 09/11/2017  . Normocytic anemia 09/11/2017  . Thrombocytopenia ([place]) 09/11/2017  . Acute kidney injury ([place]) 09/11/2017  . Cigarette smoker 09/11/2017  . Severe sepsis ([place]) 09/10/2017  . Heroin abuse ([place]) 01/05/2014  . Polysubstance abuse ([place]) 01/05/2014    Past Surgical History:  Procedure Laterality Date  . CHOLECYSTECTOMY    . HAND SURGERY         Home Medications    Prior to Admission medications   Medication Sig Start Date End Date Taking? Authorizing Provider  acetaminophen (TYLENOL) 500 MG tablet Take 1,000 mg by mouth every 6 (six) hours as needed for headache (pain).   Yes [provider]  ibuprofen (ADVIL,MOTRIN) 200 MG tablet Take 400 mg by mouth every 6 (six) hours as needed for headache (pain).   Yes [provider]  benzonatate (TESSALON) 100 MG capsule Take 1 capsule (100 mg total) by mouth every 8 (eight) hours. Patient not taking:  Reported on 09/10/2017 01/05/15   Dahlia Bailiff, PA-C  cyclobenzaprine (FLEXERIL) 10 MG tablet Take 1 tablet (10 mg total) by mouth 2 (two) times daily as needed for muscle spasms. Patient not taking: Reported on 09/10/2017 07/06/16   Gareth Morgan, MD  HYDROcodone-acetaminophen (NORCO/VICODIN) 5-325 MG per tablet Take 1 tablet by mouth every 6 (six) hours as needed for moderate pain. Patient not taking: Reported on 09/10/2017 05/29/14   Junius Creamer, NP  ibuprofen (ADVIL,MOTRIN) 800 MG tablet Take 1 tablet (800 mg total) by mouth 3 (three) times daily. Patient not taking: Reported on 09/10/2017 01/05/15   Dahlia Bailiff, PA-C    Family History History reviewed. No pertinent family history.  Social History Social History   Tobacco Use  . Smoking status: Current Every [place] Smoker    Packs/Doody: 0.50    Types: Cigarettes  . Smokeless tobacco: Current User  Substance Use Topics  . Alcohol use: No  . Drug use: Yes    Types: IV    Comment: heroin     Allergies   Prednisone and Amoxicillin   Review of Systems Review of Systems  Unable to perform ROS: Acuity of condition     Physical Exam Updated Vital Signs BP 129/72   Pulse (!) 137   Temp (!) 100.4 F (38 C) (Oral)   Resp 17   SpO2 99%   Physical Exam  Constitutional: He  appears well-developed and well-nourished.  HENT:  Head: Normocephalic and atraumatic.  Pupils pinpoint.  Eyes: Conjunctivae are normal.  Neck: Neck supple.  Cardiovascular:  Murmur heard. Tachycardia, murmur.  Pulmonary/Chest: Effort normal and breath sounds normal.  Abdominal: Soft. There is no tenderness.  Musculoskeletal: He exhibits no deformity.  Neurological:  Patient clearly on heroin, intermittently sleepy, protecting airway.  Skin: Skin is warm and dry.  Erythematous vascular lesions to bilateral hands and feet.  Nursing note and vitals reviewed.    ED Treatments / Results  Labs (all labs ordered are listed, but only abnormal results are  displayed) Labs Reviewed  CULTURE, BLOOD (ROUTINE X 2)  CULTURE, BLOOD (ROUTINE X 2)  COMPREHENSIVE METABOLIC PANEL  CBC WITH DIFFERENTIAL/PLATELET  PROTIME-INR  I-STAT CG4 LACTIC ACID, ED  I-STAT TROPONIN, ED    EKG  EKG Interpretation None       Radiology Ct Head W Or Wo Contrast  Result Date: 09/10/2017 CLINICAL DATA:  Endocarditis.  Infection suspected. EXAM: CT HEAD WITHOUT AND WITH CONTRAST TECHNIQUE: Contiguous axial images were obtained from the base of the skull through the vertex without and with intravenous contrast CONTRAST:  70mL ISOVUE-300 IOPAMIDOL (ISOVUE-300) INJECTION 61% COMPARISON:  None. FINDINGS: Brain: No acute infarct, hemorrhage, or mass lesion is present. The ventricles are of normal size. No significant extraaxial fluid collection is present. Postcontrast images demonstrate no pathologic enhancement. A mega cisterna magna is noted. Cerebellum is within normal limits. Brainstem is unremarkable. Vascular: No significant vascular lesions are present. Skull: Calvarium is intact. No focal lytic or blastic lesions are present. Sinuses/Orbits: The paranasal sinuses and mastoid air cells are clear. Globes and orbits are within normal limits. IMPRESSION: Negative CT of the head without and with contrast. No focal lesions or enhancement to suggest septic emboli. Electronically Signed   By: San Morelle M.D.   On: 09/10/2017 20:59   Dg Knee Left Port  Result Date: 09/11/2017 CLINICAL DATA:  Acute left knee pain following fall. Initial encounter. EXAM: PORTABLE LEFT KNEE - 1-2 VIEW COMPARISON:  None. FINDINGS: The patient was unwilling to have a lateral view performed. No evidence of fracture or dislocation. No evidence of arthropathy or other focal bone abnormality. Soft tissues are unremarkable. IMPRESSION: No significant abnormalities on three views. The patient was unwilling to have a lateral view performed and presence of is effusion cannot be assessed.  Electronically Signed   By: Margarette Canada M.D.   On: 09/11/2017 08:45    Procedures Procedures (including critical care time)  CRITICAL CARE Performed by: Gardiner Sleeper Total critical care time: 80 minutes Critical care time was exclusive of separately billable procedures and treating other patients. Critical care was necessary to treat or prevent imminent or life-threatening deterioration. Critical care was time spent personally by me on the following activities: development of treatment plan with patient and/or surrogate as well as nursing, discussions with consultants, evaluation of patient's response to treatment, examination of patient, obtaining history from patient or surrogate, ordering and performing treatments and interventions, ordering and review of laboratory studies, ordering and review of radiographic studies, pulse oximetry and re-evaluation of patient's condition.  Emergency Ultrasound:  US Guidance for needle guidance  Performed by Dr. Thomasene Lot Indication: IV access Linear probe used in real-time to visualize location of needle entry through skin. Interpretation: IV inserted into peripheral vein. Images not archived electronically.   Medications Ordered in ED Medications  sodium chloride 0.9 % bolus 1,000 mL (not administered)     Initial  Impression / Assessment and Plan / ED Course  I have reviewed the triage vital signs and the nursing notes.  Pertinent labs & imaging results that were available during my care of the patient were reviewed by me and considered in my medical decision making (see chart for details).    Patient is a 32 year old male presenting with fever, altered mental status.  Patient is an IV drug user.  He was admitted yesterday for endocarditis and positive blood cultures.  Patient left AMA to do heroin today.  Patient returned 3 hours later he appears altered, small pupils, febrile to 103, tachycardic to 140.  Patient is acutely ill.   Patient returns with father.   6:40 PM See H&P for further details but it appears the patient has positive BLOOD cultures, ID has been consulted.  Patient is due for has aztreonam 2 g at 6 PM.  Due for Levaquin at 8 PM and vancomycin at 10 PM.  Patient clearly did heroin, he has pinpoint pupils and altered mental status.  However he is protecting airway right now.  I do not want to insight rapid withdrawal because I want patient to stay in the hospital.  So we will not treat with Narcan at this time.  Will continually to close monitor.  Final Clinical Impressions(s) / ED Diagnoses   Final diagnoses:  None    ED Discharge Orders    None       Yesena Reaves, Fredia Sorrow, MD 09/11/17 2349

## 2017-09-11 NOTE — Progress Notes (Signed)
CSW was present during time that patient left AMA from [facility] ED, however no interaction with patient occurred.  CSW signing off.  Madilyn Fireman, MSW, LCSW-A Weekend Clinical Social Worker [phone]

## 2017-09-11 NOTE — ED Notes (Signed)
Now pt wants to leave because his visitor can not come back per security .admitting doctor.is her for him.he wants to leave ama.

## 2017-09-11 NOTE — ED Notes (Signed)
RN has placed monitoring equipment on patient several times and informed him of the importance. Pt still removing equipment

## 2017-09-11 NOTE — Progress Notes (Signed)
PROGRESS NOTE    Gary Hicks  PNT:[id number] DOB: 04-23-1986 DOA: 09/10/2017 PCP: Patient, [name] Per     Brief Narrative:  Gary Hicks is a 32 yo male with current IVDA who presents to the hospital due to generalized pain in his joints. Per record review, patient apparently had a seizure? A few days ago when his father found patient unresponsive with abnormal eye movements at home.  At that time, EMS was notified, and patient had refused to be evaluated in the emergency department.  Patient did admit to using IV methamphetamines as well as heroin in the past 24 hours.  He admits to global polyarthralgias in bilateral feet, bilateral knees, bilateral hips and shoulders.  He also reports fevers, chills.  In the emergency department, patient has been in sinus tachycardia.  There was concern for sepsis, patient was started on empiric aztreonam, vancomycin, Levaquin after blood cultures were obtained.  Assessment & Plan:   Principal Problem:   Bacteremia due to methicillin susceptible Staphylococcus aureus (MSSA) Active Problems:   Heroin abuse ([place])   Polysubstance abuse ([place])   Severe sepsis (H[facility])   Normocytic anemia   Thrombocytopenia ([facility])   Acute kidney injury ([place])   Cigarette smoker  Severe sepsis secondary to MSSA bacteremia -Echo -ID consulted -Vanco   Thrombocytopenia -Due to infectious etiology, monitor closely   Hypokalemia -Replace, trend   AKI -Baseline Cr 0.8-0.9 -IVF   IVDA -Admitted to methamphetamine, opiate IV use -SW consult  -COWS protocol    DVT prophylaxis: SCD Code Status: Full Family Communication: No family at bedside Disposition Plan: Pending improvement    Consultants:   ID   Procedures:   None   Antimicrobials:  Anti-infectives (From admission, onward)   Start     Dose/Rate Route Frequency Ordered Stop   09/11/17 2145  levofloxacin (LEVAQUIN) IVPB 750 mg  Status:  Discontinued     750 mg 100 mL/hr over 90 Minutes Intravenous Every  24 hours 09/11/17 0002 09/11/17 1051   09/11/17 1000  vancomycin (VANCOCIN) IVPB 750 mg/150 ml premix     750 mg 150 mL/hr over 60 Minutes Intravenous Every 12 hours 09/10/17 2153     09/10/17 2200  aztreonam (AZACTAM) 2 g in dextrose 5 % 50 mL IVPB  Status:  Discontinued     2 g 100 mL/hr over 30 Minutes Intravenous Every 8 hours 09/10/17 1948 09/11/17 1051   09/10/17 2000  levofloxacin (LEVAQUIN) IVPB 750 mg     750 mg 100 mL/hr over 90 Minutes Intravenous  Once 09/10/17 1948 09/10/17 2143   09/10/17 1945  vancomycin (VANCOCIN) 1,250 mg in sodium chloride 0.9 % 250 mL IVPB     1,250 mg 166.7 mL/hr over 90 Minutes Intravenous  Once 09/10/17 1918 09/10/17 2143        Subjective: Pain everywhere   Objective: Vitals:   09/11/17 1000 09/11/17 1100 09/11/17 1140 09/11/17 1145  BP: 139/88 124/83 124/83   Pulse: (!) 136   (!) 134  Resp: 17 19  16   Temp:      TempSrc:      SpO2: 97%   98%  Weight:      Height:        Intake/Output Summary (Last 24 hours) at 09/11/2017 1305 Last data filed at 09/11/2017 1152 Gross per 24 hour  Intake 2980 ml  Output 1700 ml  Net 1280 ml   Filed Weights   09/10/17 1654  Weight: 63.5 kg (140 lb)  Examination:  General exam: Appears calm, but uncomfortable  Respiratory system: Clear to auscultation. Respiratory effort normal. Cardiovascular system: S1 & S2 heard, tachycardic, regular rhythm. No JVD, murmurs, rubs, gallops or clicks. No pedal edema. Gastrointestinal system: Abdomen is nondistended, soft and nontender. No organomegaly or masses felt. Normal bowel sounds heard. Central nervous system: Alert and oriented. No focal neurological deficits. Extremities: +joint erythema bilateral feet, bilateral knees, bilateral hands  Psychiatry: Judgement and insight appear poor   Data Reviewed: I have personally reviewed following labs and imaging studies  CBC: Recent Labs  Lab 09/10/17 1811 09/11/17 0041  WBC 11.5* 9.9  HGB 14.2 12.2*    HCT 40.1 34.7*  MCV 92.2 92.3  PLT 49* 54*  54*   Basic Metabolic Panel: Recent Labs  Lab 09/10/17 1811 09/11/17 0041  NA 130* 130*  K 3.8 2.9*  CL 91* 96*  CO2 22 22  GLUCOSE 99 129*  BUN 33* 24*  CREATININE 1.40* 1.25*  CALCIUM 8.2* 7.5*   GFR: Estimated Creatinine Clearance: 76.9 mL/min (A) (by C-G formula based on SCr of 1.25 mg/dL (H)). Liver Function Tests: Recent Labs  Lab 09/10/17 1811 09/11/17 0041  AST 45* 37  ALT 42 34  ALKPHOS 148* 83  BILITOT 1.2 0.9  PROT 6.6 5.5*  ALBUMIN 2.6* 2.1*   No results for input(s): LIPASE, AMYLASE in the last 168 hours. No results for input(s): AMMONIA in the last 168 hours. Coagulation Profile: Recent Labs  Lab 09/11/17 0041  INR 1.18   Cardiac Enzymes: No results for input(s): CKTOTAL, CKMB, CKMBINDEX, TROPONINI in the last 168 hours. BNP (last 3 results) No results for input(s): PROBNP in the last 8760 hours. HbA1C: No results for input(s): HGBA1C in the last 72 hours. CBG: Recent Labs  Lab 09/11/17 1218  GLUCAP 97   Lipid Profile: No results for input(s): CHOL, HDL, LDLCALC, TRIG, CHOLHDL, LDLDIRECT in the last 72 hours. Thyroid Function Tests: No results for input(s): TSH, T4TOTAL, FREET4, T3FREE, THYROIDAB in the last 72 hours. Anemia Panel: No results for input(s): VITAMINB12, FOLATE, FERRITIN, TIBC, IRON, RETICCTPCT in the last 72 hours. Sepsis Labs: Recent Labs  Lab 09/10/17 1819 09/10/17 2005  LATICACIDVEN 2.82* 1.89    Recent Results (from the past 240 hour(s))  Blood culture (routine x 2)     Status: None (Preliminary result)   Collection Time: 09/10/17  7:45 PM  Result Value Ref Range Status   Specimen Description BLOOD RIGHT HAND  Final   Special Requests IN PEDIATRIC BOTTLE Blood Culture adequate volume  Final   Culture  Setup Time   Final    GRAM POSITIVE COCCI IN CLUSTERS IN PEDIATRIC BOTTLE CRITICAL RESULT CALLED TO, READ BACK BY AND VERIFIED WITH: T RUDISILL,PHARMD AT 3710  09/11/17 BY L BENFIELD Performed at [facility], [place] [address], [place], [place] [address]    Culture GRAM POSITIVE COCCI  Final   Report Status PENDING  Incomplete  Blood Culture ID Panel (Reflexed)     Status: Abnormal   Collection Time: 09/10/17  7:45 PM  Result Value Ref Range Status   Enterococcus species NOT DETECTED NOT DETECTED Final   Vancomycin resistance NOT DETECTED NOT DETECTED Final   Listeria monocytogenes NOT DETECTED NOT DETECTED Final   Staphylococcus species DETECTED (A) NOT DETECTED Final    Comment: CRITICAL RESULT CALLED TO, READ BACK BY AND VERIFIED WITH: T RUDISILL,PHARMD AT 8546 09/11/17 BY L BENFIELD    Staphylococcus aureus DETECTED (A) NOT DETECTED Final  Comment: CRITICAL RESULT CALLED TO, READ BACK BY AND VERIFIED WITH: T RUDISILL,PHARMD AT 3244 09/11/17 BY L BENFIELD Methicillin (oxacillin) susceptible Staphylococcus aureus (MSSA). Preferred therapy is anti staphylococcal beta lactam antibiotic (Cefazolin or Nafcillin), unless clinically contraindicated.    Methicillin resistance NOT DETECTED NOT DETECTED Final   Streptococcus species NOT DETECTED NOT DETECTED Final   Streptococcus agalactiae NOT DETECTED NOT DETECTED Final   Streptococcus pneumoniae NOT DETECTED NOT DETECTED Final   Streptococcus pyogenes NOT DETECTED NOT DETECTED Final   Acinetobacter baumannii NOT DETECTED NOT DETECTED Final   Enterobacteriaceae species NOT DETECTED NOT DETECTED Final   Enterobacter cloacae complex NOT DETECTED NOT DETECTED Final   Escherichia coli NOT DETECTED NOT DETECTED Final   Klebsiella oxytoca NOT DETECTED NOT DETECTED Final   Klebsiella pneumoniae NOT DETECTED NOT DETECTED Final   Proteus species NOT DETECTED NOT DETECTED Final   Serratia marcescens NOT DETECTED NOT DETECTED Final   Carbapenem resistance NOT DETECTED NOT DETECTED Final   Haemophilus influenzae NOT DETECTED NOT DETECTED Final   Neisseria meningitidis NOT DETECTED NOT DETECTED Final    Pseudomonas aeruginosa NOT DETECTED NOT DETECTED Final   Candida albicans NOT DETECTED NOT DETECTED Final   Candida glabrata NOT DETECTED NOT DETECTED Final   Candida krusei NOT DETECTED NOT DETECTED Final   Candida parapsilosis NOT DETECTED NOT DETECTED Final   Candida tropicalis NOT DETECTED NOT DETECTED Final    Comment: Performed at [facility], [place] [address], [place], [place] [address]  Blood culture (routine x 2)     Status: None (Preliminary result)   Collection Time: 09/10/17  9:50 PM  Result Value Ref Range Status   Specimen Description BLOOD LEFT ANTECUBITAL  Final   Special Requests IN PEDIATRIC BOTTLE Blood Culture adequate volume  Final   Culture  Setup Time   Final    GRAM POSITIVE COCCI IN PEDIATRIC BOTTLE CRITICAL VALUE NOTED.  VALUE IS CONSISTENT WITH PREVIOUSLY REPORTED AND CALLED VALUE. Performed at [facility], [place] [address], [place], [place] [address]    Culture [facility] POSITIVE COCCI  Final   Report Status PENDING  Incomplete       Radiology Studies: Ct Head W Or Wo Contrast  Result Date: 09/10/2017 CLINICAL DATA:  Endocarditis.  Infection suspected. EXAM: CT HEAD WITHOUT AND WITH CONTRAST TECHNIQUE: Contiguous axial images were obtained from the base of the skull through the vertex without and with intravenous contrast CONTRAST:  66mL ISOVUE-300 IOPAMIDOL (ISOVUE-300) INJECTION 61% COMPARISON:  None. FINDINGS: Brain: No acute infarct, hemorrhage, or mass lesion is present. The ventricles are of normal size. No significant extraaxial fluid collection is present. Postcontrast images demonstrate no pathologic enhancement. A mega cisterna magna is noted. Cerebellum is within normal limits. Brainstem is unremarkable. Vascular: No significant vascular lesions are present. Skull: Calvarium is intact. No focal lytic or blastic lesions are present. Sinuses/Orbits: The paranasal sinuses and mastoid air cells are clear. Globes and orbits are within normal limits.  IMPRESSION: Negative CT of the head without and with contrast. No focal lesions or enhancement to suggest septic emboli. Electronically Signed   By: San Morelle M.D.   On: 09/10/2017 20:59   Dg Knee Left Port  Result Date: 09/11/2017 CLINICAL DATA:  Acute left knee pain following fall. Initial encounter. EXAM: PORTABLE LEFT KNEE - 1-2 VIEW COMPARISON:  None. FINDINGS: The patient was unwilling to have a lateral view performed. No evidence of fracture or dislocation. No evidence of arthropathy or other focal bone  abnormality. Soft tissues are unremarkable. IMPRESSION: No significant abnormalities on three views. The patient was unwilling to have a lateral view performed and presence of is effusion cannot be assessed. Electronically Signed   By: Margarette Canada M.D.   On: 09/11/2017 08:45      Scheduled Meds: . cloNIDine  0.1 mg Oral QID   Followed by  . [START ON 09/13/2017] cloNIDine  0.1 mg Oral [id number]   Followed by  . [START ON 09/15/2017] cloNIDine  0.1 mg Oral QAC breakfast  . polyethylene glycol  17 g Oral Daily  . sodium chloride flush  3 mL Intravenous Q12H   Continuous Infusions: . sodium chloride    . vancomycin Stopped (09/11/17 1033)     LOS: 1 Gary Hicks    Time spent: 40 minutes   Gary Phi, DO [facility] [email or web address] Password [email or web address] 09/11/2017, 1:05 PM

## 2017-09-11 NOTE — ED Notes (Signed)
Spoke with Gary Hicks in pharmacy to verify compatibility of ABX and fluid

## 2017-09-11 NOTE — ED Triage Notes (Signed)
Pt returning to ER after leaving AMA today, patient reports leaving to go use heroin. Pt HR 145 in triage, appears ill. A/o x4.

## 2017-09-11 NOTE — ED Notes (Signed)
Admitting MD at bedside.

## 2017-09-11 NOTE — Progress Notes (Signed)
Pharmacy Antibiotic Note Gary Hicks is a 32 y.o. male admitted on 09/11/2017 with MSSA bacteremia in setting of IVDU. Pharmacy following for vancomycin. ID following.   Plan: 1. Vancomycin 750 mg IV every 8 hours 2. Follow up allergy to PCN and adjust ax as needed      Temp (24hrs), Avg:100.3 F (37.9 C), Min:98.7 F (37.1 C), Max:103.2 F (39.6 C)  Recent Labs  Lab 09/10/17 1811 09/10/17 1819 09/10/17 2005 09/11/17 0041 09/11/17 1826 09/11/17 1850  WBC 11.5*  --   --  9.9 16.1*  --   CREATININE 1.40*  --   --  1.25* 1.09  --   LATICACIDVEN  --  2.82* 1.89  --   --  2.72*    Estimated Creatinine Clearance: 88.2 mL/min (by C-G formula based on SCr of 1.09 mg/dL).    Allergies  Allergen Reactions  . Prednisone Hives  . Amoxicillin Hives    Thank you for allowing pharmacy to be a part of this patient's care.  Duayne Cal 09/11/2017 8:17 PM

## 2017-09-11 NOTE — ED Notes (Addendum)
Pt removed himself from monitoring equipment.  This RN rounded to find pt sitting on side of chair, texting and attempting to plug  in phone to charge.  This RN re-enforced to pt the seriousness of condition and the importance of staying on the monitor.  This RN expressed concern about risk of fall.  Pt returned to bed, placed on monitor. Pt aware of need for safety sitter, states, "I'll leave here if you do that."  MD made aware.

## 2017-09-11 NOTE — Consult Note (Signed)
[facility] for Infectious Disease    Date of Admission:  09/10/2017           [place] 1 vancomycin       Reason for Consult: Automatic consultation for staph aureus bacteremia     Assessment: He has MSSA bacteremia related to his ongoing injecting drug use.  I will order repeat blood cultures and a chest x-ray.  Echocardiogram is pending.  The erythema on his hands, knees and feet appears more vascular than due to septic joints.  He has a history of penicillin allergy.  He cannot tell me anything about the nature of the reaction or whether he has cephalosporins.  I will continue vancomycin for now.  Plan: 1. Continue vancomycin for now 2. Chest x-ray 3. Repeat blood cultures 4. Transthoracic echocardiogram 5. HIV and hepatitis C antibodies  Principal Problem:   Bacteremia due to methicillin susceptible Staphylococcus aureus (MSSA) Active Problems:   Heroin abuse (HCC)   Polysubstance abuse (HCC)   Severe sepsis (HCC)   Normocytic anemia   Thrombocytopenia (HCC)   Acute kidney injury ([place])   Cigarette smoker   Scheduled Meds: . cloNIDine  0.1 mg Oral QID   Followed by  . [START ON 09/13/2017] cloNIDine  0.1 mg Oral BH-qamhs   Followed by  . [START ON 09/15/2017] cloNIDine  0.1 mg Oral QAC breakfast  . polyethylene glycol  17 g Oral Daily  . sodium chloride flush  3 mL Intravenous Q12H   Continuous Infusions: . sodium chloride    . vancomycin Stopped (09/11/17 1033)   PRN Meds:.dicyclomine, hydrOXYzine, loperamide, methocarbamol, naproxen, ondansetron  HPI: Gary Hicks is a 32 y.o. male who was recently released from prison about 3 weeks ago.  He has been injecting heroin and methamphetamine.  He was brought to the emergency department yesterday complaining of diffuse pain.  Admission blood cultures have grown MSSA.   Review of Systems: Review of Systems  Unable to perform ROS: Mental acuity    Past Medical History:  Diagnosis Date  . Bacterial  endocarditis   . Heroin abuse ([place])   . Liver disease    possible Hep. C no treatment or confirmation   . Opiate abuse, continuous ([place])   . Polysubstance abuse ([place])   . Seizures ([place])     Social History   Tobacco Use  . Smoking status: Current Every [place] Smoker    Packs/[place]: 0.50    Types: Cigarettes  . Smokeless tobacco: Current User  Substance Use Topics  . Alcohol use: No  . Drug use: Yes    Types: IV    Comment: heroin    No family history on file. Allergies  Allergen Reactions  . Prednisone Hives  . Amoxicillin Hives    OBJECTIVE: Blood pressure 124/83, pulse (!) 134, temperature 99.4 F (37.4 C), temperature source Oral, resp. rate 16, height 5\' 9"  (1.753 m), weight 140 lb (63.5 kg), SpO2 98 %.  Physical Exam  Constitutional:  He appears alert but has difficulty answering questions.  He is distracted by his cell phone and trying to use it.  He periodically winces with apparent pain.  HENT:  Mouth/Throat: No oropharyngeal exudate.  Cardiovascular: Regular rhythm.  No murmur heard. Tachycardic with a heart rate around 140.  Pulmonary/Chest: Effort normal. He has no wheezes. He has no rales.  Abdominal: He exhibits no distension. There is no tenderness.  Musculoskeletal: Normal range of motion. He exhibits no edema  or tenderness.  Neurological: He is alert.  Skin: Rash noted.  Erythema over some fingers, both knees and feet.  He is covered in tattoos.    Lab Results Lab Results  Component Value Date   WBC 9.9 09/11/2017   HGB 12.2 (L) 09/11/2017   HCT 34.7 (L) 09/11/2017   MCV 92.3 09/11/2017   PLT 54 (L) 09/11/2017   PLT 54 (L) 09/11/2017    Lab Results  Component Value Date   CREATININE 1.25 (H) 09/11/2017   BUN 24 (H) 09/11/2017   NA 130 (L) 09/11/2017   K 2.9 (L) 09/11/2017   CL 96 (L) 09/11/2017   CO2 22 09/11/2017    Lab Results  Component Value Date   ALT 34 09/11/2017   AST 37 09/11/2017   ALKPHOS 83 09/11/2017   BILITOT 0.9  09/11/2017     Microbiology: Recent Results (from the past 240 hour(s))  Blood culture (routine x 2)     Status: None (Preliminary result)   Collection Time: 09/10/17  7:45 PM  Result Value Ref Range Status   Specimen Description BLOOD RIGHT HAND  Final   Special Requests IN PEDIATRIC BOTTLE Blood Culture adequate volume  Final   Culture  Setup Time   Final    GRAM POSITIVE COCCI IN CLUSTERS IN PEDIATRIC BOTTLE CRITICAL RESULT CALLED TO, READ BACK BY AND VERIFIED WITH: T RUDISILL,PHARMD AT 4315 09/11/17 BY L BENFIELD Performed at [facility], [place] [address] [place], [place] [address]    Culture GRAM POSITIVE COCCI  Final   Report Status PENDING  Incomplete  Blood Culture ID Panel (Reflexed)     Status: Abnormal   Collection Time: 09/10/17  7:45 PM  Result Value Ref Range Status   Enterococcus species NOT DETECTED NOT DETECTED Final   Vancomycin resistance NOT DETECTED NOT DETECTED Final   Listeria monocytogenes NOT DETECTED NOT DETECTED Final   Staphylococcus species DETECTED (A) NOT DETECTED Final    Comment: CRITICAL RESULT CALLED TO, READ BACK BY AND VERIFIED WITH: T RUDISILL,PHARMD AT 7619 09/11/17 BY L BENFIELD    Staphylococcus aureus DETECTED (A) NOT DETECTED Final    Comment: CRITICAL RESULT CALLED TO, READ BACK BY AND VERIFIED WITH: T RUDISILL,PHARMD AT 5093 09/11/17 BY L BENFIELD Methicillin (oxacillin) susceptible Staphylococcus aureus (MSSA). Preferred therapy is anti staphylococcal beta lactam antibiotic (Cefazolin or Nafcillin), unless clinically contraindicated.    Methicillin resistance NOT DETECTED NOT DETECTED Final   Streptococcus species NOT DETECTED NOT DETECTED Final   Streptococcus agalactiae NOT DETECTED NOT DETECTED Final   Streptococcus pneumoniae NOT DETECTED NOT DETECTED Final   Streptococcus pyogenes NOT DETECTED NOT DETECTED Final   Acinetobacter baumannii NOT DETECTED NOT DETECTED Final   Enterobacteriaceae species NOT DETECTED NOT DETECTED  Final   Enterobacter cloacae complex NOT DETECTED NOT DETECTED Final   Escherichia coli NOT DETECTED NOT DETECTED Final   Klebsiella oxytoca NOT DETECTED NOT DETECTED Final   Klebsiella pneumoniae NOT DETECTED NOT DETECTED Final   Proteus species NOT DETECTED NOT DETECTED Final   Serratia marcescens NOT DETECTED NOT DETECTED Final   Carbapenem resistance NOT DETECTED NOT DETECTED Final   Haemophilus influenzae NOT DETECTED NOT DETECTED Final   Neisseria meningitidis NOT DETECTED NOT DETECTED Final   Pseudomonas aeruginosa NOT DETECTED NOT DETECTED Final   Candida albicans NOT DETECTED NOT DETECTED Final   Candida glabrata NOT DETECTED NOT DETECTED Final   Candida krusei NOT DETECTED NOT DETECTED Final   Candida parapsilosis NOT DETECTED NOT  DETECTED Final   Candida tropicalis NOT DETECTED NOT DETECTED Final    Comment: Performed at [facility], [place] [address], [place], [place] [address]  Blood culture (routine x 2)     Status: None (Preliminary result)   Collection Time: 09/10/17  9:50 PM  Result Value Ref Range Status   Specimen Description BLOOD LEFT ANTECUBITAL  Final   Special Requests IN PEDIATRIC BOTTLE Blood Culture adequate volume  Final   Culture  Setup Time   Final    GRAM POSITIVE COCCI IN PEDIATRIC BOTTLE CRITICAL VALUE NOTED.  VALUE IS CONSISTENT WITH PREVIOUSLY REPORTED AND CALLED VALUE. Performed at [facility], [place] [address], [place], [place] [address]    Culture [facility] POSITIVE COCCI  Final   Report Status PENDING  Incomplete    Michel Bickers, MD [facility] for [facility]ious [place] [phone] pager   [phone] cell 09/11/2017, 1:36 PM

## 2017-09-11 NOTE — ED Notes (Signed)
This RN had lengthy discussion with pt re POC.  Pt states understanding of seriousness of condition.  Pt agrees to call 911 if resp distress/increased hear trate/ any incr distress.

## 2017-09-11 NOTE — H&P (Addendum)
PCP:   Gary Hicks, [name] Per   Chief Complaint:  Staph bacteremia  HPI: this is a Gary Hicks that was admitted yesterday with severe sepsis and staph bacteremia. He is a heroin abuser. He left AMA today. He went and used heroin and returned with this evening his dad. In the ER the Gary Hicks was somnolent but protecting his airway. Narcan not given as it may reprecipitate withdrawal. In the ER heroin was remover from patients wallet by his father. He states he uses approx half gram of heroin daily  Prior HPI recap This is a 32 year old man with active IV drug abuse, who presents with generalized pain. History is limited secondary to his acute illness including pain and fluctuations in alertness, his father is present at the bedside assists with history. The Gary Hicks reports that the pain has been happening since the end of January, father reports that 2-3 days ago he was concerned the Gary Hicks may have had a seizure because he was found unresponsive with some abnormal eye movements in the fetal position. The Gary Hicks was incarcerated and released 3-4 weeks ago. Gary Hicks admits using IV methamphetamine as well as heroin in the past 24 hours. Reports global polyarthralgias including his bilateral feet bilateral knees bilateral hips and shoulders. Also reports fevers and chills generalized myalgias. Acutely worsened today for what she calls father who brought him to seek medical attention.   Review of Systems:  The Gary Hicks denies anorexia, fever, chills, somnolence, weight loss,, vision loss, decreased hearing, hoarseness, chest pain, syncope, dyspnea on exertion, peripheral edema, balance deficits, hemoptysis, abdominal pain, melena, hematochezia, severe indigestion/heartburn, hematuria, incontinence, genital sores, muscle weakness, suspicious skin lesions, transient blindness, difficulty walking, depression, unusual weight change, abnormal bleeding, enlarged lymph nodes, angioedema, and breast masses.  Past  Medical History: Past Medical History:  Diagnosis Date  . Bacterial endocarditis   . Heroin abuse ([place])   . Liver disease    possible Hep. C no treatment or confirmation   . Opiate abuse, continuous ([place])   . Polysubstance abuse ([place])   . Seizures ([place])    Past Surgical History:  Procedure Laterality Date  . CHOLECYSTECTOMY    . HAND SURGERY      Medications: Prior to Admission medications   Medication Sig Start Date End Date Taking? Authorizing Provider  acetaminophen (TYLENOL) 500 MG tablet Take 1,000 mg by mouth every 6 (six) hours as needed for headache (pain).   Yes [provider]  ibuprofen (ADVIL,MOTRIN) 200 MG tablet Take 400 mg by mouth every 6 (six) hours as needed for headache (pain).   Yes [provider]  benzonatate (TESSALON) 100 MG capsule Take 1 capsule (100 mg total) by mouth every 8 (eight) hours. Gary Hicks not taking: Reported on 09/10/2017 01/05/15   Dahlia Bailiff, PA-C  cyclobenzaprine (FLEXERIL) 10 MG tablet Take 1 tablet (10 mg total) by mouth 2 (two) times daily as needed for muscle spasms. Gary Hicks not taking: Reported on 09/10/2017 07/06/16   Gareth Morgan, MD  HYDROcodone-acetaminophen (NORCO/VICODIN) 5-325 MG per tablet Take 1 tablet by mouth every 6 (six) hours as needed for moderate pain. Gary Hicks not taking: Reported on 09/10/2017 05/29/14   Junius Creamer, NP  ibuprofen (ADVIL,MOTRIN) 800 MG tablet Take 1 tablet (800 mg total) by mouth 3 (three) times daily. Gary Hicks not taking: Reported on 09/10/2017 01/05/15   Dahlia Bailiff, PA-C    Allergies:   Allergies  Allergen Reactions  . Prednisone Hives  . Amoxicillin Hives    Social History:  reports that  he has been smoking cigarettes.  He has been smoking about 0.50 packs per [place]. He uses smokeless tobacco. He reports that he uses drugs. Drug: IV. He reports that he does not drink alcohol.  Family History: HTN  Physical Exam: Vitals:   09/11/17 1703 09/11/17 1800 09/11/17 1922  BP: 115/60  129/72 121/77  Pulse: (!) 145 (!) 137 (!) 125  Resp: 20 17   Temp: (!) 103.2 F (39.6 C) (!) 100.4 F (38 C) 99.7 F (37.6 C)  TempSrc: Oral Oral Oral  SpO2: 100% 99% 100%    General:  Alert and oriented times three, well developed and nourished, no acute distress, somnolent Gary Hicks Eyes: PERRLA, pink conjunctiva, no scleral icterus ENT: Moist oral mucosa, neck supple, no thyromegaly Lungs: clear to ascultation, no wheeze, no crackles, no use of accessory muscles Cardiovascular: regular rate and rhythm, no regurgitation, no gallops, mild grade 1 murmur. No carotid bruits, no JVD Abdomen: soft, positive BS, non-tender, non-distended, no organomegaly, not an acute abdomen GU: not examined Neuro: CN II - XII grossly intact, sensation intact Musculoskeletal: strength 5/5 all extremities, no clubbing, cyanosis or edema, TTP all joints worse on right hip, shoulder and knee Skin:petichial rash all over feet and on hands, hands are puffy, tattoos all over, subcutaneous crepitation, no decubitus, no splinter hemorrhage appreciated Psych: somnolent but alert   Labs on Admission:  Recent Labs    09/11/17 0041 09/11/17 1826  NA 130* 130*  K 2.9* 3.4*  CL 96* 96*  CO2 22 23  GLUCOSE 129* 110*  BUN 24* 16  CREATININE 1.25* 1.09  CALCIUM 7.5* 7.7*   Recent Labs    09/11/17 0041 09/11/17 1826  AST 37 36  ALT 34 28  ALKPHOS 83 76  BILITOT 0.9 1.0  PROT 5.5* 5.7*  ALBUMIN 2.1* 1.9*   No results for input(s): LIPASE, AMYLASE in the last 72 hours. Recent Labs    09/11/17 0041 09/11/17 1826  WBC 9.9 16.1*  NEUTROABS  --  PENDING  HGB 12.2* 9.1*  HCT 34.7* 26.9*  MCV 92.3 92.1  PLT 54*  54* 73*   No results for input(s): CKTOTAL, CKMB, CKMBINDEX, TROPONINI in the last 72 hours. Invalid input(s): POCBNP Recent Labs    09/11/17 0041  DDIMER 5.46*   No results for input(s): HGBA1C in the last 72 hours. No results for input(s): CHOL, HDL, LDLCALC, TRIG, CHOLHDL, LDLDIRECT  in the last 72 hours. No results for input(s): TSH, T4TOTAL, T3FREE, THYROIDAB in the last 72 hours.  Invalid input(s): FREET3 No results for input(s): VITAMINB12, FOLATE, FERRITIN, TIBC, IRON, RETICCTPCT in the last 72 hours.  Micro Results: Recent Results (from the past 240 hour(s))  Blood culture (routine x 2)     Status: None (Preliminary result)   Collection Time: 09/10/17  7:45 PM  Result Value Ref Range Status   Specimen Description BLOOD RIGHT HAND  Final   Special Requests IN PEDIATRIC BOTTLE Blood Culture adequate volume  Final   Culture  Setup Time   Final    GRAM POSITIVE COCCI IN CLUSTERS IN PEDIATRIC BOTTLE CRITICAL RESULT CALLED TO, READ BACK BY AND VERIFIED WITH: T RUDISILL,PHARMD AT 6222 09/11/17 BY L BENFIELD Performed at [facility], [place] [address], [place], [place] [address]    Culture [facility] POSITIVE COCCI  Final   Report Status PENDING  Incomplete  Blood Culture ID Panel (Reflexed)     Status: Abnormal   Collection Time: 09/10/17  7:45 PM  Result  Value Ref Range Status   Enterococcus species NOT DETECTED NOT DETECTED Final   Vancomycin resistance NOT DETECTED NOT DETECTED Final   Listeria monocytogenes NOT DETECTED NOT DETECTED Final   Staphylococcus species DETECTED (A) NOT DETECTED Final    Comment: CRITICAL RESULT CALLED TO, READ BACK BY AND VERIFIED WITH: T RUDISILL,PHARMD AT 3785 09/11/17 BY L BENFIELD    Staphylococcus aureus DETECTED (A) NOT DETECTED Final    Comment: CRITICAL RESULT CALLED TO, READ BACK BY AND VERIFIED WITH: T RUDISILL,PHARMD AT 8850 09/11/17 BY L BENFIELD Methicillin (oxacillin) susceptible Staphylococcus aureus (MSSA). Preferred therapy is anti staphylococcal beta lactam antibiotic (Cefazolin or Nafcillin), unless clinically contraindicated.    Methicillin resistance NOT DETECTED NOT DETECTED Final   Streptococcus species NOT DETECTED NOT DETECTED Final   Streptococcus agalactiae NOT DETECTED NOT DETECTED Final   Streptococcus  pneumoniae NOT DETECTED NOT DETECTED Final   Streptococcus pyogenes NOT DETECTED NOT DETECTED Final   Acinetobacter baumannii NOT DETECTED NOT DETECTED Final   Enterobacteriaceae species NOT DETECTED NOT DETECTED Final   Enterobacter cloacae complex NOT DETECTED NOT DETECTED Final   Escherichia coli NOT DETECTED NOT DETECTED Final   Klebsiella oxytoca NOT DETECTED NOT DETECTED Final   Klebsiella pneumoniae NOT DETECTED NOT DETECTED Final   Proteus species NOT DETECTED NOT DETECTED Final   Serratia marcescens NOT DETECTED NOT DETECTED Final   Carbapenem resistance NOT DETECTED NOT DETECTED Final   Haemophilus influenzae NOT DETECTED NOT DETECTED Final   Neisseria meningitidis NOT DETECTED NOT DETECTED Final   Pseudomonas aeruginosa NOT DETECTED NOT DETECTED Final   Candida albicans NOT DETECTED NOT DETECTED Final   Candida glabrata NOT DETECTED NOT DETECTED Final   Candida krusei NOT DETECTED NOT DETECTED Final   Candida parapsilosis NOT DETECTED NOT DETECTED Final   Candida tropicalis NOT DETECTED NOT DETECTED Final    Comment: Performed at [facility], [place] [address] [place], [place] [address]  Blood culture (routine x 2)     Status: None (Preliminary result)   Collection Time: 09/10/17  9:50 PM  Result Value Ref Range Status   Specimen Description BLOOD LEFT ANTECUBITAL  Final   Special Requests IN PEDIATRIC BOTTLE Blood Culture adequate volume  Final   Culture  Setup Time   Final    GRAM POSITIVE COCCI IN PEDIATRIC BOTTLE CRITICAL VALUE NOTED.  VALUE IS CONSISTENT WITH PREVIOUSLY REPORTED AND CALLED VALUE. Performed at [facility], [place] [address], [place], [place] [address]    Culture GRAM POSITIVE COCCI  Final   Report Status PENDING  Incomplete     Radiological Exams on Admission: Ct Head W Or Wo Contrast  Result Date: 09/10/2017 CLINICAL DATA:  Endocarditis.  Infection suspected. EXAM: CT HEAD WITHOUT AND WITH CONTRAST TECHNIQUE: Contiguous axial images were  obtained from the base of the skull through the vertex without and with intravenous contrast CONTRAST:  68mL ISOVUE-300 IOPAMIDOL (ISOVUE-300) INJECTION 61% COMPARISON:  None. FINDINGS: Brain: No acute infarct, hemorrhage, or mass lesion is present. The ventricles are of normal size. No significant extraaxial fluid collection is present. Postcontrast images demonstrate no pathologic enhancement. A mega cisterna magna is noted. Cerebellum is within normal limits. Brainstem is unremarkable. Vascular: No significant vascular lesions are present. Skull: Calvarium is intact. No focal lytic or blastic lesions are present. Sinuses/Orbits: The paranasal sinuses and mastoid air cells are clear. Globes and orbits are within normal limits. IMPRESSION: Negative CT of the head without and with contrast. No focal lesions or  enhancement to suggest septic emboli. Electronically Signed   By: [place] Morelle M.D.   On: 09/10/2017 20:59   Dg Knee Left Port  Result Date: 09/11/2017 CLINICAL DATA:  Acute left knee pain following fall. Initial encounter. EXAM: PORTABLE LEFT KNEE - 1-2 VIEW COMPARISON:  None. FINDINGS: The Gary Hicks was unwilling to have a lateral view performed. No evidence of fracture or dislocation. No evidence of arthropathy or other focal bone abnormality. Soft tissues are unremarkable. IMPRESSION: No significant abnormalities on three views. The Gary Hicks was unwilling to have a lateral view performed and presence of is effusion cannot be assessed. Electronically Signed   By: Margarette Canada M.D.   On: 09/11/2017 08:45    Assessment/Plan Present on Admission: . staph Bacteremia  Severe sepsis ([place]) -admit to med tele -repeat blood cultures in ER -ID reconsulted -continue vanco pharmacy to dose -2D echo reordered -HIV and hep C antibodies ordered previously by ID  . Heroin abuse ([place]) -needs methadone but Gary Hicks currently somnolent. When more aware will initiate methadne -will consulted social  service to see if we can get Gary Hicks in a methadone clinic on discharge. Gary Hicks nor family are able to afford methadone on their own -once more awake would start with 15-20 mg methadone daily  Hypokalemia -repleting PO, BMP in AM  Thrombocytopenia -likely due to sepsis, monitor -SCD's only for DVT prophylaxis  . Cigarette smoker -nicotine patch  Right shoulder pain Right hip pain Right knee pain -xrays ordered  . Polysubstance abuse ([place]) -aware, need methasdone  . rash feet and hands -possible due to penicillin allergy. Gary Hicks unable to say if he received PCN prior to our admission. monitoring   Jadie Allington 09/11/2017, 7:42 PM

## 2017-09-11 NOTE — ED Notes (Signed)
Gary Hicks [phone] Father

## 2017-09-11 NOTE — Discharge Summary (Signed)
Physician Discharge Summary  Gary Hicks INO:[id number] DOB: 1986/07/19 DOA: 09/10/2017  PCP: Patient, [name] Per  Admit date: 09/10/2017 Discharge date: 09/11/2017  Admitted From: Home Disposition:  LEFT AMA  Brief/Interim Summary: Gary Hicks is a 32 yo male with current IVDA who presents to the hospital due to generalized pain in his joints. Per record review, patient apparently had a seizure? A few days ago when his father found patient unresponsive with abnormal eye movements at home.  At that time, EMS was notified, and patient had refused to be evaluated in the emergency department.  Patient did admit to using IV methamphetamines as well as heroin in the past 24 hours.  He admits to global polyarthralgias in bilateral feet, bilateral knees, bilateral hips and shoulders.  He also reports fevers, chills.  In the emergency department, patient has been in sinus tachycardia.  There was concern for sepsis, patient was started on empiric aztreonam, vancomycin, Levaquin after blood cultures were obtained. Blood culture resulted positive for MSSA and ID was consulted.   Patient decided to sign out against medical advice. I recommended that he stay in the hospital for continued monitoring, treatment for bloodstream infection and re-iterated to him multiple times the gravity of situation. Risk of leaving includes death and disability which was discussed with him and he verbalized he understood. He stated that he will come back to the ED if condition worsens.   Allergies  Allergen Reactions  . Prednisone Hives  . Amoxicillin Hives    Consultations:  ID   Procedures/Studies: Ct Head W Or Wo Contrast  Result Date: 09/10/2017 CLINICAL DATA:  Endocarditis.  Infection suspected. EXAM: CT HEAD WITHOUT AND WITH CONTRAST TECHNIQUE: Contiguous axial images were obtained from the base of the skull through the vertex without and with intravenous contrast CONTRAST:  65mL ISOVUE-300 IOPAMIDOL (ISOVUE-300)  INJECTION 61% COMPARISON:  None. FINDINGS: Brain: No acute infarct, hemorrhage, or mass lesion is present. The ventricles are of normal size. No significant extraaxial fluid collection is present. Postcontrast images demonstrate no pathologic enhancement. A mega cisterna magna is noted. Cerebellum is within normal limits. Brainstem is unremarkable. Vascular: No significant vascular lesions are present. Skull: Calvarium is intact. No focal lytic or blastic lesions are present. Sinuses/Orbits: The paranasal sinuses and mastoid air cells are clear. Globes and orbits are within normal limits. IMPRESSION: Negative CT of the head without and with contrast. No focal lesions or enhancement to suggest septic emboli. Electronically Signed   By: San Morelle M.D.   On: 09/10/2017 20:59   Dg Knee Left Port  Result Date: 09/11/2017 CLINICAL DATA:  Acute left knee pain following fall. Initial encounter. EXAM: PORTABLE LEFT KNEE - 1-2 VIEW COMPARISON:  None. FINDINGS: The patient was unwilling to have a lateral view performed. No evidence of fracture or dislocation. No evidence of arthropathy or other focal bone abnormality. Soft tissues are unremarkable. IMPRESSION: No significant abnormalities on three views. The patient was unwilling to have a lateral view performed and presence of is effusion cannot be assessed. Electronically Signed   By: Margarette Canada M.D.   On: 09/11/2017 08:45        Discharge Exam: Vitals:   09/11/17 1330 09/11/17 1344  BP:    Pulse: (!) 147 (!) 146  Resp: (!) 23 18  Temp:    SpO2: 100% 96%   Vitals:   09/11/17 1215 09/11/17 1300 09/11/17 1330 09/11/17 1344  BP:  (!) 134/92    Pulse: (!) 137 (!)  144 (!) 147 (!) 146  Resp:  20 (!) 23 18  Temp:      TempSrc:      SpO2: 98% 100% 100% 96%  Weight:      Height:        The results of significant diagnostics from this hospitalization (including imaging, microbiology, ancillary and laboratory) are listed below for reference.      Microbiology: Recent Results (from the past 240 hour(s))  Blood culture (routine x 2)     Status: None (Preliminary result)   Collection Time: 09/10/17  7:45 PM  Result Value Ref Range Status   Specimen Description BLOOD RIGHT HAND  Final   Special Requests IN PEDIATRIC BOTTLE Blood Culture adequate volume  Final   Culture  Setup Time   Final    GRAM POSITIVE COCCI IN CLUSTERS IN PEDIATRIC BOTTLE CRITICAL RESULT CALLED TO, READ BACK BY AND VERIFIED WITH: T RUDISILL,PHARMD AT 1610 09/11/17 BY L BENFIELD Performed at [facility], [place] [address], [place], [place] [address]    Culture GRAM POSITIVE COCCI  Final   Report Status PENDING  Incomplete  Blood Culture ID Panel (Reflexed)     Status: Abnormal   Collection Time: 09/10/17  7:45 PM  Result Value Ref Range Status   Enterococcus species NOT DETECTED NOT DETECTED Final   Vancomycin resistance NOT DETECTED NOT DETECTED Final   Listeria monocytogenes NOT DETECTED NOT DETECTED Final   Staphylococcus species DETECTED (A) NOT DETECTED Final    Comment: CRITICAL RESULT CALLED TO, READ BACK BY AND VERIFIED WITH: T RUDISILL,PHARMD AT 4098 09/11/17 BY L BENFIELD    Staphylococcus aureus DETECTED (A) NOT DETECTED Final    Comment: CRITICAL RESULT CALLED TO, READ BACK BY AND VERIFIED WITH: T RUDISILL,PHARMD AT 1191 09/11/17 BY L BENFIELD Methicillin (oxacillin) susceptible Staphylococcus aureus (MSSA). Preferred therapy is anti staphylococcal beta lactam antibiotic (Cefazolin or Nafcillin), unless clinically contraindicated.    Methicillin resistance NOT DETECTED NOT DETECTED Final   Streptococcus species NOT DETECTED NOT DETECTED Final   Streptococcus agalactiae NOT DETECTED NOT DETECTED Final   Streptococcus pneumoniae NOT DETECTED NOT DETECTED Final   Streptococcus pyogenes NOT DETECTED NOT DETECTED Final   Acinetobacter baumannii NOT DETECTED NOT DETECTED Final   Enterobacteriaceae species NOT DETECTED NOT DETECTED Final    Enterobacter cloacae complex NOT DETECTED NOT DETECTED Final   Escherichia coli NOT DETECTED NOT DETECTED Final   Klebsiella oxytoca NOT DETECTED NOT DETECTED Final   Klebsiella pneumoniae NOT DETECTED NOT DETECTED Final   Proteus species NOT DETECTED NOT DETECTED Final   Serratia marcescens NOT DETECTED NOT DETECTED Final   Carbapenem resistance NOT DETECTED NOT DETECTED Final   Haemophilus influenzae NOT DETECTED NOT DETECTED Final   Neisseria meningitidis NOT DETECTED NOT DETECTED Final   Pseudomonas aeruginosa NOT DETECTED NOT DETECTED Final   Candida albicans NOT DETECTED NOT DETECTED Final   Candida glabrata NOT DETECTED NOT DETECTED Final   Candida krusei NOT DETECTED NOT DETECTED Final   Candida parapsilosis NOT DETECTED NOT DETECTED Final   Candida tropicalis NOT DETECTED NOT DETECTED Final    Comment: Performed at [facility], [place] [address], [place], [place] [address]  Blood culture (routine x 2)     Status: None (Preliminary result)   Collection Time: 09/10/17  9:50 PM  Result Value Ref Range Status   Specimen Description BLOOD LEFT ANTECUBITAL  Final   Special Requests IN PEDIATRIC BOTTLE Blood Culture adequate volume  Final  Culture  Setup Time   Final    GRAM POSITIVE COCCI IN PEDIATRIC BOTTLE CRITICAL VALUE NOTED.  VALUE IS CONSISTENT WITH PREVIOUSLY REPORTED AND CALLED VALUE. Performed at [facility], [place] [address] [place], [place] [address]    Culture GRAM POSITIVE COCCI  Final   Report Status PENDING  Incomplete     Labs: BNP (last 3 results) No results for input(s): BNP in the last 8760 hours. Basic Metabolic Panel: Recent Labs  Lab 09/10/17 1811 09/11/17 0041  NA 130* 130*  K 3.8 2.9*  CL 91* 96*  CO2 22 22  GLUCOSE 99 129*  BUN 33* 24*  CREATININE 1.40* 1.25*  CALCIUM 8.2* 7.5*   Liver Function Tests: Recent Labs  Lab 09/10/17 1811 09/11/17 0041  AST 45* 37  ALT 42 34  ALKPHOS 148* 83  BILITOT 1.2 0.9  PROT 6.6 5.5*   ALBUMIN 2.6* 2.1*   No results for input(s): LIPASE, AMYLASE in the last 168 hours. No results for input(s): AMMONIA in the last 168 hours. CBC: Recent Labs  Lab 09/10/17 1811 09/11/17 0041  WBC 11.5* 9.9  HGB 14.2 12.2*  HCT 40.1 34.7*  MCV 92.2 92.3  PLT 49* 54*  54*   Cardiac Enzymes: No results for input(s): CKTOTAL, CKMB, CKMBINDEX, TROPONINI in the last 168 hours. BNP: Invalid input(s): POCBNP CBG: Recent Labs  Lab 09/11/17 1218  GLUCAP 97   D-Dimer Recent Labs    09/11/17 0041  DDIMER 5.46*   Hgb A1c No results for input(s): HGBA1C in the last 72 hours. Lipid Profile No results for input(s): CHOL, HDL, LDLCALC, TRIG, CHOLHDL, LDLDIRECT in the last 72 hours. Thyroid function studies No results for input(s): TSH, T4TOTAL, T3FREE, THYROIDAB in the last 72 hours.  Invalid input(s): FREET3 Anemia work up No results for input(s): VITAMINB12, FOLATE, FERRITIN, TIBC, IRON, RETICCTPCT in the last 72 hours. Urinalysis    Component Value Date/Time   COLORURINE YELLOW 09/10/2017 1657   APPEARANCEUR CLEAR 09/10/2017 1657   LABSPEC 1.019 09/10/2017 1657   PHURINE 6.0 09/10/2017 1657   GLUCOSEU NEGATIVE 09/10/2017 1657   HGBUR NEGATIVE 09/10/2017 1657   BILIRUBINUR NEGATIVE 09/10/2017 1657   KETONESUR NEGATIVE 09/10/2017 1657   PROTEINUR NEGATIVE 09/10/2017 1657   UROBILINOGEN 0.2 05/16/2012 0847   NITRITE NEGATIVE 09/10/2017 1657   LEUKOCYTESUR NEGATIVE 09/10/2017 1657   Sepsis Labs Invalid input(s): PROCALCITONIN,  WBC,  LACTICIDVEN Microbiology Recent Results (from the past 240 hour(s))  Blood culture (routine x 2)     Status: None (Preliminary result)   Collection Time: 09/10/17  7:45 PM  Result Value Ref Range Status   Specimen Description BLOOD RIGHT HAND  Final   Special Requests IN PEDIATRIC BOTTLE Blood Culture adequate volume  Final   Culture  Setup Time   Final    GRAM POSITIVE COCCI IN CLUSTERS IN PEDIATRIC BOTTLE CRITICAL RESULT CALLED TO,  READ BACK BY AND VERIFIED WITH: T RUDISILL,PHARMD AT 2542 09/11/17 BY L BENFIELD Performed at [facility], [place] [address], [place], [place] [address]    Culture GRAM POSITIVE COCCI  Final   Report Status PENDING  Incomplete  Blood Culture ID Panel (Reflexed)     Status: Abnormal   Collection Time: 09/10/17  7:45 PM  Result Value Ref Range Status   Enterococcus species NOT DETECTED NOT DETECTED Final   Vancomycin resistance NOT DETECTED NOT DETECTED Final   Listeria monocytogenes NOT DETECTED NOT DETECTED Final   Staphylococcus species DETECTED (A)  NOT DETECTED Final    Comment: CRITICAL RESULT CALLED TO, READ BACK BY AND VERIFIED WITH: T RUDISILL,PHARMD AT 4097 09/11/17 BY L BENFIELD    Staphylococcus aureus DETECTED (A) NOT DETECTED Final    Comment: CRITICAL RESULT CALLED TO, READ BACK BY AND VERIFIED WITH: T RUDISILL,PHARMD AT 3532 09/11/17 BY L BENFIELD Methicillin (oxacillin) susceptible Staphylococcus aureus (MSSA). Preferred therapy is anti staphylococcal beta lactam antibiotic (Cefazolin or Nafcillin), unless clinically contraindicated.    Methicillin resistance NOT DETECTED NOT DETECTED Final   Streptococcus species NOT DETECTED NOT DETECTED Final   Streptococcus agalactiae NOT DETECTED NOT DETECTED Final   Streptococcus pneumoniae NOT DETECTED NOT DETECTED Final   Streptococcus pyogenes NOT DETECTED NOT DETECTED Final   Acinetobacter baumannii NOT DETECTED NOT DETECTED Final   Enterobacteriaceae species NOT DETECTED NOT DETECTED Final   Enterobacter cloacae complex NOT DETECTED NOT DETECTED Final   Escherichia coli NOT DETECTED NOT DETECTED Final   Klebsiella oxytoca NOT DETECTED NOT DETECTED Final   Klebsiella pneumoniae NOT DETECTED NOT DETECTED Final   Proteus species NOT DETECTED NOT DETECTED Final   Serratia marcescens NOT DETECTED NOT DETECTED Final   Carbapenem resistance NOT DETECTED NOT DETECTED Final   Haemophilus influenzae NOT DETECTED NOT DETECTED Final    Neisseria meningitidis NOT DETECTED NOT DETECTED Final   Pseudomonas aeruginosa NOT DETECTED NOT DETECTED Final   Candida albicans NOT DETECTED NOT DETECTED Final   Candida glabrata NOT DETECTED NOT DETECTED Final   Candida krusei NOT DETECTED NOT DETECTED Final   Candida parapsilosis NOT DETECTED NOT DETECTED Final   Candida tropicalis NOT DETECTED NOT DETECTED Final    Comment: Performed at [facility], [place] [address] [place], [place] [address]  Blood culture (routine x 2)     Status: None (Preliminary result)   Collection Time: 09/10/17  9:50 PM  Result Value Ref Range Status   Specimen Description BLOOD LEFT ANTECUBITAL  Final   Special Requests IN PEDIATRIC BOTTLE Blood Culture adequate volume  Final   Culture  Setup Time   Final    GRAM POSITIVE COCCI IN PEDIATRIC BOTTLE CRITICAL VALUE NOTED.  VALUE IS CONSISTENT WITH PREVIOUSLY REPORTED AND CALLED VALUE. Performed at [facility], [place] [address], [place], [place] [address]    Culture [facility] POSITIVE COCCI  Final   Report Status PENDING  Incomplete    SIGNED:  Dessa Phi, DO [facility] Pager [phone]  If 7PM-7AM, please contact night-coverage [email or web address] Password [email or web address] 09/11/2017, 2:56 PM

## 2017-09-11 NOTE — ED Notes (Signed)
Ordered patient a hospital bed. Pt seemed uncomfortable and was ok with idea. Pt was able to stand and changed linens while he stood. Needed assistance

## 2017-09-11 NOTE — ED Notes (Signed)
Pt.had a visitor to visit have security to come

## 2017-09-11 NOTE — ED Notes (Signed)
Writer attempted to collect blood, unsuccessful X 1

## 2017-09-11 NOTE — ED Notes (Signed)
Elohim (father) took patients wallet home with him. Pt did keep his cell phone and is in the bed with him

## 2017-09-11 NOTE — ED Notes (Signed)
Pt was transferred over to a hospital bed.

## 2017-09-12 ENCOUNTER — Other Ambulatory Visit: Payer: Self-pay

## 2017-09-12 ENCOUNTER — Inpatient Hospital Stay (HOSPITAL_COMMUNITY): Payer: Self-pay

## 2017-09-12 DIAGNOSIS — M00011 Staphylococcal arthritis, right shoulder: Secondary | ICD-10-CM | POA: Diagnosis present

## 2017-09-12 DIAGNOSIS — M25552 Pain in left hip: Secondary | ICD-10-CM

## 2017-09-12 DIAGNOSIS — F17210 Nicotine dependence, cigarettes, uncomplicated: Secondary | ICD-10-CM

## 2017-09-12 DIAGNOSIS — R011 Cardiac murmur, unspecified: Secondary | ICD-10-CM

## 2017-09-12 DIAGNOSIS — M25551 Pain in right hip: Secondary | ICD-10-CM

## 2017-09-12 DIAGNOSIS — A4101 Sepsis due to Methicillin susceptible Staphylococcus aureus: Secondary | ICD-10-CM

## 2017-09-12 DIAGNOSIS — I330 Acute and subacute infective endocarditis: Secondary | ICD-10-CM

## 2017-09-12 DIAGNOSIS — D65 Disseminated intravascular coagulation [defibrination syndrome]: Secondary | ICD-10-CM | POA: Insufficient documentation

## 2017-09-12 DIAGNOSIS — Z888 Allergy status to other drugs, medicaments and biological substances status: Secondary | ICD-10-CM

## 2017-09-12 DIAGNOSIS — M71162 Other infective bursitis, left knee: Secondary | ICD-10-CM

## 2017-09-12 DIAGNOSIS — Z880 Allergy status to penicillin: Secondary | ICD-10-CM

## 2017-09-12 DIAGNOSIS — M25511 Pain in right shoulder: Secondary | ICD-10-CM

## 2017-09-12 DIAGNOSIS — Z881 Allergy status to other antibiotic agents status: Secondary | ICD-10-CM

## 2017-09-12 DIAGNOSIS — R569 Unspecified convulsions: Secondary | ICD-10-CM

## 2017-09-12 DIAGNOSIS — B9689 Other specified bacterial agents as the cause of diseases classified elsewhere: Secondary | ICD-10-CM | POA: Diagnosis present

## 2017-09-12 DIAGNOSIS — M00051 Staphylococcal arthritis, right hip: Secondary | ICD-10-CM | POA: Diagnosis present

## 2017-09-12 DIAGNOSIS — F1110 Opioid abuse, uncomplicated: Secondary | ICD-10-CM

## 2017-09-12 DIAGNOSIS — F419 Anxiety disorder, unspecified: Secondary | ICD-10-CM

## 2017-09-12 DIAGNOSIS — R233 Spontaneous ecchymoses: Secondary | ICD-10-CM

## 2017-09-12 DIAGNOSIS — I368 Other nonrheumatic tricuspid valve disorders: Secondary | ICD-10-CM

## 2017-09-12 DIAGNOSIS — M00052 Staphylococcal arthritis, left hip: Secondary | ICD-10-CM | POA: Diagnosis present

## 2017-09-12 DIAGNOSIS — L818 Other specified disorders of pigmentation: Secondary | ICD-10-CM

## 2017-09-12 DIAGNOSIS — M25562 Pain in left knee: Secondary | ICD-10-CM

## 2017-09-12 DIAGNOSIS — I079 Rheumatic tricuspid valve disease, unspecified: Secondary | ICD-10-CM | POA: Diagnosis present

## 2017-09-12 LAB — CBC
HEMATOCRIT: 28.7 % — AB (ref 39.0–52.0)
HEMOGLOBIN: 9.7 g/dL — AB (ref 13.0–17.0)
MCH: 31.6 pg (ref 26.0–34.0)
MCHC: 33.8 g/dL (ref 30.0–36.0)
MCV: 93.5 fL (ref 78.0–100.0)
Platelets: 52 10*3/uL — ABNORMAL LOW (ref 150–400)
RBC: 3.07 MIL/uL — ABNORMAL LOW (ref 4.22–5.81)
RDW: 14.2 % (ref 11.5–15.5)
WBC: 9.3 10*3/uL (ref 4.0–10.5)

## 2017-09-12 LAB — BLOOD CULTURE ID PANEL (REFLEXED)
Acinetobacter baumannii: NOT DETECTED
CANDIDA KRUSEI: NOT DETECTED
CANDIDA PARAPSILOSIS: NOT DETECTED
CANDIDA TROPICALIS: NOT DETECTED
CARBAPENEM RESISTANCE: NOT DETECTED
Candida albicans: NOT DETECTED
Candida glabrata: NOT DETECTED
Enterobacter cloacae complex: NOT DETECTED
Enterobacteriaceae species: NOT DETECTED
Enterococcus species: NOT DETECTED
Escherichia coli: NOT DETECTED
Haemophilus influenzae: NOT DETECTED
KLEBSIELLA OXYTOCA: NOT DETECTED
KLEBSIELLA PNEUMONIAE: NOT DETECTED
LISTERIA MONOCYTOGENES: NOT DETECTED
Methicillin resistance: NOT DETECTED
Neisseria meningitidis: NOT DETECTED
PROTEUS SPECIES: NOT DETECTED
Pseudomonas aeruginosa: NOT DETECTED
SERRATIA MARCESCENS: NOT DETECTED
STAPHYLOCOCCUS SPECIES: DETECTED — AB
Staphylococcus aureus (BCID): DETECTED — AB
Streptococcus agalactiae: NOT DETECTED
Streptococcus pneumoniae: NOT DETECTED
Streptococcus pyogenes: NOT DETECTED
Streptococcus species: NOT DETECTED
Vancomycin resistance: NOT DETECTED

## 2017-09-12 LAB — ECHOCARDIOGRAM COMPLETE
Height: 69 in
Weight: 2257.5104 oz

## 2017-09-12 LAB — LACTIC ACID, PLASMA: Lactic Acid, Venous: 1.4 mmol/L (ref 0.5–1.9)

## 2017-09-12 LAB — BASIC METABOLIC PANEL
ANION GAP: 11 (ref 5–15)
BUN: 11 mg/dL (ref 6–20)
CO2: 22 mmol/L (ref 22–32)
Calcium: 7.5 mg/dL — ABNORMAL LOW (ref 8.9–10.3)
Chloride: 102 mmol/L (ref 101–111)
Creatinine, Ser: 0.84 mg/dL (ref 0.61–1.24)
GFR calc Af Amer: >60 mL/min (ref >60)
GFR calc non Af Amer: >60 mL/min (ref >60)
GLUCOSE: 112 mg/dL — AB (ref 65–99)
POTASSIUM: 3.5 mmol/L (ref 3.5–5.1)
Sodium: 135 mmol/L (ref 135–145)

## 2017-09-12 MED ORDER — LORAZEPAM 2 MG/ML IJ SOLN
1.0000 mg | Freq: Four times a day (QID) | INTRAMUSCULAR | Status: DC | PRN
  Administered 2017-09-12 – 2017-09-14 (×5): 2 mg via INTRAVENOUS
  Filled 2017-09-12 (×5): qty 1

## 2017-09-12 MED ORDER — CEFAZOLIN SODIUM-DEXTROSE 2-4 GM/100ML-% IV SOLN
2.0000 g | Freq: Three times a day (TID) | INTRAVENOUS | Status: DC
  Administered 2017-09-12 – 2017-10-20 (×114): 2 g via INTRAVENOUS
  Filled 2017-09-12 (×118): qty 100

## 2017-09-12 MED ORDER — KCL IN DEXTROSE-NACL 20-5-0.45 MEQ/L-%-% IV SOLN
INTRAVENOUS | Status: DC
  Administered 2017-09-12 – 2017-09-14 (×7): via INTRAVENOUS
  Filled 2017-09-12 (×8): qty 1000

## 2017-09-12 MED ORDER — HYDROMORPHONE HCL 1 MG/ML IJ SOLN
2.0000 mg | INTRAMUSCULAR | Status: AC
  Administered 2017-09-12: 2 mg via INTRAVENOUS
  Filled 2017-09-12: qty 2

## 2017-09-12 MED ORDER — BUPIVACAINE HCL (PF) 0.5 % IJ SOLN
10.0000 mL | Freq: Once | INTRAMUSCULAR | Status: DC
  Filled 2017-09-12: qty 30

## 2017-09-12 MED ORDER — LIDOCAINE HCL (PF) 1 % IJ SOLN
5.0000 mL | Freq: Once | INTRAMUSCULAR | Status: DC
Start: 2017-09-12 — End: 2017-09-15

## 2017-09-12 MED ORDER — HYDROMORPHONE HCL 1 MG/ML IJ SOLN
1.0000 mg | Freq: Four times a day (QID) | INTRAMUSCULAR | Status: DC | PRN
  Administered 2017-09-12 – 2017-09-13 (×4): 1 mg via INTRAVENOUS
  Filled 2017-09-12 (×4): qty 1

## 2017-09-12 MED ORDER — NALOXONE HCL 0.4 MG/ML IJ SOLN: 0.4000 mg | INTRAMUSCULAR | Status: DC | PRN

## 2017-09-12 NOTE — Progress Notes (Signed)
  Echocardiogram 2D Echocardiogram has been performed.  Gary Hicks [place]us 09/12/2017, 6:25 PM

## 2017-09-12 NOTE — Consult Note (Signed)
[facility]    Date of Admission:  09/11/2017     Total days of antibiotics 2               Reason for Consult: MSSA Bacteremia    Referring Provider: Aggie Moats Primary Care Provider: Patient, [name] Per   Assessment/Plan:  [name] has MSSA bacteremia secondary to his ongoing drug use. There is concern with an induration behind his right calf for abscess or possible infection of the left knee. X-rays have been negative with no evidence of osteomyelitis. He has a murmur noted which is concerning for endocarditis. His penicillin allergy appears to be an intolerance as he did not have known anaphylaxis. Rash of lower extremities appears to be vascular. His continued IVDU complicates his treatment as he would be a poor candidate to go home with a PICC line. His repeat blood cultures from 2/10 are no growth in <24 hours.  1. Continue vancomycin. Consider challenge of allergy with Ancef as it appears to be an intolerance.  2. TTE ordered to rule out endocarditis. 3. Orthopedic consult to evaluate left knee for possible septic joint. 4. Check Hepatitis C - HIV was negative.   We will continue to follow.   Active Problems:   Heroin abuse (HCC)   Polysubstance abuse (HCC)   Severe sepsis ([place])   Bacteremia due to Staphylococcus aureus   Cigarette smoker   Hypokalemia   . acetaminophen  650 mg Rectal Once  . nicotine  14 mg Transdermal Daily     HPI: Gary Hicks is a 32 y.o. male with history of IVDU who left the hospital AMA on 2/10 with bacterial endocarditis and returned later the same evening appearing ill. He was initally admitted on 2/9 with the chief complaint of generalized pain. He was admitted with severe sepsis and was found to have MSSA bacteremia. He was started on vancomycin as he is allergic to penicillin and amoxicillin, although he is unsure of his reaction to these medications.   T-max of 103.2 overnight with stable leukocytosis. Currently  receiving vancomycin. X-ray imaging of the left knee with no significant abnormalities; lateral view unable to be assessed as Mr. Schwanke was unwilling. Right shoulder with no fracture, dislocation or evident arthropathy. Left hip with no fracture, dislocation or evidence of arthropathy.  Repeat blood cultures from 2/10 are in process. Echocardiogram completed with results pending. His blood work continues to show thrombocytopenia.   Continues to feel generalized pain with no specific spot that he can pinpoint. His parents are in the room and provide some of the information. He does not recall having penicillin or amoxicillin. His mother believes he had a rash when he was younger. Has never had amoxicillin since. She thinks, but is unsure, if he has had and tolerated Keflex in the past. He injects his heroin primarily in his lower extremities.   Review of Systems: Review of Systems  Unable to perform ROS: Acuity of condition     Past Medical History:  Diagnosis Date  . Bacterial endocarditis   . Heroin abuse ([place])   . Liver disease    possible Hep. C no treatment or confirmation   . Opiate abuse, continuous ([place])   . Polysubstance abuse ([place])   . Seizures ([place])     Social History   Tobacco Use  . Smoking status: Current Every Proto Smoker    Packs/[place]: 0.50    Types: Cigarettes  . Smokeless tobacco:  Current User  Substance Use Topics  . Alcohol use: No  . Drug use: Yes    Types: IV    Comment: heroin    History reviewed. No pertinent family history.  Allergies  Allergen Reactions  . Prednisone Hives  . Amoxicillin Hives    OBJECTIVE: Blood pressure 121/84, pulse (!) 103, temperature 97.7 F (36.5 C), temperature source Oral, resp. rate 18, height 5\' 9"  (1.753 m), weight 141 lb 1.5 oz (64 kg), SpO2 100 %.  Physical Exam  Constitutional:  Initially very lethargic and progressively became more alert during the exam.   Neck: Neck supple.  Cardiovascular: Normal rate, regular  rhythm and intact distal pulses. Exam reveals no gallop and no friction rub.  Murmur (Questional faint murmur in pulmonic area) heard. Pulmonary/Chest: Effort normal and breath sounds normal. No respiratory distress. He has no wheezes. He has no rales. He exhibits no tenderness.  Musculoskeletal:  Right calf - No obvious deformity, discoloration or edema. There is an area of firm induration on the medial proximal calf with questionable injection sites. Distal pulses and sensation are intact and appropriate.  Left knee - Obvious redness and edema. No tenderness able to elicited during exam. Distal pulses and sensation are intact and appropriate.   Lymphadenopathy:    He has no cervical adenopathy.  Neurological:  Lethargic  Skin: Skin is warm and dry. Rash noted.  Covered in numerous tattoos. Bilateral feet with redness and mild edema.           Lab Results Lab Results  Component Value Date   WBC 9.3 09/12/2017   HGB 9.7 (L) 09/12/2017   HCT 28.7 (L) 09/12/2017   MCV 93.5 09/12/2017   PLT 52 (L) 09/12/2017    Lab Results  Component Value Date   CREATININE 0.84 09/12/2017   BUN 11 09/12/2017   NA 135 09/12/2017   K 3.5 09/12/2017   CL 102 09/12/2017   CO2 22 09/12/2017    Lab Results  Component Value Date   ALT 28 09/11/2017   AST 36 09/11/2017   ALKPHOS 76 09/11/2017   BILITOT 1.0 09/11/2017     Microbiology: Recent Results (from the past 240 hour(s))  Blood culture (routine x 2)     Status: Abnormal (Preliminary result)   Collection Time: 09/10/17  7:45 PM  Result Value Ref Range Status   Specimen Description BLOOD RIGHT HAND  Final   Special Requests IN PEDIATRIC BOTTLE Blood Culture adequate volume  Final   Culture  Setup Time   Final    GRAM POSITIVE COCCI IN CLUSTERS IN PEDIATRIC BOTTLE CRITICAL RESULT CALLED TO, READ BACK BY AND VERIFIED WITH: T RUDISILL,PHARMD AT 1884 09/11/17 BY L BENFIELD    Culture (A)  Final    STAPHYLOCOCCUS AUREUS SUSCEPTIBILITIES  TO FOLLOW Performed at [facility], [place] [address], [place], [place] [address]    Report Status PENDING  Incomplete  Blood Culture ID Panel (Reflexed)     Status: Abnormal   Collection Time: 09/10/17  7:45 PM  Result Value Ref Range Status   Enterococcus species NOT DETECTED NOT DETECTED Final   Vancomycin resistance NOT DETECTED NOT DETECTED Final   Listeria monocytogenes NOT DETECTED NOT DETECTED Final   Staphylococcus species DETECTED (A) NOT DETECTED Final    Comment: CRITICAL RESULT CALLED TO, READ BACK BY AND VERIFIED WITH: T RUDISILL,PHARMD AT 3016 09/11/17 BY L BENFIELD    Staphylococcus aureus DETECTED (A) NOT DETECTED Final    Comment:  CRITICAL RESULT CALLED TO, READ BACK BY AND VERIFIED WITH: T RUDISILL,PHARMD AT 7062 09/11/17 BY L BENFIELD Methicillin (oxacillin) susceptible Staphylococcus aureus (MSSA). Preferred therapy is anti staphylococcal beta lactam antibiotic (Cefazolin or Nafcillin), unless clinically contraindicated.    Methicillin resistance NOT DETECTED NOT DETECTED Final   Streptococcus species NOT DETECTED NOT DETECTED Final   Streptococcus agalactiae NOT DETECTED NOT DETECTED Final   Streptococcus pneumoniae NOT DETECTED NOT DETECTED Final   Streptococcus pyogenes NOT DETECTED NOT DETECTED Final   Acinetobacter baumannii NOT DETECTED NOT DETECTED Final   Enterobacteriaceae species NOT DETECTED NOT DETECTED Final   Enterobacter cloacae complex NOT DETECTED NOT DETECTED Final   Escherichia coli NOT DETECTED NOT DETECTED Final   Klebsiella oxytoca NOT DETECTED NOT DETECTED Final   Klebsiella pneumoniae NOT DETECTED NOT DETECTED Final   Proteus species NOT DETECTED NOT DETECTED Final   Serratia marcescens NOT DETECTED NOT DETECTED Final   Carbapenem resistance NOT DETECTED NOT DETECTED Final   Haemophilus influenzae NOT DETECTED NOT DETECTED Final   Neisseria meningitidis NOT DETECTED NOT DETECTED Final   Pseudomonas aeruginosa NOT DETECTED NOT DETECTED  Final   Candida albicans NOT DETECTED NOT DETECTED Final   Candida glabrata NOT DETECTED NOT DETECTED Final   Candida krusei NOT DETECTED NOT DETECTED Final   Candida parapsilosis NOT DETECTED NOT DETECTED Final   Candida tropicalis NOT DETECTED NOT DETECTED Final    Comment: Performed at [facility], [place] [address] [place], [place] [address]  Blood culture (routine x 2)     Status: Abnormal (Preliminary result)   Collection Time: 09/10/17  9:50 PM  Result Value Ref Range Status   Specimen Description BLOOD LEFT ANTECUBITAL  Final   Special Requests IN PEDIATRIC BOTTLE Blood Culture adequate volume  Final   Culture  Setup Time   Final    GRAM POSITIVE COCCI IN PEDIATRIC BOTTLE CRITICAL VALUE NOTED.  VALUE IS CONSISTENT WITH PREVIOUSLY REPORTED AND CALLED VALUE. Performed at [facility], [place] [address] [place], [place] [address]    Culture STAPHYLOCOCCUS AUREUS (A)  Final   Report Status PENDING  Incomplete     Terri Piedra, NP [facility] [phone] Pager  09/12/2017  10:19 AM

## 2017-09-12 NOTE — Progress Notes (Signed)
Called was placed at 2327 last night  To get pt for scan and RN was going to call back. RN called back at 2332 and pt refused scan

## 2017-09-12 NOTE — Progress Notes (Signed)
Pt with elevated temp = 102.9. Pt given tylenol 650 mg per order.  Page to Dr. Aggie Moats for notification. Dorthey Sawyer, RN

## 2017-09-12 NOTE — Progress Notes (Signed)
PHARMACY - PHYSICIAN COMMUNICATION CRITICAL VALUE ALERT - BLOOD CULTURE IDENTIFICATION (BCID)  Gary Hicks is an 32 y.o. male who presented to [facility] on 09/11/2017 with a chief complaint of Staph Aureus bacteremia  Assessment:  36 YOM with MSSA Bacteremia related to IVDU. ID on board and getting full work-up  Name of physician (or Provider) Contacted: Aggie Moats (via text page)  Current antibiotics: Cefazolin 2g IV every 8 hours  Changes to prescribed antibiotics recommended:  Patient is on recommended antibiotics - No changes needed  Lab just contacted to state that blood culture from 2/10 was growing GPC in clusters - unable to do BCID at this time but likely still the same as previous cultures on 2/9 stated below:  Results for orders placed or performed during the hospital encounter of 09/10/17  Blood Culture ID Panel (Reflexed) (Collected: 09/10/2017  7:45 PM)  Result Value Ref Range   Enterococcus species NOT DETECTED NOT DETECTED   Vancomycin resistance NOT DETECTED NOT DETECTED   Listeria monocytogenes NOT DETECTED NOT DETECTED   Staphylococcus species DETECTED (A) NOT DETECTED   Staphylococcus aureus DETECTED (A) NOT DETECTED   Methicillin resistance NOT DETECTED NOT DETECTED   Streptococcus species NOT DETECTED NOT DETECTED   Streptococcus agalactiae NOT DETECTED NOT DETECTED   Streptococcus pneumoniae NOT DETECTED NOT DETECTED   Streptococcus pyogenes NOT DETECTED NOT DETECTED   Acinetobacter baumannii NOT DETECTED NOT DETECTED   Enterobacteriaceae species NOT DETECTED NOT DETECTED   Enterobacter cloacae complex NOT DETECTED NOT DETECTED   Escherichia coli NOT DETECTED NOT DETECTED   Klebsiella oxytoca NOT DETECTED NOT DETECTED   Klebsiella pneumoniae NOT DETECTED NOT DETECTED   Proteus species NOT DETECTED NOT DETECTED   Serratia marcescens NOT DETECTED NOT DETECTED   Carbapenem resistance NOT DETECTED NOT DETECTED   Haemophilus influenzae NOT DETECTED NOT DETECTED   Neisseria meningitidis NOT DETECTED NOT DETECTED   Pseudomonas aeruginosa NOT DETECTED NOT DETECTED   Candida albicans NOT DETECTED NOT DETECTED   Candida glabrata NOT DETECTED NOT DETECTED   Candida krusei NOT DETECTED NOT DETECTED   Candida parapsilosis NOT DETECTED NOT DETECTED   Candida tropicalis NOT DETECTED NOT DETECTED    Gary Hicks 09/12/2017  2:21 PM

## 2017-09-12 NOTE — Progress Notes (Signed)
Pharmacy Antibiotic Note Gary Hicks is a 32 y.o. male admitted on 09/11/2017 with MSSA bacteremia in setting of IVDU. Pharmacy initially consulted for vancomycin and now narrowing to cefazolin per ID. Tmax is 103.2 and WBC is WNL. Scr is excellent.   Plan: Cefazolin 2gm IV Q8H F/u renal fxn, C&S, clinical status and LOT *Pharmacy will sign off and only follow peripherally as needed since no dose adjustments are anticipated. Thank you for the consult!  Height: 5\' 9"  (175.3 cm) Weight: 141 lb 1.5 oz (64 kg) IBW/kg (Calculated) : 70.7  Temp (24hrs), Avg:100.2 F (37.9 C), Min:97.7 F (36.5 C), Max:103.2 F (39.6 C)  Recent Labs  Lab 09/10/17 1811 09/10/17 1819 09/10/17 2005 09/11/17 0041 09/11/17 1826 09/11/17 1850 09/11/17 2127 09/12/17 0629  WBC 11.5*  --   --  9.9 16.1*  --   --  9.3  CREATININE 1.40*  --   --  1.25* 1.09  --   --  0.84  LATICACIDVEN  --  2.82* 1.89  --   --  2.72* 2.8* 1.4    Estimated Creatinine Clearance: 115.3 mL/min (by C-G formula based on SCr of 0.84 mg/dL).    Allergies  Allergen Reactions  . Prednisone Hives  . Amoxicillin Hives    Thank you for allowing pharmacy to be a part of this patient's care.  Amalia Edgecombe, Rande Lawman 09/12/2017 1:01 PM

## 2017-09-12 NOTE — Progress Notes (Signed)
[facility] PROGRESS NOTE  Gary Hicks XBM:[id number] DOB: 1986-07-07 DOA: 09/11/2017 PCP: Patient, [name] Per  Assessment/Plan:  Sepsis Repeat blood cultures pending ID reconsultation pending Vancomycin to be dosed by pharmacy Transthoracic echo pending Hip C antibodies HIV test still pending from previous admission Maintenance IV fluids  Somnolence secondary to methadone and heroin use CT ordered by admitting physician was refused by patient will reattempt Narcan as needed  Convulsions Likely related to drug use We will consider MRI after head CT Seizure precautions PRN Ativan  Thrombocytopenia Likely due to sepsis We will monitor SCDs for DVT prophylaxis Tobacco abuse Nicotine patch  Right shoulder pain this is chronic x-ray shoulder shows no fracture We will consider further imaging  Polysubstance abuse Patient is high risk for substance abuse recurrence Is requesting methadone which based on patient's behavior is a plan that is not optimal Case management to help patient with resources outpatient Appearance interested in getting patient into rehab  Code Status: FC Family Communication: parents at bedside Disposition Plan: tbd   Consultants:  ID consulted  Procedures:  ---  Antibiotics:  Vanc 2/10-->P  HPI/Subjective: Pt is drowsy.  Patient refused CT of the head last night.  Admitted to doing meth in addition to heroin at the bedside this morning.  Patient requesting to be started on methadone.  Patient's family at the bedside and they are concerned about patient being started on methadone.  Family relates patient has prior episodes of convulsion and a concern for seizure history that has not been worked up.  One instance was associated with ecstasy.  The second was unwitnessed.  No seizures related to this hospitalization or the previous one where the patient left AMA.  Objective: Vitals:   09/12/17 0516 09/12/17 1001  BP: 122/72 121/84   Pulse: (!) 119 (!) 103  Resp: 18 18  Temp: 98.8 F (37.1 C) 97.7 F (36.5 C)  SpO2: 99% 100%    Intake/Output Summary (Last 24 hours) at 09/12/2017 1035 Last data filed at 09/12/2017 0517 Gross per 24 hour  Intake 630 ml  Output 800 ml  Net -170 ml   Filed Weights   09/12/17 0545  Weight: 64 kg (141 lb 1.5 oz)    Exam:   General:  NCAT, NAD, somnolence, GCS 13  Cardiovascular: Tachycardia, no MRG  Respiratory: CTAB, nl wob  Abdomen: ND, NT, BS+  Musculoskeletal: moving all extr  Skin: petechiae on feet  Data Reviewed: Basic Metabolic Panel: Recent Labs  Lab 09/10/17 1811 09/11/17 0041 09/11/17 1826 09/11/17 2100 09/12/17 0629  NA 130* 130* 130*  --  135  K 3.8 2.9* 3.4*  --  3.5  CL 91* 96* 96*  --  102  CO2 22 22 23   --  22  GLUCOSE 99 129* 110*  --  112*  BUN 33* 24* 16  --  11  CREATININE 1.40* 1.25* 1.09  --  0.84  CALCIUM 8.2* 7.5* 7.7*  --  7.5*  MG  --   --   --  1.7  --    Liver Function Tests: Recent Labs  Lab 09/10/17 1811 09/11/17 0041 09/11/17 1826  AST 45* 37 36  ALT 42 34 28  ALKPHOS 148* 83 76  BILITOT 1.2 0.9 1.0  PROT 6.6 5.5* 5.7*  ALBUMIN 2.6* 2.1* 1.9*   No results for input(s): LIPASE, AMYLASE in the last 168 hours. No results for input(s): AMMONIA in the last 168 hours. CBC: Recent Labs  Lab 09/10/17  1811 09/11/17 0041 09/11/17 1826 09/12/17 0629  WBC 11.5* 9.9 16.1* 9.3  NEUTROABS  --   --  12.7*  --   HGB 14.2 12.2* 9.1* 9.7*  HCT 40.1 34.7* 26.9* 28.7*  MCV 92.2 92.3 92.1 93.5  PLT 49* 54*  54* 73* 52*   Cardiac Enzymes: No results for input(s): CKTOTAL, CKMB, CKMBINDEX, TROPONINI in the last 168 hours. BNP (last 3 results) No results for input(s): BNP in the last 8760 hours.  ProBNP (last 3 results) No results for input(s): PROBNP in the last 8760 hours.  CBG: Recent Labs  Lab 09/11/17 1218  GLUCAP 97    Recent Results (from the past 240 hour(s))  Blood culture (routine x 2)     Status:  Abnormal (Preliminary result)   Collection Time: 09/10/17  7:45 PM  Result Value Ref Range Status   Specimen Description BLOOD RIGHT HAND  Final   Special Requests IN PEDIATRIC BOTTLE Blood Culture adequate volume  Final   Culture  Setup Time   Final    GRAM POSITIVE COCCI IN CLUSTERS IN PEDIATRIC BOTTLE CRITICAL RESULT CALLED TO, READ BACK BY AND VERIFIED WITH: T RUDISILL,PHARMD AT 4268 09/11/17 BY L BENFIELD    Culture (A)  Final    STAPHYLOCOCCUS AUREUS SUSCEPTIBILITIES TO FOLLOW Performed at [facility], [place] [address] [place], [place] [address]    Report Status PENDING  Incomplete  Blood Culture ID Panel (Reflexed)     Status: Abnormal   Collection Time: 09/10/17  7:45 PM  Result Value Ref Range Status   Enterococcus species NOT DETECTED NOT DETECTED Final   Vancomycin resistance NOT DETECTED NOT DETECTED Final   Listeria monocytogenes NOT DETECTED NOT DETECTED Final   Staphylococcus species DETECTED (A) NOT DETECTED Final    Comment: CRITICAL RESULT CALLED TO, READ BACK BY AND VERIFIED WITH: T RUDISILL,PHARMD AT 2229 09/11/17 BY L BENFIELD    Staphylococcus aureus DETECTED (A) NOT DETECTED Final    Comment: CRITICAL RESULT CALLED TO, READ BACK BY AND VERIFIED WITH: T RUDISILL,PHARMD AT 7989 09/11/17 BY L BENFIELD Methicillin (oxacillin) susceptible Staphylococcus aureus (MSSA). Preferred therapy is anti staphylococcal beta lactam antibiotic (Cefazolin or Nafcillin), unless clinically contraindicated.    Methicillin resistance NOT DETECTED NOT DETECTED Final   Streptococcus species NOT DETECTED NOT DETECTED Final   Streptococcus agalactiae NOT DETECTED NOT DETECTED Final   Streptococcus pneumoniae NOT DETECTED NOT DETECTED Final   Streptococcus pyogenes NOT DETECTED NOT DETECTED Final   Acinetobacter baumannii NOT DETECTED NOT DETECTED Final   Enterobacteriaceae species NOT DETECTED NOT DETECTED Final   Enterobacter cloacae complex NOT DETECTED NOT DETECTED Final    Escherichia coli NOT DETECTED NOT DETECTED Final   Klebsiella oxytoca NOT DETECTED NOT DETECTED Final   Klebsiella pneumoniae NOT DETECTED NOT DETECTED Final   Proteus species NOT DETECTED NOT DETECTED Final   Serratia marcescens NOT DETECTED NOT DETECTED Final   Carbapenem resistance NOT DETECTED NOT DETECTED Final   Haemophilus influenzae NOT DETECTED NOT DETECTED Final   Neisseria meningitidis NOT DETECTED NOT DETECTED Final   Pseudomonas aeruginosa NOT DETECTED NOT DETECTED Final   Candida albicans NOT DETECTED NOT DETECTED Final   Candida glabrata NOT DETECTED NOT DETECTED Final   Candida krusei NOT DETECTED NOT DETECTED Final   Candida parapsilosis NOT DETECTED NOT DETECTED Final   Candida tropicalis NOT DETECTED NOT DETECTED Final    Comment: Performed at [facility], [place] [address] [place], [place] [address]  Blood culture (  routine x 2)     Status: Abnormal (Preliminary result)   Collection Time: 09/10/17  9:50 PM  Result Value Ref Range Status   Specimen Description BLOOD LEFT ANTECUBITAL  Final   Special Requests IN PEDIATRIC BOTTLE Blood Culture adequate volume  Final   Culture  Setup Time   Final    GRAM POSITIVE COCCI IN PEDIATRIC BOTTLE CRITICAL VALUE NOTED.  VALUE IS CONSISTENT WITH PREVIOUSLY REPORTED AND CALLED VALUE. Performed at [facility], [place] [address], [place], [place] [address]    Culture STAPHYLOCOCCUS AUREUS (A)  Final   Report Status PENDING  Incomplete     Studies: Dg Shoulder Right  Result Date: 09/11/2017 CLINICAL DATA:  Pain. EXAM: RIGHT SHOULDER - 2+ VIEW COMPARISON:  None. FINDINGS: Frontal and Y scapular images were obtained. No fracture or dislocation. Joint spaces appear normal. No erosive change or bony destruction. Visualized right lung clear. IMPRESSION: No fracture or dislocation.  No evident arthropathy. Electronically Signed   By: Lowella Grip III M.D.   On: 09/11/2017 21:12   Ct Head W Or Wo Contrast  Result Date:  09/10/2017 CLINICAL DATA:  Endocarditis.  Infection suspected. EXAM: CT HEAD WITHOUT AND WITH CONTRAST TECHNIQUE: Contiguous axial images were obtained from the base of the skull through the vertex without and with intravenous contrast CONTRAST:  65mL ISOVUE-300 IOPAMIDOL (ISOVUE-300) INJECTION 61% COMPARISON:  None. FINDINGS: Brain: No acute infarct, hemorrhage, or mass lesion is present. The ventricles are of normal size. No significant extraaxial fluid collection is present. Postcontrast images demonstrate no pathologic enhancement. A mega cisterna magna is noted. Cerebellum is within normal limits. Brainstem is unremarkable. Vascular: No significant vascular lesions are present. Skull: Calvarium is intact. No focal lytic or blastic lesions are present. Sinuses/Orbits: The paranasal sinuses and mastoid air cells are clear. Globes and orbits are within normal limits. IMPRESSION: Negative CT of the head without and with contrast. No focal lesions or enhancement to suggest septic emboli. Electronically Signed   By: San Morelle M.D.   On: 09/10/2017 20:59   Dg Knee Left Port  Result Date: 09/11/2017 CLINICAL DATA:  Acute left knee pain following fall. Initial encounter. EXAM: PORTABLE LEFT KNEE - 1-2 VIEW COMPARISON:  None. FINDINGS: The patient was unwilling to have a lateral view performed. No evidence of fracture or dislocation. No evidence of arthropathy or other focal bone abnormality. Soft tissues are unremarkable. IMPRESSION: No significant abnormalities on three views. The patient was unwilling to have a lateral view performed and presence of is effusion cannot be assessed. Electronically Signed   By: Margarette Canada M.D.   On: 09/11/2017 08:45   Dg Hip Unilat With Pelvis 2-3 Views Right  Result Date: 09/11/2017 CLINICAL DATA:  Pain EXAM: DG HIP (WITH OR WITHOUT PELVIS) 2-3V RIGHT COMPARISON:  None. FINDINGS: Frontal pelvis as well as frontal and lateral right hip images were obtained. There is no  fracture or dislocation. Joint spaces appear normal. No erosive change or bony destruction. IMPRESSION: No fracture or dislocation.  No evident arthropathy. Electronically Signed   By: Lowella Grip III M.D.   On: 09/11/2017 21:13    Scheduled Meds: . acetaminophen  650 mg Rectal Once  . nicotine  14 mg Transdermal Daily   Continuous Infusions: . dextrose 5 % and 0.45 % NaCl with KCl 20 mEq/L    . vancomycin Stopped (09/12/17 0715)    Active Problems:   Heroin abuse (HCC)   Polysubstance abuse (HCC)   Severe  sepsis ([place])   Bacteremia due to Staphylococcus aureus   Cigarette smoker   Hypokalemia    Time spent: [facility] Pager [place]. If 7PM-7AM, please contact night-coverage at [email or web address], password [facility] 09/12/2017, 10:35 AM  LOS: 1 Guidice

## 2017-09-12 NOTE — Procedures (Signed)
Procedure: Left knee aspiration and injection  Indication: Left knee effusion(s)  Surgeon: Silvestre Gunner, PA-C  Assist: None  Anesthesia: None  EBL: None  Complications: None  Findings: After risks/benefits explained patient desires to undergo procedure. Consent obtained and time out performed. The left knee was sterilely prepped and aspirated. Rigors during the procedure provided technical difficulty. Pt aborted procedure about 30s into attempt. Would not allow further attempts.     Lisette Abu, PA-C Orthopedic Surgery [phone]

## 2017-09-12 NOTE — Care Management Note (Addendum)
Case Management Note  Patient Details  Name: Gary Hicks MRN: [id number] Date of Birth: [birth year]  Subjective/Objective:    Admitted for Bacteremia due to Staphylococcus Aureus.               Action/Plan: NCM in to speak with patient, mother at bedside and permission given by patient to speak in front of family. Prior to admission patient lived at home with spouse. Discussed recommendation for substance abuse resources outpatient.  A list of Outpatient Treatment Centers given to patient with dates/times they accept new patients and  advised cost ranges $14-15 per visit for methadone.  Patient verbalized understanding.  Patient states [facility] in the past.  NCM provided contact/hours for [facility] to patient.  Also discussed [name], offered patient (3)clinic choices to get PCP without Insurance and patient choose [facility].  Permission given to assist patient with appointment. Call placed [facility] stated they are not taking any new patients at this time; losing two providers.  Called [facility] and they require patient to apply for orange card first.  Call placed to [facility], appointment scheduled and patient is required to pay $20 for the first visit.  Uses [facility] on [address] in [place], [place].  Patient states he has transportation home when discharged.  Expected Discharge Date:                  Expected Discharge Plan:   Home self-care  Discharge planning Services  CM Consult    Choice offered for PCP:   patient and mother  PCP appointment Arranged at:   [facility].  Status of Service:  In process, will continue to follow  Additional Comments:  Kristen Cardinal, RN  Nurse Case Manager [phone] 09/12/2017, 1:59 PM

## 2017-09-12 NOTE — Consult Note (Signed)
Reason for Consult:Polyarthralgia Referring Physician: P Zaidan Keeble Gary Hicks is an 32 y.o. male.  HPI: Gary Hicks was admitted with progressive joint pain since last week. He's been out of prison for a few weeks and injecting meth. He was doing heroin initially. He came in with generalized pain but denied fevers, chills, or sweats. His joint pain has been somewhat migratory with involvement of bilateral ankles/feet, knees, hips, and right shoulder. Today the hips are worst followed by the left knee and right shoulder. The other joints have subsided. He is very anxious and having uncontrollable rigors and will/can not contribute much to history.   Past Medical History:  Diagnosis Date  . Bacterial endocarditis   . Heroin abuse ([place])   . Liver disease    possible Hep. C no treatment or confirmation   . Opiate abuse, continuous ([place])   . Polysubstance abuse ([place])   . Seizures ([place])     Past Surgical History:  Procedure Laterality Date  . CHOLECYSTECTOMY    . HAND SURGERY      History reviewed. No pertinent family history.  Social History:  reports that he has been smoking cigarettes.  He has been smoking about 0.50 packs per [place]. He uses smokeless tobacco. He reports that he uses drugs. Drug: IV. He reports that he does not drink alcohol.  Allergies:  Allergies  Allergen Reactions  . Prednisone Hives  . Amoxicillin Hives    Medications: I have reviewed the patient's current medications.  Results for orders placed or performed during the hospital encounter of 09/11/17 (from the past 48 hour(s))  Comprehensive metabolic panel     Status: Abnormal   Collection Time: 09/11/17  6:26 PM  Result Value Ref Range   Sodium 130 (L) 135 - 145 mmol/L   Potassium 3.4 (L) 3.5 - 5.1 mmol/L   Chloride 96 (L) 101 - 111 mmol/L   CO2 23 22 - 32 mmol/L   Glucose, Bld 110 (H) 65 - 99 mg/dL   BUN 16 6 - 20 mg/dL   Creatinine, Ser 1.09 0.61 - 1.24 mg/dL   Calcium 7.7 (L) 8.9 - 10.3 mg/dL   Total  Protein 5.7 (L) 6.5 - 8.1 g/dL   Albumin 1.9 (L) 3.5 - 5.0 g/dL   AST 36 15 - 41 U/L   ALT 28 17 - 63 U/L   Alkaline Phosphatase 76 38 - 126 U/L   Total Bilirubin 1.0 0.3 - 1.2 mg/dL   GFR calc non Af Amer >60 >60 mL/min   GFR calc Af Amer >60 >60 mL/min    Comment: (NOTE) The eGFR has been calculated using the CKD EPI equation. This calculation has not been validated in all clinical situations. eGFR's persistently <60 mL/min signify possible Chronic Kidney Disease.    Anion gap 11 5 - 15    Comment: Performed at [facility] [address], [place], [place] [address]  CBC with Differential/Platelet     Status: Abnormal   Collection Time: 09/11/17  6:26 PM  Result Value Ref Range   WBC 16.1 (H) 4.0 - 10.5 K/uL   RBC 2.92 (L) 4.22 - 5.81 MIL/uL   Hemoglobin 9.1 (L) 13.0 - 17.0 g/dL    Comment: DELTA CHECK NOTED RESULT REPEATED AND VERIFIED    HCT 26.9 (L) 39.0 - 52.0 %   MCV 92.1 78.0 - 100.0 fL   MCH 31.2 26.0 - 34.0 pg   MCHC 33.8 30.0 - 36.0 g/dL   RDW 13.7  11.5 - 15.5 %   Platelets 73 (L) 150 - 400 K/uL    Comment: SPECIMEN CHECKED FOR CLOTS REPEATED TO VERIFY PLATELET COUNT CONFIRMED BY SMEAR    Neutrophils Relative % 79 %   Lymphocytes Relative 8 %   Monocytes Relative 13 %   Eosinophils Relative 0 %   Basophils Relative 0 %   Neutro Abs 12.7 (H) 1.7 - 7.7 K/uL   Lymphs Abs 1.3 0.7 - 4.0 K/uL   Monocytes Absolute 2.1 (H) 0.1 - 1.0 K/uL   Eosinophils Absolute 0.0 0.0 - 0.7 K/uL   Basophils Absolute 0.0 0.0 - 0.1 K/uL   Smear Review MORPHOLOGY UNREMARKABLE     Comment: Performed at [facility] [address], [place], [place] [address]  Protime-INR     Status: Abnormal   Collection Time: 09/11/17  6:26 PM  Result Value Ref Range   Prothrombin Time 16.1 (H) 11.4 - 15.2 seconds   INR 1.30     Comment: Performed at [facility] [address], [place], [place] [address]  I-stat troponin, ED     Status: None   Collection Time: 09/11/17  6:48  PM  Result Value Ref Range   Troponin i, poc 0.00 0.00 - 0.08 ng/mL   Comment 3            Comment: Due to the release kinetics of cTnI, a negative result within the first hours of the onset of symptoms does not rule out myocardial infarction with certainty. If myocardial infarction is still suspected, repeat the test at appropriate intervals.   I-Stat CG4 Lactic Acid, ED     Status: Abnormal   Collection Time: 09/11/17  6:50 PM  Result Value Ref Range   Lactic Acid, Venous 2.72 (HH) 0.5 - 1.9 mmol/L   Comment NOTIFIED PHYSICIAN   Urine rapid drug screen (hosp performed)     Status: Abnormal   Collection Time: 09/11/17  8:21 PM  Result Value Ref Range   Opiates POSITIVE (A) NONE DETECTED   Cocaine NONE DETECTED NONE DETECTED   Benzodiazepines NONE DETECTED NONE DETECTED   Amphetamines NONE DETECTED NONE DETECTED   Tetrahydrocannabinol POSITIVE (A) NONE DETECTED   Barbiturates NONE DETECTED NONE DETECTED    Comment: (NOTE) DRUG SCREEN FOR MEDICAL PURPOSES ONLY.  IF CONFIRMATION IS NEEDED FOR ANY PURPOSE, NOTIFY LAB WITHIN 5 DAYS. LOWEST DETECTABLE LIMITS FOR URINE DRUG SCREEN Drug Class                     Cutoff (ng/mL) Amphetamine and metabolites    1000 Barbiturate and metabolites    200 Benzodiazepine                 347 Tricyclics and metabolites     300 Opiates and metabolites        300 Cocaine and metabolites        300 THC                            50 Performed at [facility], [place] [address], [place], [place] [address]   Magnesium     Status: None   Collection Time: 09/11/17  9:00 PM  Result Value Ref Range   Magnesium 1.7 1.7 - 2.4 mg/dL    Comment: Performed at [facility], [place] [address] [place], [place] [address]  Lactic acid, plasma     Status:  Abnormal   Collection Time: 09/11/17  9:27 PM  Result Value Ref Range   Lactic Acid, Venous 2.8 (HH) 0.5 - 1.9 mmol/L    Comment: CRITICAL RESULT CALLED TO, READ BACK BY AND VERIFIED  WITH: MURPHY,N RN 09/11/2017 2223 JORDANS Performed at [facility], [place] [address], [place], [place] [address]   Blood gas, arterial     Status: Abnormal   Collection Time: 09/11/17 11:40 PM  Result Value Ref Range   pH, Arterial 7.488 (H) 7.350 - 7.450   pCO2 arterial 35.4 32.0 - 48.0 mmHg   pO2, Arterial 86.4 83.0 - 108.0 mmHg   Bicarbonate 26.2 20.0 - 28.0 mmol/L   Acid-Base Excess 3.2 (H) 0.0 - 2.0 mmol/L   O2 Saturation 97.1 %   Patient temperature 100.5    Collection site REVIEWED BY    Drawn by [id number]    Sample type ARTERIAL    Allens test (pass/fail) PASS PASS  Basic metabolic panel     Status: Abnormal   Collection Time: 09/12/17  6:29 AM  Result Value Ref Range   Sodium 135 135 - 145 mmol/L   Potassium 3.5 3.5 - 5.1 mmol/L   Chloride 102 101 - 111 mmol/L   CO2 22 22 - 32 mmol/L   Glucose, Bld 112 (H) 65 - 99 mg/dL   BUN 11 6 - 20 mg/dL   Creatinine, Ser 0.84 0.61 - 1.24 mg/dL   Calcium 7.5 (L) 8.9 - 10.3 mg/dL   GFR calc non Af Amer >60 >60 mL/min   GFR calc Af Amer >60 >60 mL/min    Comment: (NOTE) The eGFR has been calculated using the CKD EPI equation. This calculation has not been validated in all clinical situations. eGFR's persistently <60 mL/min signify possible Chronic Kidney Disease.    Anion gap 11 5 - 15    Comment: Performed at [facility] [address] [place], [place] [address]  CBC     Status: Abnormal   Collection Time: 09/12/17  6:29 AM  Result Value Ref Range   WBC 9.3 4.0 - 10.5 K/uL   RBC 3.07 (L) 4.22 - 5.81 MIL/uL   Hemoglobin 9.7 (L) 13.0 - 17.0 g/dL   HCT 28.7 (L) 39.0 - 52.0 %   MCV 93.5 78.0 - 100.0 fL   MCH 31.6 26.0 - 34.0 pg   MCHC 33.8 30.0 - 36.0 g/dL   RDW 14.2 11.5 - 15.5 %   Platelets 52 (L) 150 - 400 K/uL    Comment: CONSISTENT WITH PREVIOUS RESULT Performed at [facility], [place] [address], [place], [place] [address]   Lactic acid, plasma     Status: None   Collection Time: 09/12/17  6:29 AM   Result Value Ref Range   Lactic Acid, Venous 1.4 0.5 - 1.9 mmol/L    Comment: Performed at [facility] [address], [place], [place] [address]    Dg Shoulder Right  Result Date: 09/11/2017 CLINICAL DATA:  Pain. EXAM: RIGHT SHOULDER - 2+ VIEW COMPARISON:  None. FINDINGS: Frontal and Y scapular images were obtained. No fracture or dislocation. Joint spaces appear normal. No erosive change or bony destruction. Visualized right lung clear. IMPRESSION: No fracture or dislocation.  No evident arthropathy. Electronically Signed   By: Lowella Grip III M.D.   On: 09/11/2017 21:12   Ct Head Wo Contrast  Result Date: 09/12/2017 CLINICAL DATA:  Seizure activity, IV drug use. EXAM: CT HEAD WITHOUT CONTRAST  TECHNIQUE: Contiguous axial images were obtained from the base of the skull through the vertex without intravenous contrast. COMPARISON:  09/10/2016 FINDINGS: Brain: Ventricles are within normal. CSF spaces are unremarkable. There is mild stable prominence of the cisterna magna. No mass, mass effect, shift of midline structures or acute hemorrhage. No evidence of acute infarction. Vascular: No hyperdense vessel or unexpected calcification. Skull: Normal. Negative for fracture or focal lesion. Sinuses/Orbits: No acute finding. Other: None. IMPRESSION: No acute findings. Electronically Signed   By: Marin Olp M.D.   On: 09/12/2017 11:53   Ct Head W Or Wo Contrast  Result Date: 09/10/2017 CLINICAL DATA:  Endocarditis.  Infection suspected. EXAM: CT HEAD WITHOUT AND WITH CONTRAST TECHNIQUE: Contiguous axial images were obtained from the base of the skull through the vertex without and with intravenous contrast CONTRAST:  60m ISOVUE-300 IOPAMIDOL (ISOVUE-300) INJECTION 61% COMPARISON:  None. FINDINGS: Brain: No acute infarct, hemorrhage, or mass lesion is present. The ventricles are of normal size. No significant extraaxial fluid collection is present. Postcontrast images demonstrate no  pathologic enhancement. A mega cisterna magna is noted. Cerebellum is within normal limits. Brainstem is unremarkable. Vascular: No significant vascular lesions are present. Skull: Calvarium is intact. No focal lytic or blastic lesions are present. Sinuses/Orbits: The paranasal sinuses and mastoid air cells are clear. Globes and orbits are within normal limits. IMPRESSION: Negative CT of the head without and with contrast. No focal lesions or enhancement to suggest septic emboli. Electronically Signed   By: CSan MorelleM.D.   On: 09/10/2017 20:59   Dg Knee Left Port  Result Date: 09/11/2017 CLINICAL DATA:  Acute left knee pain following fall. Initial encounter. EXAM: PORTABLE LEFT KNEE - 1-2 VIEW COMPARISON:  None. FINDINGS: The patient was unwilling to have a lateral view performed. No evidence of fracture or dislocation. No evidence of arthropathy or other focal bone abnormality. Soft tissues are unremarkable. IMPRESSION: No significant abnormalities on three views. The patient was unwilling to have a lateral view performed and presence of is effusion cannot be assessed. Electronically Signed   By: JMargarette CanadaM.D.   On: 09/11/2017 08:45   Dg Hip Unilat With Pelvis 2-3 Views Right  Result Date: 09/11/2017 CLINICAL DATA:  Pain EXAM: DG HIP (WITH OR WITHOUT PELVIS) 2-3V RIGHT COMPARISON:  None. FINDINGS: Frontal pelvis as well as frontal and lateral right hip images were obtained. There is no fracture or dislocation. Joint spaces appear normal. No erosive change or bony destruction. IMPRESSION: No fracture or dislocation.  No evident arthropathy. Electronically Signed   By: WLowella GripIII M.D.   On: 09/11/2017 21:13    Review of Systems  Constitutional: Negative for weight loss.  HENT: Negative for ear discharge, ear pain, hearing loss and tinnitus.   Eyes: Negative for blurred vision, double vision, photophobia and pain.  Respiratory: Negative for cough, sputum production and  shortness of breath.   Cardiovascular: Negative for chest pain.  Gastrointestinal: Negative for abdominal pain, nausea and vomiting.  Genitourinary: Negative for dysuria, flank pain, frequency and urgency.  Musculoskeletal: Positive for joint pain (Polyarthraliga, mostly right shoulder, hips, left knee). Negative for back pain, falls, myalgias and neck pain.  Neurological: Negative for dizziness, tingling, sensory change, focal weakness, loss of consciousness and headaches.  Endo/Heme/Allergies: Does not bruise/bleed easily.  Psychiatric/Behavioral: Negative for depression, memory loss and substance abuse. The patient is not nervous/anxious.    Blood pressure 121/84, pulse (!) 103, temperature 97.7 F (36.5 C), temperature source Oral, resp.  rate 18, height _0  (1.753 m), weight 64 kg (141 lb 1.5 oz), SpO2 100 %. Physical Exam  Constitutional: He appears well-developed and well-nourished. No distress.  HENT:  Head: Normocephalic and atraumatic.  Eyes: Conjunctivae are normal. Right eye exhibits no discharge. Left eye exhibits no discharge. No scleral icterus.  Neck: Normal range of motion.  Cardiovascular: Normal rate and regular rhythm.  Respiratory: Effort normal. No respiratory distress.  Musculoskeletal:  Bilateral shoulder, elbow, wrist, digits- no skin wounds, right shoulder ttp anteriorly, pain with PROM, would not actively range  Sens  Ax/R/M/U intact  Mot   Ax/ R/ PIN/ M/ AIN/ U intact  Rad 2+  Pelvis--no traumatic wounds or rash, no ecchymosis, stable to manual stress but severe pain with palpation  LLE No traumatic wounds or ecchymosis. Erythema over knee, ankle and forefoot, warm over erythematous areas  Knee mod TTP, would not actively range but PROM 75-180  No knee or ankle effusion  Sens DPN, SPN, TN intact  Motor EHL, ext, flex, evers 5/5  DP 2+, PT 1+, 1+ NP edema foot  RLE No traumatic wounds or ecchymosis. Erythematous rash ankle/forefoot  Nontender  No knee  or ankle effusion  Knee stable to varus/ valgus and anterior/posterior stress, mild pain with manipulation  Sens DPN, SPN, TN intact  Motor EHL, ext, flex, evers 5/5  DP 2+, PT 1+, 1+ NP edema   Neurological: He is alert.  Skin: Skin is warm and dry. He is not diaphoretic.  Psychiatric: He has a normal mood and affect. His behavior is normal.    Assessment/Plan: Polyarthralgia -- Hematologic seeding of multiple joints by IVDU would be very unusual. I attempted aspiration of the left knee but he does not have a large effusion and I was unable to aspirate any synovial fluid before he made me stop. He would not allow me to try again with a different approach. I have added MR of the knee to help with diagnosis. Given multiple joints involved, the rash, and murmur have to consider ARF as source. Echo pending. Also DGI is high in differential. Should he change his mind and would allow repeat attempt at aspiration please call. Otherwise we will f/u with MRI results.    Lisette Abu, PA-C Orthopedic Surgery [phone] 09/12/2017, 3:56 PM

## 2017-09-13 ENCOUNTER — Inpatient Hospital Stay (HOSPITAL_COMMUNITY): Payer: Self-pay

## 2017-09-13 DIAGNOSIS — R509 Fever, unspecified: Secondary | ICD-10-CM

## 2017-09-13 DIAGNOSIS — R21 Rash and other nonspecific skin eruption: Secondary | ICD-10-CM

## 2017-09-13 DIAGNOSIS — B9561 Methicillin susceptible Staphylococcus aureus infection as the cause of diseases classified elsewhere: Secondary | ICD-10-CM

## 2017-09-13 DIAGNOSIS — L02419 Cutaneous abscess of limb, unspecified: Secondary | ICD-10-CM | POA: Diagnosis present

## 2017-09-13 LAB — BASIC METABOLIC PANEL
Anion gap: 11 (ref 5–15)
BUN: 9 mg/dL (ref 6–20)
CO2: 19 mmol/L — AB (ref 22–32)
Calcium: 7.1 mg/dL — ABNORMAL LOW (ref 8.9–10.3)
Chloride: 102 mmol/L (ref 101–111)
Creatinine, Ser: 0.99 mg/dL (ref 0.61–1.24)
GFR calc Af Amer: >60 mL/min (ref >60)
GLUCOSE: 143 mg/dL — AB (ref 65–99)
POTASSIUM: 3.3 mmol/L — AB (ref 3.5–5.1)
Sodium: 132 mmol/L — ABNORMAL LOW (ref 135–145)

## 2017-09-13 LAB — CULTURE, BLOOD (ROUTINE X 2)
Special Requests: ADEQUATE
Special Requests: ADEQUATE

## 2017-09-13 LAB — CBC WITH DIFFERENTIAL/PLATELET
BASOS PCT: 0 %
Basophils Absolute: 0 10*3/uL (ref 0.0–0.1)
EOS PCT: 0 %
Eosinophils Absolute: 0 10*3/uL (ref 0.0–0.7)
HEMATOCRIT: 25.3 % — AB (ref 39.0–52.0)
Hemoglobin: 8.7 g/dL — ABNORMAL LOW (ref 13.0–17.0)
LYMPHS ABS: 1.7 10*3/uL (ref 0.7–4.0)
Lymphocytes Relative: 21 %
MCH: 32.2 pg (ref 26.0–34.0)
MCHC: 34.4 g/dL (ref 30.0–36.0)
MCV: 93.7 fL (ref 78.0–100.0)
MONOS PCT: 7 %
Monocytes Absolute: 0.6 10*3/uL (ref 0.1–1.0)
NEUTROS ABS: 5.8 10*3/uL (ref 1.7–7.7)
Neutrophils Relative %: 72 %
Platelets: 52 10*3/uL — ABNORMAL LOW (ref 150–400)
RBC: 2.7 MIL/uL — ABNORMAL LOW (ref 4.22–5.81)
RDW: 14.1 % (ref 11.5–15.5)
WBC: 8.1 10*3/uL (ref 4.0–10.5)

## 2017-09-13 LAB — C-REACTIVE PROTEIN: CRP: 18.7 mg/dL — ABNORMAL HIGH (ref <1.0)

## 2017-09-13 LAB — SEDIMENTATION RATE: SED RATE: 82 mm/h — AB (ref 0–16)

## 2017-09-13 MED ORDER — SODIUM CHLORIDE 0.9 % IV BOLUS (SEPSIS)
500.0000 mL | Freq: Once | INTRAVENOUS | Status: AC
  Administered 2017-09-13: 500 mL via INTRAVENOUS

## 2017-09-13 MED ORDER — IBUPROFEN 400 MG PO TABS
800.0000 mg | ORAL_TABLET | Freq: Once | ORAL | Status: AC
  Administered 2017-09-13: 800 mg via ORAL
  Filled 2017-09-13: qty 2

## 2017-09-13 MED ORDER — HYDROMORPHONE HCL 1 MG/ML IJ SOLN
1.0000 mg | Freq: Two times a day (BID) | INTRAMUSCULAR | Status: DC | PRN
  Administered 2017-09-14: 1 mg via INTRAVENOUS
  Filled 2017-09-13: qty 1

## 2017-09-13 MED ORDER — POTASSIUM CHLORIDE CRYS ER 20 MEQ PO TBCR
30.0000 meq | EXTENDED_RELEASE_TABLET | Freq: Two times a day (BID) | ORAL | Status: DC
  Administered 2017-09-13 – 2017-09-16 (×7): 30 meq via ORAL
  Filled 2017-09-13 (×7): qty 1
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
## 2017-09-13 NOTE — Consult Note (Signed)
[facility]  Date of Admission:  09/11/2017     Total days of antibiotics3         ASSESSMENT/PLAN  Gary Hicks echocardiogram showed a tricuspid valve vegetation confirming MSSA endocarditis. The MRI of his right hip shows possibility of myositis without evidence of abscess or septic joint. He is currently tolerating Ancef with no adverse side effects or signs of anaphylaxis. It appears that most of his joint pains are likely associated with his MSSA endocarditis. He will require at least 6 weeks of IV antibiotics. He is not a candidate for a PICC line at this time given his most recent substance abuse. Per father patient may be amendable to starting Suboxone to reduce heroin usage. Will discuss with Internal Medicine Teaching Services.  He appears to have a significant amount of anxiety that may complicate his treatment. He is not on any baseline medications at this time. He did receive one dose of Ativan last night.  Will defer to primary for possible need to for psychological evaluation.  1. Continue Ancef. 2. [place] right calf and MRI of right shoulder 3. Will discuss possible Suboxone with IM Teaching Services  4. Anxiety per primary team.  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 acetaminophen  650 mg Rectal Once  . bupivacaine  10 mL Infiltration Once  . lidocaine (PF)  5 mL Other Once  . nicotine  14 mg Transdermal Daily    SUBJECTIVE:  He is asleep upon examination. His father is in the room with him. Echocardiogram positive to tricuspid vegetation  confirming endocarditis. MRI with no evidence of septic joint or abscess. His max temperature yesterday was 102.3. Continues to have whole body pain. He is tolerating the Cefazolin without adverse side effects presently.   His father expresses that he is under a lot of stress and has a significant amount of anxiety. Indicates that the stressors are not in relation to the current problem, but likely a contributing factor to his recurrent drug usage.     Allergies  Allergen Reactions  . Prednisone Hives  . Amoxicillin Hives     Review of Systems: Review of Systems  Constitutional: Positive for fever. Negative for chills.  Respiratory: Negative for cough, sputum production, shortness of breath and wheezing.   Cardiovascular: Negative for chest pain and leg swelling.  Genitourinary: Negative for dysuria, flank pain, frequency and urgency.  Skin: Positive for rash.  Neurological: Negative for weakness.      OBJECTIVE: Vitals:   09/12/17 1823 09/12/17 2240 09/13/17 0452 09/13/17 0530  BP:  (!) 125/56  117/69  Pulse:  (!) 109  (!) 136  Resp:  19  19  Temp: 99.7 F (37.6 C) 98.8 F (37.1 C) 98.6 F (37 C) 98.9 F (37.2 C)  TempSrc:  Oral Oral Oral  SpO2:  100%  98%  Weight:  141 lb 2 oz (64 kg)    Height:       Body mass index is 20.84 kg/m.  Physical Exam  Constitutional: No distress.  Lying in bed; lethargic  at best; moans with movement.   Eyes: Pupils are equal, round, and reactive to light.  Neck: Neck supple.  Cardiovascular: Exam reveals no gallop and no friction rub.  Murmur heard. Pulmonary/Chest: Effort normal and breath sounds normal. No respiratory distress. He has no wheezes. He has no rales. He exhibits no tenderness.  Abdominal: Soft. Bowel sounds are normal. He exhibits no distension and no mass. There is no tenderness. There is no rebound and no guarding.  Musculoskeletal:  Right knee slightly improved from yesterday with decreased redness.     Lymphadenopathy:    He has no cervical adenopathy.  Skin: Skin is warm and dry.  Continues to have red colored rash located around both distal lower ankles and feet. No other rashes noted.     Lab Results Lab Results  Component Value Date   WBC 9.3 09/12/2017   HGB 9.7 (L) 09/12/2017   HCT 28.7 (L) 09/12/2017   MCV 93.5 09/12/2017   PLT 52 (L) 09/12/2017    Lab Results  Component Value Date   CREATININE 0.84 09/12/2017   BUN 11 09/12/2017   NA 135 09/12/2017   K 3.5 09/12/2017   CL 102 09/12/2017   CO2 22 09/12/2017    Lab Results  Component Value Date   ALT 28 09/11/2017   AST 36 09/11/2017   ALKPHOS 76 09/11/2017   BILITOT 1.0 09/11/2017     Microbiology: Recent Results (from the past 240 hour(s))  Blood culture (routine x 2)     Status: Abnormal   Collection Time: 09/10/17  7:45 PM  Result Value Ref Range Status   Specimen Description BLOOD RIGHT HAND  Final   Special Requests IN PEDIATRIC BOTTLE Blood Culture adequate volume  Final   Culture  Setup Time   Final    GRAM POSITIVE COCCI IN CLUSTERS IN PEDIATRIC BOTTLE CRITICAL RESULT CALLED TO, READ BACK BY AND VERIFIED WITH: T RUDISILL,PHARMD AT 2094 09/11/17 BY L BENFIELD Performed at [facility], [place] [address], [place], [place] [address]    Culture STAPHYLOCOCCUS AUREUS (A)  Final   Report Status 09/13/2017 FINAL  Final   Organism ID, Bacteria STAPHYLOCOCCUS AUREUS  Final      Susceptibility   Staphylococcus aureus - MIC*    CIPROFLOXACIN <=0.5 SENSITIVE Sensitive     ERYTHROMYCIN <=0.25 SENSITIVE Sensitive     GENTAMICIN <=0.5 SENSITIVE Sensitive     OXACILLIN 0.5 SENSITIVE Sensitive     TETRACYCLINE <=1 SENSITIVE Sensitive     VANCOMYCIN 1 SENSITIVE Sensitive     TRIMETH/SULFA <=10 SENSITIVE Sensitive     CLINDAMYCIN <=0.25 SENSITIVE Sensitive     RIFAMPIN <=0.5 SENSITIVE Sensitive     Inducible Clindamycin NEGATIVE Sensitive     * STAPHYLOCOCCUS AUREUS  Blood Culture ID Panel (Reflexed)      Status: Abnormal   Collection Time: 09/10/17  7:45 PM  Result Value Ref Range Status   Enterococcus species NOT DETECTED NOT DETECTED Final   Vancomycin resistance NOT DETECTED NOT DETECTED Final   Listeria monocytogenes NOT DETECTED NOT DETECTED Final   Staphylococcus species DETECTED (A) NOT DETECTED Final    Comment: CRITICAL RESULT CALLED TO, READ BACK BY AND VERIFIED WITH: T RUDISILL,PHARMD AT 8366 09/11/17 BY L BENFIELD    Staphylococcus aureus DETECTED (A) NOT DETECTED Final    Comment: CRITICAL RESULT CALLED TO, READ BACK BY AND VERIFIED WITH: T RUDISILL,PHARMD AT 2947 09/11/17 BY L BENFIELD Methicillin (oxacillin) susceptible Staphylococcus aureus (MSSA). Preferred therapy is  anti staphylococcal beta lactam antibiotic (Cefazolin or Nafcillin), unless clinically contraindicated.    Methicillin resistance NOT DETECTED NOT DETECTED Final   Streptococcus species NOT DETECTED NOT DETECTED Final   Streptococcus agalactiae NOT DETECTED NOT DETECTED Final   Streptococcus pneumoniae NOT DETECTED NOT DETECTED Final   Streptococcus pyogenes NOT DETECTED NOT DETECTED Final   Acinetobacter baumannii NOT DETECTED NOT DETECTED Final   Enterobacteriaceae species NOT DETECTED NOT DETECTED Final   Enterobacter cloacae complex NOT DETECTED NOT DETECTED Final   Escherichia coli NOT DETECTED NOT DETECTED Final   Klebsiella oxytoca NOT DETECTED NOT DETECTED Final   Klebsiella pneumoniae NOT DETECTED NOT DETECTED Final   Proteus species NOT DETECTED NOT DETECTED Final   Serratia marcescens NOT DETECTED NOT DETECTED Final   Carbapenem resistance NOT DETECTED NOT DETECTED Final   Haemophilus influenzae NOT DETECTED NOT DETECTED Final   Neisseria meningitidis NOT DETECTED NOT DETECTED Final   Pseudomonas aeruginosa NOT DETECTED NOT DETECTED Final   Candida albicans NOT DETECTED NOT DETECTED Final   Candida glabrata NOT DETECTED NOT DETECTED Final   Candida krusei NOT DETECTED NOT DETECTED Final    Candida parapsilosis NOT DETECTED NOT DETECTED Final   Candida tropicalis NOT DETECTED NOT DETECTED Final    Comment: Performed at [facility], [place] [address] [place], [place] [address]  Blood culture (routine x 2)     Status: Abnormal   Collection Time: 09/10/17  9:50 PM  Result Value Ref Range Status   Specimen Description BLOOD LEFT ANTECUBITAL  Final   Special Requests IN PEDIATRIC BOTTLE Blood Culture adequate volume  Final   Culture  Setup Time   Final    GRAM POSITIVE COCCI IN PEDIATRIC BOTTLE CRITICAL VALUE NOTED.  VALUE IS CONSISTENT WITH PREVIOUSLY REPORTED AND CALLED VALUE.    Culture (A)  Final    STAPHYLOCOCCUS AUREUS SUSCEPTIBILITIES PERFORMED ON PREVIOUS CULTURE WITHIN THE LAST 5 DAYS. Performed at [facility], [place] [address] [place], [place] [address]    Report Status 09/13/2017 FINAL  Final  Culture, blood (routine x 2)     Status: None (Preliminary result)   Collection Time: 09/11/17  6:35 PM  Result Value Ref Range Status   Specimen Description BLOOD LEFT ANTECUBITAL  Final   Special Requests   Final    BOTTLES DRAWN AEROBIC AND ANAEROBIC Blood Culture adequate volume   Culture   Final    NO GROWTH 2 DAYS Performed at [facility], [place] [address], [place], [place] [address]    Report Status PENDING  Incomplete  Culture, blood (routine x 2)     Status: None (Preliminary result)   Collection Time: 09/11/17  9:30 PM  Result Value Ref Range Status   Specimen Description BLOOD RIGHT HAND  Final   Special Requests IN PEDIATRIC BOTTLE Blood Culture adequate volume  Final   Culture  Setup Time   Final    GRAM POSITIVE COCCI IN CLUSTERS IN PEDIATRIC BOTTLE CRITICAL RESULT CALLED TO, READ BACK BY AND VERIFIED WITH: Asa Saunas [id number] Performed at [facility], [place] [address] [place],  [address]    Culture [facility] POSITIVE COCCI  Final   Report Status PENDING  Incomplete  Blood Culture ID Panel (Reflexed)     Status:  Abnormal   Collection Time: 09/11/17  9:30 PM  Result Value Ref Range Status   Enterococcus species NOT DETECTED NOT DETECTED Final   Vancomycin resistance NOT DETECTED NOT  DETECTED Final   Listeria monocytogenes NOT DETECTED NOT DETECTED Final   Staphylococcus species DETECTED (A) NOT DETECTED Final    Comment: CRITICAL RESULT CALLED TO, READ BACK BY AND VERIFIED WITH: T DANG PHARMD 1833 09/12/17 A BROWNING    Staphylococcus aureus DETECTED (A) NOT DETECTED Final    Comment: Methicillin (oxacillin) susceptible Staphylococcus aureus (MSSA). Preferred therapy is anti staphylococcal beta lactam antibiotic (Cefazolin or Nafcillin), unless clinically contraindicated. CRITICAL RESULT CALLED TO, READ BACK BY AND VERIFIED WITH: T DANG PHARMD 8891 09/12/17 A BROWNING    Methicillin resistance NOT DETECTED NOT DETECTED Final   Streptococcus species NOT DETECTED NOT DETECTED Final   Streptococcus agalactiae NOT DETECTED NOT DETECTED Final   Streptococcus pneumoniae NOT DETECTED NOT DETECTED Final   Streptococcus pyogenes NOT DETECTED NOT DETECTED Final   Acinetobacter baumannii NOT DETECTED NOT DETECTED Final   Enterobacteriaceae species NOT DETECTED NOT DETECTED Final   Enterobacter cloacae complex NOT DETECTED NOT DETECTED Final   Escherichia coli NOT DETECTED NOT DETECTED Final   Klebsiella oxytoca NOT DETECTED NOT DETECTED Final   Klebsiella pneumoniae NOT DETECTED NOT DETECTED Final   Proteus species NOT DETECTED NOT DETECTED Final   Serratia marcescens NOT DETECTED NOT DETECTED Final   Carbapenem resistance NOT DETECTED NOT DETECTED Final   Haemophilus influenzae NOT DETECTED NOT DETECTED Final   Neisseria meningitidis NOT DETECTED NOT DETECTED Final   Pseudomonas aeruginosa NOT DETECTED NOT DETECTED Final   Candida albicans NOT DETECTED NOT DETECTED Final   Candida glabrata NOT DETECTED NOT DETECTED Final   Candida krusei NOT DETECTED NOT DETECTED Final   Candida parapsilosis NOT  DETECTED NOT DETECTED Final   Candida tropicalis NOT DETECTED NOT DETECTED Final    Comment: Performed at [facility], [place]. [address], [place], [place] [address]     Terri Piedra, [facility] [phone] Pager  09/13/2017  9:23 AM

## 2017-09-13 NOTE — Progress Notes (Signed)
Notified MD Aggie Moats that pt had temp of 100.8 oral  0900 and 102.1 axillary at 1200. Pt was given tylenol at 1200. Also notified MD that  k =3.3 Hgb= 8.7.   Awaiting orders.

## 2017-09-13 NOTE — Progress Notes (Signed)
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 CT ordered by admitting physician was refused by patient will reattempt Narcan as needed Decr prn pain med dose so pt can participate in decision making tomorrow  Convulsions Likely related to drug use We will consider MRI after head CT Seizure precautions PRN Ativan  Thrombocytopenia Likely due to sepsis We will monitor SCDs for DVT prophylaxis Tobacco abuse Nicotine patch  Right shoulder pain this is chronic  x-ray shoulder shows no fracture We will consider further imaging  Polysubstance abuse Patient is high risk for substance abuse recurrence Is requesting methadone which based on patient's behavior is a plan that is not optimal Case management to help patient with resources outpatient Appearance interested in getting patient into rehab  Code Status: FC Family Communication: parents at bedside Disposition Plan: tbd   Consultants:  ID consulted  Procedures:  ---  Antibiotics:  Vanc 2/10-->P  HPI/Subjective: Pt is drowsy.  Denies pain.  Objective: Vitals:   09/13/17 1400 09/13/17 1817  BP:  137/87  Pulse:  (!) 124  Resp:  (!) 24  Temp: 98.7 F (37.1 C) 98.4 F (36.9 C)  SpO2:  98%    Intake/Output Summary (Last 24 hours) at 09/13/2017 1925 Last data filed at 09/13/2017 1752 Gross per 24 hour  Intake 3550.75 ml  Output 400 ml  Net 3150.75 ml   Filed Weights   09/12/17 0545 09/12/17 2240  Weight: 64 kg (141 lb 1.5 oz) 64 kg (141 lb 2 oz)     Exam:   General:  NCAT, NAD, somnolence, GCS 13  Cardiovascular: Tachycardia, no MRG  Respiratory: CTAB, nl wob  Abdomen: ND, NT, BS+  Musculoskeletal: moving all extr  Skin: petechiae on feet  Data Reviewed: Basic Metabolic Panel: Recent Labs  Lab 09/10/17 1811 09/11/17 0041 09/11/17 1826 09/11/17 2100 09/12/17 0629 09/13/17 0914  NA 130* 130* 130*  --  135 132*  K 3.8 2.9* 3.4*  --  3.5 3.3*  CL 91* 96* 96*  --  102 102  CO2 22 22 23   --  22 19*  GLUCOSE 99 129* 110*  --  112* 143*  BUN 33* 24* 16  --  11 9  CREATININE 1.40* 1.25* 1.09  --  0.84 0.99  CALCIUM 8.2* 7.5* 7.7*  --  7.5* 7.1*  MG  --   --   --  1.7  --   --    Liver Function Tests: Recent Labs  Lab 09/10/17 1811 09/11/17 0041 09/11/17 1826  AST 45* 37 36  ALT 42 34 28  ALKPHOS 148* 83 76  BILITOT 1.2 0.9 1.0  PROT 6.6 5.5* 5.7*  ALBUMIN 2.6* 2.1* 1.9*   No results for input(s): LIPASE, AMYLASE in the last 168 hours. No results for input(s): AMMONIA in the last 168 hours. CBC: Recent Labs  Lab 09/10/17 1811 09/11/17 0041 09/11/17 1826 09/12/17 0629 09/13/17 0914  WBC 11.5* 9.9 16.1* 9.3 8.1  NEUTROABS  --   --  12.7*  --  5.8  HGB 14.2 12.2* 9.1* 9.7* 8.7*  HCT 40.1 34.7* 26.9* 28.7* 25.3*  MCV 92.2 92.3 92.1 93.5 93.7  PLT 49* 54*  54* 73* 52* 52*   Cardiac Enzymes: No results for input(s): CKTOTAL, CKMB, CKMBINDEX, TROPONINI in the last 168 hours. BNP (last 3 results) No results for input(s): BNP in the last 8760 hours.  ProBNP (last 3 results) No results for input(s): PROBNP in the last 8760 hours.  CBG: Recent Labs  Lab 09/11/17 1218  GLUCAP 97    Recent Results (from the past 240 hour(s))  Blood culture (routine x 2)     Status: Abnormal   Collection Time: 09/10/17  7:45 PM  Result Value Ref Range Status   Specimen Description BLOOD RIGHT HAND  Final   Special Requests IN PEDIATRIC BOTTLE Blood Culture adequate volume  Final   Culture  Setup Time   Final     GRAM POSITIVE COCCI IN CLUSTERS IN PEDIATRIC BOTTLE CRITICAL RESULT CALLED TO, READ BACK BY AND VERIFIED WITH: T RUDISILL,PHARMD AT 2979 09/11/17 BY L BENFIELD Performed at [facility], [place] [address], [place], [place] [address]    Culture STAPHYLOCOCCUS AUREUS (A)  Final   Report Status 09/13/2017 FINAL  Final   Organism ID, Bacteria STAPHYLOCOCCUS AUREUS  Final      Susceptibility   Staphylococcus aureus - MIC*    CIPROFLOXACIN <=0.5 SENSITIVE Sensitive     ERYTHROMYCIN <=0.25 SENSITIVE Sensitive     GENTAMICIN <=0.5 SENSITIVE Sensitive     OXACILLIN 0.5 SENSITIVE Sensitive     TETRACYCLINE <=1 SENSITIVE Sensitive     VANCOMYCIN 1 SENSITIVE Sensitive     TRIMETH/SULFA <=10 SENSITIVE Sensitive     CLINDAMYCIN <=0.25 SENSITIVE Sensitive     RIFAMPIN <=0.5 SENSITIVE Sensitive     Inducible Clindamycin NEGATIVE Sensitive     * STAPHYLOCOCCUS AUREUS  Blood Culture ID Panel (Reflexed)     Status: Abnormal   Collection Time: 09/10/17  7:45 PM  Result Value Ref Range Status   Enterococcus species NOT DETECTED NOT DETECTED Final   Vancomycin resistance NOT DETECTED NOT DETECTED Final   Listeria monocytogenes NOT DETECTED NOT DETECTED Final   Staphylococcus species DETECTED (A) NOT DETECTED Final    Comment: CRITICAL RESULT CALLED TO, READ BACK BY AND VERIFIED WITH: T RUDISILL,PHARMD AT 9417 09/11/17 BY L BENFIELD    Staphylococcus aureus DETECTED (A) NOT DETECTED Final    Comment: CRITICAL RESULT CALLED TO, READ BACK BY AND VERIFIED WITH: T RUDISILL,PHARMD AT 4081 09/11/17 BY L BENFIELD Methicillin (oxacillin) susceptible Staphylococcus aureus (MSSA). Preferred therapy is anti staphylococcal beta lactam antibiotic (Cefazolin or Nafcillin), unless clinically contraindicated.    Methicillin resistance NOT DETECTED NOT DETECTED Final   Streptococcus species NOT DETECTED NOT DETECTED Final   Streptococcus agalactiae NOT DETECTED NOT DETECTED Final   Streptococcus pneumoniae NOT  DETECTED NOT DETECTED Final   Streptococcus pyogenes NOT DETECTED NOT DETECTED Final   Acinetobacter baumannii NOT DETECTED NOT DETECTED Final   Enterobacteriaceae species NOT DETECTED NOT DETECTED Final   Enterobacter cloacae complex NOT DETECTED NOT DETECTED Final   Escherichia coli NOT DETECTED NOT DETECTED Final   Klebsiella oxytoca NOT DETECTED NOT DETECTED Final   Klebsiella pneumoniae NOT DETECTED NOT DETECTED Final   Proteus species NOT DETECTED NOT DETECTED Final   Serratia marcescens NOT DETECTED NOT DETECTED Final   Carbapenem resistance NOT DETECTED NOT DETECTED Final   Haemophilus influenzae NOT DETECTED NOT DETECTED Final   Neisseria meningitidis NOT DETECTED NOT DETECTED Final   Pseudomonas aeruginosa NOT DETECTED NOT DETECTED Final  Candida albicans NOT DETECTED NOT DETECTED Final   Candida glabrata NOT DETECTED NOT DETECTED Final   Candida krusei NOT DETECTED NOT DETECTED Final   Candida parapsilosis NOT DETECTED NOT DETECTED Final   Candida tropicalis NOT DETECTED NOT DETECTED Final    Comment: Performed at [facility], [place] [address], [place], [place] [address]  Blood culture (routine x 2)     Status: Abnormal   Collection Time: 09/10/17  9:50 PM  Result Value Ref Range Status   Specimen Description BLOOD LEFT ANTECUBITAL  Final   Special Requests IN PEDIATRIC BOTTLE Blood Culture adequate volume  Final   Culture  Setup Time   Final    GRAM POSITIVE COCCI IN PEDIATRIC BOTTLE CRITICAL VALUE NOTED.  VALUE IS CONSISTENT WITH PREVIOUSLY REPORTED AND CALLED VALUE.    Culture (A)  Final    STAPHYLOCOCCUS AUREUS SUSCEPTIBILITIES PERFORMED ON PREVIOUS CULTURE WITHIN THE LAST 5 DAYS. Performed at [facility], [place] [address], [place], [place] [address]    Report Status 09/13/2017 FINAL  Final  Culture, blood (routine x 2)     Status: None (Preliminary result)   Collection Time: 09/11/17  6:35 PM  Result Value Ref Range Status   Specimen Description BLOOD  LEFT ANTECUBITAL  Final   Special Requests   Final    BOTTLES DRAWN AEROBIC AND ANAEROBIC Blood Culture adequate volume   Culture   Final    NO GROWTH 2 DAYS Performed at [facility], [place] [address] [place], [place] [address]    Report Status PENDING  Incomplete  Culture, blood (routine x 2)     Status: Abnormal (Preliminary result)   Collection Time: 09/11/17  9:30 PM  Result Value Ref Range Status   Specimen Description BLOOD RIGHT HAND  Final   Special Requests IN PEDIATRIC BOTTLE Blood Culture adequate volume  Final   Culture  Setup Time   Final    GRAM POSITIVE COCCI IN CLUSTERS IN PEDIATRIC BOTTLE CRITICAL RESULT CALLED TO, READ BACK BY AND VERIFIED WITH: Asa Saunas [phone] [id number] Performed at [facility], [place] [address], [place], [place] [address]    Culture STAPHYLOCOCCUS AUREUS (A)  Final   Report Status PENDING  Incomplete  Blood Culture ID Panel (Reflexed)     Status: Abnormal   Collection Time: 09/11/17  9:30 PM  Result Value Ref Range Status   Enterococcus species NOT DETECTED NOT DETECTED Final   Vancomycin resistance NOT DETECTED NOT DETECTED Final   Listeria monocytogenes NOT DETECTED NOT DETECTED Final   Staphylococcus species DETECTED (A) NOT DETECTED Final    Comment: CRITICAL RESULT CALLED TO, READ BACK BY AND VERIFIED WITH: T DANG PHARMD 1833 09/12/17 A BROWNING    Staphylococcus aureus DETECTED (A) NOT DETECTED Final    Comment: Methicillin (oxacillin) susceptible Staphylococcus aureus (MSSA). Preferred therapy is anti staphylococcal beta lactam antibiotic (Cefazolin or Nafcillin), unless clinically contraindicated. CRITICAL RESULT CALLED TO, READ BACK BY AND VERIFIED WITH: T DANG PHARMD 7412 09/12/17 A BROWNING    Methicillin resistance NOT DETECTED NOT DETECTED Final   Streptococcus species NOT DETECTED NOT DETECTED Final   Streptococcus agalactiae NOT DETECTED NOT DETECTED Final   Streptococcus pneumoniae NOT DETECTED NOT DETECTED  Final   Streptococcus pyogenes NOT DETECTED NOT DETECTED Final   Acinetobacter baumannii NOT DETECTED NOT DETECTED Final   Enterobacteriaceae species NOT DETECTED NOT DETECTED Final   Enterobacter cloacae complex NOT DETECTED NOT DETECTED Final  Escherichia coli NOT DETECTED NOT DETECTED Final   Klebsiella oxytoca NOT DETECTED NOT DETECTED Final   Klebsiella pneumoniae NOT DETECTED NOT DETECTED Final   Proteus species NOT DETECTED NOT DETECTED Final   Serratia marcescens NOT DETECTED NOT DETECTED Final   Carbapenem resistance NOT DETECTED NOT DETECTED Final   Haemophilus influenzae NOT DETECTED NOT DETECTED Final   Neisseria meningitidis NOT DETECTED NOT DETECTED Final   Pseudomonas aeruginosa NOT DETECTED NOT DETECTED Final   Candida albicans NOT DETECTED NOT DETECTED Final   Candida glabrata NOT DETECTED NOT DETECTED Final   Candida krusei NOT DETECTED NOT DETECTED Final   Candida parapsilosis NOT DETECTED NOT DETECTED Final   Candida tropicalis NOT DETECTED NOT DETECTED Final    Comment: Performed at [facility], [place] [address] [place], [place] [address]     Studies: Dg Shoulder Right  Result Date: 09/11/2017 CLINICAL DATA:  Pain. EXAM: RIGHT SHOULDER - 2+ VIEW COMPARISON:  None. FINDINGS: Frontal and Y scapular images were obtained. No fracture or dislocation. Joint spaces appear normal. No erosive change or bony destruction. Visualized right lung clear. IMPRESSION: No fracture or dislocation.  No evident arthropathy. Electronically Signed   By: Lowella Grip III M.D.   On: 09/11/2017 21:12   Ct Head Wo Contrast  Result Date: 09/12/2017 CLINICAL DATA:  Seizure activity, IV drug use. EXAM: CT HEAD WITHOUT CONTRAST TECHNIQUE: Contiguous axial images were obtained from the base of the skull through the vertex without intravenous contrast. COMPARISON:  09/10/2016 FINDINGS: Brain: Ventricles are within normal. CSF spaces are unremarkable. There is mild stable prominence of  the cisterna magna. No mass, mass effect, shift of midline structures or acute hemorrhage. No evidence of acute infarction. Vascular: No hyperdense vessel or unexpected calcification. Skull: Normal. Negative for fracture or focal lesion. Sinuses/Orbits: No acute finding. Other: None. IMPRESSION: No acute findings. Electronically Signed   By: Marin Olp M.D.   On: 09/12/2017 11:53   Mr Hip Right Wo Contrast  Result Date: 09/12/2017 CLINICAL DATA:  32 year old male admitted with progressive joint pain since last week. Patient has been injecting meth. Pain has been migratory with bilateral ankle/feet, knee and hip involvement as well as right shoulder. Question septic arthritis. EXAM: MR OF THE RIGHT HIP WITHOUT CONTRAST TECHNIQUE: Multiplanar, multisequence MR imaging was performed. No intravenous contrast was administered. COMPARISON:  Plain radiographs 09/11/2016 FINDINGS: Bones: Osteophytes are seen about the left femoral head-neck junction. No flattening or subchondral edema of the femoral heads. No marrow edema of the femoral heads. No fracture, joint dislocation nor suspicious osseous lesions. Articular cartilage and labrum Articular cartilage: No focal chondral defects of the left acetabular or femoral head cartilage. Labrum:  No labral tear is identified about either hip. Joint or bursal effusion Joint effusion: Small bilateral hip joint effusions without significant synovial proliferation. Bursae: None Muscles and tendons Muscles and tendons: Mild nonspecific intramuscular edema involving the pectineus, adductor and obturator externus muscles about both hips and left obturator internus. Mild gluteal edema is also noted bilaterally. Other findings Miscellaneous: Diffuse soft tissue anasarca about the included pelvis and both hips. Trace presacral edema and fluid. IMPRESSION: 1. Mild soft tissue anasarca about the pelvis and both hips. No drainable fluid collection or abscess. 2. Nonspecific mild  intramuscular edema about both hips question myositis. 3. Small bilateral hip joint effusions without frank bone destruction or marrow signal abnormalities to suggest a septic joint. If clinical concern persists for septic arthritis, joint aspiration of the affected side  may prove useful. Electronically Signed   By: Ashley Royalty M.D.   On: 09/12/2017 23:31   [place] Rt Lower Extrem Ltd Soft Tissue Non Vascular  Result Date: 09/13/2017 CLINICAL DATA:  Palpable abnormality in right calf. EXAM: ULTRASOUND right LOWER EXTREMITY LIMITED TECHNIQUE: Ultrasound examination of the lower extremity soft tissues was performed in the area of clinical concern. COMPARISON:  None. FINDINGS: No definite mass or fluid collection or cyst is seen in the area of palpable concern in the posterior aspect of the right calf. There does appear to be thrombosed superficial vein in this area. IMPRESSION: Probable thrombosed superficial vein seen in area of palpable concern in the right calf. No mass, fluid collection or cyst is noted. Electronically Signed   By: Marijo Conception, M.D.   On: 09/13/2017 12:39   Dg Hip Unilat With Pelvis 2-3 Views Right  Result Date: 09/11/2017 CLINICAL DATA:  Pain EXAM: DG HIP (WITH OR WITHOUT PELVIS) 2-3V RIGHT COMPARISON:  None. FINDINGS: Frontal pelvis as well as frontal and lateral right hip images were obtained. There is no fracture or dislocation. Joint spaces appear normal. No erosive change or bony destruction. IMPRESSION: No fracture or dislocation.  No evident arthropathy. Electronically Signed   By: Lowella Grip III M.D.   On: 09/11/2017 21:13    Scheduled Meds: . acetaminophen  650 mg Rectal Once  . bupivacaine  10 mL Infiltration Once  . lidocaine (PF)  5 mL Other Once  . nicotine  14 mg Transdermal Daily  . potassium chloride  30 mEq Oral BID   Continuous Infusions: .  ceFAZolin (ANCEF) IV Stopped (09/13/17 1500)  . dextrose 5 % and 0.45 % NaCl with KCl 20 mEq/L 125 mL/hr at  09/13/17 1752    Active Problems:   Heroin abuse (HCC)   Polysubstance abuse (HCC)   Severe sepsis ([place])   Bacteremia due to Staphylococcus aureus   Cigarette smoker   Hypokalemia   DIC (disseminated intravascular coagulation) ([place])   Staphylococcal arthritis of right shoulder (HCC)   Septic infrapatellar bursitis of left knee   Staphylococcal arthritis of left hip (HCC)   Staphylococcal arthritis of right hip (HCC)   Acute bacterial endocarditis   Calf abscess    Time spent: [facility] Pager [place]. If 7PM-7AM, please contact night-coverage at [email or web address], password [email or web address] 09/13/2017, 7:25 PM  LOS: 2 days

## 2017-09-13 NOTE — Progress Notes (Signed)
Patient ID: Gary Hicks, male   DOB: Oct 16, 1985, 32 y.o.   MRN: [id number]   LOS: 2 days   Subjective: Pt asleep, resistant to wakening.   Objective: Vital signs in last 24 hours: Temp:  [97.7 F (36.5 C)-102.9 F (39.4 C)] 98.9 F (37.2 C) (02/12 0530) Pulse Rate:  [103-141] 136 (02/12 0530) Resp:  [18-19] 19 (02/12 0530) BP: (117-125)/(56-84) 117/69 (02/12 0530) SpO2:  [98 %-100 %] 98 % (02/12 0530) Weight:  [64 kg (141 lb 2 oz)] 64 kg (141 lb 2 oz) (02/11 2240) Last BM Date: 09/12/17   Laboratory  CBC Recent Labs    09/11/17 1826 09/12/17 0629  WBC 16.1* 9.3  HGB 9.1* 9.7*  HCT 26.9* 28.7*  PLT 73* 52*   BMET Recent Labs    09/11/17 1826 09/12/17 0629  NA 130* 135  K 3.4* 3.5  CL 96* 102  CO2 23 22  GLUCOSE 110* 112*  BUN 16 11  CREATININE 1.09 0.84  CALCIUM 7.7* 7.5*     Physical Exam General appearance: no distress  LLE Ankle rash, erythema on knee improved  Able to range knee with only slight moan  No knee or ankle effusion  Sens DPN, SPN, TN could not assess  Motor EHL, ext, flex, evers could not assess  DP 2+, PT 1+, 1+ edema  RLE Ropy subcutaneous lesions in proximal calf  Nontender   Assessment/Plan: Joint pain -- Suspect this is all 2/2 endocarditis. No e/o septic hip joints on MRI, would not tolerate further MRI for other joints. Would recommend [place] of RLE. Otherwise orthopedics will sign off, please call if condition changes.    Lisette Abu, PA-C Orthopedic Surgery [phone] 09/13/2017

## 2017-09-13 NOTE — Progress Notes (Signed)
Pt HR sustaining in 130s. MD made aware. MD ordered EKG.

## 2017-09-14 DIAGNOSIS — R52 Pain, unspecified: Secondary | ICD-10-CM | POA: Insufficient documentation

## 2017-09-14 DIAGNOSIS — I82409 Acute embolism and thrombosis of unspecified deep veins of unspecified lower extremity: Secondary | ICD-10-CM

## 2017-09-14 DIAGNOSIS — B1920 Unspecified viral hepatitis C without hepatic coma: Secondary | ICD-10-CM

## 2017-09-14 LAB — COMPREHENSIVE METABOLIC PANEL
ALBUMIN: 1.4 g/dL — AB (ref 3.5–5.0)
ALK PHOS: 91 U/L (ref 38–126)
ALT: 46 U/L (ref 17–63)
AST: 97 U/L — AB (ref 15–41)
Anion gap: 7 (ref 5–15)
BILIRUBIN TOTAL: 0.7 mg/dL (ref 0.3–1.2)
BUN: 9 mg/dL (ref 6–20)
CALCIUM: 7 mg/dL — AB (ref 8.9–10.3)
CO2: 19 mmol/L — AB (ref 22–32)
Chloride: 104 mmol/L (ref 101–111)
Creatinine, Ser: 0.81 mg/dL (ref 0.61–1.24)
GFR calc Af Amer: >60 mL/min (ref >60)
GFR calc non Af Amer: >60 mL/min (ref >60)
GLUCOSE: 105 mg/dL — AB (ref 65–99)
Potassium: 4.2 mmol/L (ref 3.5–5.1)
SODIUM: 130 mmol/L — AB (ref 135–145)
TOTAL PROTEIN: 5.2 g/dL — AB (ref 6.5–8.1)

## 2017-09-14 LAB — CULTURE, BLOOD (ROUTINE X 2): SPECIAL REQUESTS: ADEQUATE

## 2017-09-14 LAB — RHEUMATOID FACTOR: Rhuematoid fact SerPl-aCnc: 10.6 IU/mL (ref 0.0–13.9)

## 2017-09-14 LAB — HEPATITIS C ANTIBODY: HCV Ab: >11 s/co ratio — ABNORMAL HIGH (ref 0.0–0.9)

## 2017-09-14 LAB — CBC WITH DIFFERENTIAL/PLATELET
BASOS ABS: 0 10*3/uL (ref 0.0–0.1)
BASOS PCT: 0 %
Eosinophils Absolute: 0.1 10*3/uL (ref 0.0–0.7)
Eosinophils Relative: 1 %
HEMATOCRIT: 23.9 % — AB (ref 39.0–52.0)
HEMOGLOBIN: 8 g/dL — AB (ref 13.0–17.0)
Lymphocytes Relative: 8 %
Lymphs Abs: 1.1 10*3/uL (ref 0.7–4.0)
MCH: 31.3 pg (ref 26.0–34.0)
MCHC: 33.5 g/dL (ref 30.0–36.0)
MCV: 93.4 fL (ref 78.0–100.0)
MONOS PCT: 7 %
Monocytes Absolute: 1 10*3/uL (ref 0.1–1.0)
NEUTROS ABS: 11.7 10*3/uL — AB (ref 1.7–7.7)
NEUTROS PCT: 84 %
Platelets: 66 10*3/uL — ABNORMAL LOW (ref 150–400)
RBC: 2.56 MIL/uL — AB (ref 4.22–5.81)
RDW: 13.8 % (ref 11.5–15.5)
WBC: 13.9 10*3/uL — AB (ref 4.0–10.5)

## 2017-09-14 LAB — CALCIUM, IONIZED: CALCIUM, IONIZED, SERUM: 4.5 mg/dL (ref 4.5–5.6)

## 2017-09-14 LAB — HEPATITIS C ANTIBODY (REFLEX): HCV Ab: >11 s/co ratio — ABNORMAL HIGH (ref 0.0–0.9)

## 2017-09-14 LAB — HEPATITIS B SURFACE ANTIGEN: Hepatitis B Surface Ag: NEGATIVE

## 2017-09-14 LAB — COMMENT2 - HEP PANEL

## 2017-09-14 MED ORDER — HYDROMORPHONE HCL 1 MG/ML IJ SOLN
1.0000 mg | Freq: Once | INTRAMUSCULAR | Status: AC
  Administered 2017-09-14: 1 mg via INTRAVENOUS
  Filled 2017-09-14: qty 1

## 2017-09-14 MED ORDER — SODIUM CHLORIDE 0.9 % IV SOLN
INTRAVENOUS | Status: DC
  Administered 2017-09-14 – 2017-09-22 (×13): via INTRAVENOUS
  Administered 2017-09-23: 1000 mL via INTRAVENOUS
  Administered 2017-09-24 – 2017-09-27 (×3): via INTRAVENOUS

## 2017-09-14 MED ORDER — HYDROCODONE-ACETAMINOPHEN 5-325 MG PO TABS
2.0000 | ORAL_TABLET | Freq: Once | ORAL | Status: AC
  Administered 2017-09-14: 2 via ORAL
  Filled 2017-09-14 (×2): qty 2

## 2017-09-14 MED ORDER — HYDROMORPHONE HCL 1 MG/ML IJ SOLN
1.0000 mg | Freq: Three times a day (TID) | INTRAMUSCULAR | Status: DC | PRN
  Administered 2017-09-14 – 2017-09-15 (×4): 1 mg via INTRAVENOUS
  Filled 2017-09-14 (×4): qty 1

## 2017-09-14 MED ORDER — LORAZEPAM 2 MG/ML IJ SOLN
1.0000 mg | Freq: Four times a day (QID) | INTRAMUSCULAR | Status: DC | PRN
  Administered 2017-09-14 – 2017-09-16 (×6): 1 mg via INTRAVENOUS
  Filled 2017-09-14 (×6): qty 1

## 2017-09-14 MED ORDER — ACETAMINOPHEN 500 MG PO TABS
1000.0000 mg | ORAL_TABLET | Freq: Four times a day (QID) | ORAL | Status: DC | PRN
  Administered 2017-09-14 – 2017-10-20 (×16): 1000 mg via ORAL
  Filled 2017-09-14 (×17): qty 2

## 2017-09-14 MED ORDER — SODIUM CHLORIDE 0.9 % IV BOLUS (SEPSIS)
500.0000 mL | Freq: Once | INTRAVENOUS | Status: AC
  Administered 2017-09-14: 500 mL via INTRAVENOUS

## 2017-09-14 NOTE — Progress Notes (Signed)
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 He continues to have fevers and generalized pain all throughout his body. There was an increase in his WBC to 13.9. MRI imaging pending for the right shoulder and left knee. Repeat blood cultures were drawn this morning and are in process. He was positive for Hepatitis C. Will obtain blood work and consider outpatient treatment once he is sober. We will continue the Ancef for the endocarditis and MSSA bacteremia which he appears to be tolerating well.   1. Continue Ancef. 2. Pain management per primary team. 3. Obtain Hep C RNA level and Genotype for possible outpatient treatment once sober.   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 acetaminophen  650 mg Rectal Once  . bupivacaine  10 mL Infiltration Once  . lidocaine (PF)  5 mL Other Once  . nicotine  14 mg Transdermal Daily  . potassium chloride  30 mEq Oral BID    SUBJECTIVE:  Continues to experience fevers with multiple temperatures near 103. Remains with no significant leukocytosis. Repeat blood cultures were drawn on 2/12 and are in process. Per nursing note two empty insulin syringes were found wrapped in the patient's gown by the Nurse Tech when he was getting cleaned up. Ultrasound of the lower extremity showed likely thrombosed vein.   Continues to have pain all over that he states is not  well controlled. Severity was enough to keep him up all night last night.   Allergies  Allergen Reactions  . Prednisone Hives  . Amoxicillin Hives     Review of Systems: Review of Systems  Constitutional: Positive for chills and fever.  Respiratory: Negative for cough, shortness of breath and wheezing.   Cardiovascular: Negative for palpitations and leg swelling.  Gastrointestinal: Negative for abdominal pain, constipation, diarrhea, nausea and vomiting.  Genitourinary: Negative for dysuria, frequency and urgency.  Skin: Negative for rash.      OBJECTIVE: Vitals:   09/13/17 2215 09/14/17 0614 09/14/17 0810 09/14/17 0926  BP: (!) 105/53 (!) 119/59  106/65  Pulse: (!) 115 (!) 109  (!) 117  Resp: (!) 22 (!) 22  (!) 22  Temp: 98.9 F (37.2 C) (!) 103.1 F (39.5 C) 100.1 F (37.8 C) 98.5 F (36.9 C)  TempSrc: Oral Oral Oral Oral  SpO2: 100% 100%  98%  Weight:      Height:       Body mass index is 20.84 kg/m.  Physical Exam  Constitutional: No distress.  Lying in bed; pleasant.   Cardiovascular: Exam reveals no gallop and no friction rub.  Murmur heard. Pulmonary/Chest: Effort normal and breath sounds normal. No respiratory distress. He has no wheezes. He has no rales. He exhibits no tenderness.  Abdominal: Soft. Bowel sounds are normal.  Neurological: He is alert.  Skin: Skin is warm and dry.  Lab Results Lab Results  Component Value Date   WBC 13.9 (H) 09/14/2017   HGB 8.0 (L) 09/14/2017   HCT 23.9 (L) 09/14/2017   MCV 93.4 09/14/2017   PLT 66 (L) 09/14/2017    Lab Results  Component Value Date   CREATININE 0.81 09/14/2017   BUN 9 09/14/2017   NA 130 (L) 09/14/2017   K 4.2 09/14/2017   CL 104 09/14/2017   CO2 19 (L) 09/14/2017    Lab Results  Component Value Date   ALT 46 09/14/2017   AST 97 (H) 09/14/2017   ALKPHOS 91 09/14/2017   BILITOT 0.7 09/14/2017     Microbiology: Recent Results (from the past 240 hour(s))  Blood culture (routine x  2)     Status: Abnormal   Collection Time: 09/10/17  7:45 PM  Result Value Ref Range Status   Specimen Description BLOOD RIGHT HAND  Final   Special Requests IN PEDIATRIC BOTTLE Blood Culture adequate volume  Final   Culture  Setup Time   Final    GRAM POSITIVE COCCI IN CLUSTERS IN PEDIATRIC BOTTLE CRITICAL RESULT CALLED TO, READ BACK BY AND VERIFIED WITH: T RUDISILL,PHARMD AT 6222 09/11/17 BY L BENFIELD Performed at [facility], [place] [address] [place], [place] [address]    Culture STAPHYLOCOCCUS AUREUS (A)  Final   Report Status 09/13/2017 FINAL  Final   Organism ID, Bacteria STAPHYLOCOCCUS AUREUS  Final      Susceptibility   Staphylococcus aureus - MIC*    CIPROFLOXACIN <=0.5 SENSITIVE Sensitive     ERYTHROMYCIN <=0.25 SENSITIVE Sensitive     GENTAMICIN <=0.5 SENSITIVE Sensitive     OXACILLIN 0.5 SENSITIVE Sensitive     TETRACYCLINE <=1 SENSITIVE Sensitive     VANCOMYCIN 1 SENSITIVE Sensitive     TRIMETH/SULFA <=10 SENSITIVE Sensitive     CLINDAMYCIN <=0.25 SENSITIVE Sensitive     RIFAMPIN <=0.5 SENSITIVE Sensitive     Inducible Clindamycin NEGATIVE Sensitive     * STAPHYLOCOCCUS AUREUS  Blood Culture ID Panel (Reflexed)     Status: Abnormal   Collection Time: 09/10/17  7:45 PM  Result Value Ref Range Status   Enterococcus species NOT DETECTED NOT DETECTED Final   Vancomycin resistance NOT DETECTED NOT DETECTED Final   Listeria monocytogenes NOT DETECTED NOT DETECTED Final   Staphylococcus species DETECTED (A) NOT DETECTED Final    Comment: CRITICAL RESULT CALLED TO, READ BACK BY AND VERIFIED WITH: T RUDISILL,PHARMD AT 2119 09/11/17 BY L BENFIELD    Staphylococcus aureus DETECTED (A) NOT DETECTED Final    Comment: CRITICAL RESULT CALLED TO, READ BACK BY AND VERIFIED WITH: T RUDISILL,PHARMD AT 4174 09/11/17 BY L BENFIELD Methicillin (oxacillin) susceptible Staphylococcus aureus (MSSA). Preferred therapy is anti staphylococcal beta lactam antibiotic (Cefazolin or  Nafcillin), unless clinically contraindicated.    Methicillin resistance NOT DETECTED NOT DETECTED Final   Streptococcus species NOT DETECTED NOT DETECTED Final   Streptococcus agalactiae NOT DETECTED NOT DETECTED Final   Streptococcus pneumoniae NOT DETECTED NOT DETECTED Final   Streptococcus pyogenes NOT DETECTED NOT DETECTED Final   Acinetobacter baumannii NOT DETECTED NOT DETECTED Final   Enterobacteriaceae species NOT DETECTED NOT DETECTED Final   Enterobacter cloacae complex NOT DETECTED NOT DETECTED Final   Escherichia coli NOT DETECTED NOT DETECTED Final   Klebsiella oxytoca NOT DETECTED NOT DETECTED Final   Klebsiella pneumoniae NOT DETECTED NOT DETECTED Final   Proteus species NOT DETECTED NOT DETECTED Final   Serratia marcescens NOT DETECTED NOT DETECTED Final  Carbapenem resistance NOT DETECTED NOT DETECTED Final   Haemophilus influenzae NOT DETECTED NOT DETECTED Final   Neisseria meningitidis NOT DETECTED NOT DETECTED Final   Pseudomonas aeruginosa NOT DETECTED NOT DETECTED Final   Candida albicans NOT DETECTED NOT DETECTED Final   Candida glabrata NOT DETECTED NOT DETECTED Final   Candida krusei NOT DETECTED NOT DETECTED Final   Candida parapsilosis NOT DETECTED NOT DETECTED Final   Candida tropicalis NOT DETECTED NOT DETECTED Final    Comment: Performed at [facility], [place] [address] [place], [place] [address]  Blood culture (routine x 2)     Status: Abnormal   Collection Time: 09/10/17  9:50 PM  Result Value Ref Range Status   Specimen Description BLOOD LEFT ANTECUBITAL  Final   Special Requests IN PEDIATRIC BOTTLE Blood Culture adequate volume  Final   Culture  Setup Time   Final    GRAM POSITIVE COCCI IN PEDIATRIC BOTTLE CRITICAL VALUE NOTED.  VALUE IS CONSISTENT WITH PREVIOUSLY REPORTED AND CALLED VALUE.    Culture (A)  Final    STAPHYLOCOCCUS AUREUS SUSCEPTIBILITIES PERFORMED ON PREVIOUS CULTURE WITHIN THE LAST 5 DAYS. Performed at [facility], [place] [address] [place], [place] [address]    Report Status 09/13/2017 FINAL  Final  Culture, blood (routine x 2)     Status: None (Preliminary result)   Collection Time: 09/11/17  6:35 PM  Result Value Ref Range Status   Specimen Description BLOOD LEFT ANTECUBITAL  Final   Special Requests   Final    BOTTLES DRAWN AEROBIC AND ANAEROBIC Blood Culture adequate volume   Culture   Final    NO GROWTH 2 DAYS Performed at [facility], [place] [address], [place], [place] [address]    Report Status PENDING  Incomplete  Culture, blood (routine x 2)     Status: Abnormal   Collection Time: 09/11/17  9:30 PM  Result Value Ref Range Status   Specimen Description BLOOD RIGHT HAND  Final   Special Requests IN PEDIATRIC BOTTLE Blood Culture adequate volume  Final   Culture  Setup Time   Final    GRAM POSITIVE COCCI IN CLUSTERS IN PEDIATRIC BOTTLE CRITICAL RESULT CALLED TO, READ BACK BY AND VERIFIED WITH: PHARMD B MARTIN [id number] MLM    Culture (A)  Final    STAPHYLOCOCCUS AUREUS SUSCEPTIBILITIES PERFORMED ON PREVIOUS CULTURE WITHIN THE LAST 5 DAYS. Performed at [facility], [place] [address], [place], [place] [address]    Report Status 09/14/2017 FINAL  Final  Blood Culture ID Panel (Reflexed)     Status: Abnormal   Collection Time: 09/11/17  9:30 PM  Result Value Ref Range Status   Enterococcus species NOT DETECTED NOT DETECTED Final   Vancomycin resistance NOT DETECTED NOT DETECTED Final   Listeria monocytogenes NOT DETECTED NOT DETECTED Final   Staphylococcus species DETECTED (A) NOT DETECTED Final    Comment: CRITICAL RESULT CALLED TO, READ BACK BY AND VERIFIED WITH: T DANG PHARMD 1833 09/12/17 A BROWNING    Staphylococcus aureus DETECTED (A) NOT DETECTED Final    Comment: Methicillin (oxacillin) susceptible Staphylococcus aureus (MSSA). Preferred therapy is anti staphylococcal beta lactam antibiotic (Cefazolin or Nafcillin), unless clinically  contraindicated. CRITICAL RESULT CALLED TO, READ BACK BY AND VERIFIED WITH: T DANG PHARMD 1833 09/12/17 A BROWNING    Methicillin resistance NOT DETECTED NOT DETECTED Final   Streptococcus species NOT DETECTED NOT DETECTED Final   Streptococcus agalactiae NOT DETECTED NOT DETECTED  Final   Streptococcus pneumoniae NOT DETECTED NOT DETECTED Final   Streptococcus pyogenes NOT DETECTED NOT DETECTED Final   Acinetobacter baumannii NOT DETECTED NOT DETECTED Final   Enterobacteriaceae species NOT DETECTED NOT DETECTED Final   Enterobacter cloacae complex NOT DETECTED NOT DETECTED Final   Escherichia coli NOT DETECTED NOT DETECTED Final   Klebsiella oxytoca NOT DETECTED NOT DETECTED Final   Klebsiella pneumoniae NOT DETECTED NOT DETECTED Final   Proteus species NOT DETECTED NOT DETECTED Final   Serratia marcescens NOT DETECTED NOT DETECTED Final   Carbapenem resistance NOT DETECTED NOT DETECTED Final   Haemophilus influenzae NOT DETECTED NOT DETECTED Final   Neisseria meningitidis NOT DETECTED NOT DETECTED Final   Pseudomonas aeruginosa NOT DETECTED NOT DETECTED Final   Candida albicans NOT DETECTED NOT DETECTED Final   Candida glabrata NOT DETECTED NOT DETECTED Final   Candida krusei NOT DETECTED NOT DETECTED Final   Candida parapsilosis NOT DETECTED NOT DETECTED Final   Candida tropicalis NOT DETECTED NOT DETECTED Final    Comment: Performed at [facility], [place] [address] [place], [place] [address]     Terri Piedra, [place] [facility] [phone] Pager  09/14/2017  11:05 AM

## 2017-09-14 NOTE — Progress Notes (Signed)
PROGRESS NOTE    Gary Hicks  FVC:[id number] DOB: 06-01-1986 DOA: 09/11/2017 PCP: Patient, [name] Per   Brief Narrative: This is a 32 year old man with active IV drug abuse, who presents with generalized pain. History is limited secondary to his acute illness including pain and fluctuations in alertness,his father is present at the bedside assists with history. The patient reports that the painhasbeen happening since the end of January, father reports that 2-3 days ago he was concerned the patient may have had a seizure because he was found unresponsive with some abnormal eye movements in the fetal position. The patient was incarcerated and released 3-4 weeks ago. Patient admits using IV methamphetamine as well as heroin in the past 24 hours prior to admission. Reports global polyarthralgias including his bilateral feet bilateral knees bilateral hips and shoulders. Also reports fevers and chills generalized myalgias. Acutely worsened today for what she calls father who brought him to seek medical attention.     Assessment & Plan:   Principal Problem:   Acute bacterial endocarditis Active Problems:   Heroin abuse ([place])   Polysubstance abuse ([place])   Severe sepsis ([place])   Bacteremia due to Staphylococcus aureus   Cigarette smoker   Hypokalemia   DIC (disseminated intravascular coagulation) (HCC)   Staphylococcal arthritis of right shoulder (H[facility])   Septic infrapatellar bursitis of left knee   Staphylococcal arthritis of left hip (HCC)   Staphylococcal arthritis of right hip (HCC)   Calf abscess   Pain   1-Tricuspid Valve endocarditis, MSSA bacteremia, endocarditis.  Continue with ancef.  Still spiking fever.  Blood culture 2-11 Staphylococcus MSSA Repeated blood culture 2-12 no growth.   2-Multiples joints pain; right shoulder MRI today.  -MRI left knee ordered.  -MRI hip;  Mild soft tissue anasarca about the pelvis and both hips. No drainable fluid collection or abscess.  Nonspecific mild intramuscular edema about both hips question myositis. Small bilateral hip joint effusions without frank bone destruction or marrow signal abnormalities to suggest a septic joint.  Will change dilaudid to Q 8 hours.   3-Sepsis secondary to number one. On IV fluids, IV antibiotics.   4-Heroin use;  There was some concern yesterday with syringe in his room.  IM teaching service consulted , to consider suboxone.   5-Convulsions Likely related to drug use We will consider MRI after head CT Seizure precautions PRN Ativan  6-Anxiety; PRN ativan.   7-Anemia; check anemia panel in am.  Thrombocytopenia. Monitor closely    DVT prophylaxis: will need to start heparin if hb stable.  Code Status: full code.  Family Communication: mother at bedside.  Disposition Plan: to be determine    Consultants:   ID  Ortho    Procedures:  ECHo;  - Left ventricle: The cavity size was normal. Systolic function was   normal. The estimated ejection fraction was in the range of 60%   to 65%. Wall motion was normal; there were no regional wall   motion abnormalities. - Left atrium: The atrium was mildly dilated. - Right ventricle: The cavity size was moderately dilated. Wall   thickness was normal. - Right atrium: The atrium was moderately dilated. - Tricuspid valve: There was a large, 1.1 cm (W) x 1.8 cm (L),   multilobulated, highly mobile vegetation on the right ventricular   aspect of the anterior leaflet. There was moderate-severe   regurgitation. - Pulmonary arteries: Systolic pressure was mildly increased. PA   peak pressure: 44 mm Hg (S). - Pericardium,  extracardiac: A trivial pericardial effusion was    identified posterior to the heart.  Antimicrobials: Ancef 2-11  Subjective: He is complaining of pain joint, hip, knee.  He is alert.   Objective: Vitals:   09/14/17 0614 09/14/17 0810 09/14/17 0926 09/14/17 1542  BP: (!) 119/59  106/65 135/68  Pulse: (!)  109  (!) 117 (!) 121  Resp: (!) 22  (!) 22 (!) 24  Temp: (!) 103.1 F (39.5 C) 100.1 F (37.8 C) 98.5 F (36.9 C) 100.3 F (37.9 C)  TempSrc: Oral Oral Oral Oral  SpO2: 100%  98% 98%  Weight:      Height:        Intake/Output Summary (Last 24 hours) at 09/14/2017 1638 Last data filed at 09/14/2017 0810 Gross per 24 hour  Intake 2955.75 ml  Output 1950 ml  Net 1005.75 ml   Filed Weights   09/12/17 0545 09/12/17 2240  Weight: 64 kg (141 lb 1.5 oz) 64 kg (141 lb 2 oz)    Examination:  General exam: Appears calm and comfortable , pale Respiratory system: Clear to auscultation. Respiratory effort normal. Cardiovascular system: S1 & S2 heard, RRR. No JVD, murmurs, rubs, gallops or clicks. No pedal edema. Gastrointestinal system: Abdomen is nondistended, soft and nontender. No organomegaly or masses felt. Normal bowel sounds heard. Central nervous system: Alert and oriented. No focal neurological deficits. Extremities: Symmetric 5 x 5 power. Left knee with redness, feet with rash  Skin: No rashes, lesions or ulcers   Data Reviewed: I have personally reviewed following labs and imaging studies  CBC: Recent Labs  Lab 09/11/17 0041 09/11/17 1826 09/12/17 0629 09/13/17 0914 09/14/17 0806  WBC 9.9 16.1* 9.3 8.1 13.9*  NEUTROABS  --  12.7*  --  5.8 11.7*  HGB 12.2* 9.1* 9.7* 8.7* 8.0*  HCT 34.7* 26.9* 28.7* 25.3* 23.9*  MCV 92.3 92.1 93.5 93.7 93.4  PLT 54*  54* 73* 52* 52* 66*   Basic Metabolic Panel: Recent Labs  Lab 09/11/17 0041 09/11/17 1826 09/11/17 2100 09/12/17 0629 09/13/17 0914 09/14/17 0806  NA 130* 130*  --  135 132* 130*  K 2.9* 3.4*  --  3.5 3.3* 4.2  CL 96* 96*  --  102 102 104  CO2 22 23  --  22 19* 19*  GLUCOSE 129* 110*  --  112* 143* 105*  BUN 24* 16  --  11 9 9   CREATININE 1.25* 1.09  --  0.84 0.99 0.81  CALCIUM 7.5* 7.7*  --  7.5* 7.1* 7.0*  MG  --   --  1.7  --   --   --    GFR: Estimated Creatinine Clearance: 119.6 mL/min (by C-G  formula based on SCr of 0.81 mg/dL). Liver Function Tests: Recent Labs  Lab 09/10/17 1811 09/11/17 0041 09/11/17 1826 09/14/17 0806  AST 45* 37 36 97*  ALT 42 34 28 46  ALKPHOS 148* 83 76 91  BILITOT 1.2 0.9 1.0 0.7  PROT 6.6 5.5* 5.7* 5.2*  ALBUMIN 2.6* 2.1* 1.9* 1.4*   No results for input(s): LIPASE, AMYLASE in the last 168 hours. No results for input(s): AMMONIA in the last 168 hours. Coagulation Profile: Recent Labs  Lab 09/11/17 0041 09/11/17 1826  INR 1.18 1.30   Cardiac Enzymes: No results for input(s): CKTOTAL, CKMB, CKMBINDEX, TROPONINI in the last 168 hours. BNP (last 3 results) No results for input(s): PROBNP in the last 8760 hours. HbA1C: No results for input(s): HGBA1C in the last 72  hours. CBG: Recent Labs  Lab 09/11/17 1218  GLUCAP 97   Lipid Profile: No results for input(s): CHOL, HDL, LDLCALC, TRIG, CHOLHDL, LDLDIRECT in the last 72 hours. Thyroid Function Tests: No results for input(s): TSH, T4TOTAL, FREET4, T3FREE, THYROIDAB in the last 72 hours. Anemia Panel: No results for input(s): VITAMINB12, FOLATE, FERRITIN, TIBC, IRON, RETICCTPCT in the last 72 hours. Sepsis Labs: Recent Labs  Lab 09/10/17 2005 09/11/17 1850 09/11/17 2127 09/12/17 0629  LATICACIDVEN 1.89 2.72* 2.8* 1.4    Recent Results (from the past 240 hour(s))  Blood culture (routine x 2)     Status: Abnormal   Collection Time: 09/10/17  7:45 PM  Result Value Ref Range Status   Specimen Description BLOOD RIGHT HAND  Final   Special Requests IN PEDIATRIC BOTTLE Blood Culture adequate volume  Final   Culture  Setup Time   Final    GRAM POSITIVE COCCI IN CLUSTERS IN PEDIATRIC BOTTLE CRITICAL RESULT CALLED TO, READ BACK BY AND VERIFIED WITH: T RUDISILL,PHARMD AT 1610 09/11/17 BY L BENFIELD Performed at [facility], [place] [address] [place], [place] [address]    Culture STAPHYLOCOCCUS AUREUS (A)  Final   Report Status 09/13/2017 FINAL  Final   Organism ID, Bacteria  STAPHYLOCOCCUS AUREUS  Final      Susceptibility   Staphylococcus aureus - MIC*    CIPROFLOXACIN <=0.5 SENSITIVE Sensitive     ERYTHROMYCIN <=0.25 SENSITIVE Sensitive     GENTAMICIN <=0.5 SENSITIVE Sensitive     OXACILLIN 0.5 SENSITIVE Sensitive     TETRACYCLINE <=1 SENSITIVE Sensitive     VANCOMYCIN 1 SENSITIVE Sensitive     TRIMETH/SULFA <=10 SENSITIVE Sensitive     CLINDAMYCIN <=0.25 SENSITIVE Sensitive     RIFAMPIN <=0.5 SENSITIVE Sensitive     Inducible Clindamycin NEGATIVE Sensitive     * STAPHYLOCOCCUS AUREUS  Blood Culture ID Panel (Reflexed)     Status: Abnormal   Collection Time: 09/10/17  7:45 PM  Result Value Ref Range Status   Enterococcus species NOT DETECTED NOT DETECTED Final   Vancomycin resistance NOT DETECTED NOT DETECTED Final   Listeria monocytogenes NOT DETECTED NOT DETECTED Final   Staphylococcus species DETECTED (A) NOT DETECTED Final    Comment: CRITICAL RESULT CALLED TO, READ BACK BY AND VERIFIED WITH: T RUDISILL,PHARMD AT 4098 09/11/17 BY L BENFIELD    Staphylococcus aureus DETECTED (A) NOT DETECTED Final    Comment: CRITICAL RESULT CALLED TO, READ BACK BY AND VERIFIED WITH: T RUDISILL,PHARMD AT 1191 09/11/17 BY L BENFIELD Methicillin (oxacillin) susceptible Staphylococcus aureus (MSSA). Preferred therapy is anti staphylococcal beta lactam antibiotic (Cefazolin or Nafcillin), unless clinically contraindicated.    Methicillin resistance NOT DETECTED NOT DETECTED Final   Streptococcus species NOT DETECTED NOT DETECTED Final   Streptococcus agalactiae NOT DETECTED NOT DETECTED Final   Streptococcus pneumoniae NOT DETECTED NOT DETECTED Final   Streptococcus pyogenes NOT DETECTED NOT DETECTED Final   Acinetobacter baumannii NOT DETECTED NOT DETECTED Final   Enterobacteriaceae species NOT DETECTED NOT DETECTED Final   Enterobacter cloacae complex NOT DETECTED NOT DETECTED Final   Escherichia coli NOT DETECTED NOT DETECTED Final   Klebsiella oxytoca NOT  DETECTED NOT DETECTED Final   Klebsiella pneumoniae NOT DETECTED NOT DETECTED Final   Proteus species NOT DETECTED NOT DETECTED Final   Serratia marcescens NOT DETECTED NOT DETECTED Final   Carbapenem resistance NOT DETECTED NOT DETECTED Final   Haemophilus influenzae NOT DETECTED NOT DETECTED Final   Neisseria meningitidis NOT DETECTED NOT DETECTED  Final   Pseudomonas aeruginosa NOT DETECTED NOT DETECTED Final   Candida albicans NOT DETECTED NOT DETECTED Final   Candida glabrata NOT DETECTED NOT DETECTED Final   Candida krusei NOT DETECTED NOT DETECTED Final   Candida parapsilosis NOT DETECTED NOT DETECTED Final   Candida tropicalis NOT DETECTED NOT DETECTED Final    Comment: Performed at [facility], [place] [address] [place], [place] [address]  Blood culture (routine x 2)     Status: Abnormal   Collection Time: 09/10/17  9:50 PM  Result Value Ref Range Status   Specimen Description BLOOD LEFT ANTECUBITAL  Final   Special Requests IN PEDIATRIC BOTTLE Blood Culture adequate volume  Final   Culture  Setup Time   Final    GRAM POSITIVE COCCI IN PEDIATRIC BOTTLE CRITICAL VALUE NOTED.  VALUE IS CONSISTENT WITH PREVIOUSLY REPORTED AND CALLED VALUE.    Culture (A)  Final    STAPHYLOCOCCUS AUREUS SUSCEPTIBILITIES PERFORMED ON PREVIOUS CULTURE WITHIN THE LAST 5 DAYS. Performed at [facility], [place] [address] [place], [place] [address]    Report Status 09/13/2017 FINAL  Final  Culture, blood (routine x 2)     Status: None (Preliminary result)   Collection Time: 09/11/17  6:35 PM  Result Value Ref Range Status   Specimen Description BLOOD LEFT ANTECUBITAL  Final   Special Requests   Final    BOTTLES DRAWN AEROBIC AND ANAEROBIC Blood Culture adequate volume   Culture   Final    NO GROWTH 3 DAYS Performed at [facility], [place] [address], [place], [place] [address]    Report Status PENDING  Incomplete  Culture, blood (routine x 2)     Status: Abnormal   Collection  Time: 09/11/17  9:30 PM  Result Value Ref Range Status   Specimen Description BLOOD RIGHT HAND  Final   Special Requests IN PEDIATRIC BOTTLE Blood Culture adequate volume  Final   Culture  Setup Time   Final    GRAM POSITIVE COCCI IN CLUSTERS IN PEDIATRIC BOTTLE CRITICAL RESULT CALLED TO, READ BACK BY AND VERIFIED WITH: PHARMD B MARTIN [id number]    Culture (A)  Final    STAPHYLOCOCCUS AUREUS SUSCEPTIBILITIES PERFORMED ON PREVIOUS CULTURE WITHIN THE LAST 5 DAYS. Performed at [facility], [place] [address], [place],  [address]    Report Status 09/14/2017 FINAL  Final  Blood Culture ID Panel (Reflexed)     Status: Abnormal   Collection Time: 09/11/17  9:30 PM  Result Value Ref Range Status   Enterococcus species NOT DETECTED NOT DETECTED Final   Vancomycin resistance NOT DETECTED NOT DETECTED Final   Listeria monocytogenes NOT DETECTED NOT DETECTED Final   Staphylococcus species DETECTED (A) NOT DETECTED Final    Comment: CRITICAL RESULT CALLED TO, READ BACK BY AND VERIFIED WITH: T DANG PHARMD 1833 09/12/17 A BROWNING    Staphylococcus aureus DETECTED (A) NOT DETECTED Final    Comment: Methicillin (oxacillin) susceptible Staphylococcus aureus (MSSA). Preferred therapy is anti staphylococcal beta lactam antibiotic (Cefazolin or Nafcillin), unless clinically contraindicated. CRITICAL RESULT CALLED TO, READ BACK BY AND VERIFIED WITH: T DANG PHARMD 1833 09/12/17 A BROWNING    Methicillin resistance NOT DETECTED NOT DETECTED Final   Streptococcus species NOT DETECTED NOT DETECTED Final   Streptococcus agalactiae NOT DETECTED NOT DETECTED Final   Streptococcus pneumoniae NOT DETECTED NOT DETECTED Final   Streptococcus pyogenes NOT DETECTED NOT DETECTED Final   Acinetobacter baumannii NOT DETECTED  NOT DETECTED Final   Enterobacteriaceae species NOT DETECTED NOT DETECTED Final   Enterobacter cloacae complex NOT DETECTED NOT DETECTED Final   Escherichia coli NOT DETECTED NOT  DETECTED Final   Klebsiella oxytoca NOT DETECTED NOT DETECTED Final   Klebsiella pneumoniae NOT DETECTED NOT DETECTED Final   Proteus species NOT DETECTED NOT DETECTED Final   Serratia marcescens NOT DETECTED NOT DETECTED Final   Carbapenem resistance NOT DETECTED NOT DETECTED Final   Haemophilus influenzae NOT DETECTED NOT DETECTED Final   Neisseria meningitidis NOT DETECTED NOT DETECTED Final   Pseudomonas aeruginosa NOT DETECTED NOT DETECTED Final   Candida albicans NOT DETECTED NOT DETECTED Final   Candida glabrata NOT DETECTED NOT DETECTED Final   Candida krusei NOT DETECTED NOT DETECTED Final   Candida parapsilosis NOT DETECTED NOT DETECTED Final   Candida tropicalis NOT DETECTED NOT DETECTED Final    Comment: Performed at [facility], [place] [address] [place], [place] [address]  Culture, blood (Routine X 2) w Reflex to ID Panel     Status: None (Preliminary result)   Collection Time: 09/13/17  3:50 PM  Result Value Ref Range Status   Specimen Description BLOOD RIGHT HAND  Final   Special Requests IN PEDIATRIC BOTTLE Blood Culture adequate volume  Final   Culture   Final    NO GROWTH < 24 HOURS Performed at [facility], [place] [address] [place], [place] [address]    Report Status PENDING  Incomplete  Culture, blood (Routine X 2) w Reflex to ID Panel     Status: None (Preliminary result)   Collection Time: 09/13/17  4:00 PM  Result Value Ref Range Status   Specimen Description BLOOD RIGHT ARM  Final   Special Requests IN PEDIATRIC BOTTLE Blood Culture adequate volume  Final   Culture   Final    NO GROWTH < 24 HOURS Performed at [facility], [place] [address] [place],  [address]    Report Status PENDING  Incomplete         Radiology Studies: Mr Hip Right Wo Contrast  Result Date: 09/12/2017 CLINICAL DATA:  32 year old male admitted with progressive joint pain since last week. Patient has been injecting meth. Pain has been migratory with  bilateral ankle/feet, knee and hip involvement as well as right shoulder. Question septic arthritis. EXAM: MR OF THE RIGHT HIP WITHOUT CONTRAST TECHNIQUE: Multiplanar, multisequence MR imaging was performed. No intravenous contrast was administered. COMPARISON:  Plain radiographs 09/11/2016 FINDINGS: Bones: Osteophytes are seen about the left femoral head-neck junction. No flattening or subchondral edema of the femoral heads. No marrow edema of the femoral heads. No fracture, joint dislocation nor suspicious osseous lesions. Articular cartilage and labrum Articular cartilage: No focal chondral defects of the left acetabular or femoral head cartilage. Labrum:  No labral tear is identified about either hip. Joint or bursal effusion Joint effusion: Small bilateral hip joint effusions without significant synovial proliferation. Bursae: None Muscles and tendons Muscles and tendons: Mild nonspecific intramuscular edema involving the pectineus, adductor and obturator externus muscles about both hips and left obturator internus. Mild gluteal edema is also noted bilaterally. Other findings Miscellaneous: Diffuse soft tissue anasarca about the included pelvis and both hips. Trace presacral edema and fluid. IMPRESSION: 1. Mild soft tissue anasarca about the pelvis and both hips. No drainable fluid collection or abscess. 2. Nonspecific mild intramuscular edema about both hips question myositis. 3. Small bilateral hip joint effusions without frank bone destruction or marrow signal  abnormalities to suggest a septic joint. If clinical concern persists for septic arthritis, joint aspiration of the affected side may prove useful. Electronically Signed   By: Ashley Royalty M.D.   On: 09/12/2017 23:31   [place] Rt Lower Extrem Ltd Soft Tissue Non Vascular  Result Date: 09/13/2017 CLINICAL DATA:  Palpable abnormality in right calf. EXAM: ULTRASOUND right LOWER EXTREMITY LIMITED TECHNIQUE: Ultrasound examination of the lower extremity soft  tissues was performed in the area of clinical concern. COMPARISON:  None. FINDINGS: No definite mass or fluid collection or cyst is seen in the area of palpable concern in the posterior aspect of the right calf. There does appear to be thrombosed superficial vein in this area. IMPRESSION: Probable thrombosed superficial vein seen in area of palpable concern in the right calf. No mass, fluid collection or cyst is noted. Electronically Signed   By: Marijo Conception, M.D.   On: 09/13/2017 12:39        Scheduled Meds: . acetaminophen  650 mg Rectal Once  . bupivacaine  10 mL Infiltration Once  . lidocaine (PF)  5 mL Other Once  . nicotine  14 mg Transdermal Daily  . potassium chloride  30 mEq Oral BID   Continuous Infusions: .  ceFAZolin (ANCEF) IV 2 g (09/14/17 1456)  . dextrose 5 % and 0.45 % NaCl with KCl 20 mEq/L 125 mL/hr at 09/14/17 1444     LOS: 3 days    Time spent: 35 minutes.     Elmarie Shiley, MD [facility] Pager [phone]  If 7PM-7AM, please contact night-coverage [email or web address] Password [facility] 09/14/2017, 4:38 PM

## 2017-09-14 NOTE — Progress Notes (Signed)
Notified MD of pt temp-103. Tylenol and 500mg  bolus of normal saline given. Will recheck and continue to monitor.

## 2017-09-14 NOTE — Progress Notes (Signed)
Tech informed me that she had found two insulin syringes wrapped in pt gown, while cleaning him up. There was nothing in the needles at this time. Disposed of needle in sharps container. Rushie Goltz, RN

## 2017-09-14 NOTE — Progress Notes (Signed)
Late Entry: MD notified pt temp-102.9, ibuprofen 800mg  given. recheck temp-98.8.

## 2017-09-15 ENCOUNTER — Telehealth: Payer: Self-pay | Admitting: Emergency Medicine

## 2017-09-15 ENCOUNTER — Inpatient Hospital Stay (HOSPITAL_COMMUNITY): Payer: Self-pay

## 2017-09-15 DIAGNOSIS — M60009 Infective myositis, unspecified site: Secondary | ICD-10-CM | POA: Insufficient documentation

## 2017-09-15 DIAGNOSIS — M609 Myositis, unspecified: Secondary | ICD-10-CM

## 2017-09-15 DIAGNOSIS — R197 Diarrhea, unspecified: Secondary | ICD-10-CM

## 2017-09-15 DIAGNOSIS — B9689 Other specified bacterial agents as the cause of diseases classified elsewhere: Secondary | ICD-10-CM

## 2017-09-15 MED ORDER — FAMOTIDINE 20 MG PO TABS
20.0000 mg | ORAL_TABLET | Freq: Every day | ORAL | Status: DC
  Administered 2017-09-15 – 2017-11-05 (×53): 20 mg via ORAL
  Filled 2017-09-15 (×55): qty 1

## 2017-09-15 MED ORDER — BUPRENORPHINE HCL-NALOXONE HCL 8-2 MG SL SUBL
1.0000 | SUBLINGUAL_TABLET | Freq: Two times a day (BID) | SUBLINGUAL | Status: DC
  Administered 2017-09-16 – 2017-11-05 (×101): 1 via SUBLINGUAL
  Filled 2017-09-15 (×101): qty 1

## 2017-09-15 MED ORDER — KETOROLAC TROMETHAMINE 15 MG/ML IJ SOLN
15.0000 mg | Freq: Three times a day (TID) | INTRAMUSCULAR | Status: DC | PRN
  Administered 2017-09-16 – 2017-09-18 (×4): 15 mg via INTRAVENOUS
  Filled 2017-09-15 (×4): qty 1

## 2017-09-15 MED ORDER — ALUM & MAG HYDROXIDE-SIMETH 200-200-20 MG/5ML PO SUSP
30.0000 mL | Freq: Four times a day (QID) | ORAL | Status: DC | PRN
  Administered 2017-09-15 – 2017-11-02 (×3): 30 mL via ORAL
  Filled 2017-09-15 (×3): qty 30

## 2017-09-15 MED ORDER — BUPRENORPHINE HCL-NALOXONE HCL 2-0.5 MG SL SUBL
2.0000 | SUBLINGUAL_TABLET | Freq: Once | SUBLINGUAL | Status: AC
  Administered 2017-09-15: 2 via SUBLINGUAL
  Filled 2017-09-15: qty 2

## 2017-09-15 MED ORDER — ORITAVANCIN DIPHOSPHATE 400 MG IV SOLR
1200.0000 mg | Freq: Once | INTRAVENOUS | Status: AC
  Administered 2017-09-15: 1200 mg via INTRAVENOUS
  Filled 2017-09-15: qty 120

## 2017-09-15 MED ORDER — TRAMADOL HCL 50 MG PO TABS: 50.0000 mg | ORAL_TABLET | Freq: Four times a day (QID) | ORAL | Status: DC | PRN

## 2017-09-15 NOTE — Progress Notes (Signed)
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 Repeat blood cultures were positive for gram positive cocci in clusters. Continues to have myositis which is likely related to his infection as well as continued IVDU. There are no apparent metastatic sources for the infection. He has develop diarrhea within the last 24 hours and question side effect or intolerance to antibiotics. He has been less than optimally cooperative at times refusing multiple blood tests and imaging making caring for him an increased challenge. I am concerned that if his pain is not well controlled he will attempt to leave AMA.   1. Continue Ancef. 2. Repeat blood cultures ordered.  3. Monitor diarrhea and consider changing antibiotic if necessary.  4. Pain management per primary team.    Principal Problem:   Acute bacterial endocarditis Active Problems:   Heroin abuse (HCC)   Polysubstance abuse (HCC)   Severe sepsis (HCC)   Bacteremia due to Staphylococcus aureus   Cigarette smoker   Hypokalemia   DIC (disseminated intravascular coagulation) (HCC)   Staphylococcal arthritis of right shoulder (HCC)   Septic infrapatellar bursitis of left knee   Staphylococcal arthritis of left hip (HCC)   Staphylococcal arthritis of right hip (HCC)   Calf abscess   Pain   . acetaminophen  650 mg Rectal Once  . lidocaine (PF)  5 mL Other Once  . nicotine  14 mg Transdermal Daily  . potassium chloride  30 mEq Oral BID     HPI:   Interval History:  Low grade temperature since 6am on 2/13 with no significant fevers. Continues to receive Ancef. Blood cultures repeated on 2/12 have turned positive for gram positive cocci in clusters. MRI of the right shoulder with mild supraspinatus and infraspinatus muscle edema suggestive of myositis with no drainable fluid collection or joint  effusion.   Feels horrible and continues to have generalized body pains that are not getting any better. He continues to ask about changing his pain medications around. Has noted increased bowel movements starting in the last 24 hours.     Review of Systems: Review of Systems  Constitutional: Negative for chills and fever.  Respiratory: Negative for cough, shortness of breath and wheezing.   Cardiovascular: Negative for chest pain, palpitations and leg swelling.  Gastrointestinal: Positive for abdominal pain and diarrhea. Negative for constipation.  Musculoskeletal: Positive for myalgias.     Past Medical History:  Diagnosis Date  . Bacterial endocarditis   . Heroin abuse ([place])   . Liver disease    possible Hep. C no treatment or confirmation   . Opiate abuse, continuous ([place])   . Polysubstance abuse ([place])   . Seizures ([place])     Social History   Tobacco Use  . Smoking status: Current Every [place] Smoker    Packs/Depass: 0.50    Types: Cigarettes  . Smokeless tobacco: Current User  Substance Use Topics  . Alcohol use: No  . Drug use: Yes    Types: IV    Comment: heroin    History reviewed. No pertinent family history.  Allergies  Allergen Reactions  . Prednisone Hives  . Amoxicillin Hives    OBJECTIVE: Blood pressure 123/74, pulse (!) 122, temperature 99.5 F (37.5 C), resp. rate 18, height 5\' 9"  (1.753 m), weight 162 lb 11.2 oz (73.8 kg), SpO2  98 %.  Physical Exam  Constitutional: He is oriented to person, place, and time.  Cardiovascular: Normal rate, regular rhythm and intact distal pulses.  Murmur heard. Pulmonary/Chest: Effort normal and breath sounds normal. No respiratory distress. He has no wheezes. He has no rales. He exhibits no tenderness.  Abdominal: Soft. Bowel sounds are normal. He exhibits no mass. There is no guarding.  Neurological: He is alert and oriented to person, place, and time.  Skin: Skin is warm and dry.    Lab Results Lab Results    Component Value Date   WBC 13.9 (H) 09/14/2017   HGB 8.0 (L) 09/14/2017   HCT 23.9 (L) 09/14/2017   MCV 93.4 09/14/2017   PLT 66 (L) 09/14/2017    Lab Results  Component Value Date   CREATININE 0.81 09/14/2017   BUN 9 09/14/2017   NA 130 (L) 09/14/2017   K 4.2 09/14/2017   CL 104 09/14/2017   CO2 19 (L) 09/14/2017    Lab Results  Component Value Date   ALT 46 09/14/2017   AST 97 (H) 09/14/2017   ALKPHOS 91 09/14/2017   BILITOT 0.7 09/14/2017     Microbiology: Recent Results (from the past 240 hour(s))  Blood culture (routine x 2)     Status: Abnormal   Collection Time: 09/10/17  7:45 PM  Result Value Ref Range Status   Specimen Description BLOOD RIGHT HAND  Final   Special Requests IN PEDIATRIC BOTTLE Blood Culture adequate volume  Final   Culture  Setup Time   Final    GRAM POSITIVE COCCI IN CLUSTERS IN PEDIATRIC BOTTLE CRITICAL RESULT CALLED TO, READ BACK BY AND VERIFIED WITH: T RUDISILL,PHARMD AT 4010 09/11/17 BY L BENFIELD Performed at [facility], [place] [address] [place], [place] [address]    Culture STAPHYLOCOCCUS AUREUS (A)  Final   Report Status 09/13/2017 FINAL  Final   Organism ID, Bacteria STAPHYLOCOCCUS AUREUS  Final      Susceptibility   Staphylococcus aureus - MIC*    CIPROFLOXACIN <=0.5 SENSITIVE Sensitive     ERYTHROMYCIN <=0.25 SENSITIVE Sensitive     GENTAMICIN <=0.5 SENSITIVE Sensitive     OXACILLIN 0.5 SENSITIVE Sensitive     TETRACYCLINE <=1 SENSITIVE Sensitive     VANCOMYCIN 1 SENSITIVE Sensitive     TRIMETH/SULFA <=10 SENSITIVE Sensitive     CLINDAMYCIN <=0.25 SENSITIVE Sensitive     RIFAMPIN <=0.5 SENSITIVE Sensitive     Inducible Clindamycin NEGATIVE Sensitive     * STAPHYLOCOCCUS AUREUS  Blood Culture ID Panel (Reflexed)     Status: Abnormal   Collection Time: 09/10/17  7:45 PM  Result Value Ref Range Status   Enterococcus species NOT DETECTED NOT DETECTED Final   Vancomycin resistance NOT DETECTED NOT DETECTED Final    Listeria monocytogenes NOT DETECTED NOT DETECTED Final   Staphylococcus species DETECTED (A) NOT DETECTED Final    Comment: CRITICAL RESULT CALLED TO, READ BACK BY AND VERIFIED WITH: T RUDISILL,PHARMD AT 6644 09/11/17 BY L BENFIELD    Staphylococcus aureus DETECTED (A) NOT DETECTED Final    Comment: CRITICAL RESULT CALLED TO, READ BACK BY AND VERIFIED WITH: T RUDISILL,PHARMD AT 0347 09/11/17 BY L BENFIELD Methicillin (oxacillin) susceptible Staphylococcus aureus (MSSA). Preferred therapy is anti staphylococcal beta lactam antibiotic (Cefazolin or Nafcillin), unless clinically contraindicated.    Methicillin resistance NOT DETECTED NOT DETECTED Final   Streptococcus species NOT DETECTED NOT DETECTED Final   Streptococcus agalactiae NOT DETECTED NOT DETECTED Final   Streptococcus  pneumoniae NOT DETECTED NOT DETECTED Final   Streptococcus pyogenes NOT DETECTED NOT DETECTED Final   Acinetobacter baumannii NOT DETECTED NOT DETECTED Final   Enterobacteriaceae species NOT DETECTED NOT DETECTED Final   Enterobacter cloacae complex NOT DETECTED NOT DETECTED Final   Escherichia coli NOT DETECTED NOT DETECTED Final   Klebsiella oxytoca NOT DETECTED NOT DETECTED Final   Klebsiella pneumoniae NOT DETECTED NOT DETECTED Final   Proteus species NOT DETECTED NOT DETECTED Final   Serratia marcescens NOT DETECTED NOT DETECTED Final   Carbapenem resistance NOT DETECTED NOT DETECTED Final   Haemophilus influenzae NOT DETECTED NOT DETECTED Final   Neisseria meningitidis NOT DETECTED NOT DETECTED Final   Pseudomonas aeruginosa NOT DETECTED NOT DETECTED Final   Candida albicans NOT DETECTED NOT DETECTED Final   Candida glabrata NOT DETECTED NOT DETECTED Final   Candida krusei NOT DETECTED NOT DETECTED Final   Candida parapsilosis NOT DETECTED NOT DETECTED Final   Candida tropicalis NOT DETECTED NOT DETECTED Final    Comment: Performed at [facility], [place] [address] [place], [place] [address]  Blood  culture (routine x 2)     Status: Abnormal   Collection Time: 09/10/17  9:50 PM  Result Value Ref Range Status   Specimen Description BLOOD LEFT ANTECUBITAL  Final   Special Requests IN PEDIATRIC BOTTLE Blood Culture adequate volume  Final   Culture  Setup Time   Final    GRAM POSITIVE COCCI IN PEDIATRIC BOTTLE CRITICAL VALUE NOTED.  VALUE IS CONSISTENT WITH PREVIOUSLY REPORTED AND CALLED VALUE.    Culture (A)  Final    STAPHYLOCOCCUS AUREUS SUSCEPTIBILITIES PERFORMED ON PREVIOUS CULTURE WITHIN THE LAST 5 DAYS. Performed at [facility], [place] [address] [place], [place] [address]    Report Status 09/13/2017 FINAL  Final  Culture, blood (routine x 2)     Status: None (Preliminary result)   Collection Time: 09/11/17  6:35 PM  Result Value Ref Range Status   Specimen Description BLOOD LEFT ANTECUBITAL  Final   Special Requests   Final    BOTTLES DRAWN AEROBIC AND ANAEROBIC Blood Culture adequate volume   Culture   Final    NO GROWTH 3 DAYS Performed at [facility], [place] [address] [place], [place] [address]    Report Status PENDING  Incomplete  Culture, blood (routine x 2)     Status: Abnormal   Collection Time: 09/11/17  9:30 PM  Result Value Ref Range Status   Specimen Description BLOOD RIGHT HAND  Final   Special Requests IN PEDIATRIC BOTTLE Blood Culture adequate volume  Final   Culture  Setup Time   Final    GRAM POSITIVE COCCI IN CLUSTERS IN PEDIATRIC BOTTLE CRITICAL RESULT CALLED TO, READ BACK BY AND VERIFIED WITH: PHARMD B MARTIN [id number] MLM    Culture (A)  Final    STAPHYLOCOCCUS AUREUS SUSCEPTIBILITIES PERFORMED ON PREVIOUS CULTURE WITHIN THE LAST 5 DAYS. Performed at [facility], [place] [address] [place], [place] [address]    Report Status 09/14/2017 FINAL  Final  Blood Culture ID Panel (Reflexed)     Status: Abnormal   Collection Time: 09/11/17  9:30 PM  Result Value Ref Range Status   Enterococcus species NOT DETECTED NOT DETECTED Final     Vancomycin resistance NOT DETECTED NOT DETECTED Final   Listeria monocytogenes NOT DETECTED NOT DETECTED Final   Staphylococcus species DETECTED (A) NOT DETECTED Final    Comment: CRITICAL RESULT CALLED TO,  READ BACK BY AND VERIFIED WITH: Diona Browner PHARMD 0254 09/12/17 A BROWNING    Staphylococcus aureus DETECTED (A) NOT DETECTED Final    Comment: Methicillin (oxacillin) susceptible Staphylococcus aureus (MSSA). Preferred therapy is anti staphylococcal beta lactam antibiotic (Cefazolin or Nafcillin), unless clinically contraindicated. CRITICAL RESULT CALLED TO, READ BACK BY AND VERIFIED WITH: T DANG PHARMD [id number] 09/12/17 A BROWNING    Methicillin resistance NOT DETECTED NOT DETECTED Final   Streptococcus species NOT DETECTED NOT DETECTED Final   Streptococcus agalactiae NOT DETECTED NOT DETECTED Final   Streptococcus pneumoniae NOT DETECTED NOT DETECTED Final   Streptococcus pyogenes NOT DETECTED NOT DETECTED Final   Acinetobacter baumannii NOT DETECTED NOT DETECTED Final   Enterobacteriaceae species NOT DETECTED NOT DETECTED Final   Enterobacter cloacae complex NOT DETECTED NOT DETECTED Final   Escherichia coli NOT DETECTED NOT DETECTED Final   Klebsiella oxytoca NOT DETECTED NOT DETECTED Final   Klebsiella pneumoniae NOT DETECTED NOT DETECTED Final   Proteus species NOT DETECTED NOT DETECTED Final   Serratia marcescens NOT DETECTED NOT DETECTED Final   Carbapenem resistance NOT DETECTED NOT DETECTED Final   Haemophilus influenzae NOT DETECTED NOT DETECTED Final   Neisseria meningitidis NOT DETECTED NOT DETECTED Final   Pseudomonas aeruginosa NOT DETECTED NOT DETECTED Final   Candida albicans NOT DETECTED NOT DETECTED Final   Candida glabrata NOT DETECTED NOT DETECTED Final   Candida krusei NOT DETECTED NOT DETECTED Final   Candida parapsilosis NOT DETECTED NOT DETECTED Final   Candida tropicalis NOT DETECTED NOT DETECTED Final    Comment: Performed at [facility], [place].  [address], [place], [place] [address]  Culture, blood (Routine X 2) w Reflex to ID Panel     Status: Abnormal (Preliminary result)   Collection Time: 09/13/17  3:50 PM  Result Value Ref Range Status   Specimen Description BLOOD RIGHT HAND  Final   Special Requests IN PEDIATRIC BOTTLE Blood Culture adequate volume  Final   Culture  Setup Time   Final    GRAM POSITIVE COCCI IN PEDIATRIC BOTTLE CRITICAL VALUE NOTED.  VALUE IS CONSISTENT WITH PREVIOUSLY REPORTED AND CALLED VALUE. Performed at [facility], [place] [address] [place], [place] [address]    Culture STAPHYLOCOCCUS AUREUS (A)  Final   Report Status PENDING  Incomplete  Culture, blood (Routine X 2) w Reflex to ID Panel     Status: Abnormal (Preliminary result)   Collection Time: 09/13/17  4:00 PM  Result Value Ref Range Status   Specimen Description BLOOD RIGHT ARM  Final   Special Requests IN PEDIATRIC BOTTLE Blood Culture adequate volume  Final   Culture  Setup Time   Final    GRAM POSITIVE COCCI IN CLUSTERS IN PEDIATRIC BOTTLE CRITICAL RESULT CALLED TO, READ BACK BY AND VERIFIED WITH: Carlis Stable [id number] MLM Performed at [facility], [place] [address] [place], [place] [address]    Culture STAPHYLOCOCCUS AUREUS (A)  Final   Report Status PENDING  Incomplete     Terri Piedra, NP [place] [facility] [phone] Pager  09/15/2017  10:31 AM

## 2017-09-15 NOTE — Telephone Encounter (Signed)
Post ED Visit - Positive Culture Follow-up  Culture report reviewed by antimicrobial stewardship pharmacist:  [x]  Elenor Quinones, Pharm.D. []  Heide Guile, Pharm.D., BCPS AQ-ID []  Parks Neptune, Pharm.D., BCPS []  Alycia Rossetti, Pharm.D., BCPS []  [place], [place].D., BCPS, AAHIVP []  Legrand Como, Pharm.D., BCPS, AAHIVP []  Salome Arnt, PharmD, BCPS []  Jalene Mullet, PharmD []  Vincenza Hews, PharmD, BCPS  Positive blood culture Admitted to [facility], no further patient follow-up is required at this time.  Hazle Nordmann 09/15/2017, 1:17 PM

## 2017-09-15 NOTE — Progress Notes (Signed)
PROGRESS NOTE    Gary Hicks  KGM:[id number] DOB: 05-13-1986 DOA: 09/11/2017 PCP: Patient, [name] Per   Brief Narrative: This is a 32 year old man with active IV drug abuse, who presents with generalized pain. History is limited secondary to his acute illness including pain and fluctuations in alertness,his father is present at the bedside assists with history. The patient reports that the painhasbeen happening since the end of January, father reports that 2-3 days ago he was concerned the patient may have had a seizure because he was found unresponsive with some abnormal eye movements in the fetal position. The patient was incarcerated and released 3-4 weeks ago. Patient admits using IV methamphetamine as well as heroin in the past 24 hours prior to admission. Reports global polyarthralgias including his bilateral feet bilateral knees bilateral hips and shoulders. Also reports fevers and chills generalized myalgias. Acutely worsened today for what she calls father who brought him to seek medical attention.     Assessment & Plan:   Principal Problem:   Acute bacterial endocarditis Active Problems:   Heroin abuse ([place])   Polysubstance abuse ([place])   Severe sepsis ([place])   Bacteremia due to Staphylococcus aureus   Cigarette smoker   Hypokalemia   DIC (disseminated intravascular coagulation) (HCC)   Staphylococcal arthritis of right shoulder (HCC)   Septic infrapatellar bursitis of left knee   Staphylococcal arthritis of left hip (HCC)   Staphylococcal arthritis of right hip (HCC)   Calf abscess   Pain   Pyomyositis   1-Tricuspid Valve endocarditis, MSSA bacteremia, endocarditis.  Continue with ancef.  Still spiking fever.  Blood culture 2-11 Staphylococcus MSSA Repeated blood culture 2-12 Staph  Will need repeated blood cultures.   2-Multiples joints pain; right shoulder MRI today.  -MRI left knee ordered.  -MRI hip;  Mild soft tissue anasarca about the pelvis and both  hips. No drainable fluid collection or abscess. Nonspecific mild intramuscular edema about both hips question myositis. Small bilateral hip joint effusions without frank bone destruction or marrow signal abnormalities to suggest a septic joint.  On IV dilaudid.  MRI Right shoulder; Mild supraspinatus and infraspinatus muscle edema suggestive of Myositis.  Mild subacromial/subdeltoid bursitis.  3-Sepsis secondary to number one. On IV fluids, IV antibiotics.   4-Heroin use;  IM teaching service consulted , to consider suboxone.  I really appreciate Dr Damita Dunnings help with Suboxone.   5-Convulsions Likely related to drug use We will consider MRI after head CT Seizure precautions PRN Ativan  6-Anxiety; PRN ativan.   7-Anemia; check anemia panel in am.  Thrombocytopenia. Monitor closely   Hyponatremia; IV fluids.   DVT prophylaxis: will need to start heparin if hb stable.  Code Status: full code.  Family Communication: mother at bedside. 2-13  Disposition Plan: to be determine    Consultants:   ID  Ortho    Procedures:  ECHo;  - Left ventricle: The cavity size was normal. Systolic function was   normal. The estimated ejection fraction was in the range of 60%   to 65%. Wall motion was normal; there were no regional wall   motion abnormalities. - Left atrium: The atrium was mildly dilated. - Right ventricle: The cavity size was moderately dilated. Wall   thickness was normal. - Right atrium: The atrium was moderately dilated. - Tricuspid valve: There was a large, 1.1 cm (W) x 1.8 cm (L),   multilobulated, highly mobile vegetation on the right ventricular   aspect of the anterior leaflet. There was  moderate-severe   regurgitation. - Pulmonary arteries: Systolic pressure was mildly increased. PA   peak pressure: 44 mm Hg (S). - Pericardium, extracardiac: A trivial pericardial effusion was    identified posterior to the heart.  Antimicrobials: Ancef  2-11  Subjective: He is having a lot of pain, knee, shoulder.  Asking for pain medications.   Objective: Vitals:   09/14/17 1542 09/14/17 2032 09/15/17 0549 09/15/17 0954  BP: 135/68 113/78 121/75 123/74  Pulse: (!) 121 (!) 108 (!) 112 (!) 122  Resp: (!) 24 15 20 18   Temp: 100.3 F (37.9 C) 99.4 F (37.4 C) 99.5 F (37.5 C)   TempSrc: Oral     SpO2: 98% 100% 100% 98%  Weight:  73.8 kg (162 lb 11.2 oz)    Height:        Intake/Output Summary (Last 24 hours) at 09/15/2017 1516 Last data filed at 09/15/2017 8921 Gross per 24 hour  Intake 2085.83 ml  Output 3825 ml  Net -1739.17 ml   Filed Weights   09/12/17 0545 09/12/17 2240 09/14/17 2032  Weight: 64 kg (141 lb 1.5 oz) 64 kg (141 lb 2 oz) 73.8 kg (162 lb 11.2 oz)    Examination:  General exam; pale, in pain  Respiratory system: CTA Cardiovascular system: S 1, S 2 RRR Gastrointestinal system: BS present, soft, nt Central nervous system: non focal.  Extremities: left knee with mild redness.  Skin: petechia rash    Data Reviewed: I have personally reviewed following labs and imaging studies  CBC: Recent Labs  Lab 09/11/17 0041 09/11/17 1826 09/12/17 0629 09/13/17 0914 09/14/17 0806  WBC 9.9 16.1* 9.3 8.1 13.9*  NEUTROABS  --  12.7*  --  5.8 11.7*  HGB 12.2* 9.1* 9.7* 8.7* 8.0*  HCT 34.7* 26.9* 28.7* 25.3* 23.9*  MCV 92.3 92.1 93.5 93.7 93.4  PLT 54*  54* 73* 52* 52* 66*   Basic Metabolic Panel: Recent Labs  Lab 09/11/17 0041 09/11/17 1826 09/11/17 2100 09/12/17 0629 09/13/17 0914 09/14/17 0806  NA 130* 130*  --  135 132* 130*  K 2.9* 3.4*  --  3.5 3.3* 4.2  CL 96* 96*  --  102 102 104  CO2 22 23  --  22 19* 19*  GLUCOSE 129* 110*  --  112* 143* 105*  BUN 24* 16  --  11 9 9   CREATININE 1.25* 1.09  --  0.84 0.99 0.81  CALCIUM 7.5* 7.7*  --  7.5* 7.1* 7.0*  MG  --   --  1.7  --   --   --    GFR: Estimated Creatinine Clearance: 132.1 mL/min (by C-G formula based on SCr of 0.81 mg/dL). Liver  Function Tests: Recent Labs  Lab 09/10/17 1811 09/11/17 0041 09/11/17 1826 09/14/17 0806  AST 45* 37 36 97*  ALT 42 34 28 46  ALKPHOS 148* 83 76 91  BILITOT 1.2 0.9 1.0 0.7  PROT 6.6 5.5* 5.7* 5.2*  ALBUMIN 2.6* 2.1* 1.9* 1.4*   No results for input(s): LIPASE, AMYLASE in the last 168 hours. No results for input(s): AMMONIA in the last 168 hours. Coagulation Profile: Recent Labs  Lab 09/11/17 0041 09/11/17 1826  INR 1.18 1.30   Cardiac Enzymes: No results for input(s): CKTOTAL, CKMB, CKMBINDEX, TROPONINI in the last 168 hours. BNP (last 3 results) No results for input(s): PROBNP in the last 8760 hours. HbA1C: No results for input(s): HGBA1C in the last 72 hours. CBG: Recent Labs  Lab 09/11/17 1218  GLUCAP 97   Lipid Profile: No results for input(s): CHOL, HDL, LDLCALC, TRIG, CHOLHDL, LDLDIRECT in the last 72 hours. Thyroid Function Tests: No results for input(s): TSH, T4TOTAL, FREET4, T3FREE, THYROIDAB in the last 72 hours. Anemia Panel: No results for input(s): VITAMINB12, FOLATE, FERRITIN, TIBC, IRON, RETICCTPCT in the last 72 hours. Sepsis Labs: Recent Labs  Lab 09/10/17 2005 09/11/17 1850 09/11/17 2127 09/12/17 0629  LATICACIDVEN 1.89 2.72* 2.8* 1.4    Recent Results (from the past 240 hour(s))  Blood culture (routine x 2)     Status: Abnormal   Collection Time: 09/10/17  7:45 PM  Result Value Ref Range Status   Specimen Description BLOOD RIGHT HAND  Final   Special Requests IN PEDIATRIC BOTTLE Blood Culture adequate volume  Final   Culture  Setup Time   Final    GRAM POSITIVE COCCI IN CLUSTERS IN PEDIATRIC BOTTLE CRITICAL RESULT CALLED TO, READ BACK BY AND VERIFIED WITH: T RUDISILL,PHARMD AT 7106 09/11/17 BY L BENFIELD Performed at [facility], [place] [address], [place], [place] [address]    Culture STAPHYLOCOCCUS AUREUS (A)  Final   Report Status 09/13/2017 FINAL  Final   Organism ID, Bacteria STAPHYLOCOCCUS AUREUS  Final      Susceptibility    Staphylococcus aureus - MIC*    CIPROFLOXACIN <=0.5 SENSITIVE Sensitive     ERYTHROMYCIN <=0.25 SENSITIVE Sensitive     GENTAMICIN <=0.5 SENSITIVE Sensitive     OXACILLIN 0.5 SENSITIVE Sensitive     TETRACYCLINE <=1 SENSITIVE Sensitive     VANCOMYCIN 1 SENSITIVE Sensitive     TRIMETH/SULFA <=10 SENSITIVE Sensitive     CLINDAMYCIN <=0.25 SENSITIVE Sensitive     RIFAMPIN <=0.5 SENSITIVE Sensitive     Inducible Clindamycin NEGATIVE Sensitive     * STAPHYLOCOCCUS AUREUS  Blood Culture ID Panel (Reflexed)     Status: Abnormal   Collection Time: 09/10/17  7:45 PM  Result Value Ref Range Status   Enterococcus species NOT DETECTED NOT DETECTED Final   Vancomycin resistance NOT DETECTED NOT DETECTED Final   Listeria monocytogenes NOT DETECTED NOT DETECTED Final   Staphylococcus species DETECTED (A) NOT DETECTED Final    Comment: CRITICAL RESULT CALLED TO, READ BACK BY AND VERIFIED WITH: T RUDISILL,PHARMD AT 5462 09/11/17 BY L BENFIELD    Staphylococcus aureus DETECTED (A) NOT DETECTED Final    Comment: CRITICAL RESULT CALLED TO, READ BACK BY AND VERIFIED WITH: T RUDISILL,PHARMD AT 7035 09/11/17 BY L BENFIELD Methicillin (oxacillin) susceptible Staphylococcus aureus (MSSA). Preferred therapy is anti staphylococcal beta lactam antibiotic (Cefazolin or Nafcillin), unless clinically contraindicated.    Methicillin resistance NOT DETECTED NOT DETECTED Final   Streptococcus species NOT DETECTED NOT DETECTED Final   Streptococcus agalactiae NOT DETECTED NOT DETECTED Final   Streptococcus pneumoniae NOT DETECTED NOT DETECTED Final   Streptococcus pyogenes NOT DETECTED NOT DETECTED Final   Acinetobacter baumannii NOT DETECTED NOT DETECTED Final   Enterobacteriaceae species NOT DETECTED NOT DETECTED Final   Enterobacter cloacae complex NOT DETECTED NOT DETECTED Final   Escherichia coli NOT DETECTED NOT DETECTED Final   Klebsiella oxytoca NOT DETECTED NOT DETECTED Final   Klebsiella pneumoniae NOT  DETECTED NOT DETECTED Final   Proteus species NOT DETECTED NOT DETECTED Final   Serratia marcescens NOT DETECTED NOT DETECTED Final   Carbapenem resistance NOT DETECTED NOT DETECTED Final   Haemophilus influenzae NOT DETECTED NOT DETECTED Final   Neisseria meningitidis NOT DETECTED NOT DETECTED Final   Pseudomonas aeruginosa NOT DETECTED NOT DETECTED  Final   Candida albicans NOT DETECTED NOT DETECTED Final   Candida glabrata NOT DETECTED NOT DETECTED Final   Candida krusei NOT DETECTED NOT DETECTED Final   Candida parapsilosis NOT DETECTED NOT DETECTED Final   Candida tropicalis NOT DETECTED NOT DETECTED Final    Comment: Performed at [facility], [place] [address] [place], [place] [address]  Blood culture (routine x 2)     Status: Abnormal   Collection Time: 09/10/17  9:50 PM  Result Value Ref Range Status   Specimen Description BLOOD LEFT ANTECUBITAL  Final   Special Requests IN PEDIATRIC BOTTLE Blood Culture adequate volume  Final   Culture  Setup Time   Final    GRAM POSITIVE COCCI IN PEDIATRIC BOTTLE CRITICAL VALUE NOTED.  VALUE IS CONSISTENT WITH PREVIOUSLY REPORTED AND CALLED VALUE.    Culture (A)  Final    STAPHYLOCOCCUS AUREUS SUSCEPTIBILITIES PERFORMED ON PREVIOUS CULTURE WITHIN THE LAST 5 DAYS. Performed at [facility], [place] [address] [place], [place] [address]    Report Status 09/13/2017 FINAL  Final  Culture, blood (routine x 2)     Status: None (Preliminary result)   Collection Time: 09/11/17  6:35 PM  Result Value Ref Range Status   Specimen Description BLOOD LEFT ANTECUBITAL  Final   Special Requests   Final    BOTTLES DRAWN AEROBIC AND ANAEROBIC Blood Culture adequate volume   Culture   Final    NO GROWTH 4 DAYS Performed at [facility], [place] [address], [place], [place] [address]    Report Status PENDING  Incomplete  Culture, blood (routine x 2)     Status: Abnormal   Collection Time: 09/11/17  9:30 PM  Result Value Ref Range Status    Specimen Description BLOOD RIGHT HAND  Final   Special Requests IN PEDIATRIC BOTTLE Blood Culture adequate volume  Final   Culture  Setup Time   Final    GRAM POSITIVE COCCI IN CLUSTERS IN PEDIATRIC BOTTLE CRITICAL RESULT CALLED TO, READ BACK BY AND VERIFIED WITH: PHARMD B MARTIN [id number] MLM    Culture (A)  Final    STAPHYLOCOCCUS AUREUS SUSCEPTIBILITIES PERFORMED ON PREVIOUS CULTURE WITHIN THE LAST 5 DAYS. Performed at [facility], [place] [address], [place], [place] [address]    Report Status 09/14/2017 FINAL  Final  Blood Culture ID Panel (Reflexed)     Status: Abnormal   Collection Time: 09/11/17  9:30 PM  Result Value Ref Range Status   Enterococcus species NOT DETECTED NOT DETECTED Final   Vancomycin resistance NOT DETECTED NOT DETECTED Final   Listeria monocytogenes NOT DETECTED NOT DETECTED Final   Staphylococcus species DETECTED (A) NOT DETECTED Final    Comment: CRITICAL RESULT CALLED TO, READ BACK BY AND VERIFIED WITH: T DANG PHARMD 1833 09/12/17 A BROWNING    Staphylococcus aureus DETECTED (A) NOT DETECTED Final    Comment: Methicillin (oxacillin) susceptible Staphylococcus aureus (MSSA). Preferred therapy is anti staphylococcal beta lactam antibiotic (Cefazolin or Nafcillin), unless clinically contraindicated. CRITICAL RESULT CALLED TO, READ BACK BY AND VERIFIED WITH: T DANG PHARMD 1833 09/12/17 A BROWNING    Methicillin resistance NOT DETECTED NOT DETECTED Final   Streptococcus species NOT DETECTED NOT DETECTED Final   Streptococcus agalactiae NOT DETECTED NOT DETECTED Final   Streptococcus pneumoniae NOT DETECTED NOT DETECTED Final   Streptococcus pyogenes NOT DETECTED NOT DETECTED Final   Acinetobacter baumannii NOT DETECTED NOT DETECTED Final   Enterobacteriaceae species NOT DETECTED  NOT DETECTED Final   Enterobacter cloacae complex NOT DETECTED NOT DETECTED Final   Escherichia coli NOT DETECTED NOT DETECTED Final   Klebsiella oxytoca NOT DETECTED NOT  DETECTED Final   Klebsiella pneumoniae NOT DETECTED NOT DETECTED Final   Proteus species NOT DETECTED NOT DETECTED Final   Serratia marcescens NOT DETECTED NOT DETECTED Final   Carbapenem resistance NOT DETECTED NOT DETECTED Final   Haemophilus influenzae NOT DETECTED NOT DETECTED Final   Neisseria meningitidis NOT DETECTED NOT DETECTED Final   Pseudomonas aeruginosa NOT DETECTED NOT DETECTED Final   Candida albicans NOT DETECTED NOT DETECTED Final   Candida glabrata NOT DETECTED NOT DETECTED Final   Candida krusei NOT DETECTED NOT DETECTED Final   Candida parapsilosis NOT DETECTED NOT DETECTED Final   Candida tropicalis NOT DETECTED NOT DETECTED Final    Comment: Performed at [facility], [place] [address] [place], [place] [address]  Culture, blood (Routine X 2) w Reflex to ID Panel     Status: Abnormal (Preliminary result)   Collection Time: 09/13/17  3:50 PM  Result Value Ref Range Status   Specimen Description BLOOD RIGHT HAND  Final   Special Requests IN PEDIATRIC BOTTLE Blood Culture adequate volume  Final   Culture  Setup Time   Final    GRAM POSITIVE COCCI IN PEDIATRIC BOTTLE CRITICAL VALUE NOTED.  VALUE IS CONSISTENT WITH PREVIOUSLY REPORTED AND CALLED VALUE. Performed at [facility], [place] [address], [place], [place] [address]    Culture STAPHYLOCOCCUS AUREUS (A)  Final   Report Status PENDING  Incomplete  Culture, blood (Routine X 2) w Reflex to ID Panel     Status: Abnormal (Preliminary result)   Collection Time: 09/13/17  4:00 PM  Result Value Ref Range Status   Specimen Description BLOOD RIGHT ARM  Final   Special Requests IN PEDIATRIC BOTTLE Blood Culture adequate volume  Final   Culture  Setup Time   Final    GRAM POSITIVE COCCI IN CLUSTERS IN PEDIATRIC BOTTLE CRITICAL RESULT CALLED TO, READ BACK BY AND VERIFIED WITH: Carlis Stable [id number] MLM Performed at [facility], [place] [address] [place], [place] [address]    Culture STAPHYLOCOCCUS  AUREUS (A)  Final   Report Status PENDING  Incomplete         Radiology Studies: Mr Shoulder Right Wo Contrast  Result Date: 09/15/2017 CLINICAL DATA:  Fevers, chills, generalized myalgias, and polyarthralgia, involving the bilateral shoulders, hips, knees, and feet. History of IV drug abuse. EXAM: MRI OF THE RIGHT SHOULDER WITHOUT CONTRAST TECHNIQUE: Multiplanar, multisequence MR imaging of the shoulder was performed. No intravenous contrast was administered. COMPARISON:  Right shoulder x-rays dated September 11, 2017. FINDINGS: Despite efforts by the technologist and patient, motion artifact is present on today's exam and could not be eliminated. This reduces exam sensitivity and specificity. Rotator cuff: Intact supraspinatus, infraspinatus, teres minor and subscapularis tendons. Muscles: Mild edema within the supraspinatus muscle and along the posterior aspect of the infraspinatus muscle. No muscle atrophy. Biceps long head:  Intact and normally positioned. Acromioclavicular Joint: Normal acromioclavicular joint. Type II acromion. Small subacromial/subdeltoid bursal fluid. Glenohumeral Joint: No joint effusion. No chondral defect. Labrum: Grossly intact, but evaluation is limited by lack of intraarticular fluid. Bones:  No marrow abnormality, fracture or dislocation. Other: None. IMPRESSION: 1. Mild supraspinatus and infraspinatus muscle edema suggestive of myositis. 2. Mild subacromial/subdeltoid bursitis. 3. No drainable fluid collection.  No glenohumeral joint effusion. Electronically Signed  By: Titus Dubin M.D.   On: 09/15/2017 08:22        Scheduled Meds: . acetaminophen  650 mg Rectal Once  . famotidine  20 mg Oral Daily  . lidocaine (PF)  5 mL Other Once  . nicotine  14 mg Transdermal Daily  . potassium chloride  30 mEq Oral BID   Continuous Infusions: . sodium chloride 125 mL/hr at 09/14/17 1812  .  ceFAZolin (ANCEF) IV 2 g (09/15/17 1424)  . oritavancin (ORBACTIV) IVPB        LOS: 4 days    Time spent: 35 minutes.     Elmarie Shiley, MD [facility] Pager [phone]  If 7PM-7AM, please contact night-coverage [email or web address] Password [facility] 09/15/2017, 3:16 PM

## 2017-09-15 NOTE — Consult Note (Addendum)
    Brief Consult Note:  I was asked to see Gary Hicks by Dr. Tyrell Antonio for consideration of medication assisted treatment of opioid withdrawal. Gary Hicks is a 32 year old man who injects drugs admitted to [facility] on 2/10 with MSSA Bacteremia and tricuspid valve endocarditis. He injects about 2g of heroin throughout most days. He was incarcerated for 12 months and released 3 weeks ago, however he reports also using heroin daily in prison. Currently he says he feels "like death." He is reporting diffuse body pain, agitation, nausea, diarrhea, flushing, and he says he is "scared to death" that these withdrawal symptoms are going to get worse. He has been treated with IV hydromorphone 1mg  q8 hours, last dose around noon today. He reports treatment at [facility] with Subutex many years ago, but can't tell me more about that. He last used Subutex about 3 weeks ago after leaving prison, but then reverted to using daily heroin.   On exam he is very uncomfortable appearing, flushed, lying still in bed and does not want to engage in our interview. COWS score 14 consistent with moderate withdrawal. He has a severe opioid use disorder and would benefit long term with another trial of suboxone treatment. He is already implying to other team members that he may need to leave the hospital to treat his own withdrawal with higher dose opioids. I think because of his very high tolerance/dependence, the low dose hydromorphone has not been able to keep up. I am going to start Suboxone 4mg  SL now. I warned him about the risk of precipitated withdrawal because of the hydromorphone earlier, he understands but is still very grateful that we are willing to start him on Suboxone now. I will check back in with him a few hours after the first dose to check on his symptoms.   Lalla Brothers, MD Pager: [phone]   Addendum 4:42pm:  I checked on Gary Hicks about 2 hours after first dose of Suboxone. He looks much better, reports withdrawal symptoms  are now minimal. He is afraid the symptoms will come back tonight. Overall very grateful that we started Suboxone now. We talked a little about outpatient follow up in the [facility], but I encouraged him to follow treatment recommendations for the endocarditis first. Plan is to redose another 4mg  suboxone now. Then tomorrow morning start suboxone 8mg  bid.

## 2017-09-16 DIAGNOSIS — M791 Myalgia, unspecified site: Secondary | ICD-10-CM

## 2017-09-16 DIAGNOSIS — M2550 Pain in unspecified joint: Secondary | ICD-10-CM

## 2017-09-16 LAB — CULTURE, BLOOD (ROUTINE X 2)
CULTURE: NO GROWTH
SPECIAL REQUESTS: ADEQUATE
Special Requests: ADEQUATE
Special Requests: ADEQUATE

## 2017-09-16 LAB — BASIC METABOLIC PANEL
ANION GAP: 9 (ref 5–15)
BUN: 9 mg/dL (ref 6–20)
CALCIUM: 7.5 mg/dL — AB (ref 8.9–10.3)
CO2: 20 mmol/L — ABNORMAL LOW (ref 22–32)
CREATININE: 0.84 mg/dL (ref 0.61–1.24)
Chloride: 103 mmol/L (ref 101–111)
Glucose, Bld: 94 mg/dL (ref 65–99)
Potassium: 4.2 mmol/L (ref 3.5–5.1)
SODIUM: 132 mmol/L — AB (ref 135–145)

## 2017-09-16 LAB — CBC
HEMATOCRIT: 25.6 % — AB (ref 39.0–52.0)
Hemoglobin: 8.7 g/dL — ABNORMAL LOW (ref 13.0–17.0)
MCH: 32 pg (ref 26.0–34.0)
MCHC: 34 g/dL (ref 30.0–36.0)
MCV: 94.1 fL (ref 78.0–100.0)
PLATELETS: 147 10*3/uL — AB (ref 150–400)
RBC: 2.72 MIL/uL — ABNORMAL LOW (ref 4.22–5.81)
RDW: 13.6 % (ref 11.5–15.5)
WBC: 12.4 10*3/uL — AB (ref 4.0–10.5)

## 2017-09-16 MED ORDER — OXYCODONE HCL 5 MG PO TABS
5.0000 mg | ORAL_TABLET | Freq: Four times a day (QID) | ORAL | Status: DC | PRN
  Administered 2017-09-16 – 2017-11-01 (×76): 5 mg via ORAL
  Filled 2017-09-16 (×83): qty 1

## 2017-09-16 MED ORDER — SALINE SPRAY 0.65 % NA SOLN
1.0000 | NASAL | Status: DC | PRN
  Filled 2017-09-16: qty 44

## 2017-09-16 NOTE — Care Management Note (Addendum)
Case Management Note  Patient Details  Name: Gary Hicks MRN: [id number] Date of Birth: 07-17-1986  Subjective/Objective:      [name]             Action/Plan: Cancelled appointment with [facility] [phone] to establish PCP for 09/23/17 at 9 am because Patient requires 6 weeks of IV antibiotics.               Expected Discharge Plan:  Home/Self Care  Discharge planning Services  CM Consult  Status of Service:  In process, will continue to follow   Additional Comments: Discussed care plan of "Suboxone" being started 09/15/2017 by [facility] Dr. Lalla Brothers, MD with Medical Director: Dr. Reynaldo Minium.  Per Dr. Reynaldo Minium "treatment must be consistent: Prescribing Provider must continue supplying Suboxone while patient remains inpatient and provide prescription and/or medication at time of discharge.  Also follow patient outpatient".   Review of chart on 09/16/17 12:45 PM  Per Lalla Brothers, MD  "advised that we can get him suboxone coverage as an outpatient through a pharmaceutical assistance program. We will help him set this up when he follows up with [place] in the [facility]".   Montel Culver BSN, RN Nurse case manager 09/16/2017, 4:23 PM

## 2017-09-16 NOTE — Progress Notes (Signed)
    I re-evaluated Gary Hicks, we started suboxone yesterday to manage his withdrawal. He is feeling much better today, no signs or symptoms of withdrawal. Pain is better as well.  His father was at the bedside, he knows about our treatment of his OUD, and I answered his questions about follow up.  1. I discontinued lorazepam as it can interact with suboxone to cause sedation. Please limit benzos now that he is on suboxone.   2. I have added oxycodone 5mg  prn for acute pain. He still has a knee effusion, probably reactive, and shoulder myositis. The short acting oxycodone may give some extra relief, NSAIDs are also a good idea. I encouraged him to move out of bed.   3. I advised that we can get him suboxone coverage as an outpatient through a pharmaceutical assistance program. We will help him set this up when he follows up with [place] in the [facility].   4. In my experience it is very unlikely he will be accepted into any SNF, so I anticipate he will be in hospital for his IV antibiotic course (which is great). I encouraged him to stay for the full course, and that we would keep him comfortable and out of withdrawal in the meantime.   I will check in on him periodically. Please feel free to call me with any questions.   Lalla Brothers, MD Pager: [phone]

## 2017-09-16 NOTE — Progress Notes (Signed)
PT Cancellation Note  Patient Details Name: Gary Hicks MRN: [id number] DOB: 1986/07/07   Cancelled Treatment:    Reason Eval/Treat Not Completed: Medical issues which prohibited therapy Began PT evaluation, however noted area on right medial thigh that was warm, swollen, and slightly red, very painful to touch. Patient reports this is new and is very anxious regarding this area. RN aware of patient status and is passing information onto MD. Hold PT pending MD examination of acute change in status, plan to return as able.    Deniece Ree PT, DPT, CBIS  Supplemental Physical Therapist [facility]   Pager [phone]

## 2017-09-16 NOTE — Progress Notes (Signed)
[facility]  Date of Admission:  09/11/2017             ASSESSMENT/PLAN  Mr. Hack appears to be improving with pain management since starting Suboxone yesterday. He continues to receive Ancef for MSSA bacteremia and tricuspid valve endocarditis associated with IVDU. Repeat blood cultures are in process. He has been afebrile and no leukocytosis. He will require 6 weeks of antibiotics starting from the time the blood cultures are negative. He continues to experience myalgias with no sources being found.   He expresses concern upon discharge regarding continuation of Suboxone and the cost of the medication. Dr. Evette Doffing has suggested possible follow up in the [facility]. I will place a social work consult to help him establish community resources to help make a smooth post-hospital transition. Will plan for Hepatitis C treatment as an outpatient.   1. Continue Ancef. 2. Monitor blood cultures - repeated today 2/15.  3. Continue pain management per primary team and Dr. Evette Doffing. 4. Social work consult placed.  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 buprenorphine-naloxone  1 tablet Sublingual BID  . famotidine  20 mg Oral Daily  . nicotine  14 mg Transdermal Daily  . potassium chloride  30 mEq Oral BID    SUBJECTIVE:  Doing okay. Started on Suboxone yesterday with improvement in the longer term symptoms but continues to have break through pain. Afebrile overnight and has been walking around  the unit.   Father is in the room and expresses concern for discharge and continuation of current medical treatment and to establish in a clinic to continue Suboxone treatment.   Allergies  Allergen Reactions  . Prednisone Hives  . Amoxicillin Hives     Review of Systems: Review of Systems  Constitutional: Negative for chills and fever.  Respiratory: Negative for cough, shortness of breath and wheezing.   Cardiovascular: Negative for chest pain and leg swelling.  Gastrointestinal: Negative for abdominal pain, constipation, nausea and vomiting.  Musculoskeletal: Positive for joint pain and myalgias. Negative for neck pain.  Skin: Negative for rash.  Neurological: Negative for headaches.      OBJECTIVE: Vitals:   09/15/17 1754 09/15/17 2043 09/16/17 0511 09/16/17 1028  BP: 116/77 118/74 137/77 111/76  Pulse: (!) 105 (!) 105 (!) 115 (!) 104  Resp: 18 20 19 18   Temp: 98.6 F (37 C) 98.6 F (37 C) 98.4 F (36.9 C) 98.3 F (36.8 C)  TempSrc: Oral Oral Oral Oral  SpO2: 98% 100% 96% 100%  Weight:  162 lb 0.6 oz (73.5 kg)    Height:       Body mass index is 23.93 kg/m.  Physical Exam  Constitutional: He is oriented to person, place, and time. No distress.  Lying in bed with head of bed up. Talking on the phone upon entering.   Cardiovascular: Normal rate, regular rhythm and intact distal pulses.  Murmur heard. Pulmonary/Chest: Breath sounds normal. No respiratory distress. He has no wheezes. He has  no rales. He exhibits no tenderness.  Abdominal: Soft. Bowel sounds are normal.  Neurological: He is alert and oriented to person, place, and time.  Skin: Skin is warm and dry.  Psychiatric: Mood, memory, affect and judgment normal.    Lab Results Lab Results  Component Value Date   WBC 12.4 (H) 09/16/2017   HGB 8.7 (L) 09/16/2017   HCT 25.6 (L) 09/16/2017   MCV 94.1 09/16/2017   PLT 147 (L) 09/16/2017    Lab Results  Component Value Date   CREATININE 0.84  09/16/2017   BUN 9 09/16/2017   NA 132 (L) 09/16/2017   K 4.2 09/16/2017   CL 103 09/16/2017   CO2 20 (L) 09/16/2017    Lab Results  Component Value Date   ALT 46 09/14/2017   AST 97 (H) 09/14/2017   ALKPHOS 91 09/14/2017   BILITOT 0.7 09/14/2017     Microbiology: Recent Results (from the past 240 hour(s))  Blood culture (routine x 2)     Status: Abnormal   Collection Time: 09/10/17  7:45 PM  Result Value Ref Range Status   Specimen Description BLOOD RIGHT HAND  Final   Special Requests IN PEDIATRIC BOTTLE Blood Culture adequate volume  Final   Culture  Setup Time   Final    GRAM POSITIVE COCCI IN CLUSTERS IN PEDIATRIC BOTTLE CRITICAL RESULT CALLED TO, READ BACK BY AND VERIFIED WITH: T RUDISILL,PHARMD AT 7782 09/11/17 BY L BENFIELD Performed at [facility], [address], [place], [place] [address]    Culture STAPHYLOCOCCUS AUREUS (A)  Final   Report Status 09/13/2017 FINAL  Final   Organism ID, Bacteria STAPHYLOCOCCUS AUREUS  Final      Susceptibility   Staphylococcus aureus - MIC*    CIPROFLOXACIN <=0.5 SENSITIVE Sensitive     ERYTHROMYCIN <=0.25 SENSITIVE Sensitive     GENTAMICIN <=0.5 SENSITIVE Sensitive     OXACILLIN 0.5 SENSITIVE Sensitive     TETRACYCLINE <=1 SENSITIVE Sensitive     VANCOMYCIN 1 SENSITIVE Sensitive     TRIMETH/SULFA <=10 SENSITIVE Sensitive     CLINDAMYCIN <=0.25 SENSITIVE Sensitive     RIFAMPIN <=0.5 SENSITIVE Sensitive     Inducible Clindamycin NEGATIVE Sensitive     * STAPHYLOCOCCUS AUREUS  Blood Culture ID Panel (Reflexed)     Status: Abnormal   Collection Time: 09/10/17  7:45 PM  Result Value Ref Range Status   Enterococcus species NOT DETECTED NOT DETECTED Final   Vancomycin resistance NOT DETECTED NOT DETECTED Final   Listeria monocytogenes NOT DETECTED NOT DETECTED Final   Staphylococcus species DETECTED (A) NOT DETECTED Final    Comment: CRITICAL RESULT CALLED TO, READ BACK BY AND VERIFIED WITH: T RUDISILL,PHARMD AT 6144  09/11/17 BY L BENFIELD    Staphylococcus aureus DETECTED (A) NOT DETECTED Final    Comment: CRITICAL RESULT CALLED TO, READ BACK BY AND VERIFIED WITH: T RUDISILL,PHARMD AT 3154 09/11/17 BY L BENFIELD Methicillin (oxacillin) susceptible Staphylococcus aureus (MSSA). Preferred therapy is anti staphylococcal beta lactam antibiotic (Cefazolin or Nafcillin), unless clinically contraindicated.    Methicillin resistance NOT DETECTED NOT DETECTED Final   Streptococcus species NOT DETECTED NOT DETECTED Final   Streptococcus agalactiae NOT DETECTED NOT DETECTED Final   Streptococcus pneumoniae NOT DETECTED NOT DETECTED Final   Streptococcus pyogenes NOT DETECTED NOT DETECTED Final   Acinetobacter baumannii NOT DETECTED NOT DETECTED Final   Enterobacteriaceae species NOT DETECTED NOT DETECTED Final   Enterobacter cloacae complex NOT DETECTED NOT DETECTED Final  Escherichia coli NOT DETECTED NOT DETECTED Final   Klebsiella oxytoca NOT DETECTED NOT DETECTED Final   Klebsiella pneumoniae NOT DETECTED NOT DETECTED Final   Proteus species NOT DETECTED NOT DETECTED Final   Serratia marcescens NOT DETECTED NOT DETECTED Final   Carbapenem resistance NOT DETECTED NOT DETECTED Final   Haemophilus influenzae NOT DETECTED NOT DETECTED Final   Neisseria meningitidis NOT DETECTED NOT DETECTED Final   Pseudomonas aeruginosa NOT DETECTED NOT DETECTED Final   Candida albicans NOT DETECTED NOT DETECTED Final   Candida glabrata NOT DETECTED NOT DETECTED Final   Candida krusei NOT DETECTED NOT DETECTED Final   Candida parapsilosis NOT DETECTED NOT DETECTED Final   Candida tropicalis NOT DETECTED NOT DETECTED Final    Comment: Performed at [facility], [place] [address] [place], [place] [address]  Blood culture (routine x 2)     Status: Abnormal   Collection Time: 09/10/17  9:50 PM  Result Value Ref Range Status   Specimen Description BLOOD LEFT ANTECUBITAL  Final   Special Requests IN PEDIATRIC BOTTLE Blood  Culture adequate volume  Final   Culture  Setup Time   Final    GRAM POSITIVE COCCI IN PEDIATRIC BOTTLE CRITICAL VALUE NOTED.  VALUE IS CONSISTENT WITH PREVIOUSLY REPORTED AND CALLED VALUE.    Culture (A)  Final    STAPHYLOCOCCUS AUREUS SUSCEPTIBILITIES PERFORMED ON PREVIOUS CULTURE WITHIN THE LAST 5 DAYS. Performed at [facility], [place] [address], [place], [place] [address]    Report Status 09/13/2017 FINAL  Final  Culture, blood (routine x 2)     Status: None (Preliminary result)   Collection Time: 09/11/17  6:35 PM  Result Value Ref Range Status   Specimen Description BLOOD LEFT ANTECUBITAL  Final   Special Requests   Final    BOTTLES DRAWN AEROBIC AND ANAEROBIC Blood Culture adequate volume   Culture   Final    NO GROWTH 4 DAYS Performed at [facility], [place] [address] [place], [place] [address]    Report Status PENDING  Incomplete  Culture, blood (routine x 2)     Status: Abnormal   Collection Time: 09/11/17  9:30 PM  Result Value Ref Range Status   Specimen Description BLOOD RIGHT HAND  Final   Special Requests IN PEDIATRIC BOTTLE Blood Culture adequate volume  Final   Culture  Setup Time   Final    GRAM POSITIVE COCCI IN CLUSTERS IN PEDIATRIC BOTTLE CRITICAL RESULT CALLED TO, READ BACK BY AND VERIFIED WITH: PHARMD B MARTIN [id number] MLM    Culture (A)  Final    STAPHYLOCOCCUS AUREUS SUSCEPTIBILITIES PERFORMED ON PREVIOUS CULTURE WITHIN THE LAST 5 DAYS. Performed at [facility], [place] [address] [place] [address], [place], [place] [address]    Report Status 09/14/2017 FINAL  Final  Blood Culture ID Panel (Reflexed)     Status: Abnormal   Collection Time: 09/11/17  9:30 PM  Result Value Ref Range Status   Enterococcus species NOT DETECTED NOT DETECTED Final   Vancomycin resistance NOT DETECTED NOT DETECTED Final   Listeria monocytogenes NOT DETECTED NOT DETECTED Final   Staphylococcus species DETECTED (A) NOT DETECTED Final    Comment: CRITICAL RESULT CALLED TO,  READ BACK BY AND VERIFIED WITH: T DANG PHARMD 1833 09/12/17 A BROWNING    Staphylococcus aureus DETECTED (A) NOT DETECTED Final    Comment: Methicillin (oxacillin) susceptible Staphylococcus aureus (MSSA). Preferred therapy is anti staphylococcal beta lactam antibiotic (Cefazolin or Nafcillin), unless clinically  contraindicated. CRITICAL RESULT CALLED TO, READ BACK BY AND VERIFIED WITH: T DANG PHARMD 2440 09/12/17 A BROWNING    Methicillin resistance NOT DETECTED NOT DETECTED Final   Streptococcus species NOT DETECTED NOT DETECTED Final   Streptococcus agalactiae NOT DETECTED NOT DETECTED Final   Streptococcus pneumoniae NOT DETECTED NOT DETECTED Final   Streptococcus pyogenes NOT DETECTED NOT DETECTED Final   Acinetobacter baumannii NOT DETECTED NOT DETECTED Final   Enterobacteriaceae species NOT DETECTED NOT DETECTED Final   Enterobacter cloacae complex NOT DETECTED NOT DETECTED Final   Escherichia coli NOT DETECTED NOT DETECTED Final   Klebsiella oxytoca NOT DETECTED NOT DETECTED Final   Klebsiella pneumoniae NOT DETECTED NOT DETECTED Final   Proteus species NOT DETECTED NOT DETECTED Final   Serratia marcescens NOT DETECTED NOT DETECTED Final   Carbapenem resistance NOT DETECTED NOT DETECTED Final   Haemophilus influenzae NOT DETECTED NOT DETECTED Final   Neisseria meningitidis NOT DETECTED NOT DETECTED Final   Pseudomonas aeruginosa NOT DETECTED NOT DETECTED Final   Candida albicans NOT DETECTED NOT DETECTED Final   Candida glabrata NOT DETECTED NOT DETECTED Final   Candida krusei NOT DETECTED NOT DETECTED Final   Candida parapsilosis NOT DETECTED NOT DETECTED Final   Candida tropicalis NOT DETECTED NOT DETECTED Final    Comment: Performed at [facility], [place]. [address] [place], [place] [address]  Culture, blood (Routine X 2) w Reflex to ID Panel     Status: Abnormal   Collection Time: 09/13/17  3:50 PM  Result Value Ref Range Status   Specimen Description BLOOD RIGHT HAND   Final   Special Requests IN PEDIATRIC BOTTLE Blood Culture adequate volume  Final   Culture  Setup Time   Final    GRAM POSITIVE COCCI IN PEDIATRIC BOTTLE CRITICAL VALUE NOTED.  VALUE IS CONSISTENT WITH PREVIOUSLY REPORTED AND CALLED VALUE.    Culture (A)  Final    STAPHYLOCOCCUS AUREUS SUSCEPTIBILITIES PERFORMED ON PREVIOUS CULTURE WITHIN THE LAST 5 DAYS. Performed at [facility], [place] [address] [place], [place] [address]    Report Status 09/16/2017 FINAL  Final  Culture, blood (Routine X 2) w Reflex to ID Panel     Status: Abnormal   Collection Time: 09/13/17  4:00 PM  Result Value Ref Range Status   Specimen Description BLOOD RIGHT ARM  Final   Special Requests IN PEDIATRIC BOTTLE Blood Culture adequate volume  Final   Culture  Setup Time   Final    GRAM POSITIVE COCCI IN CLUSTERS IN PEDIATRIC BOTTLE CRITICAL RESULT CALLED TO, READ BACK BY AND VERIFIED WITH: Carlis Stable [id number] MLM Performed at [facility], [place] [address], [place], [place] [address]    Culture STAPHYLOCOCCUS AUREUS (A)  Final   Report Status 09/16/2017 FINAL  Final   Organism ID, Bacteria STAPHYLOCOCCUS AUREUS  Final      Susceptibility   Staphylococcus aureus - MIC*    CIPROFLOXACIN <=0.5 SENSITIVE Sensitive     ERYTHROMYCIN <=0.25 SENSITIVE Sensitive     GENTAMICIN <=0.5 SENSITIVE Sensitive     OXACILLIN <=0.25 SENSITIVE Sensitive     TETRACYCLINE <=1 SENSITIVE Sensitive     VANCOMYCIN <=0.5 SENSITIVE Sensitive     TRIMETH/SULFA <=10 SENSITIVE Sensitive     CLINDAMYCIN <=0.25 SENSITIVE Sensitive     RIFAMPIN <=0.5 SENSITIVE Sensitive     Inducible Clindamycin NEGATIVE Sensitive     * STAPHYLOCOCCUS AUREUS     Terri Piedra, NP [facility] [phone] Pager  09/16/2017  11:45 AM

## 2017-09-16 NOTE — Progress Notes (Signed)
PROGRESS NOTE    Gary Hicks  AJO:[id number] DOB: 21-Apr-1986 DOA: 09/11/2017 PCP: Patient, [name] Per   Brief Narrative: This is a 32 year old man with active IV drug abuse, who presents with generalized pain. History is limited secondary to his acute illness including pain and fluctuations in alertness,his father is present at the bedside assists with history. The patient reports that the painhasbeen happening since the end of January, father reports that 2-3 days ago he was concerned the patient may have had a seizure because he was found unresponsive with some abnormal eye movements in the fetal position. The patient was incarcerated and released 3-4 weeks ago. Patient admits using IV methamphetamine as well as heroin in the past 24 hours prior to admission. Reports global polyarthralgias including his bilateral feet bilateral knees bilateral hips and shoulders. Also reports fevers and chills generalized myalgias. Acutely worsened today for what she calls father who brought him to seek medical attention.     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  Continue with ancef.  Afebrile last 24 hours.  Blood culture 2-11 Staphylococcus MSSA Repeated blood culture 2-12 Staph  Repeated Blood culture 2-15  2-Multiples joints pain; right shoulder MRI today.  -MRI left knee ordered.  -MRI hip;  Mild soft tissue anasarca about the pelvis and both hips.  No drainable fluid collection or abscess. Nonspecific mild intramuscular edema about both hips question myositis. Small bilateral hip joint effusions without frank bone destruction or marrow signal abnormalities to suggest a septic joint.  On IV dilaudid.  MRI Right shoulder; Mild supraspinatus and infraspinatus muscle edema suggestive of Myositis.  Mild subacromial/subdeltoid bursitis.  3-Sepsis secondary to number one. On IV fluids, IV antibiotics.   4-Heroin use;  IM teaching service consulted , to consider suboxone.  I really appreciate Dr Damita Dunnings help with Suboxone.  On Suboxone BID>   5-Convulsions Likely related to drug use We will consider MRI after head CT Seizure precautions PRN Ativan He is weak , generalized difficult to do neuro exam, he is complaining of left hand numbness, 3 finger. Will get MRI brain.    6-Anemia; check anemia panel in am.  Thrombocytopenia. Monitor closely   Hyponatremia; IV fluids.   DVT prophylaxis: will need to start heparin if hb stable.  Code Status: full code.  Family Communication: mother at bedside. 2-13  Disposition Plan: to be determine    Consultants:   ID  Ortho    Procedures:  ECHo;  - Left ventricle: The cavity size was normal. Systolic function was   normal. The estimated ejection fraction was in the range of 60%   to 65%. Wall motion was normal; there were no regional wall   motion abnormalities. - Left atrium: The atrium was mildly dilated. - Right ventricle: The cavity size was moderately dilated. Wall   thickness was normal. - Right atrium: The atrium was moderately dilated. - Tricuspid valve: There was a large, 1.1 cm (W)  x 1.8 cm (L),   multilobulated, highly mobile vegetation on the right ventricular   aspect of the anterior leaflet. There was moderate-severe   regurgitation. - Pulmonary arteries: Systolic pressure was mildly increased. PA   peak pressure: 44 mm Hg (S). - Pericardium, extracardiac: A trivial  pericardial effusion was    identified posterior to the heart.  Antimicrobials: Ancef 2-11  Subjective: Generalized weakness, movement limitation due to pain.  He report numbness left hand for few days now.   Objective: Vitals:   09/15/17 1754 09/15/17 2043 09/16/17 0511 09/16/17 1028  BP: 116/77 118/74 137/77 111/76  Pulse: (!) 105 (!) 105 (!) 115 (!) 104  Resp: 18 20 19 18   Temp: 98.6 F (37 C) 98.6 F (37 C) 98.4 F (36.9 C) 98.3 F (36.8 C)  TempSrc: Oral Oral Oral Oral  SpO2: 98% 100% 96% 100%  Weight:  73.5 kg (162 lb 0.6 oz)    Height:        Intake/Output Summary (Last 24 hours) at 09/16/2017 1243 Last data filed at 09/16/2017 0617 Gross per 24 hour  Intake 3629.17 ml  Output 3775 ml  Net -145.83 ml   Filed Weights   09/12/17 2240 09/14/17 2032 09/15/17 2043  Weight: 64 kg (141 lb 2 oz) 73.8 kg (162 lb 11.2 oz) 73.5 kg (162 lb 0.6 oz)    Examination:  General exam; NAD, sleepy  Respiratory system: CTA Cardiovascular system: S 1, S 2 RRR Gastrointestinal system: BS present, soft, nt Central nervous system: non focal.  Extremities: left knee with less redness.  Skin: petechia rash    Data Reviewed: I have personally reviewed following labs and imaging studies  CBC: Recent Labs  Lab 09/11/17 1826 09/12/17 0629 09/13/17 0914 09/14/17 0806 09/16/17 0633  WBC 16.1* 9.3 8.1 13.9* 12.4*  NEUTROABS 12.7*  --  5.8 11.7*  --   HGB 9.1* 9.7* 8.7* 8.0* 8.7*  HCT 26.9* 28.7* 25.3* 23.9* 25.6*  MCV 92.1 93.5 93.7 93.4 94.1  PLT 73* 52* 52* 66* 993*   Basic Metabolic Panel: Recent Labs  Lab 09/11/17 1826 09/11/17 2100 09/12/17 0629 09/13/17 0914 09/14/17 0806 09/16/17 0633  NA 130*  --  135 132* 130* 132*  K 3.4*  --  3.5 3.3* 4.2 4.2  CL 96*  --  102 102 104 103  CO2 23  --  22 19* 19* 20*  GLUCOSE 110*  --  112* 143* 105* 94  BUN 16  --  11 9 9 9   CREATININE 1.09  --  0.84 0.99 0.81 0.84  CALCIUM 7.7*  --  7.5* 7.1* 7.0* 7.5*  MG  --  1.7   --   --   --   --    GFR: Estimated Creatinine Clearance: 127.4 mL/min (by C-G formula based on SCr of 0.84 mg/dL). Liver Function Tests: Recent Labs  Lab 09/10/17 1811 09/11/17 0041 09/11/17 1826 09/14/17 0806  AST 45* 37 36 97*  ALT 42 34 28 46  ALKPHOS 148* 83 76 91  BILITOT 1.2 0.9 1.0 0.7  PROT 6.6 5.5* 5.7* 5.2*  ALBUMIN 2.6* 2.1* 1.9* 1.4*   No results for input(s): LIPASE, AMYLASE in the last 168 hours. No results for input(s): AMMONIA in the last 168 hours. Coagulation Profile: Recent Labs  Lab 09/11/17 0041 09/11/17 1826  INR 1.18 1.30   Cardiac Enzymes: No results for input(s): CKTOTAL, CKMB, CKMBINDEX, TROPONINI in the last 168 hours. BNP (last 3 results) No results for input(s):  PROBNP in the last 8760 hours. HbA1C: No results for input(s): HGBA1C in the last 72 hours. CBG: Recent Labs  Lab 09/11/17 1218  GLUCAP 97   Lipid Profile: No results for input(s): CHOL, HDL, LDLCALC, TRIG, CHOLHDL, LDLDIRECT in the last 72 hours. Thyroid Function Tests: No results for input(s): TSH, T4TOTAL, FREET4, T3FREE, THYROIDAB in the last 72 hours. Anemia Panel: No results for input(s): VITAMINB12, FOLATE, FERRITIN, TIBC, IRON, RETICCTPCT in the last 72 hours. Sepsis Labs: Recent Labs  Lab 09/10/17 2005 09/11/17 1850 09/11/17 2127 09/12/17 0629  LATICACIDVEN 1.89 2.72* 2.8* 1.4    Recent Results (from the past 240 hour(s))  Blood culture (routine x 2)     Status: Abnormal   Collection Time: 09/10/17  7:45 PM  Result Value Ref Range Status   Specimen Description BLOOD RIGHT HAND  Final   Special Requests IN PEDIATRIC BOTTLE Blood Culture adequate volume  Final   Culture  Setup Time   Final    GRAM POSITIVE COCCI IN CLUSTERS IN PEDIATRIC BOTTLE CRITICAL RESULT CALLED TO, READ BACK BY AND VERIFIED WITH: T RUDISILL,PHARMD AT 7322 09/11/17 BY L BENFIELD Performed at [facility], [place] [address] [place], [place] [address]    Culture STAPHYLOCOCCUS AUREUS  (A)  Final   Report Status 09/13/2017 FINAL  Final   Organism ID, Bacteria STAPHYLOCOCCUS AUREUS  Final      Susceptibility   Staphylococcus aureus - MIC*    CIPROFLOXACIN <=0.5 SENSITIVE Sensitive     ERYTHROMYCIN <=0.25 SENSITIVE Sensitive     GENTAMICIN <=0.5 SENSITIVE Sensitive     OXACILLIN 0.5 SENSITIVE Sensitive     TETRACYCLINE <=1 SENSITIVE Sensitive     VANCOMYCIN 1 SENSITIVE Sensitive     TRIMETH/SULFA <=10 SENSITIVE Sensitive     CLINDAMYCIN <=0.25 SENSITIVE Sensitive     RIFAMPIN <=0.5 SENSITIVE Sensitive     Inducible Clindamycin NEGATIVE Sensitive     * STAPHYLOCOCCUS AUREUS  Blood Culture ID Panel (Reflexed)     Status: Abnormal   Collection Time: 09/10/17  7:45 PM  Result Value Ref Range Status   Enterococcus species NOT DETECTED NOT DETECTED Final   Vancomycin resistance NOT DETECTED NOT DETECTED Final   Listeria monocytogenes NOT DETECTED NOT DETECTED Final   Staphylococcus species DETECTED (A) NOT DETECTED Final    Comment: CRITICAL RESULT CALLED TO, READ BACK BY AND VERIFIED WITH: T RUDISILL,PHARMD AT 7062 09/11/17 BY L BENFIELD    Staphylococcus aureus DETECTED (A) NOT DETECTED Final    Comment: CRITICAL RESULT CALLED TO, READ BACK BY AND VERIFIED WITH: T RUDISILL,PHARMD AT 3762 09/11/17 BY L BENFIELD Methicillin (oxacillin) susceptible Staphylococcus aureus (MSSA). Preferred therapy is anti staphylococcal beta lactam antibiotic (Cefazolin or Nafcillin), unless clinically contraindicated.    Methicillin resistance NOT DETECTED NOT DETECTED Final   Streptococcus species NOT DETECTED NOT DETECTED Final   Streptococcus agalactiae NOT DETECTED NOT DETECTED Final   Streptococcus pneumoniae NOT DETECTED NOT DETECTED Final   Streptococcus pyogenes NOT DETECTED NOT DETECTED Final   Acinetobacter baumannii NOT DETECTED NOT DETECTED Final   Enterobacteriaceae species NOT DETECTED NOT DETECTED Final   Enterobacter cloacae complex NOT DETECTED NOT DETECTED Final    Escherichia coli NOT DETECTED NOT DETECTED Final   Klebsiella oxytoca NOT DETECTED NOT DETECTED Final   Klebsiella pneumoniae NOT DETECTED NOT DETECTED Final   Proteus species NOT DETECTED NOT DETECTED Final   Serratia marcescens NOT DETECTED NOT DETECTED Final   Carbapenem resistance NOT DETECTED NOT DETECTED Final  Haemophilus influenzae NOT DETECTED NOT DETECTED Final   Neisseria meningitidis NOT DETECTED NOT DETECTED Final   Pseudomonas aeruginosa NOT DETECTED NOT DETECTED Final   Candida albicans NOT DETECTED NOT DETECTED Final   Candida glabrata NOT DETECTED NOT DETECTED Final   Candida krusei NOT DETECTED NOT DETECTED Final   Candida parapsilosis NOT DETECTED NOT DETECTED Final   Candida tropicalis NOT DETECTED NOT DETECTED Final    Comment: Performed at [facility], [place] [address], [place], [place] [address]  Blood culture (routine x 2)     Status: Abnormal   Collection Time: 09/10/17  9:50 PM  Result Value Ref Range Status   Specimen Description BLOOD LEFT ANTECUBITAL  Final   Special Requests IN PEDIATRIC BOTTLE Blood Culture adequate volume  Final   Culture  Setup Time   Final    GRAM POSITIVE COCCI IN PEDIATRIC BOTTLE CRITICAL VALUE NOTED.  VALUE IS CONSISTENT WITH PREVIOUSLY REPORTED AND CALLED VALUE.    Culture (A)  Final    STAPHYLOCOCCUS AUREUS SUSCEPTIBILITIES PERFORMED ON PREVIOUS CULTURE WITHIN THE LAST 5 DAYS. Performed at [facility], [place] [address] [place], [place] [address]    Report Status 09/13/2017 FINAL  Final  Culture, blood (routine x 2)     Status: None (Preliminary result)   Collection Time: 09/11/17  6:35 PM  Result Value Ref Range Status   Specimen Description BLOOD LEFT ANTECUBITAL  Final   Special Requests   Final    BOTTLES DRAWN AEROBIC AND ANAEROBIC Blood Culture adequate volume   Culture   Final    NO GROWTH 4 DAYS Performed at [facility], [place] [address] [place], [place] [address]    Report Status PENDING   Incomplete  Culture, blood (routine x 2)     Status: Abnormal   Collection Time: 09/11/17  9:30 PM  Result Value Ref Range Status   Specimen Description BLOOD RIGHT HAND  Final   Special Requests IN PEDIATRIC BOTTLE Blood Culture adequate volume  Final   Culture  Setup Time   Final    GRAM POSITIVE COCCI IN CLUSTERS IN PEDIATRIC BOTTLE CRITICAL RESULT CALLED TO, READ BACK BY AND VERIFIED WITH: PHARMD B MARTIN [id number] MLM    Culture (A)  Final    STAPHYLOCOCCUS AUREUS SUSCEPTIBILITIES PERFORMED ON PREVIOUS CULTURE WITHIN THE LAST 5 DAYS. Performed at [facility], [place] [address], [place], [place] [address]    Report Status 09/14/2017 FINAL  Final  Blood Culture ID Panel (Reflexed)     Status: Abnormal   Collection Time: 09/11/17  9:30 PM  Result Value Ref Range Status   Enterococcus species NOT DETECTED NOT DETECTED Final   Vancomycin resistance NOT DETECTED NOT DETECTED Final   Listeria monocytogenes NOT DETECTED NOT DETECTED Final   Staphylococcus species DETECTED (A) NOT DETECTED Final    Comment: CRITICAL RESULT CALLED TO, READ BACK BY AND VERIFIED WITH: T DANG PHARMD 1833 09/12/17 A BROWNING    Staphylococcus aureus DETECTED (A) NOT DETECTED Final    Comment: Methicillin (oxacillin) susceptible Staphylococcus aureus (MSSA). Preferred therapy is anti staphylococcal beta lactam antibiotic (Cefazolin or Nafcillin), unless clinically contraindicated. CRITICAL RESULT CALLED TO, READ BACK BY AND VERIFIED WITH: T DANG PHARMD 1833 09/12/17 A BROWNING    Methicillin resistance NOT DETECTED NOT DETECTED Final   Streptococcus species NOT DETECTED NOT DETECTED Final   Streptococcus agalactiae NOT DETECTED NOT DETECTED Final   Streptococcus pneumoniae NOT DETECTED NOT DETECTED Final  Streptococcus pyogenes NOT DETECTED NOT DETECTED Final   Acinetobacter baumannii NOT DETECTED NOT DETECTED Final   Enterobacteriaceae species NOT DETECTED NOT DETECTED Final   Enterobacter cloacae  complex NOT DETECTED NOT DETECTED Final   Escherichia coli NOT DETECTED NOT DETECTED Final   Klebsiella oxytoca NOT DETECTED NOT DETECTED Final   Klebsiella pneumoniae NOT DETECTED NOT DETECTED Final   Proteus species NOT DETECTED NOT DETECTED Final   Serratia marcescens NOT DETECTED NOT DETECTED Final   Carbapenem resistance NOT DETECTED NOT DETECTED Final   Haemophilus influenzae NOT DETECTED NOT DETECTED Final   Neisseria meningitidis NOT DETECTED NOT DETECTED Final   Pseudomonas aeruginosa NOT DETECTED NOT DETECTED Final   Candida albicans NOT DETECTED NOT DETECTED Final   Candida glabrata NOT DETECTED NOT DETECTED Final   Candida krusei NOT DETECTED NOT DETECTED Final   Candida parapsilosis NOT DETECTED NOT DETECTED Final   Candida tropicalis NOT DETECTED NOT DETECTED Final    Comment: Performed at [facility], [place] [address], [place], [place] [address]  Culture, blood (Routine X 2) w Reflex to ID Panel     Status: Abnormal   Collection Time: 09/13/17  3:50 PM  Result Value Ref Range Status   Specimen Description BLOOD RIGHT HAND  Final   Special Requests IN PEDIATRIC BOTTLE Blood Culture adequate volume  Final   Culture  Setup Time   Final    GRAM POSITIVE COCCI IN PEDIATRIC BOTTLE CRITICAL VALUE NOTED.  VALUE IS CONSISTENT WITH PREVIOUSLY REPORTED AND CALLED VALUE.    Culture (A)  Final    STAPHYLOCOCCUS AUREUS SUSCEPTIBILITIES PERFORMED ON PREVIOUS CULTURE WITHIN THE LAST 5 DAYS. Performed at [facility], [place] [address] [place], [place] [address]    Report Status 09/16/2017 FINAL  Final  Culture, blood (Routine X 2) w Reflex to ID Panel     Status: Abnormal   Collection Time: 09/13/17  4:00 PM  Result Value Ref Range Status   Specimen Description BLOOD RIGHT ARM  Final   Special Requests IN PEDIATRIC BOTTLE Blood Culture adequate volume  Final   Culture  Setup Time   Final    GRAM POSITIVE COCCI IN CLUSTERS IN PEDIATRIC BOTTLE CRITICAL RESULT CALLED TO,  READ BACK BY AND VERIFIED WITH: Carlis Stable [id number] MLM Performed at [facility], [place] [address] [place], [place] [address]    Culture STAPHYLOCOCCUS AUREUS (A)  Final   Report Status 09/16/2017 FINAL  Final   Organism ID, Bacteria STAPHYLOCOCCUS AUREUS  Final      Susceptibility   Staphylococcus aureus - MIC*    CIPROFLOXACIN <=0.5 SENSITIVE Sensitive     ERYTHROMYCIN <=0.25 SENSITIVE Sensitive     GENTAMICIN <=0.5 SENSITIVE Sensitive     OXACILLIN <=0.25 SENSITIVE Sensitive     TETRACYCLINE <=1 SENSITIVE Sensitive     VANCOMYCIN <=0.5 SENSITIVE Sensitive     TRIMETH/SULFA <=10 SENSITIVE Sensitive     CLINDAMYCIN <=0.25 SENSITIVE Sensitive     RIFAMPIN <=0.5 SENSITIVE Sensitive     Inducible Clindamycin NEGATIVE Sensitive     * STAPHYLOCOCCUS AUREUS         Radiology Studies: Mr Shoulder Right Wo Contrast  Result Date: 09/15/2017 CLINICAL DATA:  Fevers, chills, generalized myalgias, and polyarthralgia, involving the bilateral shoulders, hips, knees, and feet. History of IV drug abuse. EXAM: MRI OF THE RIGHT SHOULDER WITHOUT CONTRAST TECHNIQUE: Multiplanar, multisequence MR imaging of the shoulder was performed. No intravenous contrast was administered. COMPARISON:  Right shoulder x-rays dated September 11, 2017. FINDINGS: Despite efforts by the technologist and patient, motion artifact is present on today's exam and could not be eliminated. This reduces exam sensitivity and specificity. Rotator cuff: Intact supraspinatus, infraspinatus, teres minor and subscapularis tendons. Muscles: Mild edema within the supraspinatus muscle and along the posterior aspect of the infraspinatus muscle. No muscle atrophy. Biceps long head:  Intact and normally positioned. Acromioclavicular Joint: Normal acromioclavicular joint. Type II acromion. Small subacromial/subdeltoid bursal fluid. Glenohumeral Joint: No joint effusion. No chondral defect. Labrum: Grossly intact, but evaluation is  limited by lack of intraarticular fluid. Bones:  No marrow abnormality, fracture or dislocation. Other: None. IMPRESSION: 1. Mild supraspinatus and infraspinatus muscle edema suggestive of myositis. 2. Mild subacromial/subdeltoid bursitis. 3. No drainable fluid collection.  No glenohumeral joint effusion. Electronically Signed   By: Titus Dubin M.D.   On: 09/15/2017 08:22        Scheduled Meds: . buprenorphine-naloxone  1 tablet Sublingual BID  . famotidine  20 mg Oral Daily  . nicotine  14 mg Transdermal Daily  . potassium chloride  30 mEq Oral BID   Continuous Infusions: . sodium chloride 125 mL/hr at 09/16/17 1104  .  ceFAZolin (ANCEF) IV Stopped (09/16/17 0630)     LOS: 5 days    Time spent: 35 minutes.     Elmarie Shiley, MD [facility] Pager [phone]  If 7PM-7AM, please contact night-coverage [email or web address] Password [facility] 09/16/2017, 12:43 PM

## 2017-09-17 MED ORDER — OXYCODONE HCL 5 MG PO TABS
5.0000 mg | ORAL_TABLET | Freq: Once | ORAL | Status: AC
  Administered 2017-09-17: 5 mg via ORAL
  Filled 2017-09-17: qty 1

## 2017-09-17 NOTE — Evaluation (Signed)
Physical Therapy Evaluation Patient Details Name: Gary Hicks MRN: [id number] DOB: Aug 11, 1985 Today's Date: 09/17/2017   History of Present Illness  32yo male with active IV drug use presenting with generalized pain. Family is concerned about seizures due to finding him in fetal position with abnormal eye movements. He reports global polyarthralgia, use of heroin and methamphetamines in the past 24 hours at time of admission. Diagnosed with staph bacteremia, severe sepsis. PMH bacterial endocarditis, heroin abuse, possible Hepatitis C, opiate abuse, polysubstance abuse, seizures   Clinical Impression  Pt was able to get up and walk a short distance into the hallway with RW, sit up in recliner and preform L LE exercise program.  He is limited by left knee pain, but felt better after mobility.  Encouraged exercises (ankle pumps, quad sets, and heel slides) throughout the [place].  I am hopeful he can progress to crutches or a cane with continued therapy.  PT to follow acutely until d/c confirmed.     Follow Up Recommendations Supervision for mobility/OOB    Equipment Recommendations  Rolling walker with 5" wheels;Crutches;Cane;Other (comment)(TBD as gait progresses)    Recommendations for Other Services    NA    Precautions / Restrictions Precautions Precautions: None Restrictions Weight Bearing Restrictions: No      Mobility  Bed Mobility Overal bed mobility: Needs Assistance Bed Mobility: Supine to Sit     Supine to sit: Supervision     General bed mobility comments: supervision for safety as pt taking extensive extra time to move EOB.  Pt using his hands to move his left leg to EOB.   Transfers Overall transfer level: Needs assistance Equipment used: Rolling walker (2 wheeled) Transfers: Sit to/from Stand Sit to Stand: Min guard;From elevated surface         General transfer comment: elevated be dto give pt an advantage on his first stand.  Min guard assist at trunk and to  stabilize RW for safety during transitions.  Assist needed to help pt lift left leg/kick it out to avoid significant knee flexion on the way down to sitting.   Ambulation/Gait Ambulation/Gait assistance: Min guard Ambulation Distance (Feet): 75 Feet Assistive device: Rolling walker (2 wheeled) Gait Pattern/deviations: Step-to pattern;Antalgic Gait velocity: decreased Gait velocity interpretation: Below normal speed for age/gender General Gait Details: Pt started with hop to pattern using RW as he could not put much weight due to pain on his left knee, then he transitioned to partial and nearly full WB by the end of the walk.  Moderately antalgic gait pattern min guard assist for safety, balance, and steering RW during gait.          Balance Overall balance assessment: Needs assistance Sitting-balance support: Feet supported;No upper extremity supported Sitting balance-Leahy Scale: Good     Standing balance support: Bilateral upper extremity supported;Single extremity supported Standing balance-Leahy Scale: Fair                               Pertinent Vitals/Pain Pain Assessment: Faces Faces Pain Scale: Hurts whole lot Pain Location: all o fhis joints, esepcially L knee Pain Descriptors / Indicators: Grimacing;Guarding Pain Intervention(s): Limited activity within patient's tolerance;Monitored during session;Repositioned    Home Living Family/patient expects to be discharged to:: Private residence Living Arrangements: Spouse/significant other Available Help at Discharge: Family Type of Home: Other(Comment)(condo ) Home Access: Level entry     Home Layout: One level Home Equipment: None  Prior Function Level of Independence: Independent               Hand Dominance   Dominant Hand: Right    Extremity/Trunk Assessment   Upper Extremity Assessment Upper Extremity Assessment: Overall WFL for tasks assessed(painful R shoulder, but functional )     Lower Extremity Assessment Lower Extremity Assessment: LLE deficits/detail LLE Deficits / Details: left knee particularly painful with limited ROM/increased edema.  Pt with at least 3/5 ankle, 3-/5 knee ext and 3-/5 hip flexion    Cervical / Trunk Assessment Cervical / Trunk Assessment: Normal  Communication   Communication: No difficulties  Cognition Arousal/Alertness: Awake/alert Behavior During Therapy: WFL for tasks assessed/performed Overall Cognitive Status: Within Functional Limits for tasks assessed                                           Exercises Total Joint Exercises Ankle Circles/Pumps: AROM;Both;20 reps Quad Sets: AROM;Left;10 reps Heel Slides: AAROM;Left;10 reps   Assessment/Plan    PT Assessment Patient needs continued PT services  PT Problem List Decreased strength;Decreased range of motion;Decreased activity tolerance;Decreased balance;Decreased mobility;Decreased knowledge of use of DME;Pain       PT Treatment Interventions DME instruction;Gait training;Stair training;Functional mobility training;Therapeutic activities;Therapeutic exercise;Balance training;Patient/family education;Manual techniques;Modalities    PT Goals (Current goals can be found in the Care Plan section)  Acute Rehab PT Goals Patient Stated Goal: to decrease his pain PT Goal Formulation: With patient/family Time For Goal Achievement: 10/01/17 Potential to Achieve Goals: Good    Frequency Min 3X/week           AM-PAC PT "6 Clicks" Daily Activity  Outcome Measure Difficulty turning over in bed (including adjusting bedclothes, sheets and blankets)?: A Little Difficulty moving from lying on back to sitting on the side of the bed? : A Little Difficulty sitting down on and standing up from a chair with arms (e.g., wheelchair, bedside commode, etc,.)?: Unable Help needed moving to and from a bed to chair (including a wheelchair)?: A Little Help needed walking in  hospital room?: A Little Help needed climbing 3-5 steps with a railing? : A Lot 6 Click Score: 15    End of Session Equipment Utilized During Treatment: Gait belt Activity Tolerance: Patient limited by pain Patient left: in chair;with call bell/phone within reach;with family/visitor present Nurse Communication: Mobility status PT Visit Diagnosis: Muscle weakness (generalized) (M62.81);Difficulty in walking, not elsewhere classified (R26.2);Pain Pain - Right/Left: Left Pain - part of body: Knee    Time: 0454-0981 PT Time Calculation (min) (ACUTE ONLY): 30 min   Charges:         Wells Guiles B. [place] Jarnagin, PT, DPT [phone]   PT Evaluation $PT Eval Moderate Complexity: 1 Mod PT Treatments $Gait Training: 8-22 mins   09/17/2017, 11:56 AM

## 2017-09-17 NOTE — Progress Notes (Signed)
PROGRESS NOTE    Gary Hicks  ZOX:[id number] DOB: [birth year] DOA: 09/11/2017 PCP: Patient, [name] Per   Brief Narrative: This is a 32 year old man with active IV drug abuse, who presents with generalized pain. History is limited secondary to his acute illness including pain and fluctuations in alertness,his father is present at the bedside assists with history. The patient reports that the painhasbeen happening since the end of January, father reports that 2-3 days ago he was concerned the patient may have had a seizure because he was found unresponsive with some abnormal eye movements in the fetal position. The patient was incarcerated and released 3-4 weeks ago. Patient admits using IV methamphetamine as well as heroin in the past 24 hours prior to admission. Reports global polyarthralgias including his bilateral feet bilateral knees bilateral hips and shoulders. Also reports fevers and chills generalized myalgias. Acutely worsened today for what she calls father who brought him to seek medical attention.     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  Continue with ancef.  Afebrile last 24 hours.  Blood culture 2-11 Staphylococcus MSSA Repeated blood culture 2-12 Staph  Repeated Blood culture 2-15 pending  2-Multiples joints pain;  -MRI left knee ordered.  -MRI hip;  Mild soft tissue anasarca about the pelvis and both hips. No drainable fluid  collection or abscess. Nonspecific mild intramuscular edema about both hips question myositis. Small bilateral hip joint effusions without frank bone destruction or marrow signal abnormalities to suggest a septic joint.  On IV dilaudid.  MRI Right shoulder; Mild supraspinatus and infraspinatus muscle edema suggestive of Myositis.  Mild subacromial/subdeltoid bursitis. Pain management.   3-Sepsis secondary to number one. On IV fluids, IV antibiotics.   4-Heroin use;  IM teaching service consulted , to consider suboxone.  I really appreciate Dr Damita Dunnings help with Suboxone.  On Suboxone BID>   5-Convulsions Likely related to drug use Seizure precautions PRN Ativan He is weak , generalized difficult to do neuro exam, he is complaining of left hand numbness, 3 finger. Will get MRI brain.     6-Anemia; check anemia panel in am.   Thrombocytopenia. Monitor closely   Hyponatremia; IV fluids.   Hepatitis c antibody positive;    DVT prophylaxis: will need to start heparin if hb stable.  Code Status: full code.  Family Communication: mother at bedside. 2-13  Disposition Plan: to be determine    Consultants:   ID  Ortho    Procedures:  ECHo;  - Left ventricle: The cavity size was normal. Systolic function was   normal. The estimated ejection fraction was in the range of 60%   to 65%. Wall motion was normal; there were no regional wall   motion abnormalities. - Left atrium: The atrium was mildly dilated. - Right ventricle: The cavity size was moderately dilated. Wall   thickness was normal. - Right atrium: The atrium was moderately dilated. - Tricuspid valve: There was a large, 1.1  cm (W) x 1.8 cm (L),   multilobulated, highly mobile vegetation on the right ventricular   aspect of the anterior leaflet. There was moderate-severe   regurgitation. - Pulmonary arteries: Systolic pressure was mildly increased. PA   peak pressure: 44 mm Hg (S). - Pericardium, extracardiac: A  trivial pericardial effusion was    identified posterior to the heart.  Antimicrobials: Ancef 2-11  Subjective: He is complaining of left knee pain, just work with PT.  Also complaints of headaches.   Objective: Vitals:   09/16/17 1028 09/16/17 2115 09/17/17 0348 09/17/17 1131  BP: 111/76 114/80 126/81 113/70  Pulse: (!) 104 (!) 111 99 96  Resp: 18 19 20 20   Temp: 98.3 F (36.8 C) (!) 100.9 F (38.3 C) 98.5 F (36.9 C) 98.3 F (36.8 C)  TempSrc: Oral Oral Oral Oral  SpO2: 100% 99% 99% 99%  Weight:  73.1 kg (161 lb 2.5 oz)    Height:        Intake/Output Summary (Last 24 hours) at 09/17/2017 1400 Last data filed at 09/17/2017 1129 Gross per 24 hour  Intake 4204.17 ml  Output 3975 ml  Net 229.17 ml   Filed Weights   09/14/17 2032 09/15/17 2043 09/16/17 2115  Weight: 73.8 kg (162 lb 11.2 oz) 73.5 kg (162 lb 0.6 oz) 73.1 kg (161 lb 2.5 oz)    Examination:  General exam; NAD Respiratory system: CTA Cardiovascular system: S 1, S 2 RRR Gastrointestinal system: BS present, soft, nt Central nervous system: Non focal.  Extremities: Left knee with mild swelling  Skin: petechia rash    Data Reviewed: I have personally reviewed following labs and imaging studies  CBC: Recent Labs  Lab 09/11/17 1826 09/12/17 0629 09/13/17 0914 09/14/17 0806 09/16/17 0633  WBC 16.1* 9.3 8.1 13.9* 12.4*  NEUTROABS 12.7*  --  5.8 11.7*  --   HGB 9.1* 9.7* 8.7* 8.0* 8.7*  HCT 26.9* 28.7* 25.3* 23.9* 25.6*  MCV 92.1 93.5 93.7 93.4 94.1  PLT 73* 52* 52* 66* 160*   Basic Metabolic Panel: Recent Labs  Lab 09/11/17 1826 09/11/17 2100 09/12/17 0629 09/13/17 0914 09/14/17 0806 09/16/17 0633  NA 130*  --  135 132* 130* 132*  K 3.4*  --  3.5 3.3* 4.2 4.2  CL 96*  --  102 102 104 103  CO2 23  --  22 19* 19* 20*  GLUCOSE 110*  --  112* 143* 105* 94  BUN 16  --  11 9 9 9   CREATININE 1.09  --  0.84 0.99 0.81 0.84  CALCIUM 7.7*  --  7.5* 7.1* 7.0* 7.5*  MG  --  1.7  --   --   --    --    GFR: Estimated Creatinine Clearance: 127.4 mL/min (by C-G formula based on SCr of 0.84 mg/dL). Liver Function Tests: Recent Labs  Lab 09/10/17 1811 09/11/17 0041 09/11/17 1826 09/14/17 0806  AST 45* 37 36 97*  ALT 42 34 28 46  ALKPHOS 148* 83 76 91  BILITOT 1.2 0.9 1.0 0.7  PROT 6.6 5.5* 5.7* 5.2*  ALBUMIN 2.6* 2.1* 1.9* 1.4*   No results for input(s): LIPASE, AMYLASE in the last 168 hours. No results for input(s): AMMONIA in the last 168 hours. Coagulation Profile: Recent Labs  Lab 09/11/17 0041 09/11/17 1826  INR 1.18 1.30   Cardiac Enzymes: No results for input(s): CKTOTAL, CKMB, CKMBINDEX, TROPONINI in the last 168 hours. BNP (last 3 results) No results for input(s): PROBNP in  the last 8760 hours. HbA1C: No results for input(s): HGBA1C in the last 72 hours. CBG: Recent Labs  Lab 09/11/17 1218  GLUCAP 97   Lipid Profile: No results for input(s): CHOL, HDL, LDLCALC, TRIG, CHOLHDL, LDLDIRECT in the last 72 hours. Thyroid Function Tests: No results for input(s): TSH, T4TOTAL, FREET4, T3FREE, THYROIDAB in the last 72 hours. Anemia Panel: No results for input(s): VITAMINB12, FOLATE, FERRITIN, TIBC, IRON, RETICCTPCT in the last 72 hours. Sepsis Labs: Recent Labs  Lab 09/10/17 2005 09/11/17 1850 09/11/17 2127 09/12/17 0629  LATICACIDVEN 1.89 2.72* 2.8* 1.4    Recent Results (from the past 240 hour(s))  Blood culture (routine x 2)     Status: Abnormal   Collection Time: 09/10/17  7:45 PM  Result Value Ref Range Status   Specimen Description BLOOD RIGHT HAND  Final   Special Requests IN PEDIATRIC BOTTLE Blood Culture adequate volume  Final   Culture  Setup Time   Final    GRAM POSITIVE COCCI IN CLUSTERS IN PEDIATRIC BOTTLE CRITICAL RESULT CALLED TO, READ BACK BY AND VERIFIED WITH: T RUDISILL,PHARMD AT 3244 09/11/17 BY L BENFIELD Performed at [facility], [place] [address] [place], [place] [address]    Culture STAPHYLOCOCCUS AUREUS (A)  Final    Report Status 09/13/2017 FINAL  Final   Organism ID, Bacteria STAPHYLOCOCCUS AUREUS  Final      Susceptibility   Staphylococcus aureus - MIC*    CIPROFLOXACIN <=0.5 SENSITIVE Sensitive     ERYTHROMYCIN <=0.25 SENSITIVE Sensitive     GENTAMICIN <=0.5 SENSITIVE Sensitive     OXACILLIN 0.5 SENSITIVE Sensitive     TETRACYCLINE <=1 SENSITIVE Sensitive     VANCOMYCIN 1 SENSITIVE Sensitive     TRIMETH/SULFA <=10 SENSITIVE Sensitive     CLINDAMYCIN <=0.25 SENSITIVE Sensitive     RIFAMPIN <=0.5 SENSITIVE Sensitive     Inducible Clindamycin NEGATIVE Sensitive     * STAPHYLOCOCCUS AUREUS  Blood Culture ID Panel (Reflexed)     Status: Abnormal   Collection Time: 09/10/17  7:45 PM  Result Value Ref Range Status   Enterococcus species NOT DETECTED NOT DETECTED Final   Vancomycin resistance NOT DETECTED NOT DETECTED Final   Listeria monocytogenes NOT DETECTED NOT DETECTED Final   Staphylococcus species DETECTED (A) NOT DETECTED Final    Comment: CRITICAL RESULT CALLED TO, READ BACK BY AND VERIFIED WITH: T RUDISILL,PHARMD AT 2536 09/11/17 BY L BENFIELD    Staphylococcus aureus DETECTED (A) NOT DETECTED Final    Comment: CRITICAL RESULT CALLED TO, READ BACK BY AND VERIFIED WITH: T RUDISILL,PHARMD AT 6440 09/11/17 BY L BENFIELD Methicillin (oxacillin) susceptible Staphylococcus aureus (MSSA). Preferred therapy is anti staphylococcal beta lactam antibiotic (Cefazolin or Nafcillin), unless clinically contraindicated.    Methicillin resistance NOT DETECTED NOT DETECTED Final   Streptococcus species NOT DETECTED NOT DETECTED Final   Streptococcus agalactiae NOT DETECTED NOT DETECTED Final   Streptococcus pneumoniae NOT DETECTED NOT DETECTED Final   Streptococcus pyogenes NOT DETECTED NOT DETECTED Final   Acinetobacter baumannii NOT DETECTED NOT DETECTED Final   Enterobacteriaceae species NOT DETECTED NOT DETECTED Final   Enterobacter cloacae complex NOT DETECTED NOT DETECTED Final   Escherichia coli  NOT DETECTED NOT DETECTED Final   Klebsiella oxytoca NOT DETECTED NOT DETECTED Final   Klebsiella pneumoniae NOT DETECTED NOT DETECTED Final   Proteus species NOT DETECTED NOT DETECTED Final   Serratia marcescens NOT DETECTED NOT DETECTED Final   Carbapenem resistance NOT DETECTED NOT DETECTED Final   Haemophilus  influenzae NOT DETECTED NOT DETECTED Final   Neisseria meningitidis NOT DETECTED NOT DETECTED Final   Pseudomonas aeruginosa NOT DETECTED NOT DETECTED Final   Candida albicans NOT DETECTED NOT DETECTED Final   Candida glabrata NOT DETECTED NOT DETECTED Final   Candida krusei NOT DETECTED NOT DETECTED Final   Candida parapsilosis NOT DETECTED NOT DETECTED Final   Candida tropicalis NOT DETECTED NOT DETECTED Final    Comment: Performed at [facility], [place] [address], [place], [place] [address]  Blood culture (routine x 2)     Status: Abnormal   Collection Time: 09/10/17  9:50 PM  Result Value Ref Range Status   Specimen Description BLOOD LEFT ANTECUBITAL  Final   Special Requests IN PEDIATRIC BOTTLE Blood Culture adequate volume  Final   Culture  Setup Time   Final    GRAM POSITIVE COCCI IN PEDIATRIC BOTTLE CRITICAL VALUE NOTED.  VALUE IS CONSISTENT WITH PREVIOUSLY REPORTED AND CALLED VALUE.    Culture (A)  Final    STAPHYLOCOCCUS AUREUS SUSCEPTIBILITIES PERFORMED ON PREVIOUS CULTURE WITHIN THE LAST 5 DAYS. Performed at [facility], [place] [address], [place], [place] [address]    Report Status 09/13/2017 FINAL  Final  Culture, blood (routine x 2)     Status: None   Collection Time: 09/11/17  6:35 PM  Result Value Ref Range Status   Specimen Description BLOOD LEFT ANTECUBITAL  Final   Special Requests   Final    BOTTLES DRAWN AEROBIC AND ANAEROBIC Blood Culture adequate volume   Culture   Final    NO GROWTH 5 DAYS Performed at [facility], [place] [address] [place], [place] [address]    Report Status 09/16/2017 FINAL  Final  Culture, blood (routine x  2)     Status: Abnormal   Collection Time: 09/11/17  9:30 PM  Result Value Ref Range Status   Specimen Description BLOOD RIGHT HAND  Final   Special Requests IN PEDIATRIC BOTTLE Blood Culture adequate volume  Final   Culture  Setup Time   Final    GRAM POSITIVE COCCI IN CLUSTERS IN PEDIATRIC BOTTLE CRITICAL RESULT CALLED TO, READ BACK BY AND VERIFIED WITH: PHARMD B MARTIN [id number] MLM    Culture (A)  Final    STAPHYLOCOCCUS AUREUS SUSCEPTIBILITIES PERFORMED ON PREVIOUS CULTURE WITHIN THE LAST 5 DAYS. Performed at [facility], [place] [address] [place], [place] [address]    Report Status 09/14/2017 FINAL  Final  Blood Culture ID Panel (Reflexed)     Status: Abnormal   Collection Time: 09/11/17  9:30 PM  Result Value Ref Range Status   Enterococcus species NOT DETECTED NOT DETECTED Final   Vancomycin resistance NOT DETECTED NOT DETECTED Final   Listeria monocytogenes NOT DETECTED NOT DETECTED Final   Staphylococcus species DETECTED (A) NOT DETECTED Final    Comment: CRITICAL RESULT CALLED TO, READ BACK BY AND VERIFIED WITH: T DANG PHARMD 1833 09/12/17 A BROWNING    Staphylococcus aureus DETECTED (A) NOT DETECTED Final    Comment: Methicillin (oxacillin) susceptible Staphylococcus aureus (MSSA). Preferred therapy is anti staphylococcal beta lactam antibiotic (Cefazolin or Nafcillin), unless clinically contraindicated. CRITICAL RESULT CALLED TO, READ BACK BY AND VERIFIED WITH: T DANG PHARMD 1833 09/12/17 A BROWNING    Methicillin resistance NOT DETECTED NOT DETECTED Final   Streptococcus species NOT DETECTED NOT DETECTED Final   Streptococcus agalactiae NOT DETECTED NOT DETECTED Final   Streptococcus pneumoniae NOT DETECTED NOT DETECTED Final  Streptococcus pyogenes NOT DETECTED NOT DETECTED Final   Acinetobacter baumannii NOT DETECTED NOT DETECTED Final   Enterobacteriaceae species NOT DETECTED NOT DETECTED Final   Enterobacter cloacae complex NOT DETECTED NOT DETECTED Final    Escherichia coli NOT DETECTED NOT DETECTED Final   Klebsiella oxytoca NOT DETECTED NOT DETECTED Final   Klebsiella pneumoniae NOT DETECTED NOT DETECTED Final   Proteus species NOT DETECTED NOT DETECTED Final   Serratia marcescens NOT DETECTED NOT DETECTED Final   Carbapenem resistance NOT DETECTED NOT DETECTED Final   Haemophilus influenzae NOT DETECTED NOT DETECTED Final   Neisseria meningitidis NOT DETECTED NOT DETECTED Final   Pseudomonas aeruginosa NOT DETECTED NOT DETECTED Final   Candida albicans NOT DETECTED NOT DETECTED Final   Candida glabrata NOT DETECTED NOT DETECTED Final   Candida krusei NOT DETECTED NOT DETECTED Final   Candida parapsilosis NOT DETECTED NOT DETECTED Final   Candida tropicalis NOT DETECTED NOT DETECTED Final    Comment: Performed at [facility], [place] [address], [place], [place] [address]  Culture, blood (Routine X 2) w Reflex to ID Panel     Status: Abnormal   Collection Time: 09/13/17  3:50 PM  Result Value Ref Range Status   Specimen Description BLOOD RIGHT HAND  Final   Special Requests IN PEDIATRIC BOTTLE Blood Culture adequate volume  Final   Culture  Setup Time   Final    GRAM POSITIVE COCCI IN PEDIATRIC BOTTLE CRITICAL VALUE NOTED.  VALUE IS CONSISTENT WITH PREVIOUSLY REPORTED AND CALLED VALUE.    Culture (A)  Final    STAPHYLOCOCCUS AUREUS SUSCEPTIBILITIES PERFORMED ON PREVIOUS CULTURE WITHIN THE LAST 5 DAYS. Performed at [facility], [place] [address] [place], [place] [address]    Report Status 09/16/2017 FINAL  Final  Culture, blood (Routine X 2) w Reflex to ID Panel     Status: Abnormal   Collection Time: 09/13/17  4:00 PM  Result Value Ref Range Status   Specimen Description BLOOD RIGHT ARM  Final   Special Requests IN PEDIATRIC BOTTLE Blood Culture adequate volume  Final   Culture  Setup Time   Final    GRAM POSITIVE COCCI IN CLUSTERS IN PEDIATRIC BOTTLE CRITICAL RESULT CALLED TO, READ BACK BY AND VERIFIED WITH: Carlis Stable [id number] MLM Performed at [facility], [place] [address], [place], [place] [address]    Culture STAPHYLOCOCCUS AUREUS (A)  Final   Report Status 09/16/2017 FINAL  Final   Organism ID, Bacteria STAPHYLOCOCCUS AUREUS  Final      Susceptibility   Staphylococcus aureus - MIC*    CIPROFLOXACIN <=0.5 SENSITIVE Sensitive     ERYTHROMYCIN <=0.25 SENSITIVE Sensitive     GENTAMICIN <=0.5 SENSITIVE Sensitive     OXACILLIN <=0.25 SENSITIVE Sensitive     TETRACYCLINE <=1 SENSITIVE Sensitive     VANCOMYCIN <=0.5 SENSITIVE Sensitive     TRIMETH/SULFA <=10 SENSITIVE Sensitive     CLINDAMYCIN <=0.25 SENSITIVE Sensitive     RIFAMPIN <=0.5 SENSITIVE Sensitive     Inducible Clindamycin NEGATIVE Sensitive     * STAPHYLOCOCCUS AUREUS  Culture, blood (routine x 2)     Status: None (Preliminary result)   Collection Time: 09/16/17  9:44 AM  Result Value Ref Range Status   Specimen Description BLOOD RIGHT HAND  Final   Special Requests   Final    BOTTLES DRAWN AEROBIC AND ANAEROBIC Blood Culture results may not be optimal due to an inadequate volume of blood  received in culture bottles   Culture   Final    NO GROWTH 1 Mier Performed at [facility], [place] [address], [place], [place] [address]    Report Status PENDING  Incomplete  Culture, blood (routine x 2)     Status: None (Preliminary result)   Collection Time: 09/16/17  9:52 AM  Result Value Ref Range Status   Specimen Description BLOOD LEFT HAND  Final   Special Requests   Final    BOTTLES DRAWN AEROBIC AND ANAEROBIC Blood Culture adequate volume   Culture   Final    NO GROWTH 1 Gracy Performed at [facility], [place] [address], [place], [place] [address]    Report Status PENDING  Incomplete         Radiology Studies: No results found.      Scheduled Meds: . buprenorphine-naloxone  1 tablet Sublingual BID  . famotidine  20 mg Oral Daily  . nicotine  14 mg Transdermal Daily   Continuous Infusions: . sodium  chloride 100 mL/hr at 09/17/17 0832  .  ceFAZolin (ANCEF) IV Stopped (09/17/17 0659)     LOS: 6 days    Time spent: 35 minutes.     Elmarie Shiley, MD [facility] Pager [phone]  If 7PM-7AM, please contact night-coverage [email or web address] Password [email or web address] 09/17/2017, 2:00 PM

## 2017-09-18 ENCOUNTER — Inpatient Hospital Stay (HOSPITAL_COMMUNITY): Payer: Self-pay

## 2017-09-18 DIAGNOSIS — A419 Sepsis, unspecified organism: Principal | ICD-10-CM

## 2017-09-18 DIAGNOSIS — M25462 Effusion, left knee: Secondary | ICD-10-CM

## 2017-09-18 DIAGNOSIS — R200 Anesthesia of skin: Secondary | ICD-10-CM

## 2017-09-18 DIAGNOSIS — D65 Disseminated intravascular coagulation [defibrination syndrome]: Secondary | ICD-10-CM

## 2017-09-18 DIAGNOSIS — M79651 Pain in right thigh: Secondary | ICD-10-CM

## 2017-09-18 DIAGNOSIS — R531 Weakness: Secondary | ICD-10-CM

## 2017-09-18 LAB — VITAMIN B12: VITAMIN B 12: 725 pg/mL (ref 180–914)

## 2017-09-18 LAB — BASIC METABOLIC PANEL
ANION GAP: 10 (ref 5–15)
BUN: 9 mg/dL (ref 6–20)
CALCIUM: 7.6 mg/dL — AB (ref 8.9–10.3)
CHLORIDE: 101 mmol/L (ref 101–111)
CO2: 20 mmol/L — AB (ref 22–32)
Creatinine, Ser: 0.69 mg/dL (ref 0.61–1.24)
GFR calc non Af Amer: >60 mL/min (ref >60)
Glucose, Bld: 92 mg/dL (ref 65–99)
Potassium: 4 mmol/L (ref 3.5–5.1)
Sodium: 131 mmol/L — ABNORMAL LOW (ref 135–145)

## 2017-09-18 LAB — CBC
HEMATOCRIT: 25.5 % — AB (ref 39.0–52.0)
HEMOGLOBIN: 8.4 g/dL — AB (ref 13.0–17.0)
MCH: 30.8 pg (ref 26.0–34.0)
MCHC: 32.9 g/dL (ref 30.0–36.0)
MCV: 93.4 fL (ref 78.0–100.0)
Platelets: 229 10*3/uL (ref 150–400)
RBC: 2.73 MIL/uL — ABNORMAL LOW (ref 4.22–5.81)
RDW: 13 % (ref 11.5–15.5)
WBC: 9.3 10*3/uL (ref 4.0–10.5)

## 2017-09-18 LAB — IRON AND TIBC
Iron: 19 ug/dL — ABNORMAL LOW (ref 45–182)
Saturation Ratios: 10 % — ABNORMAL LOW (ref 17.9–39.5)
TIBC: 182 ug/dL — ABNORMAL LOW (ref 250–450)
UIBC: 163 ug/dL

## 2017-09-18 LAB — FOLATE: Folate: 10.6 ng/mL (ref >5.9)

## 2017-09-18 LAB — RETICULOCYTES
RBC.: 2.73 MIL/uL — ABNORMAL LOW (ref 4.22–5.81)
RETIC COUNT ABSOLUTE: 51.9 10*3/uL (ref 19.0–186.0)
Retic Ct Pct: 1.9 % (ref 0.4–3.1)

## 2017-09-18 LAB — FERRITIN: FERRITIN: 378 ng/mL — AB (ref 24–336)

## 2017-09-18 MED ORDER — KETOROLAC TROMETHAMINE 15 MG/ML IJ SOLN: 15.0000 mg | Freq: Four times a day (QID) | INTRAMUSCULAR | Status: AC | PRN

## 2017-09-18 MED ORDER — ENOXAPARIN SODIUM 40 MG/0.4ML ~~LOC~~ SOLN
40.0000 mg | SUBCUTANEOUS | Status: DC
  Administered 2017-09-19: 40 mg via SUBCUTANEOUS
  Filled 2017-09-18: qty 0.4

## 2017-09-18 MED ORDER — ENSURE ENLIVE PO LIQD
237.0000 mL | Freq: Two times a day (BID) | ORAL | Status: DC
  Administered 2017-09-19 – 2017-09-20 (×2): 237 mL via ORAL

## 2017-09-18 MED ORDER — GADOBENATE DIMEGLUMINE 529 MG/ML IV SOLN
14.0000 mL | Freq: Once | INTRAVENOUS | Status: AC | PRN
Start: 2017-09-18 — End: 2017-09-18
  Administered 2017-09-18: 14 mL via INTRAVENOUS

## 2017-09-18 NOTE — Progress Notes (Signed)
Subjective: C/o numbness  4th and 5th fingers with weakness and also pain in right thigh where he has induration   Antibiotics:  Anti-infectives (From admission, onward)   Start     Dose/Rate Route Frequency Ordered Stop   09/15/17 1500  Oritavancin Diphosphate (ORBACTIV) 1,200 mg in dextrose 5 % IVPB     1,200 mg 333.3 mL/hr over 180 Minutes Intravenous Once 09/15/17 1357 09/15/17 2100   09/12/17 1400  ceFAZolin (ANCEF) IVPB 2g/100 mL premix     2 g 200 mL/hr over 30 Minutes Intravenous Every 8 hours 09/12/17 1300     09/11/17 2100  vancomycin (VANCOCIN) IVPB 750 mg/150 ml premix  Status:  Discontinued     750 mg 150 mL/hr over 60 Minutes Intravenous Every 8 hours 09/11/17 2015 09/12/17 1300      Medications: Scheduled Meds: . buprenorphine-naloxone  1 tablet Sublingual BID  . famotidine  20 mg Oral Daily  . nicotine  14 mg Transdermal Daily   Continuous Infusions: . sodium chloride 100 mL/hr at 09/18/17 1227  .  ceFAZolin (ANCEF) IV Stopped (09/18/17 0722)   PRN Meds:.acetaminophen, alum & mag hydroxide-simeth, ketorolac, ondansetron **OR** ondansetron (ZOFRAN) IV, oxyCODONE, polyethylene glycol, sodium chloride    Objective: Weight change: -3.5 oz (-0.1 kg)  Intake/Output Summary (Last 24 hours) at 09/18/2017 1258 Last data filed at 09/18/2017 0600 Gross per 24 hour  Intake 3210 ml  Output 2550 ml  Net 660 ml   Blood pressure 122/76, pulse (!) 109, temperature 99.6 F (37.6 C), resp. rate 15, height 5\' 9"  (1.753 m), weight 160 lb 15 oz (73 kg), SpO2 98 %. Temp:  [99.1 F (37.3 C)-102.6 F (39.2 C)] 99.6 F (37.6 C) (02/17 0427) Pulse Rate:  [99-109] 109 (02/17 0427) Resp:  [15-18] 15 (02/17 0427) BP: (122)/(73-81) 122/76 (02/17 0427) SpO2:  [98 %] 98 % (02/17 0427) Weight:  [160 lb 15 oz (73 kg)] 160 lb 15 oz (73 kg) (02/16 2040)  Physical Exam: General: Alert and awake, oriented x3,  HEENT: anicteric sclera,  EOMI CVS regular rate, normal r,   no murmur rubs or gallops Chest: clear to auscultation bilaterally, no wheezing, rales or rhonchi Abdomen: soft nontender, nondistended, normal bowel sounds, Extremities: exquisite tenderness and induration in right thigh distally Skin: mx tattoos  Neuro: Numbness in his fourth and fifth finger I having a hard time appreciating the weakness when I challenged him with grip and extension and flexion  CBC:  CBC Latest Ref Rng & Units 09/18/2017 09/16/2017 09/14/2017  WBC 4.0 - 10.5 K/uL 9.3 12.4(H) 13.9(H)  Hemoglobin 13.0 - 17.0 g/dL 8.4(L) 8.7(L) 8.0(L)  Hematocrit 39.0 - 52.0 % 25.5(L) 25.6(L) 23.9(L)  Platelets 150 - 400 K/uL 229 147(L) 66(L)      BMET Recent Labs    09/16/17 0633 09/18/17 0508  NA 132* 131*  K 4.2 4.0  CL 103 101  CO2 20* 20*  GLUCOSE 94 92  BUN 9 9  CREATININE 0.84 0.69  CALCIUM 7.5* 7.6*     Liver Panel  No results for input(s): PROT, ALBUMIN, AST, ALT, ALKPHOS, BILITOT, BILIDIR, IBILI in the last 72 hours.     Sedimentation Rate No results for input(s): ESRSEDRATE in the last 72 hours. C-Reactive Protein No results for input(s): CRP in the last 72 hours.  Micro Results: Recent Results (from the past 720 hour(s))  Blood culture (routine x 2)     Status: Abnormal   Collection Time: 09/10/17  7:45 PM  Result Value Ref Range Status   Specimen Description BLOOD RIGHT HAND  Final   Special Requests IN PEDIATRIC BOTTLE Blood Culture adequate volume  Final   Culture  Setup Time   Final    GRAM POSITIVE COCCI IN CLUSTERS IN PEDIATRIC BOTTLE CRITICAL RESULT CALLED TO, READ BACK BY AND VERIFIED WITH: T RUDISILL,PHARMD AT 1443 09/11/17 BY L BENFIELD Performed at [facility], [place] [address] [place], [place] [address]    Culture STAPHYLOCOCCUS AUREUS (A)  Final   Report Status 09/13/2017 FINAL  Final   Organism ID, Bacteria STAPHYLOCOCCUS AUREUS  Final      Susceptibility   Staphylococcus aureus - MIC*    CIPROFLOXACIN <=0.5 SENSITIVE  Sensitive     ERYTHROMYCIN <=0.25 SENSITIVE Sensitive     GENTAMICIN <=0.5 SENSITIVE Sensitive     OXACILLIN 0.5 SENSITIVE Sensitive     TETRACYCLINE <=1 SENSITIVE Sensitive     VANCOMYCIN 1 SENSITIVE Sensitive     TRIMETH/SULFA <=10 SENSITIVE Sensitive     CLINDAMYCIN <=0.25 SENSITIVE Sensitive     RIFAMPIN <=0.5 SENSITIVE Sensitive     Inducible Clindamycin NEGATIVE Sensitive     * STAPHYLOCOCCUS AUREUS  Blood Culture ID Panel (Reflexed)     Status: Abnormal   Collection Time: 09/10/17  7:45 PM  Result Value Ref Range Status   Enterococcus species NOT DETECTED NOT DETECTED Final   Vancomycin resistance NOT DETECTED NOT DETECTED Final   Listeria monocytogenes NOT DETECTED NOT DETECTED Final   Staphylococcus species DETECTED (A) NOT DETECTED Final    Comment: CRITICAL RESULT CALLED TO, READ BACK BY AND VERIFIED WITH: T RUDISILL,PHARMD AT 8676 09/11/17 BY L BENFIELD    Staphylococcus aureus DETECTED (A) NOT DETECTED Final    Comment: CRITICAL RESULT CALLED TO, READ BACK BY AND VERIFIED WITH: T RUDISILL,PHARMD AT 1950 09/11/17 BY L BENFIELD Methicillin (oxacillin) susceptible Staphylococcus aureus (MSSA). Preferred therapy is anti staphylococcal beta lactam antibiotic (Cefazolin or Nafcillin), unless clinically contraindicated.    Methicillin resistance NOT DETECTED NOT DETECTED Final   Streptococcus species NOT DETECTED NOT DETECTED Final   Streptococcus agalactiae NOT DETECTED NOT DETECTED Final   Streptococcus pneumoniae NOT DETECTED NOT DETECTED Final   Streptococcus pyogenes NOT DETECTED NOT DETECTED Final   Acinetobacter baumannii NOT DETECTED NOT DETECTED Final   Enterobacteriaceae species NOT DETECTED NOT DETECTED Final   Enterobacter cloacae complex NOT DETECTED NOT DETECTED Final   Escherichia coli NOT DETECTED NOT DETECTED Final   Klebsiella oxytoca NOT DETECTED NOT DETECTED Final   Klebsiella pneumoniae NOT DETECTED NOT DETECTED Final   Proteus species NOT DETECTED NOT  DETECTED Final   Serratia marcescens NOT DETECTED NOT DETECTED Final   Carbapenem resistance NOT DETECTED NOT DETECTED Final   Haemophilus influenzae NOT DETECTED NOT DETECTED Final   Neisseria meningitidis NOT DETECTED NOT DETECTED Final   Pseudomonas aeruginosa NOT DETECTED NOT DETECTED Final   Candida albicans NOT DETECTED NOT DETECTED Final   Candida glabrata NOT DETECTED NOT DETECTED Final   Candida krusei NOT DETECTED NOT DETECTED Final   Candida parapsilosis NOT DETECTED NOT DETECTED Final   Candida tropicalis NOT DETECTED NOT DETECTED Final    Comment: Performed at [facility], [place] [address], [place], [place] [address]  Blood culture (routine x 2)     Status: Abnormal   Collection Time: 09/10/17  9:50 PM  Result Value Ref Range Status   Specimen Description BLOOD LEFT ANTECUBITAL  Final   Special Requests IN  PEDIATRIC BOTTLE Blood Culture adequate volume  Final   Culture  Setup Time   Final    GRAM POSITIVE COCCI IN PEDIATRIC BOTTLE CRITICAL VALUE NOTED.  VALUE IS CONSISTENT WITH PREVIOUSLY REPORTED AND CALLED VALUE.    Culture (A)  Final    STAPHYLOCOCCUS AUREUS SUSCEPTIBILITIES PERFORMED ON PREVIOUS CULTURE WITHIN THE LAST 5 DAYS. Performed at [facility], [place] [address], [place], [place] [address]    Report Status 09/13/2017 FINAL  Final  Culture, blood (routine x 2)     Status: None   Collection Time: 09/11/17  6:35 PM  Result Value Ref Range Status   Specimen Description BLOOD LEFT ANTECUBITAL  Final   Special Requests   Final    BOTTLES DRAWN AEROBIC AND ANAEROBIC Blood Culture adequate volume   Culture   Final    NO GROWTH 5 DAYS Performed at [facility], [place] [address] [place], [place] [address]    Report Status 09/16/2017 FINAL  Final  Culture, blood (routine x 2)     Status: Abnormal   Collection Time: 09/11/17  9:30 PM  Result Value Ref Range Status   Specimen Description BLOOD RIGHT HAND  Final   Special Requests IN PEDIATRIC  BOTTLE Blood Culture adequate volume  Final   Culture  Setup Time   Final    GRAM POSITIVE COCCI IN CLUSTERS IN PEDIATRIC BOTTLE CRITICAL RESULT CALLED TO, READ BACK BY AND VERIFIED WITH: PHARMD B MARTIN [id number] MLM    Culture (A)  Final    STAPHYLOCOCCUS AUREUS SUSCEPTIBILITIES PERFORMED ON PREVIOUS CULTURE WITHIN THE LAST 5 DAYS. Performed at [facility], [place] [address], [place], [place] [address]    Report Status 09/14/2017 FINAL  Final  Blood Culture ID Panel (Reflexed)     Status: Abnormal   Collection Time: 09/11/17  9:30 PM  Result Value Ref Range Status   Enterococcus species NOT DETECTED NOT DETECTED Final   Vancomycin resistance NOT DETECTED NOT DETECTED Final   Listeria monocytogenes NOT DETECTED NOT DETECTED Final   Staphylococcus species DETECTED (A) NOT DETECTED Final    Comment: CRITICAL RESULT CALLED TO, READ BACK BY AND VERIFIED WITH: T DANG PHARMD 1833 09/12/17 A BROWNING    Staphylococcus aureus DETECTED (A) NOT DETECTED Final    Comment: Methicillin (oxacillin) susceptible Staphylococcus aureus (MSSA). Preferred therapy is anti staphylococcal beta lactam antibiotic (Cefazolin or Nafcillin), unless clinically contraindicated. CRITICAL RESULT CALLED TO, READ BACK BY AND VERIFIED WITH: T DANG PHARMD 0347 09/12/17 A BROWNING    Methicillin resistance NOT DETECTED NOT DETECTED Final   Streptococcus species NOT DETECTED NOT DETECTED Final   Streptococcus agalactiae NOT DETECTED NOT DETECTED Final   Streptococcus pneumoniae NOT DETECTED NOT DETECTED Final   Streptococcus pyogenes NOT DETECTED NOT DETECTED Final   Acinetobacter baumannii NOT DETECTED NOT DETECTED Final   Enterobacteriaceae species NOT DETECTED NOT DETECTED Final   Enterobacter cloacae complex NOT DETECTED NOT DETECTED Final   Escherichia coli NOT DETECTED NOT DETECTED Final   Klebsiella oxytoca NOT DETECTED NOT DETECTED Final   Klebsiella pneumoniae NOT DETECTED NOT DETECTED Final   Proteus  species NOT DETECTED NOT DETECTED Final   Serratia marcescens NOT DETECTED NOT DETECTED Final   Carbapenem resistance NOT DETECTED NOT DETECTED Final   Haemophilus influenzae NOT DETECTED NOT DETECTED Final   Neisseria meningitidis NOT DETECTED NOT DETECTED Final   Pseudomonas aeruginosa NOT DETECTED NOT DETECTED Final   Candida albicans NOT DETECTED NOT DETECTED Final  Candida glabrata NOT DETECTED NOT DETECTED Final   Candida krusei NOT DETECTED NOT DETECTED Final   Candida parapsilosis NOT DETECTED NOT DETECTED Final   Candida tropicalis NOT DETECTED NOT DETECTED Final    Comment: Performed at [facility], [place] [address], [place], [place] [address]  Culture, blood (Routine X 2) w Reflex to ID Panel     Status: Abnormal   Collection Time: 09/13/17  3:50 PM  Result Value Ref Range Status   Specimen Description BLOOD RIGHT HAND  Final   Special Requests IN PEDIATRIC BOTTLE Blood Culture adequate volume  Final   Culture  Setup Time   Final    GRAM POSITIVE COCCI IN PEDIATRIC BOTTLE CRITICAL VALUE NOTED.  VALUE IS CONSISTENT WITH PREVIOUSLY REPORTED AND CALLED VALUE.    Culture (A)  Final    STAPHYLOCOCCUS AUREUS SUSCEPTIBILITIES PERFORMED ON PREVIOUS CULTURE WITHIN THE LAST 5 DAYS. Performed at [facility], [place] [address], [place], [place] [address]    Report Status 09/16/2017 FINAL  Final  Culture, blood (Routine X 2) w Reflex to ID Panel     Status: Abnormal   Collection Time: 09/13/17  4:00 PM  Result Value Ref Range Status   Specimen Description BLOOD RIGHT ARM  Final   Special Requests IN PEDIATRIC BOTTLE Blood Culture adequate volume  Final   Culture  Setup Time   Final    GRAM POSITIVE COCCI IN CLUSTERS IN PEDIATRIC BOTTLE CRITICAL RESULT CALLED TO, READ BACK BY AND VERIFIED WITH: Carlis Stable [id number] MLM Performed at [facility], [place] [address] [place], [place] [address]    Culture STAPHYLOCOCCUS AUREUS (A)  Final   Report Status 09/16/2017  FINAL  Final   Organism ID, Bacteria STAPHYLOCOCCUS AUREUS  Final      Susceptibility   Staphylococcus aureus - MIC*    CIPROFLOXACIN <=0.5 SENSITIVE Sensitive     ERYTHROMYCIN <=0.25 SENSITIVE Sensitive     GENTAMICIN <=0.5 SENSITIVE Sensitive     OXACILLIN <=0.25 SENSITIVE Sensitive     TETRACYCLINE <=1 SENSITIVE Sensitive     VANCOMYCIN <=0.5 SENSITIVE Sensitive     TRIMETH/SULFA <=10 SENSITIVE Sensitive     CLINDAMYCIN <=0.25 SENSITIVE Sensitive     RIFAMPIN <=0.5 SENSITIVE Sensitive     Inducible Clindamycin NEGATIVE Sensitive     * STAPHYLOCOCCUS AUREUS  Culture, blood (routine x 2)     Status: None (Preliminary result)   Collection Time: 09/16/17  9:44 AM  Result Value Ref Range Status   Specimen Description BLOOD RIGHT HAND  Final   Special Requests   Final    BOTTLES DRAWN AEROBIC AND ANAEROBIC Blood Culture results may not be optimal due to an inadequate volume of blood received in culture bottles   Culture   Final    NO GROWTH 1 Yoshida Performed at [facility], [place] [address], [place], [place] [address]    Report Status PENDING  Incomplete  Culture, blood (routine x 2)     Status: None (Preliminary result)   Collection Time: 09/16/17  9:52 AM  Result Value Ref Range Status   Specimen Description BLOOD LEFT HAND  Final   Special Requests   Final    BOTTLES DRAWN AEROBIC AND ANAEROBIC Blood Culture adequate volume   Culture   Final    NO GROWTH 1 Eckhart Performed at [facility], [place] [address] [place], [place] [address]    Report Status PENDING  Incomplete  Studies/Results: Mr Jeri Cos Wo Contrast  Result Date: 09/18/2017 CLINICAL DATA:  Endocarditis with muscle weakness and right hand numbness. EXAM: MRI HEAD WITHOUT AND WITH CONTRAST TECHNIQUE: Multiplanar, multiecho pulse sequences of the brain and surrounding structures were obtained without and with intravenous contrast. CONTRAST:  71mL MULTIHANCE GADOBENATE DIMEGLUMINE 529 MG/ML IV SOLN COMPARISON:   Head CT 09/12/2017 FINDINGS: Brain: The midline structures are normal. There is no acute infarct or acute hemorrhage. No mass lesion, hydrocephalus, dural abnormality or extra-axial collection. Focus of hyperintense T2-weighted signal within the left frontal periventricular white matter. Otherwise normal parenchymal signal. There is atrophy greater than expected for age with ex vacuo dilatation of the lateral ventricles. 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 No chronic microhemorrhage or superficial siderosis. Vascular: Major intracranial arterial and venous sinus flow voids are preserved. Skull and upper cervical spine: The visualized skull base, calvarium, upper cervical spine and extracranial soft tissues are normal. Sinuses/Orbits: No fluid levels or advanced mucosal thickening. No mastoid or middle ear effusion. Normal orbits. IMPRESSION: Age advanced volume loss with ex vacuo dilatation of the ventricles, but no acute abnormality. Electronically Signed   By: Ulyses Jarred M.D.   On: 09/18/2017 02:30      Assessment/Plan:  INTERVAL HISTORY: No numbness in his hand and weakness also worsening thigh pain   Principal Problem:   Acute bacterial endocarditis Active Problems:   Heroin abuse (HCC)   Polysubstance abuse (HCC)   Severe sepsis (HCC)   Bacteremia due to Staphylococcus aureus   Cigarette smoker   Hypokalemia   DIC (disseminated intravascular coagulation) (HCC)   Staphylococcal arthritis of right shoulder (HCC)   Septic infrapatellar bursitis of left knee   Staphylococcal arthritis of left hip (HCC)   Staphylococcal arthritis of right hip (HCC)   Calf abscess   Pain   Pyomyositis    Gary Hicks is a 32 y.o. male with with IV drug use, admitted with methicillin sensitive Staphylococcus aureus bacteremia with sepsis and DIC, with his knee effusion with concern for septic joint but the knee was not able to be aspirated successfully, he will does have right-sided  endocarditis and has been complaining of pain in multiple locations recently developed left-sided numbness in his fingers along with hand weakness and now worsening pain in his thigh where he has induration.  #1[facility] Antimicrobial Management Team Staphylococcus aureus bacteremia   Staphylococcus aureus bacteremia (SAB) is associated with a high rate of complications and mortality.  Specific aspects of clinical management are critical to optimizing the outcome of patients with SAB.  Therefore, the [facility] Antimicrobial Management Team [facility]) has initiated an intervention aimed at improving the management of SAB at [facility].  To do so, Infectious Diseases physicians are providing an evidence-based consult for the management of all patients with SAB.     Yes No Comments  Perform follow-up blood cultures (even if the patient is afebrile) to ensure clearance of bacteremia [x]  []   repeat blood cultures are finally not growing an organism  Remove vascular catheter and obtain follow-up blood cultures after the removal of the catheter []  []   do not place PICC line or central line until disposed is clear and we have documented clearance of bacteremia  Perform echocardiography to evaluate for endocarditis (transthoracic ECHO is 40-50% sensitive, TEE is > 90% sensitive) [x]  []  Please keep in mind, that neither test can definitively EXCLUDE endocarditis, and that should clinical suspicion remain high for  endocarditis the patient should then still be treated with an "endocarditis" duration of therapy = 6 weeks  He has known right-sided endocarditis on transthoracic echocardiogramlarge, 1.1 cm (W) x 1.8 cm (L),   multilobulated, highly mobile vegetation on the right ventricular   aspect of the anterior leafle or tricuspid valve w moderate-severe   regurgitation.  Consult electrophysiologist to evaluate implanted cardiac device (pacemaker, ICD) []  []    Ensure source control [x]  []  Have all  abscesses been drained effectively? Have deep seeded infections (septic joints or osteomyelitis) had appropriate surgical debridement?  Investigate for "metastatic" sites of infection [x]  []  Does the patient have ANY symptom or physical exam finding that would suggest a deeper infection (back or neck pain that may be suggestive of vertebral osteomyelitis or epidural abscess, muscle pain that could be a symptom of pyomyositis)?  Keep in mind that for deep seeded infections MRI imaging with contrast is preferred rather than other often insensitive tests such as plain x-rays, especially early in a patient's presentation.  Will get MRI C spine w GAD   And when able MRI of right thigh with contrast  Change antibiotic therapy to cefazolin []  []  Beta-lactam antibiotics are preferred for MSSA due to higher cure rates.   If on Vancomycin, goal trough should be 15 - 20 mcg/mL  Estimated duration of IV antibiotic therapy: 6 weeks []  []  Consult case management for probably prolonged outpatient IV antibiotic therapy   #2 IV drug use he has been seen by Dr. Evette Doffing who has placed him on Suboxone.   I spent greater than 35  minutes with the patient including greater than 50% of time in face to face counsel of the patient guarding his new findings and workup that would be needed and in coordination of his care with Dr. Tyrell Antonio.   Dr. Baxter Flattery will take over the service tomorrow.   LOS: 7 days   Alcide Evener 09/18/2017, 12:58 PM

## 2017-09-18 NOTE — Progress Notes (Signed)
PROGRESS NOTE    Gary Hicks  EQA:[id number] DOB: 1986/03/09 DOA: 09/11/2017 PCP: Patient, [name] Per   Brief Narrative: This is a 32 year old man with active IV drug abuse, who presents with generalized pain. History is limited secondary to his acute illness including pain and fluctuations in alertness,his father is present at the bedside assists with history. The patient reports that the painhasbeen happening since the end of January, father reports that 2-3 days ago he was concerned the patient may have had a seizure because he was found unresponsive with some abnormal eye movements in the fetal position. The patient was incarcerated and released 3-4 weeks ago. Patient admits using IV methamphetamine as well as heroin in the past 24 hours prior to admission. Reports global polyarthralgias including his bilateral feet bilateral knees bilateral hips and shoulders. Also reports fevers and chills generalized myalgias. Acutely worsened today for what she calls father who brought him to seek medical attention.   Assessment & Plan:   Principal Problem:   Acute bacterial endocarditis Active Problems:   Heroin abuse ([place])   Polysubstance abuse ([place])   Severe sepsis ([place])   Bacteremia due to Staphylococcus aureus   Cigarette smoker   Hypokalemia   DIC (disseminated intravascular coagulation) (HCC)   Staphylococcal arthritis of right shoulder (HCC)   Septic infrapatellar bursitis of left knee   Staphylococcal arthritis of left hip (HCC)   Staphylococcal arthritis of right hip (HCC)   Calf abscess   Pain   Pyomyositis   1-Tricuspid Valve endocarditis, MSSA bacteremia, endocarditis.  Continue with ancef.  Spike fever last night Blood culture 2-11 Staphylococcus MSSA Repeated blood culture 2-12 Staph  Repeated Blood culture 2-15 no growth in 48 hours.   2-Multiples joints pain;  -MRI left knee ordered.  -MRI hip;  Mild soft tissue anasarca about the pelvis and both hips.  No drainable fluid collection or abscess. Nonspecific mild intramuscular edema about both hips question myositis. Small bilateral hip joint effusions without frank bone destruction or marrow signal abnormalities to suggest a septic joint. He was evaluated by ortho at that time.  On IV dilaudid.  MRI Right shoulder; Mild supraspinatus and infraspinatus muscle edema suggestive of Myositis.  Mild subacromial/subdeltoid bursitis. Pain management.  Complaints today of worsening right thigh pain, induration. Discussed with Dr Lucianne Lei dam, will get MRI of thigh. I will also get [place].  For left hand numbness, will get MRI cervical spine. MRI brain negative for abscess.   3-Sepsis secondary to number one. On IV fluids, IV antibiotics.   4-Heroin use;  IM teaching service consulted , to consider suboxone.  I really appreciate Dr Damita Dunnings help with Suboxone.  On Suboxone BID>   5-Convulsions Likely related to drug use Seizure precautions PRN Ativan    6-Anemia; anemia panel ordered.   Thrombocytopenia.  Resolved.   Hyponatremia; IV fluids.   Hepatitis c antibody positive;    DVT prophylaxis: start Lovenox, platelet  normalized.  Code Status: full code.  Family Communication: mother and father at bedside.  Disposition Plan: to be determine    Consultants:   ID  Ortho    Procedures:  E[facility];  - Left ventricle: The cavity size was normal. Systolic function was   normal. The estimated ejection fraction was in the range of 60%   to 65%. Wall motion was normal; there were no regional wall   motion abnormalities. - Left atrium: The atrium was mildly dilated. - Right ventricle: The cavity size was moderately dilated. Wall  thickness was normal. - Right atrium: The atrium was moderately dilated. - Tricuspid valve: There was a large, 1.1 cm (W) x 1.8 cm (L),   multilobulated, highly mobile vegetation on the right ventricular   aspect of the anterior leaflet. There was moderate-severe    regurgitation. - Pulmonary arteries: Systolic pressure was mildly increased. PA   peak pressure: 44 mm Hg (S). - Pericardium, extracardiac: A trivial pericardial effusion was    identified posterior to the heart.  Antimicrobials: Ancef 2-11  Subjective: He is complaining of right thigh pain and induration. Complaints of worsening right hip and leg pain.  He is asking to be back on IV dilaudid. Would like to avoid IV dilaudid with subaxon.   Objective: Vitals:   09/17/17 1711 09/17/17 1821 09/17/17 2040 09/18/17 0427  BP: 122/73  122/81 122/76  Pulse: 99  (!) 107 (!) 109  Resp: 18  16 15   Temp: 99.8 F (37.7 C) (!) 102.6 F (39.2 C) 99.1 F (37.3 C) 99.6 F (37.6 C)  TempSrc: Oral     SpO2: 98%  98% 98%  Weight:   73 kg (160 lb 15 oz)   Height:        Intake/Output Summary (Last 24 hours) at 09/18/2017 1252 Last data filed at 09/18/2017 0600 Gross per 24 hour  Intake 3210 ml  Output 2550 ml  Net 660 ml   Filed Weights   09/15/17 2043 09/16/17 2115 09/17/17 2040  Weight: 73.5 kg (162 lb 0.6 oz) 73.1 kg (161 lb 2.5 oz) 73 kg (160 lb 15 oz)    Examination:  General exam: NAD Respiratory system:  Normal respiratory effort, CTA Cardiovascular system: S 1, S 2 RRR Gastrointestinal system: BS present, soft, nt Central nervous system: Non focal.  Extremities: Left knee mild edema.  Skin: petechia rash    Data Reviewed: I have personally reviewed following labs and imaging studies  CBC: Recent Labs  Lab 09/11/17 1826 09/12/17 0629 09/13/17 0914 09/14/17 0806 09/16/17 0633 09/18/17 0508  WBC 16.1* 9.3 8.1 13.9* 12.4* 9.3  NEUTROABS 12.7*  --  5.8 11.7*  --   --   HGB 9.1* 9.7* 8.7* 8.0* 8.7* 8.4*  HCT 26.9* 28.7* 25.3* 23.9* 25.6* 25.5*  MCV 92.1 93.5 93.7 93.4 94.1 93.4  PLT 73* 52* 52* 66* 147* 527   Basic Metabolic Panel: Recent Labs  Lab 09/11/17 2100 09/12/17 0629 09/13/17 0914 09/14/17 0806 09/16/17 0633 09/18/17 0508  NA  --  135 132* 130*  132* 131*  K  --  3.5 3.3* 4.2 4.2 4.0  CL  --  102 102 104 103 101  CO2  --  22 19* 19* 20* 20*  GLUCOSE  --  112* 143* 105* 94 92  BUN  --  11 9 9 9 9   CREATININE  --  0.84 0.99 0.81 0.84 0.69  CALCIUM  --  7.5* 7.1* 7.0* 7.5* 7.6*  MG 1.7  --   --   --   --   --    GFR: Estimated Creatinine Clearance: 133.8 mL/min (by C-G formula based on SCr of 0.69 mg/dL). Liver Function Tests: Recent Labs  Lab 09/11/17 1826 09/14/17 0806  AST 36 97*  ALT 28 46  ALKPHOS 76 91  BILITOT 1.0 0.7  PROT 5.7* 5.2*  ALBUMIN 1.9* 1.4*   No results for input(s): LIPASE, AMYLASE in the last 168 hours. No results for input(s): AMMONIA in the last 168 hours. Coagulation Profile: Recent Labs  Lab  09/11/17 1826  INR 1.30   Cardiac Enzymes: No results for input(s): CKTOTAL, CKMB, CKMBINDEX, TROPONINI in the last 168 hours. BNP (last 3 results) No results for input(s): PROBNP in the last 8760 hours. HbA1C: No results for input(s): HGBA1C in the last 72 hours. CBG: No results for input(s): GLUCAP in the last 168 hours. Lipid Profile: No results for input(s): CHOL, HDL, LDLCALC, TRIG, CHOLHDL, LDLDIRECT in the last 72 hours. Thyroid Function Tests: No results for input(s): TSH, T4TOTAL, FREET4, T3FREE, THYROIDAB in the last 72 hours. Anemia Panel: Recent Labs    09/18/17 0508  RETICCTPCT 1.9   Sepsis Labs: Recent Labs  Lab 09/11/17 1850 09/11/17 2127 09/12/17 0629  LATICACIDVEN 2.72* 2.8* 1.4    Recent Results (from the past 240 hour(s))  Blood culture (routine x 2)     Status: Abnormal   Collection Time: 09/10/17  7:45 PM  Result Value Ref Range Status   Specimen Description BLOOD RIGHT HAND  Final   Special Requests IN PEDIATRIC BOTTLE Blood Culture adequate volume  Final   Culture  Setup Time   Final    GRAM POSITIVE COCCI IN CLUSTERS IN PEDIATRIC BOTTLE CRITICAL RESULT CALLED TO, READ BACK BY AND VERIFIED WITH: T RUDISILL,PHARMD AT 7902 09/11/17 BY L BENFIELD Performed at  [facility], [place] [address], [place], [place] [address]    Culture STAPHYLOCOCCUS AUREUS (A)  Final   Report Status 09/13/2017 FINAL  Final   Organism ID, Bacteria STAPHYLOCOCCUS AUREUS  Final      Susceptibility   Staphylococcus aureus - MIC*    CIPROFLOXACIN <=0.5 SENSITIVE Sensitive     ERYTHROMYCIN <=0.25 SENSITIVE Sensitive     GENTAMICIN <=0.5 SENSITIVE Sensitive     OXACILLIN 0.5 SENSITIVE Sensitive     TETRACYCLINE <=1 SENSITIVE Sensitive     VANCOMYCIN 1 SENSITIVE Sensitive     TRIMETH/SULFA <=10 SENSITIVE Sensitive     CLINDAMYCIN <=0.25 SENSITIVE Sensitive     RIFAMPIN <=0.5 SENSITIVE Sensitive     Inducible Clindamycin NEGATIVE Sensitive     * STAPHYLOCOCCUS AUREUS  Blood Culture ID Panel (Reflexed)     Status: Abnormal   Collection Time: 09/10/17  7:45 PM  Result Value Ref Range Status   Enterococcus species NOT DETECTED NOT DETECTED Final   Vancomycin resistance NOT DETECTED NOT DETECTED Final   Listeria monocytogenes NOT DETECTED NOT DETECTED Final   Staphylococcus species DETECTED (A) NOT DETECTED Final    Comment: CRITICAL RESULT CALLED TO, READ BACK BY AND VERIFIED WITH: T RUDISILL,PHARMD AT 5329 09/11/17 BY L BENFIELD    Staphylococcus aureus DETECTED (A) NOT DETECTED Final    Comment: CRITICAL RESULT CALLED TO, READ BACK BY AND VERIFIED WITH: T RUDISILL,PHARMD AT 9242 09/11/17 BY L BENFIELD Methicillin (oxacillin) susceptible Staphylococcus aureus (MSSA). Preferred therapy is anti staphylococcal beta lactam antibiotic (Cefazolin or Nafcillin), unless clinically contraindicated.    Methicillin resistance NOT DETECTED NOT DETECTED Final   Streptococcus species NOT DETECTED NOT DETECTED Final   Streptococcus agalactiae NOT DETECTED NOT DETECTED Final   Streptococcus pneumoniae NOT DETECTED NOT DETECTED Final   Streptococcus pyogenes NOT DETECTED NOT DETECTED Final   Acinetobacter baumannii NOT DETECTED NOT DETECTED Final   Enterobacteriaceae species NOT  DETECTED NOT DETECTED Final   Enterobacter cloacae complex NOT DETECTED NOT DETECTED Final   Escherichia coli NOT DETECTED NOT DETECTED Final   Klebsiella oxytoca NOT DETECTED NOT DETECTED Final   Klebsiella pneumoniae NOT DETECTED NOT DETECTED Final   Proteus species NOT  DETECTED NOT DETECTED Final   Serratia marcescens NOT DETECTED NOT DETECTED Final   Carbapenem resistance NOT DETECTED NOT DETECTED Final   Haemophilus influenzae NOT DETECTED NOT DETECTED Final   Neisseria meningitidis NOT DETECTED NOT DETECTED Final   Pseudomonas aeruginosa NOT DETECTED NOT DETECTED Final   Candida albicans NOT DETECTED NOT DETECTED Final   Candida glabrata NOT DETECTED NOT DETECTED Final   Candida krusei NOT DETECTED NOT DETECTED Final   Candida parapsilosis NOT DETECTED NOT DETECTED Final   Candida tropicalis NOT DETECTED NOT DETECTED Final    Comment: Performed at [facility], [place] [address], [place], [place] [address]  Blood culture (routine x 2)     Status: Abnormal   Collection Time: 09/10/17  9:50 PM  Result Value Ref Range Status   Specimen Description BLOOD LEFT ANTECUBITAL  Final   Special Requests IN PEDIATRIC BOTTLE Blood Culture adequate volume  Final   Culture  Setup Time   Final    GRAM POSITIVE COCCI IN PEDIATRIC BOTTLE CRITICAL VALUE NOTED.  VALUE IS CONSISTENT WITH PREVIOUSLY REPORTED AND CALLED VALUE.    Culture (A)  Final    STAPHYLOCOCCUS AUREUS SUSCEPTIBILITIES PERFORMED ON PREVIOUS CULTURE WITHIN THE LAST 5 DAYS. Performed at [facility], [place] [address], [place], [place] [address]    Report Status 09/13/2017 FINAL  Final  Culture, blood (routine x 2)     Status: None   Collection Time: 09/11/17  6:35 PM  Result Value Ref Range Status   Specimen Description BLOOD LEFT ANTECUBITAL  Final   Special Requests   Final    BOTTLES DRAWN AEROBIC AND ANAEROBIC Blood Culture adequate volume   Culture   Final    NO GROWTH 5 DAYS Performed at [place], [place] [address], [place], [place] [address]    Report Status 09/16/2017 FINAL  Final  Culture, blood (routine x 2)     Status: Abnormal   Collection Time: 09/11/17  9:30 PM  Result Value Ref Range Status   Specimen Description BLOOD RIGHT HAND  Final   Special Requests IN PEDIATRIC BOTTLE Blood Culture adequate volume  Final   Culture  Setup Time   Final    GRAM POSITIVE COCCI IN CLUSTERS IN PEDIATRIC BOTTLE CRITICAL RESULT CALLED TO, READ BACK BY AND VERIFIED WITH: PHARMD B MARTIN [id number] MLM    Culture (A)  Final    STAPHYLOCOCCUS AUREUS SUSCEPTIBILITIES PERFORMED ON PREVIOUS CULTURE WITHIN THE LAST 5 DAYS. Performed at [facility], [place] [address] [place], [place] [address]    Report Status 09/14/2017 FINAL  Final  Blood Culture ID Panel (Reflexed)     Status: Abnormal   Collection Time: 09/11/17  9:30 PM  Result Value Ref Range Status   Enterococcus species NOT DETECTED NOT DETECTED Final   Vancomycin resistance NOT DETECTED NOT DETECTED Final   Listeria monocytogenes NOT DETECTED NOT DETECTED Final   Staphylococcus species DETECTED (A) NOT DETECTED Final    Comment: CRITICAL RESULT CALLED TO, READ BACK BY AND VERIFIED WITH: T DANG PHARMD 1833 09/12/17 A BROWNING    Staphylococcus aureus DETECTED (A) NOT DETECTED Final    Comment: Methicillin (oxacillin) susceptible Staphylococcus aureus (MSSA). Preferred therapy is anti staphylococcal beta lactam antibiotic (Cefazolin or Nafcillin), unless clinically contraindicated. CRITICAL RESULT CALLED TO, READ BACK BY AND VERIFIED WITH: T DANG PHARMD 0737 09/12/17 A BROWNING    Methicillin resistance NOT DETECTED NOT DETECTED Final   Streptococcus species  NOT DETECTED NOT DETECTED Final   Streptococcus agalactiae NOT DETECTED NOT DETECTED Final   Streptococcus pneumoniae NOT DETECTED NOT DETECTED Final   Streptococcus pyogenes NOT DETECTED NOT DETECTED Final   Acinetobacter baumannii NOT DETECTED NOT DETECTED Final    Enterobacteriaceae species NOT DETECTED NOT DETECTED Final   Enterobacter cloacae complex NOT DETECTED NOT DETECTED Final   Escherichia coli NOT DETECTED NOT DETECTED Final   Klebsiella oxytoca NOT DETECTED NOT DETECTED Final   Klebsiella pneumoniae NOT DETECTED NOT DETECTED Final   Proteus species NOT DETECTED NOT DETECTED Final   Serratia marcescens NOT DETECTED NOT DETECTED Final   Carbapenem resistance NOT DETECTED NOT DETECTED Final   Haemophilus influenzae NOT DETECTED NOT DETECTED Final   Neisseria meningitidis NOT DETECTED NOT DETECTED Final   Pseudomonas aeruginosa NOT DETECTED NOT DETECTED Final   Candida albicans NOT DETECTED NOT DETECTED Final   Candida glabrata NOT DETECTED NOT DETECTED Final   Candida krusei NOT DETECTED NOT DETECTED Final   Candida parapsilosis NOT DETECTED NOT DETECTED Final   Candida tropicalis NOT DETECTED NOT DETECTED Final    Comment: Performed at [facility], [place] [address], [place], [place] [address]  Culture, blood (Routine X 2) w Reflex to ID Panel     Status: Abnormal   Collection Time: 09/13/17  3:50 PM  Result Value Ref Range Status   Specimen Description BLOOD RIGHT HAND  Final   Special Requests IN PEDIATRIC BOTTLE Blood Culture adequate volume  Final   Culture  Setup Time   Final    GRAM POSITIVE COCCI IN PEDIATRIC BOTTLE CRITICAL VALUE NOTED.  VALUE IS CONSISTENT WITH PREVIOUSLY REPORTED AND CALLED VALUE.    Culture (A)  Final    STAPHYLOCOCCUS AUREUS SUSCEPTIBILITIES PERFORMED ON PREVIOUS CULTURE WITHIN THE LAST 5 DAYS. Performed at [facility], [place] [address], [place], [place] [address]    Report Status 09/16/2017 FINAL  Final  Culture, blood (Routine X 2) w Reflex to ID Panel     Status: Abnormal   Collection Time: 09/13/17  4:00 PM  Result Value Ref Range Status   Specimen Description BLOOD RIGHT ARM  Final   Special Requests IN PEDIATRIC BOTTLE Blood Culture adequate volume  Final   Culture  Setup Time   Final     GRAM POSITIVE COCCI IN CLUSTERS IN PEDIATRIC BOTTLE CRITICAL RESULT CALLED TO, READ BACK BY AND VERIFIED WITH: Carlis Stable [id number] MLM Performed at [facility], [place] [address] [place],  [address]    Culture STAPHYLOCOCCUS AUREUS (A)  Final   Report Status 09/16/2017 FINAL  Final   Organism ID, Bacteria STAPHYLOCOCCUS AUREUS  Final      Susceptibility   Staphylococcus aureus - MIC*    CIPROFLOXACIN <=0.5 SENSITIVE Sensitive     ERYTHROMYCIN <=0.25 SENSITIVE Sensitive     GENTAMICIN <=0.5 SENSITIVE Sensitive     OXACILLIN <=0.25 SENSITIVE Sensitive     TETRACYCLINE <=1 SENSITIVE Sensitive     VANCOMYCIN <=0.5 SENSITIVE Sensitive     TRIMETH/SULFA <=10 SENSITIVE Sensitive     CLINDAMYCIN <=0.25 SENSITIVE Sensitive     RIFAMPIN <=0.5 SENSITIVE Sensitive     Inducible Clindamycin NEGATIVE Sensitive     * STAPHYLOCOCCUS AUREUS  Culture, blood (routine x 2)     Status: None (Preliminary result)   Collection Time: 09/16/17  9:44 AM  Result Value Ref Range Status   Specimen Description BLOOD RIGHT HAND  Final   Special Requests  Final    BOTTLES DRAWN AEROBIC AND ANAEROBIC Blood Culture results may not be optimal due to an inadequate volume of blood received in culture bottles   Culture   Final    NO GROWTH 1 [place] Performed at [place] [address] [place], [place] [address]    Report Status PENDING  Incomplete  Culture, blood (routine x 2)     Status: None (Preliminary result)   Collection Time: 09/16/17  9:52 AM  Result Value Ref Range Status   Specimen Description BLOOD LEFT HAND  Final   Special Requests   Final    BOTTLES DRAWN AEROBIC AND ANAEROBIC Blood Culture adequate volume   Culture   Final    NO GROWTH 1 [place] Performed at [facility], [place] [address] [place], [place] [address]    Report Status PENDING  Incomplete         Radiology Studies: Mr Jeri Cos QI Contrast  Result Date: 09/18/2017 CLINICAL DATA:  Endocarditis  with muscle weakness and right hand numbness. EXAM: MRI HEAD WITHOUT AND WITH CONTRAST TECHNIQUE: Multiplanar, multiecho pulse sequences of the brain and surrounding structures were obtained without and with intravenous contrast. CONTRAST:  69mL MULTIHANCE GADOBENATE DIMEGLUMINE 529 MG/ML IV SOLN COMPARISON:  Head CT 09/12/2017 FINDINGS: Brain: The midline structures are normal. There is no acute infarct or acute hemorrhage. No mass lesion, hydrocephalus, dural abnormality or extra-axial collection. Focus of hyperintense T2-weighted signal within the left frontal periventricular white matter. Otherwise normal parenchymal signal. There is atrophy greater than expected for age with ex vacuo dilatation of the lateral ventricles. 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 No chronic microhemorrhage or superficial siderosis. Vascular: Major intracranial arterial and venous sinus flow voids are preserved. Skull and upper cervical spine: The visualized skull base, calvarium, upper cervical spine and extracranial soft tissues are normal. Sinuses/Orbits: No fluid levels or advanced mucosal thickening. No mastoid or middle ear effusion. Normal orbits. IMPRESSION: Age advanced volume loss with ex vacuo dilatation of the ventricles, but no acute abnormality. Electronically Signed   By: Ulyses Jarred M.D.   On: 09/18/2017 02:30        Scheduled Meds: . buprenorphine-naloxone  1 tablet Sublingual BID  . famotidine  20 mg Oral Daily  . nicotine  14 mg Transdermal Daily   Continuous Infusions: . sodium chloride 100 mL/hr at 09/18/17 1227  .  ceFAZolin (ANCEF) IV Stopped (09/18/17 0722)     LOS: 7 days    Time spent: 35 minutes.     Elmarie Shiley, MD [facility] Pager [phone]  If 7PM-7AM, please contact night-coverage [email or web address] Password [facility] 09/18/2017, 12:52 PM

## 2017-09-18 NOTE — Progress Notes (Signed)
Patient for more MRI's that are wo/with contrast. Patient had MRI contrast at 0152 on 2/17 and can not receive contrast for 48 hours.  RN informed at 63.

## 2017-09-19 ENCOUNTER — Inpatient Hospital Stay (HOSPITAL_COMMUNITY): Payer: Self-pay

## 2017-09-19 DIAGNOSIS — I368 Other nonrheumatic tricuspid valve disorders: Secondary | ICD-10-CM

## 2017-09-19 DIAGNOSIS — I8001 Phlebitis and thrombophlebitis of superficial vessels of right lower extremity: Secondary | ICD-10-CM | POA: Diagnosis present

## 2017-09-19 DIAGNOSIS — M79609 Pain in unspecified limb: Secondary | ICD-10-CM

## 2017-09-19 LAB — CBC
HEMATOCRIT: 31.8 % — AB (ref 39.0–52.0)
HEMOGLOBIN: 10.5 g/dL — AB (ref 13.0–17.0)
MCH: 30.8 pg (ref 26.0–34.0)
MCHC: 33 g/dL (ref 30.0–36.0)
MCV: 93.3 fL (ref 78.0–100.0)
Platelets: 200 10*3/uL (ref 150–400)
RBC: 3.41 MIL/uL — AB (ref 4.22–5.81)
RDW: 13 % (ref 11.5–15.5)
WBC: 9.2 10*3/uL (ref 4.0–10.5)

## 2017-09-19 MED ORDER — ENOXAPARIN SODIUM 80 MG/0.8ML ~~LOC~~ SOLN
70.0000 mg | Freq: Two times a day (BID) | SUBCUTANEOUS | Status: DC
  Administered 2017-09-19 – 2017-10-01 (×21): 70 mg via SUBCUTANEOUS
  Filled 2017-09-19 (×29): qty 0.8

## 2017-09-19 MED ORDER — ENOXAPARIN SODIUM 80 MG/0.8ML ~~LOC~~ SOLN: 70.0000 mg | Freq: Two times a day (BID) | SUBCUTANEOUS | Status: DC

## 2017-09-19 MED ORDER — OXYCODONE HCL 5 MG PO TABS
5.0000 mg | ORAL_TABLET | Freq: Once | ORAL | Status: AC
  Administered 2017-09-19: 5 mg via ORAL
  Filled 2017-09-19: qty 1

## 2017-09-19 MED ORDER — FERROUS SULFATE 325 (65 FE) MG PO TABS
325.0000 mg | ORAL_TABLET | Freq: Two times a day (BID) | ORAL | Status: DC
  Administered 2017-09-19 – 2017-11-05 (×87): 325 mg via ORAL
  Filled 2017-09-19 (×86): qty 1

## 2017-09-19 NOTE — Progress Notes (Signed)
ANTICOAGULATION CONSULT NOTE - Initial Consult  Pharmacy Consult for Lovenox Indication: DVT  Allergies  Allergen Reactions  . Prednisone Hives  . Amoxicillin Hives    Patient Measurements: Height: 5\' 9"  (175.3 cm) Weight: 160 lb 15 oz (73 kg) IBW/kg (Calculated) : 70.7 Lovenox Dosing Weight: 70 kg  Vital Signs: Temp: 99.5 F (37.5 C) (02/18 0942) Temp Source: Oral (02/18 0942) BP: 108/84 (02/18 0942) Pulse Rate: 113 (02/18 0942)  Labs: Recent Labs    09/18/17 0508 09/19/17 0603  HGB 8.4* 10.5*  HCT 25.5* 31.8*  PLT 229 200  CREATININE 0.69  --     Estimated Creatinine Clearance: 133.8 mL/min (by C-G formula based on SCr of 0.69 mg/dL).   Medical History: Past Medical History:  Diagnosis Date  . Bacterial endocarditis   . Heroin abuse ([place])   . Liver disease    possible Hep. C no treatment or confirmation   . Opiate abuse, continuous ([place])   . Polysubstance abuse ([place])   . Seizures ([place])    Assessment: 29 yoM with history of recurrent IVDU admitted with MSSA bacteremia and metastatic infection including tricuspid valve endocarditis, septic knee bursitis, septic thrombophlebitis of the saphenous vein. Bilateral lower extremity venous duplex shows evidence of acute DVT of the right lower extremity.   H/H: low but stable, PLT wnl  Goal of Therapy:  Monitor platelets by anticoagulation protocol: Yes   Plan:  Lovenox 70 mg every 12 hours  Monitor CBC, renal function, and clinical progress Monitor for signs/symptoms of bleeding   Moraima Burd L. Kyung Rudd, PharmD, [place] PGY1 Pharmacy Resident Pager: [phone]

## 2017-09-19 NOTE — Progress Notes (Signed)
PT Cancellation Note  Patient Details Name: Gary Hicks MRN: [id number] DOB: Jul 29, 1986   Cancelled Treatment:    Reason Eval/Treat Not Completed: Other (comment)(Refused due to just had pain meds, asks PT to return in 1-2 hours. )   Denice Paradise 09/19/2017, 11:11 AM [facility] [phone] (pager)

## 2017-09-19 NOTE — Progress Notes (Signed)
[facility]  Date of Admission:  09/11/2017     Total days of antibiotics 8  Ancef 09/12/17  Dose of Oritavancin 09/15/17         Patient ID: Gary Hicks is a 32 y.o. male admitted with MSSA bacteremia and metastatic infection including:  Principal Problem:   Endocarditis of tricuspid valve Active Problems:   Bacteremia due to Staphylococcus aureus   Staphylococcal arthritis of right shoulder (HCC)   Septic infrapatellar bursitis of left knee   Staphylococcal arthritis of left hip (HCC)   Staphylococcal arthritis of right hip (HCC)   Pyomyositis   Saphenous vein thrombophlebitis, right   Heroin abuse (HCC)   Polysubstance abuse (HCC)   Severe sepsis (HCC)   Cigarette smoker   Hypokalemia   DIC (disseminated intravascular coagulation) ([place])   Calf abscess   Pain   . buprenorphine-naloxone  1 tablet Sublingual BID  . enoxaparin (LOVENOX) injection  40 mg Subcutaneous Q24H  . famotidine  20 mg Oral Daily  . feeding supplement (ENSURE ENLIVE)  237 mL Oral BID BM  . nicotine  14 mg Transdermal Daily    INTERVAL HX: Still having a lot of pain mostly isolated to his right thigh. He is tearful today during our visit. He has ongoing numbness to his left #4-5 digits and now with more swelling to his left forearm that is not as painful. Tolerating his abx well. Still having fevers.   Allergies  Allergen Reactions  . Prednisone Hives  . Amoxicillin Hives    OBJECTIVE: Vitals:   09/18/17 1600 09/18/17 2132 09/19/17 0546 09/19/17 0942  BP: 130/83 127/76 117/74 108/84  Pulse: (!) 102 (!) 106 (!) 111 (!) 113  Resp: 18 16 14 16   Temp: 98.9 F (37.2 C) (!) 100.7 F (38.2 C) 99.2 F (37.3 C) 99.5 F (37.5 C)  TempSrc: Oral   Oral  SpO2: 98% 99% 96% 97%  Weight:      Height:       Body mass index is 23.77 kg/m.  Physical Exam  Constitutional: He is oriented to person, place, and time.  Thin   HENT:  Mouth/Throat: Oropharynx is  clear and moist. No oral lesions. Normal dentition. No dental caries.  Eyes: No scleral icterus.  Neck: JVD present.  Cardiovascular: Regular rhythm and intact distal pulses. Tachycardia present.  Murmur heard. Pulmonary/Chest: Effort normal and breath sounds normal. No respiratory distress. He exhibits no tenderness.  Abdominal: Soft. He exhibits no distension. There is no tenderness.  Musculoskeletal:       Arms:      Legs: Lymphadenopathy:    He has no cervical adenopathy.  Neurological: He is alert and oriented to person, place, and time.  Skin: Skin is warm and dry. No rash noted.  Psychiatric: Mood and affect normal.  Vitals reviewed.   Lab Results Lab Results  Component Value Date   WBC 9.2 09/19/2017   HGB 10.5 (L) 09/19/2017   HCT 31.8 (L) 09/19/2017   MCV 93.3 09/19/2017   PLT 200 09/19/2017    Lab Results  Component Value Date   CREATININE 0.69 09/18/2017   BUN 9 09/18/2017   NA 131 (L) 09/18/2017   K 4.0 09/18/2017   CL 101 09/18/2017   CO2 20 (L) 09/18/2017    Lab Results  Component Value Date   ALT 46 09/14/2017   AST 97 (H) 09/14/2017   ALKPHOS 91 09/14/2017  BILITOT 0.7 09/14/2017     Microbiology: BCx 2/09 >> 2/2 MSSA  BCx 2/10>> 1/2 MSSA  BCx 2/12 >> 2/2 MSSA BCx 2/15 >> NG x 2d   TTE 2/11 >> large 1.1 cm x 1.8 cm  Vegetation and moderate to severe regurgitation   [place] R Leg 2/18 >>saphenous vein thrombosis in lower right thigh  MRI R Shoulder >> Mild supraspinatus and infraspinatus muscle edema suggestive of Myositis.  Mild subacromial/subdeltoid bursitis.  MRI Head > neg for abscess   MRI C Spine > pending  MRI Right Femur > pending   ASSESSMENT: 32 y.o. male with history of recent and recurrent IV drug use admitted with MSSA bacteremia and metastatic infection including tricuspid valve endocarditis, septic knee bursitis, septic thrombophlebitis of the saphenous vein and likely a deep ulnar vessel.   PLAN: 1. Continue Ancef    2. Await further MRI of CSpine. May be the numbness is from thrombophlebitis of vessels in medial forearm. May need to ultrasound the left forearm nodule looking for thrombophlebitis.  3. Hold off on PICC until BCx are negative at least 72 hours AND plan in place for dispo for treatment. He has already left AMA once and had drugs on him and brought to him in hospital.   Janene Madeira, MSN, NP-C [facility] [place] Cell: [phone] Pager: [phone]  09/19/2017  11:38 AM

## 2017-09-19 NOTE — Progress Notes (Signed)
Physical Therapy Treatment Patient Details Name: Gary Hicks MRN: [id number] DOB: 1985/12/11 Today's Date: 09/19/2017    History of Present Illness 32yo male with active IV drug use presenting with generalized pain. Family is concerned about seizures due to finding him in fetal position with abnormal eye movements. He reports global polyarthralgia, use of heroin and methamphetamines in the past 24 hours at time of admission. Diagnosed with staph bacteremia, severe sepsis. PMH bacterial endocarditis, heroin abuse, possible Hepatitis C, opiate abuse, polysubstance abuse, seizures.  Pt also with positive right LE DVT on 2/18.  On Lovenox.      PT Comments    Pt admitted with above diagnosis. Pt currently with functional limitations due to the deficits listed below (see PT Problem List). Pt was able to ambulate with RW with min guard assist very slowly and deliberately due to right LE pain.  Pt self limiting but did work with PT with max encouragement.   Pt will benefit from skilled PT to increase their independence and safety with mobility to allow discharge to the venue listed below.     Follow Up Recommendations  Supervision for mobility/OOB     Equipment Recommendations  Rolling walker with 5" wheels;Crutches;Cane;Other (comment)(TBD as gait progresses)    Recommendations for Other Services       Precautions / Restrictions Precautions Precautions: None Restrictions Weight Bearing Restrictions: No    Mobility  Bed Mobility Overal bed mobility: Needs Assistance Bed Mobility: Supine to Sit     Supine to sit: Supervision     General bed mobility comments: supervision for safety as pt taking extensive extra time to move EOB.  Pt using his hands to move his left leg to EOB.   Transfers Overall transfer level: Needs assistance Equipment used: Rolling walker (2 wheeled) Transfers: Sit to/from Stand Sit to Stand: Min guard;From elevated surface         General transfer comment:  elevated bed to give pt an advantage on his first stand.  Min guard assist at trunk and to stabilize RW for safety during transitions.    Ambulation/Gait Ambulation/Gait assistance: Min guard;Min assist Ambulation Distance (Feet): 75 Feet Assistive device: Rolling walker (2 wheeled) Gait Pattern/deviations: Step-to pattern;Antalgic;Trunk flexed Gait velocity: decreased Gait velocity interpretation: Below normal speed for age/gender General Gait Details: Pt able to put weight on his right knee entire time with cues to push through UEs to unweight right LE.  Pt needed cues to sequence steps and RW and constant cues to stand tall.  When pt fatiguing, he would rest his elbows on RW and take a standing rest and then continue ambulating.  Moderately antalgic gait pattern min guard assist for safety, balance, and steering RW during gait.    Stairs            Wheelchair Mobility    Modified Rankin (Stroke Patients Only)       Balance Overall balance assessment: Needs assistance Sitting-balance support: Feet supported;No upper extremity supported Sitting balance-Leahy Scale: Good     Standing balance support: Bilateral upper extremity supported;Single extremity supported Standing balance-Leahy Scale: Fair Standing balance comment: relies on UE support for balance                            Cognition Arousal/Alertness: Awake/alert Behavior During Therapy: WFL for tasks assessed/performed Overall Cognitive Status: Within Functional Limits for tasks assessed  Exercises Total Joint Exercises Ankle Circles/Pumps: AROM;Both;20 reps Quad Sets: AROM;Left;10 reps Heel Slides: AAROM;Left;10 reps    General Comments General comments (skin integrity, edema, etc.): Pt c/o nausea therefore Kami, nurse went and got nausea med and gave after pts walk.       Pertinent Vitals/Pain Pain Assessment: Faces Faces Pain Scale:  Hurts whole lot Pain Location: all o fhis joints, esepcially L knee Pain Descriptors / Indicators: Grimacing;Guarding Pain Intervention(s): Limited activity within patient's tolerance;Monitored during session;Premedicated before session;Repositioned    Home Living                      Prior Function            PT Goals (current goals can now be found in the care plan section) Acute Rehab PT Goals Patient Stated Goal: to decrease his pain Progress towards PT goals: Progressing toward goals    Frequency    Min 3X/week      PT Plan Current plan remains appropriate    Co-evaluation              AM-PAC PT "6 Clicks" Daily Activity  Outcome Measure  Difficulty turning over in bed (including adjusting bedclothes, sheets and blankets)?: A Little Difficulty moving from lying on back to sitting on the side of the bed? : A Little Difficulty sitting down on and standing up from a chair with arms (e.g., wheelchair, bedside commode, etc,.)?: A Lot Help needed moving to and from a bed to chair (including a wheelchair)?: A Little Help needed walking in hospital room?: A Little Help needed climbing 3-5 steps with a railing? : A Lot 6 Click Score: 16    End of Session Equipment Utilized During Treatment: Gait belt Activity Tolerance: Patient limited by pain;Patient limited by fatigue Patient left: with call bell/phone within reach;in bed Nurse Communication: Mobility status PT Visit Diagnosis: Muscle weakness (generalized) (M62.81);Difficulty in walking, not elsewhere classified (R26.2);Pain Pain - Right/Left: Left Pain - part of body: Knee     Time: 8329-1916 PT Time Calculation (min) (ACUTE ONLY): 26 min  Charges:  $Gait Training: 23-37 mins                    G Codes:       Emmalou Hunger,PT Acute Rehabilitation [phone] (pager)    Denice Paradise 09/19/2017, 3:23 PM

## 2017-09-19 NOTE — Progress Notes (Signed)
Initial Nutrition Assessment  DOCUMENTATION CODES:   Not applicable  INTERVENTION:   Ensure Enlive po BID, each supplement provides 350 kcal and 20 grams of protein  Snacks between meals   NUTRITION DIAGNOSIS:   Increased nutrient needs related to acute illness as evidenced by estimated needs.  GOAL:   Patient will meet greater than or equal to 90% of their needs  MONITOR:   PO intake, Supplement acceptance, Labs, Weight trends  REASON FOR ASSESSMENT:   Malnutrition Screening Tool    ASSESSMENT:   32 yo male admitted severe sepsis with bacterial endocarditis, MSSA bacteremia. Pt with active IV drug abuse (meth and heroin)   Pt reports appetite is fair currently. Pt had not touched breakfast tray this AM but drinking Ensure and reports he likes. Recorded po intake 0-75%. Pt reports generally he has a very good appetite  Pt reports UBW around 140-145 pounds, current wt 160 pounds.   Labs: sodium 131 Meds: NS at 100 ml/hr  Diet Order:  Diet regular Room service appropriate? Yes; Fluid consistency: Thin Seizure precautions  EDUCATION NEEDS:   Education needs have been addressed  Skin:  Skin Assessment: Reviewed RN Assessment  Last BM:  2/17  Height:   Ht Readings from Last 1 Encounters:  09/12/17 5\' 9"  (1.753 m)    Weight:   Wt Readings from Last 1 Encounters:  09/17/17 160 lb 15 oz (73 kg)    Ideal Body Weight:     BMI:  Body mass index is 23.77 kg/m.  Estimated Nutritional Needs:   Kcal:  2200-2400 kcals  Protein:  110-120 g  Fluid:  >/= 2 L   Kerman Passey MS, RD, LDN, CNSC [phone] Pager  [phone] Weekend/On-Call Pager

## 2017-09-19 NOTE — Progress Notes (Signed)
PROGRESS NOTE    Gary Hicks  CBJ:[id number] DOB: 20-Sep-1985 DOA: 09/11/2017 PCP: Patient, [name] Per   Brief Narrative: This is a 32 year old man with active IV drug abuse, who presents with generalized pain. History is limited secondary to his acute illness including pain and fluctuations in alertness,his father is present at the bedside assists with history. The patient reports that the painhasbeen happening since the end of January, father reports that 2-3 days ago he was concerned the patient may have had a seizure because he was found unresponsive with some abnormal eye movements in the fetal position. The patient was incarcerated and released 3-4 weeks ago. Patient admits using IV methamphetamine as well as heroin in the past 24 hours prior to admission. Reports global polyarthralgias including his bilateral feet bilateral knees bilateral hips and shoulders. Also reports fevers and chills generalized myalgias. Acutely worsened today for what she calls father who brought him to seek medical attention.   Assessment & Plan:   Principal Problem:   Endocarditis of tricuspid valve Active Problems:   Heroin abuse (HCC)   Polysubstance abuse (HCC)   Severe sepsis (HCC)   Bacteremia due to Staphylococcus aureus   Cigarette smoker   Hypokalemia   DIC (disseminated intravascular coagulation) (HCC)   Staphylococcal arthritis of right shoulder (HCC)   Septic infrapatellar bursitis of left knee   Staphylococcal arthritis of left hip (HCC)   Staphylococcal arthritis of right hip (HCC)   Calf abscess   Pain   Pyomyositis   Saphenous vein thrombophlebitis, right   1-Tricuspid Valve endocarditis, MSSA bacteremia, endocarditis.  Continue with ancef.  Spike fever last night Blood culture 2-11 Staphylococcus MSSA Repeated blood culture 2-12 Staph  Repeated Blood culture 2-15 no growth in 48 hours.   2-Multiples joints pain;  -MRI left knee ordered.  -MRI hip;  Mild soft tissue  anasarca about the pelvis and both hips. No drainable fluid collection or abscess. Nonspecific mild intramuscular edema about both hips question myositis. Small bilateral hip joint effusions without frank bone destruction or marrow signal abnormalities to suggest a septic joint. He was evaluated by ortho at that time.  On IV dilaudid.  MRI Right shoulder; Mild supraspinatus and infraspinatus muscle edema suggestive of Myositis.  Mild subacromial/subdeltoid bursitis. Pain management.  Complaints today of worsening right thigh pain, induration. Discussed with Dr Lucianne Lei dam, will get MRI of thigh. I will also get [place].  For left hand numbness, will get MRI cervical spine. MRI brain negative for abscess.   3-Sepsis secondary to number one. On IV fluids, IV antibiotics.   4-Heroin use;  IM teaching service consulted , to consider suboxone.  I really appreciate Dr Damita Dunnings help with Suboxone.  On Suboxone BID>   5-Convulsions Likely related to drug use Seizure precautions PRN Ativan    6-Anemia; iron deficiency. Start iron tablet.   7-DVT right Gastrocnemius vein;  Also superficial saphenous thrombophlebitis.  Start Lovenox per pharmacy. Discussed with patient.  Monitor platelet and HB.   Thrombocytopenia.  Resolved.   Hyponatremia; IV fluids.   Hepatitis c antibody positive;    DVT prophylaxis: start Lovenox, platelet  normalized.  Code Status: full code.  Family Communication: mother and father at bedside.  Disposition Plan: to be determine    Consultants:   ID  Ortho    Procedures:  ECHo;  - Left ventricle: The cavity size was normal. Systolic function was   normal. The estimated ejection fraction was in the range of 60%  to 65%. Wall motion was normal; there were no regional wall   motion abnormalities. - Left atrium: The atrium was mildly dilated. - Right ventricle: The cavity size was moderately dilated. Wall   thickness was normal. - Right atrium: The atrium was  moderately dilated. - Tricuspid valve: There was a large, 1.1 cm (W) x 1.8 cm (L),   multilobulated, highly mobile vegetation on the right ventricular   aspect of the anterior leaflet. There was moderate-severe   regurgitation. - Pulmonary arteries: Systolic pressure was mildly increased. PA   peak pressure: 44 mm Hg (S). - Pericardium, extracardiac: A trivial pericardial effusion was    identified posterior to the heart.  Antimicrobials: Ancef 2-11  Subjective: He is complaining of right leg pain, left knee pain.   Objective: Vitals:   09/18/17 1600 09/18/17 2132 09/19/17 0546 09/19/17 0942  BP: 130/83 127/76 117/74 108/84  Pulse: (!) 102 (!) 106 (!) 111 (!) 113  Resp: 18 16 14 16   Temp: 98.9 F (37.2 C) (!) 100.7 F (38.2 C) 99.2 F (37.3 C) 99.5 F (37.5 C)  TempSrc: Oral   Oral  SpO2: 98% 99% 96% 97%  Weight:      Height:        Intake/Output Summary (Last 24 hours) at 09/19/2017 1523 Last data filed at 09/19/2017 1426 Gross per 24 hour  Intake 3640 ml  Output 5650 ml  Net -2010 ml   Filed Weights   09/15/17 2043 09/16/17 2115 09/17/17 2040  Weight: 73.5 kg (162 lb 0.6 oz) 73.1 kg (161 lb 2.5 oz) 73 kg (160 lb 15 oz)    Examination:  General exam: NAD Respiratory system: CTA Cardiovascular system: S 1, S 2 RRR Gastrointestinal system: BS present, soft.  nt Central nervous system: alert  Extremities: left knee with mild edema, no significant redness.  Skin: petechia rash    Data Reviewed: I have personally reviewed following labs and imaging studies  CBC: Recent Labs  Lab 09/13/17 0914 09/14/17 0806 09/16/17 0633 09/18/17 0508 09/19/17 0603  WBC 8.1 13.9* 12.4* 9.3 9.2  NEUTROABS 5.8 11.7*  --   --   --   HGB 8.7* 8.0* 8.7* 8.4* 10.5*  HCT 25.3* 23.9* 25.6* 25.5* 31.8*  MCV 93.7 93.4 94.1 93.4 93.3  PLT 52* 66* 147* 229 209   Basic Metabolic Panel: Recent Labs  Lab 09/13/17 0914 09/14/17 0806 09/16/17 0633 09/18/17 0508  NA 132* 130*  132* 131*  K 3.3* 4.2 4.2 4.0  CL 102 104 103 101  CO2 19* 19* 20* 20*  GLUCOSE 143* 105* 94 92  BUN 9 9 9 9   CREATININE 0.99 0.81 0.84 0.69  CALCIUM 7.1* 7.0* 7.5* 7.6*   GFR: Estimated Creatinine Clearance: 133.8 mL/min (by C-G formula based on SCr of 0.69 mg/dL). Liver Function Tests: Recent Labs  Lab 09/14/17 0806  AST 97*  ALT 46  ALKPHOS 91  BILITOT 0.7  PROT 5.2*  ALBUMIN 1.4*   No results for input(s): LIPASE, AMYLASE in the last 168 hours. No results for input(s): AMMONIA in the last 168 hours. Coagulation Profile: No results for input(s): INR, PROTIME in the last 168 hours. Cardiac Enzymes: No results for input(s): CKTOTAL, CKMB, CKMBINDEX, TROPONINI in the last 168 hours. BNP (last 3 results) No results for input(s): PROBNP in the last 8760 hours. HbA1C: No results for input(s): HGBA1C in the last 72 hours. CBG: No results for input(s): GLUCAP in the last 168 hours. Lipid Profile: No results for  input(s): CHOL, HDL, LDLCALC, TRIG, CHOLHDL, LDLDIRECT in the last 72 hours. Thyroid Function Tests: No results for input(s): TSH, T4TOTAL, FREET4, T3FREE, THYROIDAB in the last 72 hours. Anemia Panel: Recent Labs    09/18/17 0508 09/18/17 1737  VITAMINB12  --  725  FOLATE  --  10.6  FERRITIN  --  378*  TIBC  --  182*  IRON  --  19*  RETICCTPCT 1.9  --    Sepsis Labs: No results for input(s): PROCALCITON, LATICACIDVEN in the last 168 hours.  Recent Results (from the past 240 hour(s))  Blood culture (routine x 2)     Status: Abnormal   Collection Time: 09/10/17  7:45 PM  Result Value Ref Range Status   Specimen Description BLOOD RIGHT HAND  Final   Special Requests IN PEDIATRIC BOTTLE Blood Culture adequate volume  Final   Culture  Setup Time   Final    GRAM POSITIVE COCCI IN CLUSTERS IN PEDIATRIC BOTTLE CRITICAL RESULT CALLED TO, READ BACK BY AND VERIFIED WITH: T RUDISILL,PHARMD AT 4098 09/11/17 BY L BENFIELD Performed at [facility], [address], [place], [place] [address]    Culture STAPHYLOCOCCUS AUREUS (A)  Final   Report Status 09/13/2017 FINAL  Final   Organism ID, Bacteria STAPHYLOCOCCUS AUREUS  Final      Susceptibility   Staphylococcus aureus - MIC*    CIPROFLOXACIN <=0.5 SENSITIVE Sensitive     ERYTHROMYCIN <=0.25 SENSITIVE Sensitive     GENTAMICIN <=0.5 SENSITIVE Sensitive     OXACILLIN 0.5 SENSITIVE Sensitive     TETRACYCLINE <=1 SENSITIVE Sensitive     VANCOMYCIN 1 SENSITIVE Sensitive     TRIMETH/SULFA <=10 SENSITIVE Sensitive     CLINDAMYCIN <=0.25 SENSITIVE Sensitive     RIFAMPIN <=0.5 SENSITIVE Sensitive     Inducible Clindamycin NEGATIVE Sensitive     * STAPHYLOCOCCUS AUREUS  Blood Culture ID Panel (Reflexed)     Status: Abnormal   Collection Time: 09/10/17  7:45 PM  Result Value Ref Range Status   Enterococcus species NOT DETECTED NOT DETECTED Final   Vancomycin resistance NOT DETECTED NOT DETECTED Final   Listeria monocytogenes NOT DETECTED NOT DETECTED Final   Staphylococcus species DETECTED (A) NOT DETECTED Final    Comment: CRITICAL RESULT CALLED TO, READ BACK BY AND VERIFIED WITH: T RUDISILL,PHARMD AT 7829 09/11/17 BY L BENFIELD    Staphylococcus aureus DETECTED (A) NOT DETECTED Final    Comment: CRITICAL RESULT CALLED TO, READ BACK BY AND VERIFIED WITH: T RUDISILL,PHARMD AT 5621 09/11/17 BY L BENFIELD Methicillin (oxacillin) susceptible Staphylococcus aureus (MSSA). Preferred therapy is anti staphylococcal beta lactam antibiotic (Cefazolin or Nafcillin), unless clinically contraindicated.    Methicillin resistance NOT DETECTED NOT DETECTED Final   Streptococcus species NOT DETECTED NOT DETECTED Final   Streptococcus agalactiae NOT DETECTED NOT DETECTED Final   Streptococcus pneumoniae NOT DETECTED NOT DETECTED Final   Streptococcus pyogenes NOT DETECTED NOT DETECTED Final   Acinetobacter baumannii NOT DETECTED NOT DETECTED Final   Enterobacteriaceae species NOT DETECTED NOT DETECTED Final    Enterobacter cloacae complex NOT DETECTED NOT DETECTED Final   Escherichia coli NOT DETECTED NOT DETECTED Final   Klebsiella oxytoca NOT DETECTED NOT DETECTED Final   Klebsiella pneumoniae NOT DETECTED NOT DETECTED Final   Proteus species NOT DETECTED NOT DETECTED Final   Serratia marcescens NOT DETECTED NOT DETECTED Final   Carbapenem resistance NOT DETECTED NOT DETECTED Final   Haemophilus influenzae NOT DETECTED NOT DETECTED Final   Neisseria  meningitidis NOT DETECTED NOT DETECTED Final   Pseudomonas aeruginosa NOT DETECTED NOT DETECTED Final   Candida albicans NOT DETECTED NOT DETECTED Final   Candida glabrata NOT DETECTED NOT DETECTED Final   Candida krusei NOT DETECTED NOT DETECTED Final   Candida parapsilosis NOT DETECTED NOT DETECTED Final   Candida tropicalis NOT DETECTED NOT DETECTED Final    Comment: Performed at [facility], [place] [address] [place], [place] [address]  Blood culture (routine x 2)     Status: Abnormal   Collection Time: 09/10/17  9:50 PM  Result Value Ref Range Status   Specimen Description BLOOD LEFT ANTECUBITAL  Final   Special Requests IN PEDIATRIC BOTTLE Blood Culture adequate volume  Final   Culture  Setup Time   Final    GRAM POSITIVE COCCI IN PEDIATRIC BOTTLE CRITICAL VALUE NOTED.  VALUE IS CONSISTENT WITH PREVIOUSLY REPORTED AND CALLED VALUE.    Culture (A)  Final    STAPHYLOCOCCUS AUREUS SUSCEPTIBILITIES PERFORMED ON PREVIOUS CULTURE WITHIN THE LAST 5 DAYS. Performed at [facility], [place] [address], [place], [place] [address]    Report Status 09/13/2017 FINAL  Final  Culture, blood (routine x 2)     Status: None   Collection Time: 09/11/17  6:35 PM  Result Value Ref Range Status   Specimen Description BLOOD LEFT ANTECUBITAL  Final   Special Requests   Final    BOTTLES DRAWN AEROBIC AND ANAEROBIC Blood Culture adequate volume   Culture   Final    NO GROWTH 5 DAYS Performed at [facility], [place] [address], [place],  [place] [address]    Report Status 09/16/2017 FINAL  Final  Culture, blood (routine x 2)     Status: Abnormal   Collection Time: 09/11/17  9:30 PM  Result Value Ref Range Status   Specimen Description BLOOD RIGHT HAND  Final   Special Requests IN PEDIATRIC BOTTLE Blood Culture adequate volume  Final   Culture  Setup Time   Final    GRAM POSITIVE COCCI IN CLUSTERS IN PEDIATRIC BOTTLE CRITICAL RESULT CALLED TO, READ BACK BY AND VERIFIED WITH: PHARMD B MARTIN [id number] MLM    Culture (A)  Final    STAPHYLOCOCCUS AUREUS SUSCEPTIBILITIES PERFORMED ON PREVIOUS CULTURE WITHIN THE LAST 5 DAYS. Performed at [facility], [place] [address], [place], [place] [address]    Report Status 09/14/2017 FINAL  Final  Blood Culture ID Panel (Reflexed)     Status: Abnormal   Collection Time: 09/11/17  9:30 PM  Result Value Ref Range Status   Enterococcus species NOT DETECTED NOT DETECTED Final   Vancomycin resistance NOT DETECTED NOT DETECTED Final   Listeria monocytogenes NOT DETECTED NOT DETECTED Final   Staphylococcus species DETECTED (A) NOT DETECTED Final    Comment: CRITICAL RESULT CALLED TO, READ BACK BY AND VERIFIED WITH: T DANG PHARMD 1833 09/12/17 A BROWNING    Staphylococcus aureus DETECTED (A) NOT DETECTED Final    Comment: Methicillin (oxacillin) susceptible Staphylococcus aureus (MSSA). Preferred therapy is anti staphylococcal beta lactam antibiotic (Cefazolin or Nafcillin), unless clinically contraindicated. CRITICAL RESULT CALLED TO, READ BACK BY AND VERIFIED WITH: T DANG PHARMD 1833 09/12/17 A BROWNING    Methicillin resistance NOT DETECTED NOT DETECTED Final   Streptococcus species NOT DETECTED NOT DETECTED Final   Streptococcus agalactiae NOT DETECTED NOT DETECTED Final   Streptococcus pneumoniae NOT DETECTED NOT DETECTED Final   Streptococcus pyogenes NOT DETECTED NOT DETECTED Final  Acinetobacter baumannii NOT DETECTED NOT DETECTED Final   Enterobacteriaceae species NOT DETECTED  NOT DETECTED Final   Enterobacter cloacae complex NOT DETECTED NOT DETECTED Final   Escherichia coli NOT DETECTED NOT DETECTED Final   Klebsiella oxytoca NOT DETECTED NOT DETECTED Final   Klebsiella pneumoniae NOT DETECTED NOT DETECTED Final   Proteus species NOT DETECTED NOT DETECTED Final   Serratia marcescens NOT DETECTED NOT DETECTED Final   Carbapenem resistance NOT DETECTED NOT DETECTED Final   Haemophilus influenzae NOT DETECTED NOT DETECTED Final   Neisseria meningitidis NOT DETECTED NOT DETECTED Final   Pseudomonas aeruginosa NOT DETECTED NOT DETECTED Final   Candida albicans NOT DETECTED NOT DETECTED Final   Candida glabrata NOT DETECTED NOT DETECTED Final   Candida krusei NOT DETECTED NOT DETECTED Final   Candida parapsilosis NOT DETECTED NOT DETECTED Final   Candida tropicalis NOT DETECTED NOT DETECTED Final    Comment: Performed at [facility], [place] [address], [place], [place] [address]  Culture, blood (Routine X 2) w Reflex to ID Panel     Status: Abnormal   Collection Time: 09/13/17  3:50 PM  Result Value Ref Range Status   Specimen Description BLOOD RIGHT HAND  Final   Special Requests IN PEDIATRIC BOTTLE Blood Culture adequate volume  Final   Culture  Setup Time   Final    GRAM POSITIVE COCCI IN PEDIATRIC BOTTLE CRITICAL VALUE NOTED.  VALUE IS CONSISTENT WITH PREVIOUSLY REPORTED AND CALLED VALUE.    Culture (A)  Final    STAPHYLOCOCCUS AUREUS SUSCEPTIBILITIES PERFORMED ON PREVIOUS CULTURE WITHIN THE LAST 5 DAYS. Performed at [facility], [place] [address] [place], [place] [address]    Report Status 09/16/2017 FINAL  Final  Culture, blood (Routine X 2) w Reflex to ID Panel     Status: Abnormal   Collection Time: 09/13/17  4:00 PM  Result Value Ref Range Status   Specimen Description BLOOD RIGHT ARM  Final   Special Requests IN PEDIATRIC BOTTLE Blood Culture adequate volume  Final   Culture  Setup Time   Final    GRAM POSITIVE COCCI IN CLUSTERS IN  PEDIATRIC BOTTLE CRITICAL RESULT CALLED TO, READ BACK BY AND VERIFIED WITH: Carlis Stable [id number] MLM Performed at [facility], [place] [address], [place], [place] [address]    Culture STAPHYLOCOCCUS AUREUS (A)  Final   Report Status 09/16/2017 FINAL  Final   Organism ID, Bacteria STAPHYLOCOCCUS AUREUS  Final      Susceptibility   Staphylococcus aureus - MIC*    CIPROFLOXACIN <=0.5 SENSITIVE Sensitive     ERYTHROMYCIN <=0.25 SENSITIVE Sensitive     GENTAMICIN <=0.5 SENSITIVE Sensitive     OXACILLIN <=0.25 SENSITIVE Sensitive     TETRACYCLINE <=1 SENSITIVE Sensitive     VANCOMYCIN <=0.5 SENSITIVE Sensitive     TRIMETH/SULFA <=10 SENSITIVE Sensitive     CLINDAMYCIN <=0.25 SENSITIVE Sensitive     RIFAMPIN <=0.5 SENSITIVE Sensitive     Inducible Clindamycin NEGATIVE Sensitive     * STAPHYLOCOCCUS AUREUS  Culture, blood (routine x 2)     Status: None (Preliminary result)   Collection Time: 09/16/17  9:44 AM  Result Value Ref Range Status   Specimen Description BLOOD RIGHT HAND  Final   Special Requests   Final    BOTTLES DRAWN AEROBIC AND ANAEROBIC Blood Culture results may not be optimal due to an inadequate volume of blood received in culture bottles   Culture  Final    NO GROWTH 3 DAYS Performed at [facility], [place] [address] [place], [place] [address]    Report Status PENDING  Incomplete  Culture, blood (routine x 2)     Status: None (Preliminary result)   Collection Time: 09/16/17  9:52 AM  Result Value Ref Range Status   Specimen Description BLOOD LEFT HAND  Final   Special Requests   Final    BOTTLES DRAWN AEROBIC AND ANAEROBIC Blood Culture adequate volume   Culture   Final    NO GROWTH 3 DAYS Performed at [facility], [place] [address] [place], [place] [address]    Report Status PENDING  Incomplete         Radiology Studies: Mr Jeri Cos KV Contrast  Result Date: 09/18/2017 CLINICAL DATA:  Endocarditis with muscle weakness and right hand  numbness. EXAM: MRI HEAD WITHOUT AND WITH CONTRAST TECHNIQUE: Multiplanar, multiecho pulse sequences of the brain and surrounding structures were obtained without and with intravenous contrast. CONTRAST:  49mL MULTIHANCE GADOBENATE DIMEGLUMINE 529 MG/ML IV SOLN COMPARISON:  Head CT 09/12/2017 FINDINGS: Brain: The midline structures are normal. There is no acute infarct or acute hemorrhage. No mass lesion, hydrocephalus, dural abnormality or extra-axial collection. Focus of hyperintense T2-weighted signal within the left frontal periventricular white matter. Otherwise normal parenchymal signal. There is atrophy greater than expected for age with ex vacuo dilatation of the lateral ventricles. 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 No chronic microhemorrhage or superficial siderosis. Vascular: Major intracranial arterial and venous sinus flow voids are preserved. Skull and upper cervical spine: The visualized skull base, calvarium, upper cervical spine and extracranial soft tissues are normal. Sinuses/Orbits: No fluid levels or advanced mucosal thickening. No mastoid or middle ear effusion. Normal orbits. IMPRESSION: Age advanced volume loss with ex vacuo dilatation of the ventricles, but no acute abnormality. Electronically Signed   By: Ulyses Jarred M.D.   On: 09/18/2017 02:30   [place] Soft Tissue Non Vascular  Result Date: 09/19/2017 CLINICAL DATA:  Leg pain EXAM: ULTRASOUND RIGHT LOWER EXTREMITY LIMITED TECHNIQUE: Ultrasound examination of the lower extremity soft tissues was performed in the area of clinical concern. COMPARISON:  Four days prior FINDINGS: Medial distal thigh great saphenous vein thrombosis that appears complete. The wall appears thickened as well. A smaller and more superficial subcutaneous vein is also thrombosed. There is regional subcutaneous reticulation. No neighboring abscess. IMPRESSION: Great saphenous vein thrombosis in the lower right thigh. A smaller  neighboring subcutaneous vein is also thrombosed. Probable underlying thrombophlebitis. Consider DVT study to evaluate proximal extent. Electronically Signed   By: Monte Fantasia M.D.   On: 09/19/2017 09:47        Scheduled Meds: . buprenorphine-naloxone  1 tablet Sublingual BID  . enoxaparin (LOVENOX) injection  40 mg Subcutaneous Q24H  . famotidine  20 mg Oral Daily  . feeding supplement (ENSURE ENLIVE)  237 mL Oral BID BM  . nicotine  14 mg Transdermal Daily   Continuous Infusions: . sodium chloride 100 mL/hr at 09/19/17 0903  .  ceFAZolin (ANCEF) IV 2 g (09/19/17 1343)     LOS: 8 days    Time spent: 35 minutes.     Elmarie Shiley, MD [facility] Pager [phone]  If 7PM-7AM, please contact night-coverage [email or web address] Password [facility] 09/19/2017, 3:23 PM

## 2017-09-19 NOTE — Progress Notes (Signed)
Bilateral lower extremity venous duplex has been completed. There is evidence of acute deep vein thrombosis involving the intramuscular gastrocnemius vein of the right lower extremity.  There is also evidence of acute superficial vein thrombosis involving the greater saphenous vein, and a varicose vein in the distal calf of the right lower extremity. Results were given to the patient's nurse, Kami.  09/19/17 1:29 PM Gary Hicks RVT

## 2017-09-20 ENCOUNTER — Inpatient Hospital Stay (HOSPITAL_COMMUNITY): Payer: Self-pay

## 2017-09-20 DIAGNOSIS — I82409 Acute embolism and thrombosis of unspecified deep veins of unspecified lower extremity: Secondary | ICD-10-CM

## 2017-09-20 LAB — BASIC METABOLIC PANEL
Anion gap: 10 (ref 5–15)
BUN: 7 mg/dL (ref 6–20)
CO2: 24 mmol/L (ref 22–32)
Calcium: 8.1 mg/dL — ABNORMAL LOW (ref 8.9–10.3)
Chloride: 98 mmol/L — ABNORMAL LOW (ref 101–111)
Creatinine, Ser: 0.69 mg/dL (ref 0.61–1.24)
GFR calc Af Amer: >60 mL/min (ref >60)
GLUCOSE: 106 mg/dL — AB (ref 65–99)
POTASSIUM: 4 mmol/L (ref 3.5–5.1)
Sodium: 132 mmol/L — ABNORMAL LOW (ref 135–145)

## 2017-09-20 LAB — CBC
HCT: 29.6 % — ABNORMAL LOW (ref 39.0–52.0)
Hemoglobin: 9.9 g/dL — ABNORMAL LOW (ref 13.0–17.0)
MCH: 31.8 pg (ref 26.0–34.0)
MCHC: 33.4 g/dL (ref 30.0–36.0)
MCV: 95.2 fL (ref 78.0–100.0)
PLATELETS: 331 10*3/uL (ref 150–400)
RBC: 3.11 MIL/uL — AB (ref 4.22–5.81)
RDW: 13.3 % (ref 11.5–15.5)
WBC: 8.2 10*3/uL (ref 4.0–10.5)

## 2017-09-20 NOTE — Progress Notes (Signed)
Preliminary results by tech - Left Upper Ext. Venous Duplex Completed. Negative for deep and superficial vein thrombosis in the vein visualized. The cephalic was not visualized in the forearm. Oda Cogan, BS, RDMS, RVT

## 2017-09-20 NOTE — Progress Notes (Signed)
Department DD, [name], and Security staff spoke with the patient and his parents (both mother and father at the bedside) regarding visitation from his spouse due to concern regarding paraphernalia his spouse possibly brought to the patient earlier during his admission. At this time the wife is not to be permitted to visit as the DD completes her investigation to ensure safety of the patient. Patient and family are both aware. Dorthey Sawyer, RN

## 2017-09-20 NOTE — Progress Notes (Signed)
PROGRESS NOTE    Gary Hicks  QPY:[id number] DOB: February 12, 1986 DOA: 09/11/2017 PCP: Patient, [name] Per   Brief Narrative: This is a 32 year old man with active IV drug abuse, who presents with generalized pain. History is limited secondary to his acute illness including pain and fluctuations in alertness,his father is present at the bedside assists with history. The patient reports that the painhasbeen happening since the end of January, father reports that 2-3 days ago he was concerned the patient may have had a seizure because he was found unresponsive with some abnormal eye movements in the fetal position.  The patient was incarcerated and released 3-4 weeks ago. Patient admits using IV methamphetamine as well as heroin in the past 24 hours prior to admission. Reports global polyarthralgias including his bilateral feet bilateral knees bilateral hips and shoulders. Also reports fevers and chills generalized myalgias. Acutely worsened today for what she calls father who brought him to seek medical attention.  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 He is currently on IV Ancef. He was also diagnosed with DVT Right gastrocnemius vein . He was started on Lovenox 2-18, might be able to transition to NOAC once platelet and hb has been stable and no plan for procedure. Follow MRI results.    Assessment & Plan:   Principal Problem:   Endocarditis of tricuspid valve Active Problems:   Heroin abuse (HCC)   Polysubstance abuse (HCC)   Severe sepsis (HCC)   Bacteremia due to Staphylococcus aureus   Cigarette smoker   Hypokalemia   DIC (disseminated intravascular coagulation) (HCC)   Staphylococcal arthritis of right shoulder (HCC)   Septic infrapatellar bursitis of left knee   Staphylococcal arthritis of left hip (HCC)   Staphylococcal arthritis of right hip (HCC)   Calf abscess   Pain   Pyomyositis   Saphenous vein  thrombophlebitis, right   1-Tricuspid Valve endocarditis, MSSA bacteremia, endocarditis.  Continue with ancef.  Still spiking fever.  Blood culture 2-11 Staphylococcus MSSA Repeated blood culture 2-12 Staph  Repeated Blood culture 2-15 no growth in 48 hours.  ID following.   2-Multiples joints pain;  -MRI left knee ordered.  -MRI hip;  Mild soft tissue anasarca about the pelvis and both hips. No drainable fluid collection or abscess. Nonspecific mild intramuscular edema about both hips question myositis. Small bilateral hip joint effusions without frank bone destruction or marrow signal abnormalities to suggest a septic joint. He was evaluated by ortho at that time.  MRI Right shoulder; Mild supraspinatus and infraspinatus muscle edema suggestive of Myositis.  Mild subacromial/subdeltoid bursitis. MRI brain negative for abscess.  Right thigh pain; plan to get MRI  For left hand numbness, will get MRI cervical spine.  He wants his oxycodone to be change more frequent for pain. Per nursing staff patient has been sleepy and resting. I would not change oxycodone dose today. Also he is on Suboxone.  Follow doppler left arm.   3-Sepsis secondary to number one. On IV fluids, IV antibiotics.   4-Heroin use;  IM teaching service consulted , to consider suboxone.  I really appreciate Dr Damita Dunnings help with Suboxone.  On Suboxone BID> No IV ativan unless required for seizure. Marland Kitchen   5-Convulsions Likely related to drug use Seizure precautions PRN Ativan MRI negative.   6-Anemia; iron deficiency. Started  iron tablet.  Monitor hb on lovenox.   7-DVT right Gastrocnemius vein;  Also superficial saphenous thrombophlebitis.  Started  Lovenox per pharmacy. Discussed with patient.  Monitor platelet and HB.  Continue with lovenox.   Thrombocytopenia.  Resolved.   Hyponatremia; IV fluids.   Hepatitis c antibody positive;    DVT prophylaxis: start Lovenox, platelet  normalized.  Code Status:  full code.  Family Communication: father at bedside.  Disposition Plan: to be determine    Consultants:   ID  Ortho    Procedures:  ECHo;  - Left ventricle: The cavity size was normal. Systolic function was   normal. The estimated ejection fraction was in the range of 60%   to 65%. Wall motion was normal; there were no regional wall   motion abnormalities. - Left atrium: The atrium was mildly dilated. - Right ventricle: The cavity size was moderately dilated. Wall   thickness was normal. - Right atrium: The atrium was moderately dilated. - Tricuspid valve: There was a large, 1.1 cm (W) x 1.8 cm (L),   multilobulated, highly mobile vegetation on the right ventricular   aspect of the anterior leaflet. There was moderate-severe   regurgitation. - Pulmonary arteries: Systolic pressure was mildly increased. PA   peak pressure: 44 mm Hg (S). - Pericardium, extracardiac: A trivial pericardial effusion was    identified posterior to the heart.  Antimicrobials: Ancef 2-11  Subjective: He is lying in bed, keep eyes close. He is complaining of pain thigh, knee, left arm. He is asking for increase pain medication doses.   Objective: Vitals:   09/19/17 0942 09/19/17 1707 09/19/17 2059 09/20/17 0828  BP: 108/84 119/77 93/63 115/66  Pulse: (!) 113 (!) 122 (!) 108 (!) 109  Resp: 16 16 16 16   Temp: 99.5 F (37.5 C) (!) 100.9 F (38.3 C) 98.6 F (37 C) 98.7 F (37.1 C)  TempSrc: Oral Oral Oral Oral  SpO2: 97% 97% 98% 98%  Weight:   73 kg (160 lb 15 oz)   Height:        Intake/Output Summary (Last 24 hours) at 09/20/2017 1548 Last data filed at 09/20/2017 0352 Gross per 24 hour  Intake 1465 ml  Output 2000 ml  Net -535 ml   Filed Weights   09/16/17 2115 09/17/17 2040 09/19/17 2059  Weight: 73.1 kg (161 lb 2.5 oz) 73 kg (160 lb 15 oz) 73 kg (160 lb 15 oz)    Examination:  General exam: NAD Respiratory system: CTA Cardiovascular system: S 1, S 2 RRR Gastrointestinal  system: BS present, soft, nt Central nervous system: alert,  Extremities: left knee with mild edema, no significant redness.  Skin: petechia rash    Data Reviewed: I have personally reviewed following labs and imaging studies  CBC: Recent Labs  Lab 09/14/17 0806 09/16/17 0633 09/18/17 0508 09/19/17 0603 09/20/17 0823  WBC 13.9* 12.4* 9.3 9.2 8.2  NEUTROABS 11.7*  --   --   --   --   HGB 8.0* 8.7* 8.4* 10.5* 9.9*  HCT 23.9* 25.6* 25.5* 31.8* 29.6*  MCV 93.4 94.1 93.4 93.3 95.2  PLT 66* 147* 229 200 007   Basic Metabolic Panel: Recent Labs  Lab 09/14/17 0806 09/16/17 0633 09/18/17 0508 09/20/17 0823  NA 130* 132* 131* 132*  K 4.2 4.2 4.0 4.0  CL 104 103 101 98*  CO2 19* 20* 20* 24  GLUCOSE 105* 94 92 106*  BUN 9 9 9 7   CREATININE 0.81 0.84 0.69 0.69  CALCIUM 7.0* 7.5* 7.6* 8.1*   GFR: Estimated Creatinine Clearance: 133.8 mL/min (by C-G formula based on SCr of  0.69 mg/dL). Liver Function Tests: Recent Labs  Lab 09/14/17 0806  AST 97*  ALT 46  ALKPHOS 91  BILITOT 0.7  PROT 5.2*  ALBUMIN 1.4*   No results for input(s): LIPASE, AMYLASE in the last 168 hours. No results for input(s): AMMONIA in the last 168 hours. Coagulation Profile: No results for input(s): INR, PROTIME in the last 168 hours. Cardiac Enzymes: No results for input(s): CKTOTAL, CKMB, CKMBINDEX, TROPONINI in the last 168 hours. BNP (last 3 results) No results for input(s): PROBNP in the last 8760 hours. HbA1C: No results for input(s): HGBA1C in the last 72 hours. CBG: No results for input(s): GLUCAP in the last 168 hours. Lipid Profile: No results for input(s): CHOL, HDL, LDLCALC, TRIG, CHOLHDL, LDLDIRECT in the last 72 hours. Thyroid Function Tests: No results for input(s): TSH, T4TOTAL, FREET4, T3FREE, THYROIDAB in the last 72 hours. Anemia Panel: Recent Labs    09/18/17 0508 09/18/17 1737  VITAMINB12  --  725  FOLATE  --  10.6  FERRITIN  --  378*  TIBC  --  182*  IRON  --  19*    RETICCTPCT 1.9  --    Sepsis Labs: No results for input(s): PROCALCITON, LATICACIDVEN in the last 168 hours.  Recent Results (from the past 240 hour(s))  Blood culture (routine x 2)     Status: Abnormal   Collection Time: 09/10/17  7:45 PM  Result Value Ref Range Status   Specimen Description BLOOD RIGHT HAND  Final   Special Requests IN PEDIATRIC BOTTLE Blood Culture adequate volume  Final   Culture  Setup Time   Final    GRAM POSITIVE COCCI IN CLUSTERS IN PEDIATRIC BOTTLE CRITICAL RESULT CALLED TO, READ BACK BY AND VERIFIED WITH: T RUDISILL,PHARMD AT 0867 09/11/17 BY L BENFIELD Performed at [facility], [address] [place], [place] [address]    Culture STAPHYLOCOCCUS AUREUS (A)  Final   Report Status 09/13/2017 FINAL  Final   Organism ID, Bacteria STAPHYLOCOCCUS AUREUS  Final      Susceptibility   Staphylococcus aureus - MIC*    CIPROFLOXACIN <=0.5 SENSITIVE Sensitive     ERYTHROMYCIN <=0.25 SENSITIVE Sensitive     GENTAMICIN <=0.5 SENSITIVE Sensitive     OXACILLIN 0.5 SENSITIVE Sensitive     TETRACYCLINE <=1 SENSITIVE Sensitive     VANCOMYCIN 1 SENSITIVE Sensitive     TRIMETH/SULFA <=10 SENSITIVE Sensitive     CLINDAMYCIN <=0.25 SENSITIVE Sensitive     RIFAMPIN <=0.5 SENSITIVE Sensitive     Inducible Clindamycin NEGATIVE Sensitive     * STAPHYLOCOCCUS AUREUS  Blood Culture ID Panel (Reflexed)     Status: Abnormal   Collection Time: 09/10/17  7:45 PM  Result Value Ref Range Status   Enterococcus species NOT DETECTED NOT DETECTED Final   Vancomycin resistance NOT DETECTED NOT DETECTED Final   Listeria monocytogenes NOT DETECTED NOT DETECTED Final   Staphylococcus species DETECTED (A) NOT DETECTED Final    Comment: CRITICAL RESULT CALLED TO, READ BACK BY AND VERIFIED WITH: T RUDISILL,PHARMD AT 9326 09/11/17 BY L BENFIELD    Staphylococcus aureus DETECTED (A) NOT DETECTED Final    Comment: CRITICAL RESULT CALLED TO, READ BACK BY AND VERIFIED WITH: T  RUDISILL,PHARMD AT 7124 09/11/17 BY L BENFIELD Methicillin (oxacillin) susceptible Staphylococcus aureus (MSSA). Preferred therapy is anti staphylococcal beta lactam antibiotic (Cefazolin or Nafcillin), unless clinically contraindicated.    Methicillin resistance NOT DETECTED NOT DETECTED Final   Streptococcus species NOT DETECTED NOT DETECTED  Final   Streptococcus agalactiae NOT DETECTED NOT DETECTED Final   Streptococcus pneumoniae NOT DETECTED NOT DETECTED Final   Streptococcus pyogenes NOT DETECTED NOT DETECTED Final   Acinetobacter baumannii NOT DETECTED NOT DETECTED Final   Enterobacteriaceae species NOT DETECTED NOT DETECTED Final   Enterobacter cloacae complex NOT DETECTED NOT DETECTED Final   Escherichia coli NOT DETECTED NOT DETECTED Final   Klebsiella oxytoca NOT DETECTED NOT DETECTED Final   Klebsiella pneumoniae NOT DETECTED NOT DETECTED Final   Proteus species NOT DETECTED NOT DETECTED Final   Serratia marcescens NOT DETECTED NOT DETECTED Final   Carbapenem resistance NOT DETECTED NOT DETECTED Final   Haemophilus influenzae NOT DETECTED NOT DETECTED Final   Neisseria meningitidis NOT DETECTED NOT DETECTED Final   Pseudomonas aeruginosa NOT DETECTED NOT DETECTED Final   Candida albicans NOT DETECTED NOT DETECTED Final   Candida glabrata NOT DETECTED NOT DETECTED Final   Candida krusei NOT DETECTED NOT DETECTED Final   Candida parapsilosis NOT DETECTED NOT DETECTED Final   Candida tropicalis NOT DETECTED NOT DETECTED Final    Comment: Performed at [facility], [place] [address] [place], [place] [address]  Blood culture (routine x 2)     Status: Abnormal   Collection Time: 09/10/17  9:50 PM  Result Value Ref Range Status   Specimen Description BLOOD LEFT ANTECUBITAL  Final   Special Requests IN PEDIATRIC BOTTLE Blood Culture adequate volume  Final   Culture  Setup Time   Final    GRAM POSITIVE COCCI IN PEDIATRIC BOTTLE CRITICAL VALUE NOTED.  VALUE IS CONSISTENT WITH  PREVIOUSLY REPORTED AND CALLED VALUE.    Culture (A)  Final    STAPHYLOCOCCUS AUREUS SUSCEPTIBILITIES PERFORMED ON PREVIOUS CULTURE WITHIN THE LAST 5 DAYS. Performed at [facility], [place] [address] [place], [place] [address]    Report Status 09/13/2017 FINAL  Final  Culture, blood (routine x 2)     Status: None   Collection Time: 09/11/17  6:35 PM  Result Value Ref Range Status   Specimen Description BLOOD LEFT ANTECUBITAL  Final   Special Requests   Final    BOTTLES DRAWN AEROBIC AND ANAEROBIC Blood Culture adequate volume   Culture   Final    NO GROWTH 5 DAYS Performed at [facility], [place] [address] [place], [place] [address]    Report Status 09/16/2017 FINAL  Final  Culture, blood (routine x 2)     Status: Abnormal   Collection Time: 09/11/17  9:30 PM  Result Value Ref Range Status   Specimen Description BLOOD RIGHT HAND  Final   Special Requests IN PEDIATRIC BOTTLE Blood Culture adequate volume  Final   Culture  Setup Time   Final    GRAM POSITIVE COCCI IN CLUSTERS IN PEDIATRIC BOTTLE CRITICAL RESULT CALLED TO, READ BACK BY AND VERIFIED WITH: PHARMD B MARTIN [phone] MLM    Culture (A)  Final    STAPHYLOCOCCUS AUREUS SUSCEPTIBILITIES PERFORMED ON PREVIOUS CULTURE WITHIN THE LAST 5 DAYS. Performed at [facility], [place] [address] [place], [place] [address]    Report Status 09/14/2017 FINAL  Final  Blood Culture ID Panel (Reflexed)     Status: Abnormal   Collection Time: 09/11/17  9:30 PM  Result Value Ref Range Status   Enterococcus species NOT DETECTED NOT DETECTED Final   Vancomycin resistance NOT DETECTED NOT DETECTED Final   Listeria monocytogenes NOT DETECTED NOT DETECTED Final   Staphylococcus species DETECTED (A) NOT  DETECTED Final    Comment: CRITICAL RESULT CALLED TO, READ BACK BY AND VERIFIED WITH: T DANG PHARMD 1833 09/12/17 A BROWNING    Staphylococcus aureus DETECTED (A) NOT DETECTED Final    Comment: Methicillin (oxacillin)  susceptible Staphylococcus aureus (MSSA). Preferred therapy is anti staphylococcal beta lactam antibiotic (Cefazolin or Nafcillin), unless clinically contraindicated. CRITICAL RESULT CALLED TO, READ BACK BY AND VERIFIED WITH: T DANG PHARMD 3500 09/12/17 A BROWNING    Methicillin resistance NOT DETECTED NOT DETECTED Final   Streptococcus species NOT DETECTED NOT DETECTED Final   Streptococcus agalactiae NOT DETECTED NOT DETECTED Final   Streptococcus pneumoniae NOT DETECTED NOT DETECTED Final   Streptococcus pyogenes NOT DETECTED NOT DETECTED Final   Acinetobacter baumannii NOT DETECTED NOT DETECTED Final   Enterobacteriaceae species NOT DETECTED NOT DETECTED Final   Enterobacter cloacae complex NOT DETECTED NOT DETECTED Final   Escherichia coli NOT DETECTED NOT DETECTED Final   Klebsiella oxytoca NOT DETECTED NOT DETECTED Final   Klebsiella pneumoniae NOT DETECTED NOT DETECTED Final   Proteus species NOT DETECTED NOT DETECTED Final   Serratia marcescens NOT DETECTED NOT DETECTED Final   Carbapenem resistance NOT DETECTED NOT DETECTED Final   Haemophilus influenzae NOT DETECTED NOT DETECTED Final   Neisseria meningitidis NOT DETECTED NOT DETECTED Final   Pseudomonas aeruginosa NOT DETECTED NOT DETECTED Final   Candida albicans NOT DETECTED NOT DETECTED Final   Candida glabrata NOT DETECTED NOT DETECTED Final   Candida krusei NOT DETECTED NOT DETECTED Final   Candida parapsilosis NOT DETECTED NOT DETECTED Final   Candida tropicalis NOT DETECTED NOT DETECTED Final    Comment: Performed at [facility], [place]. [address], [place], [place] [address]  Culture, blood (Routine X 2) w Reflex to ID Panel     Status: Abnormal   Collection Time: 09/13/17  3:50 PM  Result Value Ref Range Status   Specimen Description BLOOD RIGHT HAND  Final   Special Requests IN PEDIATRIC BOTTLE Blood Culture adequate volume  Final   Culture  Setup Time   Final    GRAM POSITIVE COCCI IN PEDIATRIC  BOTTLE CRITICAL VALUE NOTED.  VALUE IS CONSISTENT WITH PREVIOUSLY REPORTED AND CALLED VALUE.    Culture (A)  Final    STAPHYLOCOCCUS AUREUS SUSCEPTIBILITIES PERFORMED ON PREVIOUS CULTURE WITHIN THE LAST 5 DAYS. Performed at [facility], [place] [address] [place], [place] [address]    Report Status 09/16/2017 FINAL  Final  Culture, blood (Routine X 2) w Reflex to ID Panel     Status: Abnormal   Collection Time: 09/13/17  4:00 PM  Result Value Ref Range Status   Specimen Description BLOOD RIGHT ARM  Final   Special Requests IN PEDIATRIC BOTTLE Blood Culture adequate volume  Final   Culture  Setup Time   Final    GRAM POSITIVE COCCI IN CLUSTERS IN PEDIATRIC BOTTLE CRITICAL RESULT CALLED TO, READ BACK BY AND VERIFIED WITH: Carlis Stable [phone] MLM Performed at [facility], [place] [address] [place], [place] [address]    Culture STAPHYLOCOCCUS AUREUS (A)  Final   Report Status 09/16/2017 FINAL  Final   Organism ID, Bacteria STAPHYLOCOCCUS AUREUS  Final      Susceptibility   Staphylococcus aureus - MIC*    CIPROFLOXACIN <=0.5 SENSITIVE Sensitive     ERYTHROMYCIN <=0.25 SENSITIVE Sensitive     GENTAMICIN <=0.5 SENSITIVE Sensitive     OXACILLIN <=0.25 SENSITIVE Sensitive     TETRACYCLINE <=1 SENSITIVE Sensitive  VANCOMYCIN <=0.5 SENSITIVE Sensitive     TRIMETH/SULFA <=10 SENSITIVE Sensitive     CLINDAMYCIN <=0.25 SENSITIVE Sensitive     RIFAMPIN <=0.5 SENSITIVE Sensitive     Inducible Clindamycin NEGATIVE Sensitive     * STAPHYLOCOCCUS AUREUS  Culture, blood (routine x 2)     Status: None (Preliminary result)   Collection Time: 09/16/17  9:44 AM  Result Value Ref Range Status   Specimen Description BLOOD RIGHT HAND  Final   Special Requests   Final    BOTTLES DRAWN AEROBIC AND ANAEROBIC Blood Culture results may not be optimal due to an inadequate volume of blood received in culture bottles   Culture   Final    NO GROWTH 4 DAYS Performed at [place], [place] [address] [place], [place] [address]    Report Status PENDING  Incomplete  Culture, blood (routine x 2)     Status: None (Preliminary result)   Collection Time: 09/16/17  9:52 AM  Result Value Ref Range Status   Specimen Description BLOOD LEFT HAND  Final   Special Requests   Final    BOTTLES DRAWN AEROBIC AND ANAEROBIC Blood Culture adequate volume   Culture   Final    NO GROWTH 4 DAYS Performed at [facility], [place] [address] [place], [place] [address]    Report Status PENDING  Incomplete         Radiology Studies:      Scheduled Meds: . buprenorphine-naloxone  1 tablet Sublingual BID  . enoxaparin (LOVENOX) injection  70 mg Subcutaneous BID  . famotidine  20 mg Oral Daily  . feeding supplement (ENSURE ENLIVE)  237 mL Oral BID BM  . ferrous sulfate  325 mg Oral BID WC  . nicotine  14 mg Transdermal Daily   Continuous Infusions: . sodium chloride 100 mL/hr at 09/20/17 0534  .  ceFAZolin (ANCEF) IV Stopped (09/20/17 0611)     LOS: 9 days    Time spent: 35 minutes.     Elmarie Shiley, MD [facility] Pager [phone]  If 7PM-7AM, please contact night-coverage [email or web address] Password [facility] 09/20/2017, 3:48 PM

## 2017-09-21 DIAGNOSIS — M71162 Other infective bursitis, left knee: Secondary | ICD-10-CM

## 2017-09-21 DIAGNOSIS — I8001 Phlebitis and thrombophlebitis of superficial vessels of right lower extremity: Secondary | ICD-10-CM

## 2017-09-21 DIAGNOSIS — M60009 Infective myositis, unspecified site: Secondary | ICD-10-CM

## 2017-09-21 LAB — BASIC METABOLIC PANEL
ANION GAP: 11 (ref 5–15)
BUN: 8 mg/dL (ref 6–20)
CO2: 24 mmol/L (ref 22–32)
Calcium: 8.2 mg/dL — ABNORMAL LOW (ref 8.9–10.3)
Chloride: 94 mmol/L — ABNORMAL LOW (ref 101–111)
Creatinine, Ser: 0.57 mg/dL — ABNORMAL LOW (ref 0.61–1.24)
Glucose, Bld: 96 mg/dL (ref 65–99)
Potassium: 4.3 mmol/L (ref 3.5–5.1)
SODIUM: 129 mmol/L — AB (ref 135–145)

## 2017-09-21 LAB — CULTURE, BLOOD (ROUTINE X 2)
CULTURE: NO GROWTH
Culture: NO GROWTH
Special Requests: ADEQUATE

## 2017-09-21 LAB — CBC
HCT: 31.2 % — ABNORMAL LOW (ref 39.0–52.0)
Hemoglobin: 10.3 g/dL — ABNORMAL LOW (ref 13.0–17.0)
MCH: 31.8 pg (ref 26.0–34.0)
MCHC: 33 g/dL (ref 30.0–36.0)
MCV: 96.3 fL (ref 78.0–100.0)
PLATELETS: 317 10*3/uL (ref 150–400)
RBC: 3.24 MIL/uL — ABNORMAL LOW (ref 4.22–5.81)
RDW: 13.3 % (ref 11.5–15.5)
WBC: 7 10*3/uL (ref 4.0–10.5)

## 2017-09-21 NOTE — Progress Notes (Signed)
PT Cancellation Note  Patient Details Name: Gary Hicks MRN: [id number] DOB: Dec 20, 1985   Cancelled Treatment:    Reason Eval/Treat Not Completed: Patient declined, no reason specified. Despite max encouragement, pt again refusing to participate in any mobility at this time. PT will continue to f/u with pt as available.    [place] 09/21/2017, 3:21 PM

## 2017-09-21 NOTE — Progress Notes (Signed)
   09/21/17 1600  Clinical Encounter Type  Visited With Patient and family together  Visit Type Initial  Referral From Chaplain  Consult/Referral To Chaplain  Spiritual Encounters  Spiritual Needs Emotional  Stress Factors  Patient Stress Factors Exhausted  Family Stress Factors Exhausted    This was pastoral care visit for a 32 year old male. Father of patient was on-site in great emotional distresses. Patients father had lots of concerns for the son /pt who needs lots of care concerning his diagnosis. I provided emotional support through reflective listening and compassionate presence  Danaja Lasota a [place], Chaplain `

## 2017-09-21 NOTE — Progress Notes (Signed)
[facility] for Infectious Disease  Date of Admission:  09/11/2017     Total days of antibiotics 8  Ancef 09/12/17  Dose of Oritavancin 09/15/17         Patient ID: Gary Hicks is a 32 y.o. male admitted with MSSA bacteremia and metastatic infection including:  Principal Problem:   Endocarditis of tricuspid valve Active Problems:   Bacteremia due to Staphylococcus aureus   Staphylococcal arthritis of right shoulder (HCC)   Septic infrapatellar bursitis of left knee   Staphylococcal arthritis of left hip (HCC)   Staphylococcal arthritis of right hip (HCC)   Pyomyositis   Saphenous vein thrombophlebitis, right   Heroin abuse (HCC)   Polysubstance abuse (HCC)   Severe sepsis (HCC)   Cigarette smoker   Hypokalemia   DIC (disseminated intravascular coagulation) ([place])   Calf abscess   Pain   . buprenorphine-naloxone  1 tablet Sublingual BID  . enoxaparin (LOVENOX) injection  70 mg Subcutaneous BID  . famotidine  20 mg Oral Daily  . feeding supplement (ENSURE ENLIVE)  237 mL Oral BID BM  . ferrous sulfate  325 mg Oral BID WC  . nicotine  14 mg Transdermal Daily    INTERVAL HX: Temperature max over the last 24h about 100 and have resolved without intervention. BCx from 2/15 without growth at 4 days now. Still with a lot of pain to left lateral anterior knee and right medial thigh. Poor appetite.   Allergies  Allergen Reactions  . Prednisone Hives  . Amoxicillin Hives    OBJECTIVE: Vitals:   09/20/17 1656 09/20/17 2113 09/21/17 0458 09/21/17 0859  BP: 121/76 118/86 112/80 122/74  Pulse: (!) 111 (!) 117 (!) 107 (!) 104  Resp: 16 17 17 16   Temp: 99.8 F (37.7 C) 100 F (37.8 C) 98.7 F (37.1 C) 99 F (37.2 C)  TempSrc: Oral Oral Oral Oral  SpO2: 99% 98% 97% 98%  Weight:  161 lb (73 kg)    Height:       Body mass index is 23.78 kg/m.  Physical Exam  Constitutional: He is oriented to person, place, and time.  Thin   HENT:  Mouth/Throat: Oropharynx is  clear and moist. No oral lesions. Normal dentition. No dental caries.  Eyes: No scleral icterus.  Neck: JVD present.  Cardiovascular: Regular rhythm and intact distal pulses. Tachycardia present.  Murmur heard. Pulmonary/Chest: Effort normal and breath sounds normal. No respiratory distress. He exhibits no tenderness.  Abdominal: Soft. He exhibits no distension. There is no tenderness.  Musculoskeletal:       Arms:      Legs: Lymphadenopathy:    He has no cervical adenopathy.  Neurological: He is alert and oriented to person, place, and time.  Skin: Skin is warm and dry. No rash noted.  Psychiatric: Mood and affect normal.  Vitals reviewed.   Lab Results Lab Results  Component Value Date   WBC 7.0 09/21/2017   HGB 10.3 (L) 09/21/2017   HCT 31.2 (L) 09/21/2017   MCV 96.3 09/21/2017   PLT 317 09/21/2017    Lab Results  Component Value Date   CREATININE 0.57 (L) 09/21/2017   BUN 8 09/21/2017   NA 129 (L) 09/21/2017   K 4.3 09/21/2017   CL 94 (L) 09/21/2017   CO2 24 09/21/2017    Lab Results  Component Value Date   ALT 46 09/14/2017   AST 97 (H) 09/14/2017   ALKPHOS 91 09/14/2017  BILITOT 0.7 09/14/2017     Microbiology: BCx 2/09 >> 2/2 MSSA  BCx 2/10>> 1/2 MSSA  BCx 2/12 >> 2/2 MSSA BCx 2/15 >> NG x 4d   TTE 2/11 >> large 1.1 cm x 1.8 cm  Vegetation and moderate to severe regurgitation   [place] R Leg 2/18 >>saphenous vein thrombosis in lower right thigh  MRI R Shoulder >> Mild supraspinatus and infraspinatus muscle edema suggestive of Myositis.  Mild subacromial/subdeltoid bursitis.  MR Left Knee 2/19 >> 4.8 x 0.8 x 4 cm fluid collection in subcutaneous fat anterior to patellar tendon with severe surrounding soft tissue edema concerning for septic bursitis.   MRI Head > neg for abscess   MRI C Spine > Motion degraded exam. C2-3 through C7-T1 no bulge, stenosis or foraminal narrowing. Mild C3-4 through C5-6 facet arthropathy. Early degenerative changes of the  cervical spine without canal stenosis or neural foraminal narrowing.   MRI Right Femur > pending   ASSESSMENT: 32 y.o. male with history of recent and recurrent IV drug use admitted with MSSA bacteremia and metastatic infection including tricuspid valve endocarditis, septic knee bursitis, septic thrombophlebitis of the saphenous vein and likely a deep ulnar vessel.   PLAN: 1. Continue Ancef   2. Left Knee MR noted - would recommend ortho eval debridement of septic bursitis/abscess of left knee 3. Hold off on PICC until BCx until plan for [place], MSN, NP-C [facility] for Infectious Reader Cell: [phone] Pager: [phone]  09/21/2017  11:06 AM

## 2017-09-21 NOTE — Progress Notes (Signed)
PROGRESS NOTE  Gary Hicks [name]:[id number] DOB: January 03, 1986 DOA: 09/11/2017 PCP: Patient, [name] Per  HPI/Recap of past 25 hours: 32 year old male, active IVDA, who presents with generalized pain. Patient was incarcerated and released 3-4 weeks ago. Patient admits using IV methamphetamine as well as heroin in the past 24 hours prior to admission. Reports global polyarthralgias including his bilateral feet bilateral knees bilateral hips and shoulders. Also reports fevers and chills generalized myalgias. Pt admitted for further management. 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 Pt was also diagnosed with DVT Right gastrocnemius vein . He was started on Lovenox 2/18.  Today, pt still c/o pain L knee and R medial thigh. Denies any chest pain, SOB, abdominal pain. Pt noted to be refusing MRI, requesting for more pain meds.    Assessment/Plan: Principal Problem:   Endocarditis of tricuspid valve Active Problems:   Heroin abuse (HCC)   Polysubstance abuse (HCC)   Severe sepsis ([place])   Bacteremia due to Staphylococcus aureus   Cigarette smoker   Hypokalemia   DIC (disseminated intravascular coagulation) ([place])   Staphylococcal arthritis of right shoulder (HCC)   Septic infrapatellar bursitis of left knee   Staphylococcal arthritis of left hip (HCC)   Staphylococcal arthritis of right hip (HCC)   Calf abscess   Pain   Pyomyositis   Saphenous vein thrombophlebitis, right  Tricuspid valve endocarditis with MSSA bacteremia Currently afebrile, no leukocytosis Blood culture 2/11 grew MSSA, repeat on 2/15 NGTD ECHO showed: Tricuspid valve: There was a large, 1.1 cm (W) x 1.8 cm (L), multilobulated, highly mobile vegetation on the right ventricular aspect of the anterior leaflet. 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 MRI Right shoulder: Mild supraspinatus and infraspinatus muscle edema suggestive of Myositis MRI brain negative for abscess MRI C spine: Motion degraded exam. C2-3 through C7-T1 no bulge, stenosis or foraminal narrowing. Mild C3-4 through C5-6 facet arthropathy MRI R femur: currently pending as pt refused Doppler left arm: Negative for DVT Plan to consult ortho for septic bursitis/abscess of L knee  IVDA  Dr Damita Dunnings managing Suboxone BID  No IV ativan unless required for seizure  Seizures Likely related to drug use Seizure precautions PRN Ativan MRI negative  Iron def anemia Continue PO iron Monitor hb on lovenox  DVT right Gastrocnemius vein;  Also superficial saphenous thrombophlebitis.  Continue Lovenox per pharmacy Monitor platelet and CBC  Thrombocytopenia Resolved  Hyponatremia IV fluids Daily BMP   Hepatitis c antibody positive     Code Status: Full  Family Communication: None at bedside  Disposition Plan: TBD   Consultants:  ID  Orthopedics, will reconsult in am  Procedures:  None  Antimicrobials:  IV Ancef  DVT prophylaxis:  Therapeutic lovenox   Objective: Vitals:   09/20/17 2113 09/21/17 0458 09/21/17 0859 09/21/17 1639  BP: 118/86 112/80 122/74 109/78  Pulse: (!) 117 (!) 107 (!) 104 (!) 105  Resp: 17 17 16 16   Temp: 100 F (37.8 C) 98.7 F (37.1 C) 99 F (37.2 C) 99.4 F (37.4 C)  TempSrc: Oral Oral Oral Oral  SpO2: 98% 97% 98% 100%  Weight: 73 kg (161 lb)     Height:  Intake/Output Summary (Last 24 hours) at 09/21/2017 1740 Last data filed at 09/21/2017 1530 Gross per 24 hour  Intake 4113.33 ml  Output 1000 ml  Net 3113.33 ml   Filed Weights   09/17/17 2040 09/19/17 2059 09/20/17 2113  Weight: 73 kg (160 lb 15 oz) 73 kg (160  lb 15 oz) 73 kg (161 lb)    Exam:   General:  NAD   Cardiovascular: S1, S2, RRR  Respiratory: Chest clear B/L   Abdomen: Soft, NT, ND, BS present  Musculoskeletal: L knee with mild edema   Skin: Rash noted  Psychiatry: Fair mood   Data Reviewed: CBC: Recent Labs  Lab 09/16/17 0633 09/18/17 0508 09/19/17 0603 09/20/17 0823 09/21/17 0847  WBC 12.4* 9.3 9.2 8.2 7.0  HGB 8.7* 8.4* 10.5* 9.9* 10.3*  HCT 25.6* 25.5* 31.8* 29.6* 31.2*  MCV 94.1 93.4 93.3 95.2 96.3  PLT 147* 229 200 331 099   Basic Metabolic Panel: Recent Labs  Lab 09/16/17 0633 09/18/17 0508 09/20/17 0823 09/21/17 0847  NA 132* 131* 132* 129*  K 4.2 4.0 4.0 4.3  CL 103 101 98* 94*  CO2 20* 20* 24 24  GLUCOSE 94 92 106* 96  BUN 9 9 7 8   CREATININE 0.84 0.69 0.69 0.57*  CALCIUM 7.5* 7.6* 8.1* 8.2*   GFR: Estimated Creatinine Clearance: 133.8 mL/min (A) (by C-G formula based on SCr of 0.57 mg/dL (L)). Liver Function Tests: No results for input(s): AST, ALT, ALKPHOS, BILITOT, PROT, ALBUMIN in the last 168 hours. No results for input(s): LIPASE, AMYLASE in the last 168 hours. No results for input(s): AMMONIA in the last 168 hours. Coagulation Profile: No results for input(s): INR, PROTIME in the last 168 hours. Cardiac Enzymes: No results for input(s): CKTOTAL, CKMB, CKMBINDEX, TROPONINI in the last 168 hours. BNP (last 3 results) No results for input(s): PROBNP in the last 8760 hours. HbA1C: No results for input(s): HGBA1C in the last 72 hours. CBG: No results for input(s): GLUCAP in the last 168 hours. Lipid Profile: No results for input(s): CHOL, HDL, LDLCALC, TRIG, CHOLHDL, LDLDIRECT in the last 72 hours. Thyroid Function Tests: No results for input(s): TSH, T4TOTAL, FREET4, T3FREE, THYROIDAB in the last 72 hours. Anemia Panel: No results for input(s): VITAMINB12, FOLATE, FERRITIN, TIBC, IRON, RETICCTPCT in the last 72 hours. Urine analysis:    Component Value Date/Time    COLORURINE YELLOW 09/10/2017 [place] 09/10/2017 1657   LABSPEC 1.019 09/10/2017 1657   PHURINE 6.0 09/10/2017 1657   GLUCOSEU NEGATIVE 09/10/2017 1657   HGBUR NEGATIVE 09/10/2017 1657   [place] 09/10/2017 1657   KETONESUR NEGATIVE 09/10/2017 1657   PROTEINUR NEGATIVE 09/10/2017 1657   UROBILINOGEN 0.2 05/16/2012 0847   NITRITE NEGATIVE 09/10/2017 1657   LEUKOCYTESUR NEGATIVE 09/10/2017 1657   Sepsis Labs: @LABRCNTIP (procalcitonin:4,lacticidven:4)  ) Recent Results (from the past 240 hour(s))  Culture, blood (routine x 2)     Status: None   Collection Time: 09/11/17  6:35 PM  Result Value Ref Range Status   Specimen Description BLOOD LEFT ANTECUBITAL  Final   Special Requests   Final    BOTTLES DRAWN AEROBIC AND ANAEROBIC Blood Culture adequate volume   Culture   Final    NO GROWTH 5 DAYS Performed at [facility], [place] [address] [place], [place] [address]    Report Status 09/16/2017 FINAL  Final  Culture, blood (routine x 2)     Status: Abnormal   Collection Time: 09/11/17  9:30  PM  Result Value Ref Range Status   Specimen Description BLOOD RIGHT HAND  Final   Special Requests IN PEDIATRIC BOTTLE Blood Culture adequate volume  Final   Culture  Setup Time   Final    GRAM POSITIVE COCCI IN CLUSTERS IN PEDIATRIC BOTTLE CRITICAL RESULT CALLED TO, READ BACK BY AND VERIFIED WITH: PHARMD B MARTIN [id number] MLM    Culture (A)  Final    STAPHYLOCOCCUS AUREUS SUSCEPTIBILITIES PERFORMED ON PREVIOUS CULTURE WITHIN THE LAST 5 DAYS. Performed at [facility], [place] [address], [place], [place] [address]    Report Status 09/14/2017 FINAL  Final  Blood Culture ID Panel (Reflexed)     Status: Abnormal   Collection Time: 09/11/17  9:30 PM  Result Value Ref Range Status   Enterococcus species NOT DETECTED NOT DETECTED Final   Vancomycin resistance NOT DETECTED NOT DETECTED Final   Listeria monocytogenes NOT DETECTED NOT DETECTED Final    Staphylococcus species DETECTED (A) NOT DETECTED Final    Comment: CRITICAL RESULT CALLED TO, READ BACK BY AND VERIFIED WITH: T DANG PHARMD 1833 09/12/17 A BROWNING    Staphylococcus aureus DETECTED (A) NOT DETECTED Final    Comment: Methicillin (oxacillin) susceptible Staphylococcus aureus (MSSA). Preferred therapy is anti staphylococcal beta lactam antibiotic (Cefazolin or Nafcillin), unless clinically contraindicated. CRITICAL RESULT CALLED TO, READ BACK BY AND VERIFIED WITH: T DANG PHARMD 2025 09/12/17 A BROWNING    Methicillin resistance NOT DETECTED NOT DETECTED Final   Streptococcus species NOT DETECTED NOT DETECTED Final   Streptococcus agalactiae NOT DETECTED NOT DETECTED Final   Streptococcus pneumoniae NOT DETECTED NOT DETECTED Final   Streptococcus pyogenes NOT DETECTED NOT DETECTED Final   Acinetobacter baumannii NOT DETECTED NOT DETECTED Final   Enterobacteriaceae species NOT DETECTED NOT DETECTED Final   Enterobacter cloacae complex NOT DETECTED NOT DETECTED Final   Escherichia coli NOT DETECTED NOT DETECTED Final   Klebsiella oxytoca NOT DETECTED NOT DETECTED Final   Klebsiella pneumoniae NOT DETECTED NOT DETECTED Final   Proteus species NOT DETECTED NOT DETECTED Final   Serratia marcescens NOT DETECTED NOT DETECTED Final   Carbapenem resistance NOT DETECTED NOT DETECTED Final   Haemophilus influenzae NOT DETECTED NOT DETECTED Final   Neisseria meningitidis NOT DETECTED NOT DETECTED Final   Pseudomonas aeruginosa NOT DETECTED NOT DETECTED Final   Candida albicans NOT DETECTED NOT DETECTED Final   Candida glabrata NOT DETECTED NOT DETECTED Final   Candida krusei NOT DETECTED NOT DETECTED Final   Candida parapsilosis NOT DETECTED NOT DETECTED Final   Candida tropicalis NOT DETECTED NOT DETECTED Final    Comment: Performed at [facility], [place]. [address] [place], [place] [address]  Culture, blood (Routine X 2) w Reflex to ID Panel     Status: Abnormal   Collection  Time: 09/13/17  3:50 PM  Result Value Ref Range Status   Specimen Description BLOOD RIGHT HAND  Final   Special Requests IN PEDIATRIC BOTTLE Blood Culture adequate volume  Final   Culture  Setup Time   Final    GRAM POSITIVE COCCI IN PEDIATRIC BOTTLE CRITICAL VALUE NOTED.  VALUE IS CONSISTENT WITH PREVIOUSLY REPORTED AND CALLED VALUE.    Culture (A)  Final    STAPHYLOCOCCUS AUREUS SUSCEPTIBILITIES PERFORMED ON PREVIOUS CULTURE WITHIN THE LAST 5 DAYS. Performed at [facility], [place] [address], [place], [place] [address]    Report Status 09/16/2017 FINAL  Final  Culture, blood (Routine X 2) w Reflex to ID  Panel     Status: Abnormal   Collection Time: 09/13/17  4:00 PM  Result Value Ref Range Status   Specimen Description BLOOD RIGHT ARM  Final   Special Requests IN PEDIATRIC BOTTLE Blood Culture adequate volume  Final   Culture  Setup Time   Final    GRAM POSITIVE COCCI IN CLUSTERS IN PEDIATRIC BOTTLE CRITICAL RESULT CALLED TO, READ BACK BY AND VERIFIED WITH: Carlis Stable [id number] Performed at [facility], [place] [address], [place], [place] [address]    Culture STAPHYLOCOCCUS AUREUS (A)  Final   Report Status 09/16/2017 FINAL  Final   Organism ID, Bacteria STAPHYLOCOCCUS AUREUS  Final      Susceptibility   Staphylococcus aureus - MIC*    CIPROFLOXACIN <=0.5 SENSITIVE Sensitive     ERYTHROMYCIN <=0.25 SENSITIVE Sensitive     GENTAMICIN <=0.5 SENSITIVE Sensitive     OXACILLIN <=0.25 SENSITIVE Sensitive     TETRACYCLINE <=1 SENSITIVE Sensitive     VANCOMYCIN <=0.5 SENSITIVE Sensitive     TRIMETH/SULFA <=10 SENSITIVE Sensitive     CLINDAMYCIN <=0.25 SENSITIVE Sensitive     RIFAMPIN <=0.5 SENSITIVE Sensitive     Inducible Clindamycin NEGATIVE Sensitive     * STAPHYLOCOCCUS AUREUS  Culture, blood (routine x 2)     Status: None   Collection Time: 09/16/17  9:44 AM  Result Value Ref Range Status   Specimen Description BLOOD RIGHT HAND  Final   Special Requests    Final    BOTTLES DRAWN AEROBIC AND ANAEROBIC Blood Culture results may not be optimal due to an inadequate volume of blood received in culture bottles   Culture   Final    NO GROWTH 5 DAYS Performed at [facility], [place] [address], [place], [place] [address]    Report Status 09/21/2017 FINAL  Final  Culture, blood (routine x 2)     Status: None   Collection Time: 09/16/17  9:52 AM  Result Value Ref Range Status   Specimen Description BLOOD LEFT HAND  Final   Special Requests   Final    BOTTLES DRAWN AEROBIC AND ANAEROBIC Blood Culture adequate volume   Culture   Final    NO GROWTH 5 DAYS Performed at [facility], [place] [address] [place],  [address]    Report Status 09/21/2017 FINAL  Final      Studies: Mr Cervical Spine Wo Contrast  Result Date: 09/20/2017 CLINICAL DATA:  Generalized pain, hand numbness and weakness. History of endocarditis and intravenous drug abuse. EXAM: MRI CERVICAL SPINE WITHOUT CONTRAST TECHNIQUE: Multiplanar, multisequence MR imaging of the cervical spine was performed. No intravenous contrast was administered. COMPARISON:  MRI of the head September 18, 2017 FINDINGS: ALIGNMENT: Straightened cervical lordosis.  No malalignment. VERTEBRAE/DISCS: Vertebral bodies are intact. Intervertebral disc morphology's and signal are normal. Mild C6-7 ventral endplate spurring. No abnormal or acute bone marrow signal. CORD:Cervical spinal cord is normal morphology and signal characteristics from the cervicomedullary junction to level of T1-2, the most caudal well visualized level. POSTERIOR FOSSA, VERTEBRAL ARTERIES, PARASPINAL TISSUES: No MR findings of ligamentous injury. Vertebral artery flow voids present. Included posterior fossa and paraspinal soft tissues are normal. 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 DISC LEVELS (axial sequences mild to moderately motion degraded): C2-3 through C7-T1: No disc bulge, canal stenosis nor neural  foraminal narrowing. Mild C3-4 through C5-6 facet arthropathy. IMPRESSION: 1. Motion degraded examination. 2. No fracture, malalignment or acute osseous  process. No MR findings of infection by noncontrast MRI. 3. Early degenerative change of the cervical spine without canal stenosis or neural foraminal narrowing. Electronically Signed   By: Elon Alas M.D.   On: 09/20/2017 23:16   Mr Knee Left Wo Contrast  Result Date: 09/21/2017 CLINICAL DATA:  32 year old man with active IV drug abuse, who presents with generalized pain. History is limited secondary to his acute illness including pain and fluctuations in alertness EXAM: MRI OF THE LEFT KNEE WITHOUT CONTRAST TECHNIQUE: Multiplanar, multisequence MR imaging of the knee was performed. No intravenous contrast was administered. COMPARISON:  None. FINDINGS: MENISCI Medial meniscus:  Intact. Lateral meniscus:  Intact. LIGAMENTS Cruciates:  Intact ACL and PCL. Collaterals: Medial collateral ligament is intact. Lateral collateral ligament complex is intact. CARTILAGE Patellofemoral:  No chondral defect. Medial:  No chondral defect. Lateral:  No chondral defect. Joint: No joint effusion. Normal Hoffa's fat. No plical thickening. Popliteal Fossa: No Baker's cyst. Intact popliteus tendon. Mild soft tissue edema superficial to the lateral gastrocnemius muscle. Mild soft tissue edema deep to the distal biceps femoris muscle. Extensor Mechanism: Intact quadriceps tendon. Intact patellar tendon. Intact medial patellar retinaculum. Intact lateral patellar retinaculum. Intact MPFL. Bones: No focal marrow signal abnormality. No fracture or dislocation. Other: 4.8 x 0.8 x 4 cm complex fluid collection in the subcutaneous fat anterior to the patellar tendon with severe surrounding soft tissue edema most concerning for bursitis which may be secondarily infected. IMPRESSION: 1. 4.8 x 0.8 x 4 cm complex fluid collection in the subcutaneous fat anterior to the patellar tendon  with severe surrounding soft tissue edema most concerning for bursitis which may be secondarily infected. 2. No evidence of septic arthritis. Electronically Signed   By: Kathreen Devoid   On: 09/21/2017 09:02    Scheduled Meds: . buprenorphine-naloxone  1 tablet Sublingual BID  . enoxaparin (LOVENOX) injection  70 mg Subcutaneous BID  . famotidine  20 mg Oral Daily  . feeding supplement (ENSURE ENLIVE)  237 mL Oral BID BM  . ferrous sulfate  325 mg Oral BID WC  . nicotine  14 mg Transdermal Daily    Continuous Infusions: . sodium chloride 100 mL/hr at 09/21/17 0755  .  ceFAZolin (ANCEF) IV Stopped (09/21/17 1530)     LOS: 10 days     Alma Friendly, MD [facility]   If 7PM-7AM, please contact night-coverage [email or web address] Password [facility] 09/21/2017, 5:40 PM

## 2017-09-21 NOTE — Progress Notes (Signed)
PT Cancellation Note  Patient Details Name: Gary Hicks MRN: [id number] DOB: Feb 26, 1986   Cancelled Treatment:    Reason Eval/Treat Not Completed: Patient declined, no reason specified. Pt refusing to participate in treatment session at this time secondary to increased pain in his neck. PT will continue to f/u with pt as available.   [name] 09/21/2017, 12:05 PM

## 2017-09-22 LAB — CBC WITH DIFFERENTIAL/PLATELET
BASOS ABS: 0 10*3/uL (ref 0.0–0.1)
Basophils Relative: 1 %
Eosinophils Absolute: 0.1 10*3/uL (ref 0.0–0.7)
Eosinophils Relative: 1 %
HCT: 32.6 % — ABNORMAL LOW (ref 39.0–52.0)
HEMOGLOBIN: 10.8 g/dL — AB (ref 13.0–17.0)
LYMPHS PCT: 29 %
Lymphs Abs: 1.7 10*3/uL (ref 0.7–4.0)
MCH: 31.4 pg (ref 26.0–34.0)
MCHC: 33.1 g/dL (ref 30.0–36.0)
MCV: 94.8 fL (ref 78.0–100.0)
Monocytes Absolute: 0.6 10*3/uL (ref 0.1–1.0)
Monocytes Relative: 11 %
Neutro Abs: 3.4 10*3/uL (ref 1.7–7.7)
Neutrophils Relative %: 58 %
Platelets: 326 10*3/uL (ref 150–400)
RBC: 3.44 MIL/uL — AB (ref 4.22–5.81)
RDW: 13.1 % (ref 11.5–15.5)
WBC: 5.9 10*3/uL (ref 4.0–10.5)

## 2017-09-22 LAB — BASIC METABOLIC PANEL
ANION GAP: 10 (ref 5–15)
BUN: 9 mg/dL (ref 6–20)
CO2: 23 mmol/L (ref 22–32)
Calcium: 8.2 mg/dL — ABNORMAL LOW (ref 8.9–10.3)
Chloride: 96 mmol/L — ABNORMAL LOW (ref 101–111)
Creatinine, Ser: 0.57 mg/dL — ABNORMAL LOW (ref 0.61–1.24)
GFR calc Af Amer: >60 mL/min (ref >60)
GLUCOSE: 92 mg/dL (ref 65–99)
POTASSIUM: 4.5 mmol/L (ref 3.5–5.1)
Sodium: 129 mmol/L — ABNORMAL LOW (ref 135–145)

## 2017-09-22 MED ORDER — GABAPENTIN 300 MG PO CAPS
300.0000 mg | ORAL_CAPSULE | Freq: Once | ORAL | Status: AC
  Administered 2017-09-22: 300 mg via ORAL
  Filled 2017-09-22: qty 1

## 2017-09-22 MED ORDER — CHLORHEXIDINE GLUCONATE 4 % EX LIQD
60.0000 mL | Freq: Once | CUTANEOUS | Status: AC
  Administered 2017-09-23: 4 via TOPICAL
  Filled 2017-09-22: qty 60

## 2017-09-22 MED ORDER — CHLORHEXIDINE GLUCONATE 4 % EX LIQD
60.0000 mL | Freq: Once | CUTANEOUS | Status: DC
  Filled 2017-09-22: qty 60

## 2017-09-22 MED ORDER — LACTATED RINGERS IV SOLN: INTRAVENOUS | Status: DC

## 2017-09-22 MED ORDER — HYDROXYZINE HCL 25 MG PO TABS
25.0000 mg | ORAL_TABLET | Freq: Three times a day (TID) | ORAL | Status: DC | PRN
  Administered 2017-09-22 – 2017-09-28 (×5): 25 mg via ORAL
  Filled 2017-09-22 (×5): qty 1

## 2017-09-22 MED ORDER — CEFAZOLIN SODIUM-DEXTROSE 2-4 GM/100ML-% IV SOLN
2.0000 g | INTRAVENOUS | Status: AC
  Administered 2017-09-23: 2 g via INTRAVENOUS
  Filled 2017-09-22: qty 100

## 2017-09-22 MED ORDER — POVIDONE-IODINE 10 % EX SWAB: 2.0000 "application " | Freq: Once | CUTANEOUS | Status: DC

## 2017-09-22 MED ORDER — ACETAMINOPHEN 500 MG PO TABS
1000.0000 mg | ORAL_TABLET | Freq: Once | ORAL | Status: AC
  Administered 2017-09-22: 1000 mg via ORAL
  Filled 2017-09-22: qty 2

## 2017-09-22 MED ORDER — CEFAZOLIN SODIUM-DEXTROSE 2-4 GM/100ML-% IV SOLN: 2.0000 g | INTRAVENOUS | Status: DC

## 2017-09-22 MED ORDER — CLONAZEPAM 0.25 MG PO TBDP
0.2500 mg | ORAL_TABLET | Freq: Two times a day (BID) | ORAL | Status: DC | PRN
  Administered 2017-09-22 – 2017-10-03 (×20): 0.25 mg via ORAL
  Filled 2017-09-22 (×22): qty 1

## 2017-09-22 NOTE — Progress Notes (Signed)
Physical Therapy Treatment Patient Details Name: Gary Hicks MRN: [id number] DOB: November 10, 1985 Today's Date: 09/22/2017    History of Present Illness Pt is a 32 y/o male with active IV drug use presenting with generalized pain. Family is concerned about seizures due to finding him in fetal position with abnormal eye movements. He reports global polyarthralgia, use of heroin and methamphetamines in the past 24 hours at time of admission. Diagnosed with staph bacteremia, severe sepsis. PMH bacterial endocarditis, heroin abuse, possible Hepatitis C, opiate abuse, polysubstance abuse, seizures.  Pt also with positive right LE DVT on 2/18.  On Lovenox.      PT Comments    Pt only agreeable to briefly long sit in bed, despite max encouragement from therapist for OOB mobility. Pt reported increased pain in bilateral knees and bilateral shoulders. Pt continues to be limited with functional mobility secondary to pain and self-limiting. PT updating recommendations based on pt's current functional mobility status. Pt would continue to benefit from skilled physical therapy services at this time while admitted and after d/c to address the below listed limitations in order to improve overall safety and independence with functional mobility.    Follow Up Recommendations  SNF;Other (comment)(pending further mobility progress)     Equipment Recommendations  Rolling walker with 5" wheels    Recommendations for Other Services       Precautions / Restrictions Precautions Precautions: Fall Restrictions Weight Bearing Restrictions: No    Mobility  Bed Mobility Overal bed mobility: Needs Assistance Bed Mobility: Supine to Sit;Sit to Supine     Supine to sit: Supervision Sit to supine: Supervision   General bed mobility comments: supervision to achieve a modified long sitting position; pt not willing to sit EOB this session  Transfers                 General transfer comment: pt  refusing  Ambulation/Gait                 Stairs            Wheelchair Mobility    Modified Rankin (Stroke Patients Only)       Balance Overall balance assessment: Needs assistance Sitting-balance support: Feet supported;Bilateral upper extremity supported Sitting balance-Leahy Scale: Poor                                      Cognition Arousal/Alertness: Awake/alert Behavior During Therapy: WFL for tasks assessed/performed Overall Cognitive Status: Within Functional Limits for tasks assessed                                        Exercises Other Exercises Other Exercises: pt verbally reviewed LE HEP given by previous therapist including quad sets, heel slides and straight leg raises. Therapist demonstrated and instructed pt in glute bridges as well as finger flexion/extension as pt with mild edema noted in fingers on L hand    General Comments        Pertinent Vitals/Pain Pain Assessment: Faces Faces Pain Scale: Hurts even more Pain Location: bilateral knees and bilateral shoulders Pain Descriptors / Indicators: Grimacing;Guarding Pain Intervention(s): Monitored during session;Repositioned;Ice applied    Home Living                      Prior Function  PT Goals (current goals can now be found in the care plan section) Acute Rehab PT Goals PT Goal Formulation: With patient/family Time For Goal Achievement: 10/01/17 Potential to Achieve Goals: Good Progress towards PT goals: Not progressing toward goals - comment(self-limiting, limited secondary to pain)    Frequency    Min 3X/week      PT Plan Discharge plan needs to be updated    Co-evaluation              AM-PAC PT "6 Clicks" Daily Activity  Outcome Measure  Difficulty turning over in bed (including adjusting bedclothes, sheets and blankets)?: Unable Difficulty moving from lying on back to sitting on the side of the bed? :  Unable Difficulty sitting down on and standing up from a chair with arms (e.g., wheelchair, bedside commode, etc,.)?: Unable Help needed moving to and from a bed to chair (including a wheelchair)?: Total Help needed walking in hospital room?: Total Help needed climbing 3-5 steps with a railing? : Total 6 Click Score: 6    End of Session   Activity Tolerance: Patient limited by pain;Patient limited by fatigue;Other (comment)(self-limiting) Patient left: in bed;with call bell/phone within reach Nurse Communication: Mobility status PT Visit Diagnosis: Muscle weakness (generalized) (M62.81);Difficulty in walking, not elsewhere classified (R26.2);Pain Pain - Right/Left: (bilateral) Pain - part of body: Knee;Shoulder     Time: 3491-7915 PT Time Calculation (min) (ACUTE ONLY): 21 min  Charges:  $Self Care/Home Management: 04/23/2023                    G Codes:       Sherie Don, PT, DPT [phone]    [place] 09/22/2017, 4:25 PM

## 2017-09-22 NOTE — Progress Notes (Signed)
PROGRESS NOTE  Gary Hicks GBT:[id number] DOB: [birth year] DOA: 09/11/2017 PCP: Patient, [name] Per  HPI/Recap of past 40 hours: 32 year old male, active IVDA, who presents with generalized pain. Patient was incarcerated and released 3-4 weeks ago. Patient admits using IV methamphetamine as well as heroin in the past 24 hours prior to admission. Reports global polyarthralgias including his bilateral feet bilateral knees bilateral hips and shoulders. Also reports fevers and chills generalized myalgias. Pt admitted for further management. 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 Pt was also diagnosed with DVT Right gastrocnemius vein . He was started on Lovenox 2/18.  Today, pt still c/o L knee pain, thigh pain. Denies any chest pain, SOB, fever/chills. Reported refused MRI due to ??pain. Educated on the need for MRI. Ortho consulted, for I&D on 09/23/17.    Assessment/Plan: Principal Problem:   Endocarditis of tricuspid valve Active Problems:   Heroin abuse (HCC)   Polysubstance abuse (HCC)   Severe sepsis ([place])   Bacteremia due to Staphylococcus aureus   Cigarette smoker   Hypokalemia   DIC (disseminated intravascular coagulation) ([place])   Staphylococcal arthritis of right shoulder (HCC)   Septic infrapatellar bursitis of left knee   Staphylococcal arthritis of left hip (HCC)   Staphylococcal arthritis of right hip (HCC)   Calf abscess   Pain   Pyomyositis   Saphenous vein thrombophlebitis, right  Tricuspid valve endocarditis with MSSA bacteremia Currently afebrile, no leukocytosis Blood culture 2/11 grew MSSA, repeat on 2/15 NGTD ECHO showed: Tricuspid valve: There was a large, 1.1 cm (W) x 1.8 cm (L), multilobulated, highly mobile vegetation on the right ventricular aspect of the anterior leaflet. 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 MRI Right shoulder: Mild supraspinatus and infraspinatus muscle edema suggestive of Myositis MRI brain negative for abscess MRI C spine: Motion degraded exam. C2-3 through C7-T1 no bulge, stenosis or foraminal narrowing. Mild C3-4 through C5-6 facet arthropathy MRI R femur: currently pending as pt refused Doppler left arm: Negative for DVT Orthopedic consulted for septic bursitis/abscess of L knee, I&D scheduled for 09/23/17  IVDA  Dr Damita Dunnings managing Suboxone BID  No IV ativan unless required for seizure  Seizures Likely related to drug use Seizure precautions PRN Ativan MRI negative  Iron def anemia Continue PO iron Monitor hb on lovenox  DVT right Gastrocnemius vein;  Also superficial saphenous thrombophlebitis.  Continue Lovenox per pharmacy Monitor platelet and CBC  Thrombocytopenia Resolved  Hyponatremia IV fluids Daily BMP   Hepatitis c antibody positive ID on board     Code Status: Full  Family Communication: None at bedside  Disposition Plan: TBD   Consultants:  ID  Orthopedics  Procedures:  None  Antimicrobials:  IV Ancef  DVT prophylaxis:  Therapeutic lovenox   Objective: Vitals:   09/21/17 0859 09/21/17 1639 09/21/17 2056 09/22/17 0957  BP: 122/74 109/78 104/73 105/71  Pulse: (!) 104 (!) 105 (!) 103 (!) 109  Resp: 16 16 17 17   Temp: 99 F (37.2 C) 99.4 F (37.4 C) 98.8 F (37.1 C) 98.7 F (37.1 C)  TempSrc: Oral Oral Oral Oral  SpO2: 98% 100% 100% 98%  Weight:   73.1 kg (161  lb 1.1 oz)   Height:        Intake/Output Summary (Last 24 hours) at 09/22/2017 1609 Last data filed at 09/22/2017 1000 Gross per 24 hour  Intake 2392.08 ml  Output 1475 ml  Net 917.08 ml   Filed Weights   09/19/17 2059 09/20/17 2113  09/21/17 2056  Weight: 73 kg (160 lb 15 oz) 73 kg (161 lb) 73.1 kg (161 lb 1.1 oz)    Exam:   General:  NAD   Cardiovascular: S1, S2, RRR  Respiratory: Chest clear B/L   Abdomen: Soft, NT, ND, BS present  Musculoskeletal: L knee with mild edema, pain   Skin: Rash noted  Psychiatry: Fair mood, constantly wants pain med   Data Reviewed: CBC: Recent Labs  Lab 09/18/17 0508 09/19/17 0603 09/20/17 0823 09/21/17 0847 09/22/17 0426  WBC 9.3 9.2 8.2 7.0 5.9  NEUTROABS  --   --   --   --  3.4  HGB 8.4* 10.5* 9.9* 10.3* 10.8*  HCT 25.5* 31.8* 29.6* 31.2* 32.6*  MCV 93.4 93.3 95.2 96.3 94.8  PLT 229 200 331 317 970   Basic Metabolic Panel: Recent Labs  Lab 09/16/17 0633 09/18/17 0508 09/20/17 0823 09/21/17 0847 09/22/17 0426  NA 132* 131* 132* 129* 129*  K 4.2 4.0 4.0 4.3 4.5  CL 103 101 98* 94* 96*  CO2 20* 20* 24 24 23   GLUCOSE 94 92 106* 96 92  BUN 9 9 7 8 9   CREATININE 0.84 0.69 0.69 0.57* 0.57*  CALCIUM 7.5* 7.6* 8.1* 8.2* 8.2*   GFR: Estimated Creatinine Clearance: 133.8 mL/min (A) (by C-G formula based on SCr of 0.57 mg/dL (L)). Liver Function Tests: No results for input(s): AST, ALT, ALKPHOS, BILITOT, PROT, ALBUMIN in the last 168 hours. No results for input(s): LIPASE, AMYLASE in the last 168 hours. No results for input(s): AMMONIA in the last 168 hours. Coagulation Profile: No results for input(s): INR, PROTIME in the last 168 hours. Cardiac Enzymes: No results for input(s): CKTOTAL, CKMB, CKMBINDEX, TROPONINI in the last 168 hours. BNP (last 3 results) No results for input(s): PROBNP in the last 8760 hours. HbA1C: No results for input(s): HGBA1C in the last 72 hours. CBG: No results for input(s): GLUCAP in the last 168 hours. Lipid Profile: No results for input(s): CHOL, HDL, LDLCALC, TRIG, CHOLHDL, LDLDIRECT in the last 72 hours. Thyroid Function Tests: No results for input(s): TSH, T4TOTAL, FREET4, T3FREE, THYROIDAB in the last 72  hours. Anemia Panel: No results for input(s): VITAMINB12, FOLATE, FERRITIN, TIBC, IRON, RETICCTPCT in the last 72 hours. Urine analysis:    Component Value Date/Time   COLORURINE YELLOW 09/10/2017 [place] 09/10/2017 1657   LABSPEC 1.019 09/10/2017 1657   PHURINE 6.0 09/10/2017 1657   GLUCOSEU NEGATIVE 09/10/2017 1657   HGBUR NEGATIVE 09/10/2017 1657   [place] 09/10/2017 1657   KETONESUR NEGATIVE 09/10/2017 1657   PROTEINUR NEGATIVE 09/10/2017 1657   UROBILINOGEN 0.2 05/16/2012 0847   NITRITE NEGATIVE 09/10/2017 1657   LEUKOCYTESUR NEGATIVE 09/10/2017 1657   Sepsis Labs: @LABRCNTIP (procalcitonin:4,lacticidven:4)  ) Recent Results (from the past 240 hour(s))  Culture, blood (Routine X 2) w Reflex to ID Panel     Status: Abnormal   Collection Time: 09/13/17  3:50 PM  Result Value Ref Range Status   Specimen Description BLOOD RIGHT HAND  Final   Special Requests IN PEDIATRIC BOTTLE Blood Culture adequate volume  Final   Culture  Setup Time   Final  GRAM POSITIVE COCCI IN PEDIATRIC BOTTLE CRITICAL VALUE NOTED.  VALUE IS CONSISTENT WITH PREVIOUSLY REPORTED AND CALLED VALUE.    Culture (A)  Final    STAPHYLOCOCCUS AUREUS SUSCEPTIBILITIES PERFORMED ON PREVIOUS CULTURE WITHIN THE LAST 5 DAYS. Performed at [facility], [place] [address] [place], [place] [address]    Report Status 09/16/2017 FINAL  Final  Culture, blood (Routine X 2) w Reflex to ID Panel     Status: Abnormal   Collection Time: 09/13/17  4:00 PM  Result Value Ref Range Status   Specimen Description BLOOD RIGHT ARM  Final   Special Requests IN PEDIATRIC BOTTLE Blood Culture adequate volume  Final   Culture  Setup Time   Final    GRAM POSITIVE COCCI IN CLUSTERS IN PEDIATRIC BOTTLE CRITICAL RESULT CALLED TO, READ BACK BY AND VERIFIED WITH: Carlis Stable [id number] MLM Performed at [facility], [place] [address] [place], [place] [address]    Culture STAPHYLOCOCCUS  AUREUS (A)  Final   Report Status 09/16/2017 FINAL  Final   Organism ID, Bacteria STAPHYLOCOCCUS AUREUS  Final      Susceptibility   Staphylococcus aureus - MIC*    CIPROFLOXACIN <=0.5 SENSITIVE Sensitive     ERYTHROMYCIN <=0.25 SENSITIVE Sensitive     GENTAMICIN <=0.5 SENSITIVE Sensitive     OXACILLIN <=0.25 SENSITIVE Sensitive     TETRACYCLINE <=1 SENSITIVE Sensitive     VANCOMYCIN <=0.5 SENSITIVE Sensitive     TRIMETH/SULFA <=10 SENSITIVE Sensitive     CLINDAMYCIN <=0.25 SENSITIVE Sensitive     RIFAMPIN <=0.5 SENSITIVE Sensitive     Inducible Clindamycin NEGATIVE Sensitive     * STAPHYLOCOCCUS AUREUS  Culture, blood (routine x 2)     Status: None   Collection Time: 09/16/17  9:44 AM  Result Value Ref Range Status   Specimen Description BLOOD RIGHT HAND  Final   Special Requests   Final    BOTTLES DRAWN AEROBIC AND ANAEROBIC Blood Culture results may not be optimal due to an inadequate volume of blood received in culture bottles   Culture   Final    NO GROWTH 5 DAYS Performed at [facility], [place] [address] [place], [place] [address]    Report Status 09/21/2017 FINAL  Final  Culture, blood (routine x 2)     Status: None   Collection Time: 09/16/17  9:52 AM  Result Value Ref Range Status   Specimen Description BLOOD LEFT HAND  Final   Special Requests   Final    BOTTLES DRAWN AEROBIC AND ANAEROBIC Blood Culture adequate volume   Culture   Final    NO GROWTH 5 DAYS Performed at [facility], [place] [address] [place], [place] [address]    Report Status 09/21/2017 FINAL  Final      Studies: No results found.  Scheduled Meds: . buprenorphine-naloxone  1 tablet Sublingual BID  . enoxaparin (LOVENOX) injection  70 mg Subcutaneous BID  . famotidine  20 mg Oral Daily  . feeding supplement (ENSURE ENLIVE)  237 mL Oral BID BM  . ferrous sulfate  325 mg Oral BID WC  . nicotine  14 mg Transdermal Daily    Continuous Infusions: . sodium chloride 75 mL/hr at  09/22/17 0517  .  ceFAZolin (ANCEF) IV Stopped (09/22/17 1440)     LOS: 11 days     Alma Friendly, MD [facility]   If 7PM-7AM, please contact night-coverage [email or web address] Password [facility] 09/22/2017,  4:09 PM

## 2017-09-22 NOTE — Progress Notes (Signed)
Patient ID: Gary Hicks, male   DOB: 17-Mar-1986, 32 y.o.   MRN: [id number]   LOS: 11 days   Subjective: Asked to reconsult on pt after he finally underwent MRI of left knee that shows a possible septic bursitis. He continues to have severe pain that he says is worse than on presentation.   Objective: Vital signs in last 24 hours: Temp:  [98.7 F (37.1 C)-99.4 F (37.4 C)] 98.7 F (37.1 C) (02/21 0957) Pulse Rate:  [103-109] 109 (02/21 0957) Resp:  [16-17] 17 (02/21 0957) BP: (104-109)/(71-78) 105/71 (02/21 0957) SpO2:  [98 %-100 %] 98 % (02/21 0957) Weight:  [73.1 kg (161 lb 1.1 oz)] 73.1 kg (161 lb 1.1 oz) (02/20 2056) Last BM Date: 09/17/17   Laboratory  CBC Recent Labs    09/21/17 0847 09/22/17 0426  WBC 7.0 5.9  HGB 10.3* 10.8*  HCT 31.2* 32.6*  PLT 317 326   BMET Recent Labs    09/21/17 0847 09/22/17 0426  NA 129* 129*  K 4.3 4.5  CL 94* 96*  CO2 24 23  GLUCOSE 96 92  BUN 8 9  CREATININE 0.57* 0.57*  CALCIUM 8.2* 8.2*     Physical Exam General appearance: alert and no distress  LLE No traumatic wounds, ecchymosis, or rash  Mod TTP prepatellar bursa, mild fluctuance, mild overlying erythema  No knee or ankle effusion  Knee ROM 175-45  Sens DPN, SPN, TN intact  Motor EHL, ext, flex, evers 5/5  DP 2+, PT 2+, No significant edema   Assessment/Plan: Left knee septic bursitis -- Will plan on I&D tomorrow afternoon by Dr. Percell Miller. Please keep NPO after MN.     Lisette Abu, PA-C Orthopedic Surgery [phone] 09/22/2017

## 2017-09-22 NOTE — H&P (View-Only) (Signed)
Patient ID: Gary Hicks, male   DOB: 01/31/1986, 32 y.o.   MRN: [id number]   LOS: 11 days   Subjective: Asked to reconsult on pt after he finally underwent MRI of left knee that shows a possible septic bursitis. He continues to have severe pain that he says is worse than on presentation.   Objective: Vital signs in last 24 hours: Temp:  [98.7 F (37.1 C)-99.4 F (37.4 C)] 98.7 F (37.1 C) (02/21 0957) Pulse Rate:  [103-109] 109 (02/21 0957) Resp:  [16-17] 17 (02/21 0957) BP: (104-109)/(71-78) 105/71 (02/21 0957) SpO2:  [98 %-100 %] 98 % (02/21 0957) Weight:  [73.1 kg (161 lb 1.1 oz)] 73.1 kg (161 lb 1.1 oz) (02/20 2056) Last BM Date: 09/17/17   Laboratory  CBC Recent Labs    09/21/17 0847 09/22/17 0426  WBC 7.0 5.9  HGB 10.3* 10.8*  HCT 31.2* 32.6*  PLT 317 326   BMET Recent Labs    09/21/17 0847 09/22/17 0426  NA 129* 129*  K 4.3 4.5  CL 94* 96*  CO2 24 23  GLUCOSE 96 92  BUN 8 9  CREATININE 0.57* 0.57*  CALCIUM 8.2* 8.2*     Physical Exam General appearance: alert and no distress  LLE No traumatic wounds, ecchymosis, or rash  Mod TTP prepatellar bursa, mild fluctuance, mild overlying erythema  No knee or ankle effusion  Knee ROM 175-45  Sens DPN, SPN, TN intact  Motor EHL, ext, flex, evers 5/5  DP 2+, PT 2+, No significant edema   Assessment/Plan: Left knee septic bursitis -- Will plan on I&D tomorrow afternoon by Dr. Percell Miller. Please keep NPO after MN.     Lisette Abu, PA-C Orthopedic Surgery [phone] 09/22/2017

## 2017-09-22 NOTE — Clinical Social Work Note (Signed)
CSW received consult for SNF placement. Patient discussed with SW Surveyor, quantity and is not an appropriate candidate for SNF placement due to his and his wife's IV drug use. CSW also received consult for drug treatment resources for patient and these will be provided prior to discharge. CSW will continue to follow and provide SW intervention services (drug treatment resources) to patient.  Tonie Vizcarrondo Givens, MSW, LCSW Licensed Clinical Social Worker [place] [phone]

## 2017-09-22 NOTE — Progress Notes (Addendum)
[place] for Infectious Disease  Date of Admission:  09/11/2017     Total days of antibiotics 10  Ancef 09/12/17  Dose of Oritavancin 09/15/17         Patient ID: Javohn Basey Heidel is a 32 y.o. male admitted with MSSA bacteremia and metastatic infection including:  Principal Problem:   Endocarditis of tricuspid valve Active Problems:   Bacteremia due to Staphylococcus aureus   Staphylococcal arthritis of right shoulder (HCC)   Septic infrapatellar bursitis of left knee   Staphylococcal arthritis of left hip (HCC)   Staphylococcal arthritis of right hip (HCC)   Pyomyositis   Saphenous vein thrombophlebitis, right   Heroin abuse (HCC)   Polysubstance abuse (HCC)   Severe sepsis (HCC)   Cigarette smoker   Hypokalemia   DIC (disseminated intravascular coagulation) ([place])   Calf abscess   Pain   . buprenorphine-naloxone  1 tablet Sublingual BID  . enoxaparin (LOVENOX) injection  70 mg Subcutaneous BID  . famotidine  20 mg Oral Daily  . feeding supplement (ENSURE ENLIVE)  237 mL Oral BID BM  . ferrous sulfate  325 mg Oral BID WC  . nicotine  14 mg Transdermal Daily    INTERVAL HX: Afebrile overnight. WBC normal. Still having pain in the left knee. He has been seen by Ortho team and is going for I&D of the left knee tomorrow with Dr. Percell Miller. He is having a lot of trouble with his anxiety and requesting something for this - He in the past has done well on klonopin + zoloft.   Allergies  Allergen Reactions  . Prednisone Hives  . Amoxicillin Hives    OBJECTIVE: Vitals:   09/21/17 0859 09/21/17 1639 09/21/17 2056 09/22/17 0957  BP: 122/74 109/78 104/73 105/71  Pulse: (!) 104 (!) 105 (!) 103 (!) 109  Resp: 16 16 17 17   Temp: 99 F (37.2 C) 99.4 F (37.4 C) 98.8 F (37.1 C) 98.7 F (37.1 C)  TempSrc: Oral Oral Oral Oral  SpO2: 98% 100% 100% 98%  Weight:   161 lb 1.1 oz (73.1 kg)   Height:       Body mass index is 23.79 kg/m.  Physical Exam  Constitutional:  He is oriented to person, place, and time.  Thin   HENT:  Mouth/Throat: Oropharynx is clear and moist. No oral lesions. Normal dentition. No dental caries.  Eyes: No scleral icterus.  Neck: JVD present.  Cardiovascular: Regular rhythm and intact distal pulses. Tachycardia present.  Murmur heard. Pulmonary/Chest: Effort normal and breath sounds normal. No respiratory distress. He exhibits no tenderness.  Abdominal: Soft. He exhibits no distension. There is no tenderness.  Musculoskeletal:       Arms:      Legs: Lymphadenopathy:    He has no cervical adenopathy.  Neurological: He is alert and oriented to person, place, and time.  Skin: Skin is warm and dry. No rash noted.  Psychiatric: Mood and affect normal.  Vitals reviewed.   Lab Results Lab Results  Component Value Date   WBC 5.9 09/22/2017   HGB 10.8 (L) 09/22/2017   HCT 32.6 (L) 09/22/2017   MCV 94.8 09/22/2017   PLT 326 09/22/2017    Lab Results  Component Value Date   CREATININE 0.57 (L) 09/22/2017   BUN 9 09/22/2017   NA 129 (L) 09/22/2017   K 4.5 09/22/2017   CL 96 (L) 09/22/2017   CO2 23 09/22/2017    Lab Results  Component Value Date   ALT 46 09/14/2017   AST 97 (H) 09/14/2017   ALKPHOS 91 09/14/2017   BILITOT 0.7 09/14/2017     Microbiology: BCx 2/09 >> 2/2 MSSA  BCx 2/10>> 1/2 MSSA  BCx 2/12 >> 2/2 MSSA BCx 2/15 >> NG x 4d   TTE 2/11 >> large 1.1 cm x 1.8 cm  Vegetation and moderate to severe regurgitation   [place] R Leg 2/18 >>saphenous vein thrombosis in lower right thigh  MRI R Shoulder >> Mild supraspinatus and infraspinatus muscle edema suggestive of Myositis.  Mild subacromial/subdeltoid bursitis.  MR Left Knee 2/19 >> 4.8 x 0.8 x 4 cm fluid collection in subcutaneous fat anterior to patellar tendon with severe surrounding soft tissue edema concerning for septic bursitis.   MRI Head > neg for abscess   MRI C Spine > Motion degraded exam. C2-3 through C7-T1 no bulge, stenosis or foraminal  narrowing. Mild C3-4 through C5-6 facet arthropathy. Early degenerative changes of the cervical spine without canal stenosis or neural foraminal narrowing.   MRI Right Femur > pending   ASSESSMENT: 32 y.o. male with history of recent and recurrent IV drug use admitted with MSSA bacteremia and metastatic infection including tricuspid valve endocarditis, septic left knee bursitis, septic thrombophlebitis of the saphenous vein.   PLAN: 1. Appreciate Ortho involvement and debridement of left knee.  2. Discussed with Lino what ideally would happen with treatment. He is too high risk to be discharged home with a PICC. He would need to finish course of IV therapy in hospital. Not candidate for SNF with suboxone use. OK to place PICC line after his surgery tomorrow.  3. With surgery tomorrow this will reset his clock.  4. D/W Dr. Evette Doffing about low dose benzo with suboxone - generally avoid benzos but very low dose OK with close monitoring.  5. Would also consider restarting zoloft since he will be here for several weeks. Hydroxyzine or clonidine PM to help?   Janene Madeira, MSN, NP-C [facility] [place] [facility] Cell: [phone] Pager: [phone]  09/22/2017  4:22 PM

## 2017-09-23 ENCOUNTER — Encounter (HOSPITAL_COMMUNITY)
Admission: EM | Disposition: A | Discharge status: Home / Self Care | Payer: Self-pay | Source: Home / Self Care | Attending: Internal Medicine

## 2017-09-23 ENCOUNTER — Inpatient Hospital Stay (HOSPITAL_COMMUNITY): Payer: Self-pay | Admitting: Certified Registered Nurse Anesthetist

## 2017-09-23 ENCOUNTER — Ambulatory Visit: Payer: Self-pay | Admitting: Internal Medicine

## 2017-09-23 ENCOUNTER — Encounter (HOSPITAL_COMMUNITY): Payer: Self-pay

## 2017-09-23 DIAGNOSIS — F1990 Other psychoactive substance use, unspecified, uncomplicated: Secondary | ICD-10-CM

## 2017-09-23 DIAGNOSIS — R7881 Bacteremia: Secondary | ICD-10-CM

## 2017-09-23 DIAGNOSIS — F410 Panic disorder [episodic paroxysmal anxiety] without agoraphobia: Secondary | ICD-10-CM

## 2017-09-23 DIAGNOSIS — Z79899 Other long term (current) drug therapy: Secondary | ICD-10-CM

## 2017-09-23 DIAGNOSIS — F419 Anxiety disorder, unspecified: Secondary | ICD-10-CM | POA: Diagnosis present

## 2017-09-23 HISTORY — PX: I & D EXTREMITY: SHX5045

## 2017-09-23 LAB — CBC WITH DIFFERENTIAL/PLATELET
BASOS ABS: 0.1 10*3/uL (ref 0.0–0.1)
Basophils Relative: 1 %
Eosinophils Absolute: 0.1 10*3/uL (ref 0.0–0.7)
Eosinophils Relative: 1 %
HEMATOCRIT: 35 % — AB (ref 39.0–52.0)
Hemoglobin: 11.8 g/dL — ABNORMAL LOW (ref 13.0–17.0)
LYMPHS ABS: 2.1 10*3/uL (ref 0.7–4.0)
LYMPHS PCT: 27 %
MCH: 31.9 pg (ref 26.0–34.0)
MCHC: 33.7 g/dL (ref 30.0–36.0)
MCV: 94.6 fL (ref 78.0–100.0)
Monocytes Absolute: 0.6 10*3/uL (ref 0.1–1.0)
Monocytes Relative: 7 %
NEUTROS PCT: 64 %
Neutro Abs: 5.1 10*3/uL (ref 1.7–7.7)
PLATELETS: 392 10*3/uL (ref 150–400)
RBC: 3.7 MIL/uL — AB (ref 4.22–5.81)
RDW: 13.1 % (ref 11.5–15.5)
WBC: 7.9 10*3/uL (ref 4.0–10.5)

## 2017-09-23 LAB — BASIC METABOLIC PANEL
Anion gap: 12 (ref 5–15)
BUN: 10 mg/dL (ref 6–20)
CALCIUM: 8.9 mg/dL (ref 8.9–10.3)
CO2: 26 mmol/L (ref 22–32)
Chloride: 96 mmol/L — ABNORMAL LOW (ref 101–111)
Creatinine, Ser: 0.66 mg/dL (ref 0.61–1.24)
GFR calc non Af Amer: >60 mL/min (ref >60)
Glucose, Bld: 98 mg/dL (ref 65–99)
Potassium: 4.6 mmol/L (ref 3.5–5.1)
Sodium: 134 mmol/L — ABNORMAL LOW (ref 135–145)

## 2017-09-23 LAB — SURGICAL PCR SCREEN
MRSA, PCR: NEGATIVE
Staphylococcus aureus: POSITIVE — AB

## 2017-09-23 SURGERY — IRRIGATION AND DEBRIDEMENT EXTREMITY
Anesthesia: General | Site: Knee | Laterality: Left

## 2017-09-23 MED ORDER — SODIUM CHLORIDE 0.9 % IR SOLN
Status: DC | PRN
  Administered 2017-09-23: 1

## 2017-09-23 MED ORDER — LACTATED RINGERS IV SOLN
INTRAVENOUS | Status: DC
  Administered 2017-09-23: 13:00:00 via INTRAVENOUS

## 2017-09-23 MED ORDER — LACTATED RINGERS IV SOLN: INTRAVENOUS | Status: DC

## 2017-09-23 MED ORDER — SERTRALINE HCL 50 MG PO TABS
50.0000 mg | ORAL_TABLET | Freq: Every day | ORAL | Status: DC
  Administered 2017-09-23 – 2017-10-08 (×16): 50 mg via ORAL
  Filled 2017-09-23 (×16): qty 1

## 2017-09-23 MED ORDER — ONDANSETRON HCL 4 MG/2ML IJ SOLN
INTRAMUSCULAR | Status: DC | PRN
  Administered 2017-09-23: 4 mg via INTRAVENOUS

## 2017-09-23 MED ORDER — TRAZODONE HCL 50 MG PO TABS
50.0000 mg | ORAL_TABLET | Freq: Every evening | ORAL | Status: DC | PRN
  Administered 2017-09-23 – 2017-11-03 (×36): 50 mg via ORAL
  Filled 2017-09-23 (×38): qty 1

## 2017-09-23 MED ORDER — FENTANYL CITRATE (PF) 250 MCG/5ML IJ SOLN
INTRAMUSCULAR | Status: AC
  Filled 2017-09-23: qty 5

## 2017-09-23 MED ORDER — HYDROMORPHONE HCL 1 MG/ML IJ SOLN
INTRAMUSCULAR | Status: AC
  Filled 2017-09-23: qty 1

## 2017-09-23 MED ORDER — ONDANSETRON HCL 4 MG/2ML IJ SOLN
INTRAMUSCULAR | Status: AC
  Filled 2017-09-23: qty 2

## 2017-09-23 MED ORDER — PHENYLEPHRINE 40 MCG/ML (10ML) SYRINGE FOR IV PUSH (FOR BLOOD PRESSURE SUPPORT)
PREFILLED_SYRINGE | INTRAVENOUS | Status: DC | PRN
  Administered 2017-09-23: 120 ug via INTRAVENOUS
  Administered 2017-09-23: 160 ug via INTRAVENOUS

## 2017-09-23 MED ORDER — LIDOCAINE 2% (20 MG/ML) 5 ML SYRINGE
INTRAMUSCULAR | Status: DC | PRN
  Administered 2017-09-23: 40 mg via INTRAVENOUS

## 2017-09-23 MED ORDER — OXYCODONE HCL 5 MG/5ML PO SOLN: 5.0000 mg | Freq: Once | ORAL | Status: DC | PRN

## 2017-09-23 MED ORDER — PROPOFOL 10 MG/ML IV BOLUS
INTRAVENOUS | Status: DC | PRN
  Administered 2017-09-23: 140 mg via INTRAVENOUS

## 2017-09-23 MED ORDER — FENTANYL CITRATE (PF) 250 MCG/5ML IJ SOLN
INTRAMUSCULAR | Status: DC | PRN
  Administered 2017-09-23: 50 ug via INTRAVENOUS

## 2017-09-23 MED ORDER — HYDROMORPHONE HCL 1 MG/ML IJ SOLN
0.2500 mg | INTRAMUSCULAR | Status: DC | PRN
  Administered 2017-09-23 (×2): 0.5 mg via INTRAVENOUS

## 2017-09-23 MED ORDER — MIDAZOLAM HCL 2 MG/2ML IJ SOLN
INTRAMUSCULAR | Status: AC
  Filled 2017-09-23: qty 2

## 2017-09-23 MED ORDER — OXYCODONE HCL 5 MG PO TABS: 5.0000 mg | ORAL_TABLET | Freq: Once | ORAL | Status: DC | PRN

## 2017-09-23 MED ORDER — PROPOFOL 10 MG/ML IV BOLUS
INTRAVENOUS | Status: AC
  Filled 2017-09-23: qty 20

## 2017-09-23 SURGICAL SUPPLY — 57 items
BANDAGE ACE 4X5 VEL STRL LF (GAUZE/BANDAGES/DRESSINGS) ×3 IMPLANT
BANDAGE ACE 6X5 VEL STRL LF (GAUZE/BANDAGES/DRESSINGS) ×3 IMPLANT
BANDAGE ELASTIC 6 VELCRO ST LF (GAUZE/BANDAGES/DRESSINGS) ×3 IMPLANT
BANDAGE ESMARK 6X9 LF (GAUZE/BANDAGES/DRESSINGS) IMPLANT
BLADE SURG 10 STRL SS (BLADE) ×3 IMPLANT
BNDG COHESIVE 4X5 TAN STRL (GAUZE/BANDAGES/DRESSINGS) IMPLANT
BNDG ESMARK 4X9 LF (GAUZE/BANDAGES/DRESSINGS) IMPLANT
BNDG ESMARK 6X9 LF (GAUZE/BANDAGES/DRESSINGS)
BNDG GAUZE ELAST 4 BULKY (GAUZE/BANDAGES/DRESSINGS) IMPLANT
CONT SPEC 4OZ CLIKSEAL STRL BL (MISCELLANEOUS) IMPLANT
COVER SURGICAL LIGHT HANDLE (MISCELLANEOUS) ×3 IMPLANT
CUFF TOURN SGL LL 12 NO SLV (MISCELLANEOUS) IMPLANT
CUFF TOURNIQUET SINGLE 34IN LL (TOURNIQUET CUFF) IMPLANT
DRAIN PENROSE 18X1/4 LTX STRL (WOUND CARE) ×3 IMPLANT
DRAPE SURG 17X23 STRL (DRAPES) IMPLANT
DRAPE U-SHAPE 47X51 STRL (DRAPES) IMPLANT
DRSG ADAPTIC 3X8 NADH LF (GAUZE/BANDAGES/DRESSINGS) ×3 IMPLANT
DRSG PAD ABDOMINAL 8X10 ST (GAUZE/BANDAGES/DRESSINGS) ×3 IMPLANT
DURAPREP 26ML APPLICATOR (WOUND CARE) ×3 IMPLANT
ELECT REM PT RETURN 9FT ADLT (ELECTROSURGICAL)
ELECTRODE REM PT RTRN 9FT ADLT (ELECTROSURGICAL) IMPLANT
EVACUATOR 1/8 PVC DRAIN (DRAIN) IMPLANT
FACESHIELD WRAPAROUND (MASK) IMPLANT
GAUZE SPONGE 4X4 12PLY STRL (GAUZE/BANDAGES/DRESSINGS) ×3 IMPLANT
GAUZE XEROFORM 1X8 LF (GAUZE/BANDAGES/DRESSINGS) ×3 IMPLANT
GLOVE BIO SURGEON STRL SZ7.5 (GLOVE) ×6 IMPLANT
GLOVE BIOGEL PI IND STRL 8 (GLOVE) ×2 IMPLANT
GLOVE BIOGEL PI INDICATOR 8 (GLOVE) ×4
GOWN STRL REUS W/ TWL LRG LVL3 (GOWN DISPOSABLE) ×3 IMPLANT
GOWN STRL REUS W/TWL LRG LVL3 (GOWN DISPOSABLE) ×6
HANDPIECE INTERPULSE COAX TIP (DISPOSABLE)
KIT BASIN OR (CUSTOM PROCEDURE TRAY) ×3 IMPLANT
KIT ROOM TURNOVER OR (KITS) ×3 IMPLANT
MANIFOLD NEPTUNE II (INSTRUMENTS) ×3 IMPLANT
NEEDLE 25GAX1.5 (MISCELLANEOUS) IMPLANT
NS IRRIG 1000ML POUR BTL (IV SOLUTION) ×3 IMPLANT
PACK ORTHO EXTREMITY (CUSTOM PROCEDURE TRAY) ×3 IMPLANT
PAD ABD 8X10 STRL (GAUZE/BANDAGES/DRESSINGS) ×6 IMPLANT
PAD ARMBOARD 7.5X6 YLW CONV (MISCELLANEOUS) ×6 IMPLANT
PAD CAST 4YDX4 CTTN HI CHSV (CAST SUPPLIES) ×1 IMPLANT
PADDING CAST COTTON 4X4 STRL (CAST SUPPLIES) ×2
PADDING CAST COTTON 6X4 STRL (CAST SUPPLIES) ×3 IMPLANT
SET HNDPC FAN SPRY TIP SCT (DISPOSABLE) IMPLANT
SPONGE LAP 18X18 X RAY DECT (DISPOSABLE) IMPLANT
STOCKINETTE IMPERVIOUS 9X36 MD (GAUZE/BANDAGES/DRESSINGS) ×3 IMPLANT
SUT ETHILON 3 0 PS 1 (SUTURE) ×3 IMPLANT
SUT PDS AB 2-0 CT1 27 (SUTURE) IMPLANT
SWAB CULTURE ESWAB REG 1ML (MISCELLANEOUS) IMPLANT
SYR BULB IRRIGATION 50ML (SYRINGE) ×3 IMPLANT
SYR CONTROL 10ML LL (SYRINGE) IMPLANT
TOWEL OR 17X24 6PK STRL BLUE (TOWEL DISPOSABLE) ×3 IMPLANT
TOWEL OR 17X26 10 PK STRL BLUE (TOWEL DISPOSABLE) ×3 IMPLANT
TUBE CONNECTING 12'X1/4 (SUCTIONS) ×1
TUBE CONNECTING 12X1/4 (SUCTIONS) ×2 IMPLANT
TUBING CYSTO DISP (UROLOGICAL SUPPLIES) IMPLANT
UNDERPAD 30X30 (UNDERPADS AND DIAPERS) ×3 IMPLANT
YANKAUER SUCT BULB TIP NO VENT (SUCTIONS) ×3 IMPLANT

## 2017-09-23 NOTE — Anesthesia Procedure Notes (Addendum)
Procedure Name: LMA Insertion Date/Time: 09/23/2017 2:42 PM Performed by: Oleta Mouse, MD Pre-anesthesia Checklist: Patient identified, Emergency Drugs available, Suction available, Patient being monitored and Timeout performed Patient Re-evaluated:Patient Re-evaluated prior to induction Oxygen Delivery Method: Circle system utilized Preoxygenation: Pre-oxygenation with 100% oxygen Induction Type: IV induction LMA: LMA inserted LMA Size: 5.0 Number of attempts: 1 Placement Confirmation: positive ETCO2 and breath sounds checked- equal and bilateral Tube secured with: Tape Dental Injury: Teeth and Oropharynx as per pre-operative assessment
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
## 2017-09-23 NOTE — Progress Notes (Signed)
[facility] for Lovenox Indication: DVT  Allergies  Allergen Reactions  . Prednisone Hives  . Amoxicillin Hives    Patient Measurements: Height: 5\' 9"  (175.3 cm) Weight: 163 lb 2.3 oz (74 kg) IBW/kg (Calculated) : 70.7 Lovenox Dosing Weight: 70 kg  Vital Signs: Temp: 98 F (36.7 C) (02/22 0500) Temp Source: Oral (02/22 0500) BP: 98/68 (02/22 0500) Pulse Rate: 110 (02/22 0500)  Labs: Recent Labs    09/21/17 0847 09/22/17 0426 09/23/17 0500  HGB 10.3* 10.8* 11.8*  HCT 31.2* 32.6* 35.0*  PLT 317 326 392  CREATININE 0.57* 0.57* 0.66    Estimated Creatinine Clearance: 133.8 mL/min (by C-G formula based on SCr of 0.66 mg/dL).  Assessment: 79 yoM with history of recurrent IVDU admitted with MSSA bacteremia and metastatic infection including tricuspid valve endocarditis, septic knee bursitis, septic thrombophlebitis of the saphenous vein. Bilateral lower extremity venous duplex shows evidence of acute DVT of the right lower extremity.   H/H: low but stable, PLT wnl  Goal of Therapy:  Monitor platelets by anticoagulation protocol: Yes   Plan:  Lovenox 70 mg SQ q12h Monitor CBC, renal function Monitor for signs/symptoms of bleeding F/U change to an oral agent   Renold Genta, PharmD, BCPS Clinical Pharmacist Phone for today - [facility] - [phone] 09/23/2017 10:43 AM

## 2017-09-23 NOTE — Interval H&P Note (Signed)
History and Physical Interval Note:  09/23/2017 2:05 PM  Gary Hicks  has presented today for surgery, with the diagnosis of left knee septic bursitis  The various methods of treatment have been discussed with the patient and family. After consideration of risks, benefits and other options for treatment, the patient has consented to  Procedure(s): IRRIGATION AND DEBRIDEMENT KNEE (Left) as a surgical intervention .  The patient's history has been reviewed, patient examined, no change in status, stable for surgery.  I have reviewed the patient's chart and labs.  Questions were answered to the patient's satisfaction.     Mailani Degroote D

## 2017-09-23 NOTE — Anesthesia Preprocedure Evaluation (Signed)
Anesthesia Evaluation  Patient identified by MRN, date of birth, ID band Patient awake    Reviewed: Allergy & Precautions, NPO status , Patient's Chart, lab work & pertinent test results  History of Anesthesia Complications Negative for: history of anesthetic complications  Airway Mallampati: II  TM Distance: >3 FB Neck ROM: Full    Dental  (+) Teeth Intact   Pulmonary Current Smoker,    breath sounds clear to auscultation       Cardiovascular (-) hypertension(-) angina+ Peripheral Vascular Disease  (-) Past MI  Rhythm:Regular     Neuro/Psych Seizures -,   Neuromuscular disease negative psych ROS   GI/Hepatic negative GI ROS, Neg liver ROS,   Endo/Other  negative endocrine ROS  Renal/GU Renal disease     Musculoskeletal  (+) Arthritis ,   Abdominal   Peds  Hematology  (+) anemia ,   Anesthesia Other Findings Left ventricle: The cavity size was normal. Systolic function was   normal. The estimated ejection fraction was in the range of 60%   to 65%. Wall motion was normal; there were no regional wall   motion abnormalities. - Left atrium: The atrium was mildly dilated. - Right ventricle: The cavity size was moderately dilated. Wall   thickness was normal. - Right atrium: The atrium was moderately dilated. - Tricuspid valve: There was a large, 1.1 cm (W) x 1.8 cm (L),   multilobulated, highly mobile vegetation on the right ventricular   aspect of the anterior leaflet. There was moderate-severe   regurgitation. - Pulmonary arteries: Systolic pressure was mildly increased. PA   peak pressure: 44 mm Hg (S). - Pericardium, extracardiac: A trivial pericardial effusion was   identified posterior to the heart.   Reproductive/Obstetrics                             Anesthesia Physical Anesthesia Plan  ASA: II  Anesthesia Plan: General   Post-op Pain Management:    Induction:  Intravenous  PONV Risk Score and Plan: 1 and Ondansetron  Airway Management Planned: LMA  Additional Equipment: None  Intra-op Plan:   Post-operative Plan: Extubation in OR  Informed Consent: I have reviewed the patients History and Physical, chart, labs and discussed the procedure including the risks, benefits and alternatives for the proposed anesthesia with the patient or authorized representative who has indicated his/her understanding and acceptance.   Dental advisory given  Plan Discussed with: CRNA and Surgeon  Anesthesia Plan Comments:         Anesthesia Quick Evaluation

## 2017-09-23 NOTE — Transfer of Care (Signed)
Immediate Anesthesia Transfer of Care Note  Patient: Gary Hicks  Procedure(s) Performed: IRRIGATION AND DEBRIDEMENT KNEE (Left Knee)  Patient Location: PACU  Anesthesia Type:General  Level of Consciousness: lethargic and responds to stimulation  Airway & Oxygen Therapy: Patient Spontanous Breathing and Patient connected to nasal cannula oxygen  Post-op Assessment: Report given to RN  Post vital signs: Reviewed and stable  Last Vitals:  Vitals:   09/23/17 1000 09/23/17 1513  BP: 112/71 113/80  Pulse: 98 (!) 110  Resp: 18 18  Temp: 36.7 C 36.5 C  SpO2: 98% 100%    Last Pain:  Vitals:   09/23/17 1000  TempSrc: Oral  PainSc:       Patients Stated Pain Goal: 0 (88/64/84 7207)  Complications: No apparent anesthesia complications

## 2017-09-23 NOTE — Progress Notes (Signed)
PROGRESS NOTE  Gary Hicks JJO:[id number] DOB: 1985-11-01 DOA: 09/11/2017 PCP: Patient, [name] Per  HPI/Recap of past 58 hours: 32 year old male, active IVDA, who presents with generalized pain. Patient was incarcerated and released 3-4 weeks ago. Patient admits using IV methamphetamine as well as heroin in the past 24 hours prior to admission. Reports global polyarthralgias including his bilateral feet bilateral knees bilateral hips and shoulders. Also reports fevers and chills generalized myalgias. Pt admitted for further management. 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 Pt was also diagnosed with DVT Right gastrocnemius vein . He was started on Lovenox 2/18.  Today, pt still c/o L knee pain, thigh pain. Reports anxiety better with klonopin. Psych consult pending. Denies any chest pain, SOB, abdominal pain, fever/chills. For I&D of L knee today 09/23/17    Assessment/Plan: Principal Problem:   Endocarditis of tricuspid valve Active Problems:   Heroin abuse (HCC)   Polysubstance abuse (HCC)   Severe sepsis ([place])   Bacteremia due to Staphylococcus aureus   Cigarette smoker   Hypokalemia   DIC (disseminated intravascular coagulation) ([place])   Staphylococcal arthritis of right shoulder (HCC)   Septic infrapatellar bursitis of left knee   Staphylococcal arthritis of left hip (HCC)   Staphylococcal arthritis of right hip (HCC)   Calf abscess   Pain   Pyomyositis   Saphenous vein thrombophlebitis, right  Tricuspid valve endocarditis with MSSA bacteremia Currently afebrile, no leukocytosis Blood culture 2/11 grew MSSA, repeat on 2/15 NGTD ECHO showed: Tricuspid valve: There was a large, 1.1 cm (W) x 1.8 cm (L), multilobulated, highly mobile vegetation on the right ventricular aspect of the anterior leaflet. 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 MRI Right shoulder: Mild supraspinatus and infraspinatus muscle edema suggestive of Myositis MRI brain negative for abscess MRI C spine: Motion degraded exam. C2-3 through C7-T1 no bulge, stenosis or foraminal narrowing. Mild C3-4 through C5-6 facet arthropathy MRI R femur: currently pending as pt refused Doppler left arm: Negative for DVT Orthopedic consulted for septic bursitis/abscess of L knee, I&D scheduled for today 09/23/17  IVDA  Dr Damita Dunnings managing Suboxone BID  No high dose IV benzo unless required for seizure  GAD Dr Damita Dunnings ok to use low dose PO benzo prn Psych consult placed  Seizures Likely related to drug use Seizure precautions PRN Ativan MRI negative  Iron def anemia Continue PO iron Monitor hb on lovenox  DVT right Gastrocnemius vein;  Also superficial saphenous thrombophlebitis.  Continue Lovenox per pharmacy Monitor platelet and CBC  Thrombocytopenia Resolved  Hyponatremia IV fluids Daily BMP   Hepatitis c antibody positive ID on board     Code Status: Full  Family Communication: None at bedside  Disposition Plan: TBD   Consultants:  ID  Orthopedics  Procedures:  None  Antimicrobials:  IV Ancef  DVT prophylaxis:  Therapeutic lovenox   Objective: Vitals:   09/23/17 1513 09/23/17 1530 09/23/17 1545 09/23/17 1600  BP: 113/80 124/79 125/88 126/88  Pulse: (!) 110 (!) 108 (!) 108 (!) 116  Resp: 18 10 14 10   Temp: 97.7 F (36.5 C)     TempSrc:      SpO2: 100% 100% 100%  100%  Weight:      Height:        Intake/Output Summary (Last 24 hours) at 09/23/2017 1615 Last data filed at 09/23/2017 1500 Gross per 24 hour  Intake 920 ml  Output 2250 ml  Net -1330 ml   Filed Weights   09/21/17 2056 09/22/17  2110 09/23/17 1328  Weight: 73.1 kg (161 lb 1.1 oz) 74 kg (163 lb 2.3 oz) 74 kg (163 lb 2.3 oz)    Exam:   General:  NAD   Cardiovascular: S1, S2, RRR  Respiratory: Chest clear B/L   Abdomen: Soft, NT, ND, BS present  Musculoskeletal: L knee with mild edema, pain   Skin: Rash noted  Psychiatry: Fair mood   Data Reviewed: CBC: Recent Labs  Lab 09/19/17 0603 09/20/17 0823 09/21/17 0847 09/22/17 0426 09/23/17 0500  WBC 9.2 8.2 7.0 5.9 7.9  NEUTROABS  --   --   --  3.4 5.1  HGB 10.5* 9.9* 10.3* 10.8* 11.8*  HCT 31.8* 29.6* 31.2* 32.6* 35.0*  MCV 93.3 95.2 96.3 94.8 94.6  PLT 200 331 317 326 631   Basic Metabolic Panel: Recent Labs  Lab 09/18/17 0508 09/20/17 0823 09/21/17 0847 09/22/17 0426 09/23/17 0500  NA 131* 132* 129* 129* 134*  K 4.0 4.0 4.3 4.5 4.6  CL 101 98* 94* 96* 96*  CO2 20* 24 24 23 26   GLUCOSE 92 106* 96 92 98  BUN 9 7 8 9 10   CREATININE 0.69 0.69 0.57* 0.57* 0.66  CALCIUM 7.6* 8.1* 8.2* 8.2* 8.9   GFR: Estimated Creatinine Clearance: 133.8 mL/min (by C-G formula based on SCr of 0.66 mg/dL). Liver Function Tests: No results for input(s): AST, ALT, ALKPHOS, BILITOT, PROT, ALBUMIN in the last 168 hours. No results for input(s): LIPASE, AMYLASE in the last 168 hours. No results for input(s): AMMONIA in the last 168 hours. Coagulation Profile: No results for input(s): INR, PROTIME in the last 168 hours. Cardiac Enzymes: No results for input(s): CKTOTAL, CKMB, CKMBINDEX, TROPONINI in the last 168 hours. BNP (last 3 results) No results for input(s): PROBNP in the last 8760 hours. HbA1C: No results for input(s): HGBA1C in the last 72 hours. CBG: No results for input(s): GLUCAP in the last 168 hours. Lipid Profile: No results for input(s): CHOL, HDL, LDLCALC, TRIG, CHOLHDL, LDLDIRECT in the last 72 hours. Thyroid Function Tests: No results for input(s): TSH, T4TOTAL, FREET4, T3FREE, THYROIDAB in the last 72 hours. Anemia Panel: No results  for input(s): VITAMINB12, FOLATE, FERRITIN, TIBC, IRON, RETICCTPCT in the last 72 hours. Urine analysis:    Component Value Date/Time   COLORURINE YELLOW 09/10/2017 [place] 09/10/2017 1657   LABSPEC 1.019 09/10/2017 1657   PHURINE 6.0 09/10/2017 1657   GLUCOSEU NEGATIVE 09/10/2017 1657   HGBUR NEGATIVE 09/10/2017 1657   BILIRUBINUR NEGATIVE 09/10/2017 1657   KETONESUR NEGATIVE 09/10/2017 1657   PROTEINUR NEGATIVE 09/10/2017 1657   UROBILINOGEN 0.2 05/16/2012 0847   NITRITE NEGATIVE 09/10/2017 1657   LEUKOCYTESUR NEGATIVE 09/10/2017 1657   Sepsis Labs: @LABRCNTIP (procalcitonin:4,lacticidven:4)  ) Recent Results (from the past 240 hour(s))  Culture, blood (routine x 2)     Status: None   Collection Time: 09/16/17  9:44 AM  Result Value Ref Range Status   Specimen Description BLOOD RIGHT HAND  Final   Special Requests   Final    BOTTLES DRAWN AEROBIC AND ANAEROBIC Blood Culture results may not be optimal due to an inadequate volume of blood received in culture bottles  Culture   Final    NO GROWTH 5 DAYS Performed at [facility], [place] [address], [place], [place] [address]    Report Status 09/21/2017 FINAL  Final  Culture, blood (routine x 2)     Status: None   Collection Time: 09/16/17  9:52 AM  Result Value Ref Range Status   Specimen Description BLOOD LEFT HAND  Final   Special Requests   Final    BOTTLES DRAWN AEROBIC AND ANAEROBIC Blood Culture adequate volume   Culture   Final    NO GROWTH 5 DAYS Performed at [facility], [place] [address], [place], [place] [address]    Report Status 09/21/2017 FINAL  Final  Surgical pcr screen     Status: Abnormal   Collection Time: 09/23/17 12:03 AM  Result Value Ref Range Status   MRSA, PCR NEGATIVE NEGATIVE Final   Staphylococcus aureus POSITIVE (A) NEGATIVE Final    Comment: (NOTE) The Xpert SA Assay (FDA approved for NASAL specimens in patients 72 years of age and older), is one component of a  comprehensive surveillance program. It is not intended to diagnose infection nor to guide or monitor treatment. Performed at [facility], [place] [address] [place], [place] [address]       Studies: No results found.  Scheduled Meds: . [MAR Hold] buprenorphine-naloxone  1 tablet Sublingual BID  . chlorhexidine  60 mL Topical Once  . [MAR Hold] enoxaparin (LOVENOX) injection  70 mg Subcutaneous BID  . [MAR Hold] famotidine  20 mg Oral Daily  . [MAR Hold] feeding supplement (ENSURE ENLIVE)  237 mL Oral BID BM  . [MAR Hold] ferrous sulfate  325 mg Oral BID WC  . HYDROmorphone      . [MAR Hold] nicotine  14 mg Transdermal Daily  . povidone-iodine  2 application Topical Once    Continuous Infusions: . sodium chloride 1,000 mL (09/23/17 0655)  . [MAR Hold]  ceFAZolin (ANCEF) IV Stopped (09/23/17 0701)  . lactated ringers    . lactated ringers    . lactated ringers 10 mL/hr at 09/23/17 1329     LOS: 12 days     Alma Friendly, MD [facility]   If 7PM-7AM, please contact night-coverage [email or web address] Password [facility] 09/23/2017, 4:15 PM

## 2017-09-24 LAB — HCV RNA QUANT RFLX ULTRA OR GENOTYP: HCV RNA Qnt(log copy/mL): UNDETERMINED log10 IU/mL

## 2017-09-24 MED ORDER — ENSURE ENLIVE PO LIQD
237.0000 mL | Freq: Three times a day (TID) | ORAL | Status: DC
  Administered 2017-09-25 – 2017-11-05 (×75): 237 mL via ORAL

## 2017-09-24 NOTE — Progress Notes (Signed)
Orthopaedic Progress Note  S: Complains of pain in left knee and both shoulders  O:  Vitals:   09/23/17 2216 09/24/17 0438  BP: 128/87 109/72  Pulse: (!) 119 (!) 119  Resp: 18 16  Temp: 99.4 F (37.4 C) 97.9 F (36.6 C)  SpO2: 96% 95%   Gen: Resting, no acute distress LLE: Dressing clean, dry and intact. Compartments soft and compressible. Neurovascularly intact  Labs: Pending this AM  A/P: 32yo male s/p I&D of left knee prepatellar busitis  -WBAT LLE -Dressing change PRN over weekend, likely change Monday -Antibiotics per primary team -Follow up cultures from OR  Shona Needles, MD [facility] [phone] (phone)

## 2017-09-24 NOTE — Progress Notes (Signed)
PROGRESS NOTE  Gary Hicks Cala DPO:[id number] DOB: 1985/10/03 DOA: 09/11/2017 PCP: Patient, [name] Per  HPI/Recap of past 34 hours: 32 year old male, active IVDA, who presents with generalized pain. Patient was incarcerated and released 3-4 weeks ago. Patient admits using IV methamphetamine as well as heroin in the past 24 hours prior to admission. Reports global polyarthralgias including his bilateral feet bilateral knees bilateral hips and shoulders. Also reports fevers and chills generalized myalgias. Pt admitted for further management. 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 Pt was also diagnosed with DVT Right gastrocnemius vein . He was started on Lovenox 2/18.  Today, pt reported post op pain. Reports poor appetite, not hungry. Denies any chest pain, SOB, abdominal pain, fever/chills. Left knee sore, post op. Pt refused labs this morning.    Assessment/Plan: Principal Problem:   Anxiety Active Problems:   Heroin abuse (HCC)   Polysubstance abuse (HCC)   Severe sepsis ([place])   Bacteremia due to Staphylococcus aureus   Cigarette smoker   Hypokalemia   DIC (disseminated intravascular coagulation) (HCC)   Staphylococcal arthritis of right shoulder (HCC)   Septic infrapatellar bursitis of left knee   Staphylococcal arthritis of left hip (HCC)   Staphylococcal arthritis of right hip (HCC)   Endocarditis of tricuspid valve   Calf abscess   Pain   Pyomyositis   Saphenous vein thrombophlebitis, right  Tricuspid valve endocarditis with MSSA bacteremia Currently afebrile, no leukocytosis Blood culture 2/11 grew MSSA, repeat on 2/15 NGTD ECHO showed: Tricuspid valve: There was a large, 1.1 cm (W) x 1.8 cm (L), multilobulated, highly mobile vegetation on the right ventricular aspect of the anterior leaflet. 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 MRI Right shoulder: Mild supraspinatus and infraspinatus muscle edema suggestive of Myositis MRI brain negative for abscess MRI C spine: Motion degraded exam. C2-3 through C7-T1 no bulge, stenosis or foraminal narrowing. Mild C3-4 through C5-6 facet arthropathy MRI R femur: currently pending as pt refused Doppler left arm: Negative for DVT  IVDA  Dr Damita Dunnings managing Suboxone BID  No high dose IV benzo unless required for seizure  GAD Psych consulted: start zoloft 50 mg daily May continue Klonopin prn for 2 weeks and wean off, not advised for long term  Please refer to note dated 2/22 by psych attending for more info  Seizures Likely related to drug use Seizure precautions PRN Ativan MRI negative  Iron def anemia Continue PO iron Monitor hb on lovenox  DVT right Gastrocnemius vein Also superficial saphenous thrombophlebitis.  Continue Lovenox per pharmacy Monitor platelet and CBC  Thrombocytopenia Resolved  Hyponatremia IV fluids Daily BMP   Hepatitis c antibody positive ID on board     Code Status: Full  Family Communication: None at bedside  Disposition Plan: TBD   Consultants:  ID  Orthopedics  Procedures:  None  Antimicrobials:  IV Ancef  DVT prophylaxis:  Therapeutic lovenox   Objective: Vitals:   09/23/17 1615 09/23/17 1705 09/23/17 2216 09/24/17 0438  BP:  130/82 128/87 109/72  Pulse:  (!) 117 (!) 119 (!) 119  Resp:  18 18 16   Temp: 98.8 F (37.1 C) 99.6 F (37.6 C) 99.4 F (37.4 C) 97.9 F (36.6 C)  TempSrc:  Oral Oral Oral  SpO2:  100% 96% 95%  Weight:      Height:        Intake/Output Summary (Last 24 hours) at  09/24/2017 1410 Last data filed at 09/24/2017 0900 Gross per 24 hour  Intake 3325 ml  Output 2825 ml  Net 500 ml   Filed Weights   09/21/17 2056 09/22/17 2110 09/23/17 1328  Weight: 73.1 kg (161 lb 1.1 oz) 74 kg (163 lb 2.3 oz) 74 kg (163 lb 2.3 oz)    Exam:   General:  NAD   Cardiovascular: S1, S2, RRR  Respiratory: Chest clear B/L   Abdomen: Soft, NT, ND, BS present  Musculoskeletal: L knee with ace wrap, TTP   Skin: Rash noted  Psychiatry: Fair mood   Data Reviewed: CBC: Recent Labs  Lab 09/19/17 0603 09/20/17 0823 09/21/17 0847 09/22/17 0426 09/23/17 0500  WBC 9.2 8.2 7.0 5.9 7.9  NEUTROABS  --   --   --  3.4 5.1  HGB 10.5* 9.9* 10.3* 10.8* 11.8*  HCT 31.8* 29.6* 31.2* 32.6* 35.0*  MCV 93.3 95.2 96.3 94.8 94.6  PLT 200 331 317 326 379   Basic Metabolic Panel: Recent Labs  Lab 09/18/17 0508 09/20/17 0823 09/21/17 0847 09/22/17 0426 09/23/17 0500  NA 131* 132* 129* 129* 134*  K 4.0 4.0 4.3 4.5 4.6  CL 101 98* 94* 96* 96*  CO2 20* 24 24 23 26   GLUCOSE 92 106* 96 92 98  BUN 9 7 8 9 10   CREATININE 0.69 0.69 0.57* 0.57* 0.66  CALCIUM 7.6* 8.1* 8.2* 8.2* 8.9   GFR: Estimated Creatinine Clearance: 133.8 mL/min (by C-G formula based on SCr of 0.66 mg/dL). Liver Function Tests: No results for input(s): AST, ALT, ALKPHOS, BILITOT, PROT, ALBUMIN in the last 168 hours. No results for input(s): LIPASE, AMYLASE in the last 168 hours. No results for input(s): AMMONIA in the last 168 hours. Coagulation Profile: No results for input(s): INR, PROTIME in the last 168 hours. Cardiac Enzymes: No results for input(s): CKTOTAL, CKMB, CKMBINDEX, TROPONINI in the last 168 hours. BNP (last 3 results) No results for input(s): PROBNP in the last 8760 hours. HbA1C: No results for input(s): HGBA1C in the last 72 hours. CBG: No results for input(s): GLUCAP in the last 168 hours. Lipid Profile: No results for input(s): CHOL, HDL, LDLCALC, TRIG, CHOLHDL, LDLDIRECT in  the last 72 hours. Thyroid Function Tests: No results for input(s): TSH, T4TOTAL, FREET4, T3FREE, THYROIDAB in the last 72 hours. Anemia Panel: No results for input(s): VITAMINB12, FOLATE, FERRITIN, TIBC, IRON, RETICCTPCT in the last 72 hours. Urine analysis:    Component Value Date/Time   COLORURINE YELLOW 09/10/2017 [place] 09/10/2017 1657   LABSPEC 1.019 09/10/2017 1657   PHURINE 6.0 09/10/2017 1657   GLUCOSEU NEGATIVE 09/10/2017 1657   HGBUR NEGATIVE 09/10/2017 1657   [place] 09/10/2017 1657   KETONESUR NEGATIVE 09/10/2017 1657   PROTEINUR NEGATIVE 09/10/2017 1657   UROBILINOGEN 0.2 05/16/2012 0847   NITRITE NEGATIVE 09/10/2017 1657   LEUKOCYTESUR NEGATIVE 09/10/2017 1657   Sepsis Labs: @LABRCNTIP (procalcitonin:4,lacticidven:4)  ) Recent Results (from the past 240 hour(s))  Culture, blood (routine x 2)     Status: None   Collection Time: 09/16/17  9:44 AM  Result Value Ref Range Status   Specimen Description BLOOD RIGHT HAND  Final  Special Requests   Final    BOTTLES DRAWN AEROBIC AND ANAEROBIC Blood Culture results may not be optimal due to an inadequate volume of blood received in culture bottles   Culture   Final    NO GROWTH 5 DAYS Performed at [facility], [place] [address], [place], [place] [address]    Report Status 09/21/2017 FINAL  Final  Culture, blood (routine x 2)     Status: None   Collection Time: 09/16/17  9:52 AM  Result Value Ref Range Status   Specimen Description BLOOD LEFT HAND  Final   Special Requests   Final    BOTTLES DRAWN AEROBIC AND ANAEROBIC Blood Culture adequate volume   Culture   Final    NO GROWTH 5 DAYS Performed at [facility], [place] [address], [place], [place] [address]    Report Status 09/21/2017 FINAL  Final  Surgical pcr screen     Status: Abnormal   Collection Time: 09/23/17 12:03 AM  Result Value Ref Range Status   MRSA, PCR NEGATIVE NEGATIVE Final   Staphylococcus aureus  POSITIVE (A) NEGATIVE Final    Comment: (NOTE) The Xpert SA Assay (FDA approved for NASAL specimens in patients 27 years of age and older), is one component of a comprehensive surveillance program. It is not intended to diagnose infection nor to guide or monitor treatment. Performed at [facility], [place] [address] [place], [place] [address]   Aerobic/Anaerobic Culture (surgical/deep wound)     Status: None (Preliminary result)   Collection Time: 09/23/17  2:55 PM  Result Value Ref Range Status   Specimen Description ABSCESS  Final   Special Requests PREPATELLA BURSA  Final   Gram Stain   Final    FEW WBC PRESENT, PREDOMINANTLY PMN NO ORGANISMS SEEN    Culture   Final    NO GROWTH 1 [place] Performed at [facility], [place] [address] [place], [place] [address]    Report Status PENDING  Incomplete      Studies: No results found.  Scheduled Meds: . buprenorphine-naloxone  1 tablet Sublingual BID  . enoxaparin (LOVENOX) injection  70 mg Subcutaneous BID  . famotidine  20 mg Oral Daily  . feeding supplement (ENSURE ENLIVE)  237 mL Oral TID BM  . ferrous sulfate  325 mg Oral BID WC  . nicotine  14 mg Transdermal Daily  . sertraline  50 mg Oral Daily    Continuous Infusions: . sodium chloride 1,000 mL (09/23/17 0655)  .  ceFAZolin (ANCEF) IV Stopped (09/24/17 0746)  . lactated ringers 10 mL/hr at 09/23/17 1329     LOS: 13 days     Alma Friendly, MD [facility]   If 7PM-7AM, please contact night-coverage [email or web address] Password [email or web address] 09/24/2017, 2:10 PM

## 2017-09-25 ENCOUNTER — Inpatient Hospital Stay: Payer: Self-pay

## 2017-09-25 ENCOUNTER — Inpatient Hospital Stay (HOSPITAL_COMMUNITY): Payer: Self-pay

## 2017-09-25 LAB — CBC WITH DIFFERENTIAL/PLATELET
Basophils Absolute: 0.1 10*3/uL (ref 0.0–0.1)
Basophils Relative: 1 %
EOS PCT: 2 %
Eosinophils Absolute: 0.1 10*3/uL (ref 0.0–0.7)
HCT: 29.3 % — ABNORMAL LOW (ref 39.0–52.0)
Hemoglobin: 9.7 g/dL — ABNORMAL LOW (ref 13.0–17.0)
LYMPHS ABS: 2.3 10*3/uL (ref 0.7–4.0)
LYMPHS PCT: 33 %
MCH: 31.7 pg (ref 26.0–34.0)
MCHC: 33.1 g/dL (ref 30.0–36.0)
MCV: 95.8 fL (ref 78.0–100.0)
MONOS PCT: 7 %
Monocytes Absolute: 0.5 10*3/uL (ref 0.1–1.0)
NEUTROS PCT: 57 %
Neutro Abs: 4 10*3/uL (ref 1.7–7.7)
Platelets: 306 10*3/uL (ref 150–400)
RBC: 3.06 MIL/uL — AB (ref 4.22–5.81)
RDW: 13.1 % (ref 11.5–15.5)
WBC: 7 10*3/uL (ref 4.0–10.5)

## 2017-09-25 LAB — BASIC METABOLIC PANEL
Anion gap: 10 (ref 5–15)
BUN: 9 mg/dL (ref 6–20)
CHLORIDE: 95 mmol/L — AB (ref 101–111)
CO2: 26 mmol/L (ref 22–32)
Calcium: 8.4 mg/dL — ABNORMAL LOW (ref 8.9–10.3)
Creatinine, Ser: 0.62 mg/dL (ref 0.61–1.24)
GFR calc Af Amer: >60 mL/min (ref >60)
GFR calc non Af Amer: >60 mL/min (ref >60)
Glucose, Bld: 104 mg/dL — ABNORMAL HIGH (ref 65–99)
POTASSIUM: 4 mmol/L (ref 3.5–5.1)
Sodium: 131 mmol/L — ABNORMAL LOW (ref 135–145)

## 2017-09-25 MED ORDER — SODIUM CHLORIDE 0.9% FLUSH
10.0000 mL | INTRAVENOUS | Status: DC | PRN
  Administered 2017-09-29: 10 mL
  Administered 2017-09-30: 20 mL
  Administered 2017-10-12: 10 mL
  Filled 2017-09-25 (×3): qty 40

## 2017-09-25 MED ORDER — IOPAMIDOL (ISOVUE-370) INJECTION 76%
INTRAVENOUS | Status: AC
  Administered 2017-09-25: 100 mL via INTRAVENOUS
  Filled 2017-09-25: qty 100

## 2017-09-25 NOTE — Progress Notes (Signed)
PROGRESS NOTE  Gary Hicks EXH:[id number] DOB: 1985/08/13 DOA: 09/11/2017 PCP: Patient, [name] Per  HPI/Recap of past 76 hours: 32 year old male, active IVDA, who presents with generalized pain. Patient was incarcerated and released 3-4 weeks ago. Patient admits using IV methamphetamine as well as heroin in the past 24 hours prior to admission. Reports global polyarthralgias including his bilateral feet bilateral knees bilateral hips and shoulders. Also reports fevers and chills generalized myalgias. Pt admitted for further management. 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 Pt was also diagnosed with DVT Right gastrocnemius vein . He was started on Lovenox 2/18.  Today, pt reported mild post op pain, improving. Denies any chest pain, SOB, abdominal pain, fever/chills.    Assessment/Plan: Principal Problem:   Anxiety Active Problems:   Heroin abuse (HCC)   Polysubstance abuse (HCC)   Severe sepsis ([place])   Bacteremia due to Staphylococcus aureus   Cigarette smoker   Hypokalemia   DIC (disseminated intravascular coagulation) (HCC)   Staphylococcal arthritis of right shoulder (HCC)   Septic infrapatellar bursitis of left knee   Staphylococcal arthritis of left hip (HCC)   Staphylococcal arthritis of right hip (HCC)   Endocarditis of tricuspid valve   Calf abscess   Pain   Pyomyositis   Saphenous vein thrombophlebitis, right  Tricuspid valve endocarditis with MSSA bacteremia Currently afebrile, no leukocytosis Blood culture 2/11 grew MSSA, repeat on 2/15 NGTD ECHO showed: Tricuspid valve: There was a large, 1.1 cm (W) x 1.8 cm (L), multilobulated, highly mobile vegetation on the right ventricular aspect of the anterior leaflet. 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 MRI Right shoulder: Mild supraspinatus and infraspinatus muscle edema suggestive of Myositis MRI brain negative for abscess MRI C spine: Motion degraded exam. C2-3 through C7-T1 no bulge, stenosis or foraminal narrowing. Mild C3-4 through C5-6 facet arthropathy MRI R femur: currently pending as pt refused Doppler left arm: Negative for DVT  IVDA  Dr Damita Dunnings managing Suboxone BID  No high dose IV benzo unless required for seizure  [facility] Psych consulted: start zoloft 50 mg daily May continue Klonopin prn for 2 weeks and wean off, not advised for long term  Please refer to note dated 2/22 by psych attending for more info  Seizures Likely related to drug use Seizure precautions PRN Ativan MRI negative  Iron def anemia Continue PO iron Monitor hb on lovenox  DVT right Gastrocnemius vein Also superficial saphenous thrombophlebitis.  Continue Lovenox per pharmacy Monitor platelet and CBC  Thrombocytopenia Resolved  Hyponatremia IV fluids Daily BMP   Hepatitis c antibody positive ID on board   Code Status: Full  Family Communication: None at bedside  Disposition Plan: TBD   Consultants:  ID  Orthopedics  Procedures:  None  Antimicrobials:  IV Ancef  DVT prophylaxis:  Therapeutic lovenox   Objective: Vitals:   09/24/17 0438 09/24/17 2049 09/25/17 0434 09/25/17 1415  BP: 109/72 107/72 (!) 116/58 97/71  Pulse: (!) 119 (!) 109 (!) 119 (!) 114  Resp: 16 17 17 20   Temp: 97.9 F (36.6 C) 98.3 F (36.8 C) 98.3  F (36.8 C) 99 F (37.2 C)  TempSrc: Oral Oral Oral Oral  SpO2: 95% 97% 94% 96%  Weight:      Height:        Intake/Output Summary (Last 24 hours) at 09/25/2017 1524 Last data filed at 09/25/2017 1300 Gross per  24 hour  Intake 2755 ml  Output 1750 ml  Net 1005 ml   Filed Weights   09/21/17 2056 09/22/17 2110 09/23/17 1328  Weight: 73.1 kg (161 lb 1.1 oz) 74 kg (163 lb 2.3 oz) 74 kg (163 lb 2.3 oz)    Exam:   General:  NAD, malnourished  Cardiovascular: S1, S2, RRR  Respiratory: Chest clear B/L   Abdomen: Soft, NT, ND, BS present  Musculoskeletal: L knee with ace wrap, TTP   Skin: Numerous tattoos noted  Psychiatry: Fair mood   Data Reviewed: CBC: Recent Labs  Lab 09/20/17 0823 09/21/17 0847 09/22/17 0426 09/23/17 0500 09/25/17 0719  WBC 8.2 7.0 5.9 7.9 7.0  NEUTROABS  --   --  3.4 5.1 4.0  HGB 9.9* 10.3* 10.8* 11.8* 9.7*  HCT 29.6* 31.2* 32.6* 35.0* 29.3*  MCV 95.2 96.3 94.8 94.6 95.8  PLT 331 317 326 392 315   Basic Metabolic Panel: Recent Labs  Lab 09/20/17 0823 09/21/17 0847 09/22/17 0426 09/23/17 0500 09/25/17 0719  NA 132* 129* 129* 134* 131*  K 4.0 4.3 4.5 4.6 4.0  CL 98* 94* 96* 96* 95*  CO2 24 24 23 26 26   GLUCOSE 106* 96 92 98 104*  BUN 7 8 9 10 9   CREATININE 0.69 0.57* 0.57* 0.66 0.62  CALCIUM 8.1* 8.2* 8.2* 8.9 8.4*   GFR: Estimated Creatinine Clearance: 133.8 mL/min (by C-G formula based on SCr of 0.62 mg/dL). Liver Function Tests: No results for input(s): AST, ALT, ALKPHOS, BILITOT, PROT, ALBUMIN in the last 168 hours. No results for input(s): LIPASE, AMYLASE in the last 168 hours. No results for input(s): AMMONIA in the last 168 hours. Coagulation Profile: No results for input(s): INR, PROTIME in the last 168 hours. Cardiac Enzymes: No results for input(s): CKTOTAL, CKMB, CKMBINDEX, TROPONINI in the last 168 hours. BNP (last 3 results) No results for input(s): PROBNP in the last 8760 hours. HbA1C: No results for input(s): HGBA1C in the last 72 hours. CBG: No results for input(s): GLUCAP in the last 168 hours. Lipid Profile: No results for input(s): CHOL, HDL, LDLCALC, TRIG, CHOLHDL, LDLDIRECT in the last 72 hours. Thyroid Function  Tests: No results for input(s): TSH, T4TOTAL, FREET4, T3FREE, THYROIDAB in the last 72 hours. Anemia Panel: No results for input(s): VITAMINB12, FOLATE, FERRITIN, TIBC, IRON, RETICCTPCT in the last 72 hours. Urine analysis:    Component Value Date/Time   COLORURINE YELLOW 09/10/2017 1657   APPEARANCEUR CLEAR 09/10/2017 1657   LABSPEC 1.019 09/10/2017 1657   PHURINE 6.0 09/10/2017 1657   GLUCOSEU NEGATIVE 09/10/2017 1657   HGBUR NEGATIVE 09/10/2017 1657   [place] 09/10/2017 1657   KETONESUR NEGATIVE 09/10/2017 1657   PROTEINUR NEGATIVE 09/10/2017 1657   UROBILINOGEN 0.2 05/16/2012 0847   NITRITE NEGATIVE 09/10/2017 1657   LEUKOCYTESUR NEGATIVE 09/10/2017 1657   Sepsis Labs: @LABRCNTIP (procalcitonin:4,lacticidven:4)  ) Recent Results (from the past 240 hour(s))  Culture, blood (routine x 2)     Status: None   Collection Time: 09/16/17  9:44 AM  Result Value Ref Range Status   Specimen Description BLOOD RIGHT HAND  Final   Special Requests   Final    BOTTLES DRAWN AEROBIC AND ANAEROBIC Blood  Culture results may not be optimal due to an inadequate volume of blood received in culture bottles   Culture   Final    NO GROWTH 5 DAYS Performed at [facility], [place] [address] [place], [place] [address]    Report Status 09/21/2017 FINAL  Final  Culture, blood (routine x 2)     Status: None   Collection Time: 09/16/17  9:52 AM  Result Value Ref Range Status   Specimen Description BLOOD LEFT HAND  Final   Special Requests   Final    BOTTLES DRAWN AEROBIC AND ANAEROBIC Blood Culture adequate volume   Culture   Final    NO GROWTH 5 DAYS Performed at [facility], [place] [address] [place], [place] [address]    Report Status 09/21/2017 FINAL  Final  Surgical pcr screen     Status: Abnormal   Collection Time: 09/23/17 12:03 AM  Result Value Ref Range Status   MRSA, PCR NEGATIVE NEGATIVE Final   Staphylococcus aureus POSITIVE (A) NEGATIVE Final    Comment:  (NOTE) The Xpert SA Assay (FDA approved for NASAL specimens in patients 55 years of age and older), is one component of a comprehensive surveillance program. It is not intended to diagnose infection nor to guide or monitor treatment. Performed at [facility], [place] [address] [place], [place] [address]   Aerobic/Anaerobic Culture (surgical/deep wound)     Status: None (Preliminary result)   Collection Time: 09/23/17  2:55 PM  Result Value Ref Range Status   Specimen Description ABSCESS  Final   Special Requests PREPATELLA BURSA  Final   Gram Stain   Final    FEW WBC PRESENT, PREDOMINANTLY PMN NO ORGANISMS SEEN    Culture   Final    NO GROWTH 1 Rick Performed at [facility], [place] [address] [place], [place] [address]    Report Status PENDING  Incomplete      Studies: No results found.  Scheduled Meds: . buprenorphine-naloxone  1 tablet Sublingual BID  . enoxaparin (LOVENOX) injection  70 mg Subcutaneous BID  . famotidine  20 mg Oral Daily  . feeding supplement (ENSURE ENLIVE)  237 mL Oral TID BM  . ferrous sulfate  325 mg Oral BID WC  . nicotine  14 mg Transdermal Daily  . sertraline  50 mg Oral Daily    Continuous Infusions: . sodium chloride 75 mL/hr at 09/24/17 2239  .  ceFAZolin (ANCEF) IV 2 g (09/25/17 1350)  . lactated ringers 10 mL/hr at 09/23/17 1329     LOS: 14 days     Alma Friendly, MD [facility]   If 7PM-7AM, please contact night-coverage [email or web address] Password [facility] 09/25/2017, 3:24 PM

## 2017-09-25 NOTE — Progress Notes (Signed)
Peripherally Inserted Central Catheter/Midline Placement  The IV Nurse has discussed with the patient and/or persons authorized to consent for the patient, the purpose of this procedure and the potential benefits and risks involved with this procedure.  The benefits include less needle sticks, lab draws from the catheter, and the patient may be discharged home with the catheter. Risks include, but not limited to, infection, bleeding, blood clot (thrombus formation), and puncture of an artery; nerve damage and irregular heartbeat and possibility to perform a PICC exchange if needed/ordered by physician.  Alternatives to this procedure were also discussed.  Bard Power PICC patient education guide, fact sheet on infection prevention and patient information card has been provided to patient /or left at bedside.    PICC/Midline Placement Documentation  PICC Single Lumen 43/32/95 PICC Right Basilic 36 cm 0 cm (Active)  Indication for Insertion or Continuance of Line Prolonged intravenous therapies 09/25/2017  7:00 PM  Exposed Catheter (cm) 0 cm 09/25/2017  7:00 PM  Site Assessment Clean;Dry;Intact 09/25/2017  7:00 PM  Line Status Flushed;Blood return noted;Saline locked 09/25/2017  7:00 PM  Dressing Type Transparent;Occlusive 09/25/2017  7:00 PM  Dressing Status Clean;Dry;Intact 09/25/2017  7:00 PM  Line Care Connections checked and tightened 09/25/2017  7:00 PM  Line Adjustment (NICU/IV Team Only) No 09/25/2017  7:00 PM  Dressing Intervention New dressing 09/25/2017  7:00 PM  Dressing Change Due 10/02/17 09/25/2017  7:00 PM       Gary Hicks 09/25/2017, 7:07 PM

## 2017-09-25 NOTE — Progress Notes (Signed)
    [facility] for Infectious Disease    Date of Admission:  09/11/2017      ID: Gary Hicks is a 32 y.o. male with disseminated MSSA bacteremia, TV endocarditis, right shoulder septic arthritis, +/- left knee septic bursitis washed out on 2/22 Principal Problem:   Anxiety Active Problems:   Heroin abuse (HCC)   Polysubstance abuse (HCC)   Severe sepsis (HCC)   Bacteremia due to Staphylococcus aureus   Cigarette smoker   Hypokalemia   DIC (disseminated intravascular coagulation) ([place])   Staphylococcal arthritis of right shoulder (HCC)   Septic infrapatellar bursitis of left knee   Staphylococcal arthritis of left hip (HCC)   Staphylococcal arthritis of right hip (HCC)   Endocarditis of tricuspid valve   Calf abscess   Pain   Pyomyositis   Saphenous vein thrombophlebitis, right    Subjective: Afebrile, still having some right knee pain, also reports pleurisy with deep inspiration Medications:  . buprenorphine-naloxone  1 tablet Sublingual BID  . enoxaparin (LOVENOX) injection  70 mg Subcutaneous BID  . famotidine  20 mg Oral Daily  . feeding supplement (ENSURE ENLIVE)  237 mL Oral TID BM  . ferrous sulfate  325 mg Oral BID WC  . nicotine  14 mg Transdermal Daily  . sertraline  50 mg Oral Daily    Objective: Vital signs in last 24 hours: Temp:  [98.3 F (36.8 C)] 98.3 F (36.8 C) (02/24 0434) Pulse Rate:  [109-119] 119 (02/24 0434) Resp:  [17] 17 (02/24 0434) BP: (107-116)/(58-72) 116/58 (02/24 0434) SpO2:  [94 %-97 %] 94 % (02/24 0434) Physical Exam  Constitutional: He is oriented to person, place, and time. He appears chronically ill, undernourished, numerous tattoos. No distress.  HENT:  Mouth/Throat: Oropharynx is clear and moist. No oropharyngeal exudate.  Cardiovascular: tachycardia regular rhythm and normal heart sounds. Exam reveals no gallop and no friction rub.  No murmur heard.  Pulmonary/Chest: Effort normal and breath sounds normal. No respiratory  distress. He has no wheezes. Decrease breath sounds at bases Abdominal: Soft. Bowel sounds are normal. He exhibits no distension. There is no tenderness.  Lymphadenopathy:  He has no cervical adenopathy.  Neurological: He is alert and oriented to person, place, and time.  Skin: Skin is warm and dry. No rash noted. No erythema.  Psychiatric: He has a normal mood and affect. His behavior is normal.     Lab Results Recent Labs    09/23/17 0500 09/25/17 0719  WBC 7.9 7.0  HGB 11.8* 9.7*  HCT 35.0* 29.3*  NA 134* 131*  K 4.6 4.0  CL 96* 95*  CO2 26 26  BUN 10 9  CREATININE 0.66 0.62   Microbiology: 2/22 prepatellar fluid - ngtd 2/15 blood cx ngtd 2/12 blood cx MSSA Studies/Results: No results found.   Assessment/Plan:  32 y.o. male with history of recent and recurrent IV drug use admitted with MSSA bacteremia and metastatic infection including tricuspid valve endocarditis, septic left knee bursitis, septic thrombophlebitis of the saphenous vein.   PLAN: 1. Appreciate Ortho involvement and debridement of left knee. Cultures negative thus far 2. Can continue on current PIV or can do picc line to finish out iv abtx while in the hospital 3. Anxiety = appreciate psychiatry input, on zoloft  4. Opiate dependence = continue on [facility] for Infectious Diseases Cell: [phone] Pager: [phone]  09/25/2017, 1:51 PM

## 2017-09-26 ENCOUNTER — Encounter (HOSPITAL_COMMUNITY): Payer: Self-pay | Admitting: Orthopedic Surgery

## 2017-09-26 LAB — CBC WITH DIFFERENTIAL/PLATELET
BASOS ABS: 0.1 10*3/uL (ref 0.0–0.1)
BASOS PCT: 1 %
EOS ABS: 0.2 10*3/uL (ref 0.0–0.7)
EOS PCT: 2 %
HCT: 30.3 % — ABNORMAL LOW (ref 39.0–52.0)
Hemoglobin: 10 g/dL — ABNORMAL LOW (ref 13.0–17.0)
Lymphocytes Relative: 21 %
Lymphs Abs: 1.5 10*3/uL (ref 0.7–4.0)
MCH: 31.6 pg (ref 26.0–34.0)
MCHC: 33 g/dL (ref 30.0–36.0)
MCV: 95.9 fL (ref 78.0–100.0)
MONO ABS: 0.6 10*3/uL (ref 0.1–1.0)
Monocytes Relative: 8 %
NEUTROS ABS: 4.9 10*3/uL (ref 1.7–7.7)
Neutrophils Relative %: 68 %
PLATELETS: 314 10*3/uL (ref 150–400)
RBC: 3.16 MIL/uL — ABNORMAL LOW (ref 4.22–5.81)
RDW: 13.2 % (ref 11.5–15.5)
WBC: 7.2 10*3/uL (ref 4.0–10.5)

## 2017-09-26 LAB — BASIC METABOLIC PANEL
Anion gap: 10 (ref 5–15)
BUN: 9 mg/dL (ref 6–20)
CALCIUM: 8.8 mg/dL — AB (ref 8.9–10.3)
CO2: 29 mmol/L (ref 22–32)
Chloride: 96 mmol/L — ABNORMAL LOW (ref 101–111)
Creatinine, Ser: 0.63 mg/dL (ref 0.61–1.24)
Glucose, Bld: 111 mg/dL — ABNORMAL HIGH (ref 65–99)
Potassium: 3.9 mmol/L (ref 3.5–5.1)
SODIUM: 135 mmol/L (ref 135–145)

## 2017-09-26 NOTE — Progress Notes (Signed)
[facility] for Infectious Disease  Date of Admission:  09/11/2017     Total days of antibiotics 15   Ancef 09/12/17  Dose of Oritavancin 09/15/17         Patient ID: Gary Hicks is a 32 y.o. male admitted with MSSA bacteremia, TV endocarditis, right shoulder septic arthritis, left knee septic bursitis (I&D 2/22).  Principal Problem:   Bacteremia due to Staphylococcus aureus Active Problems:   Staphylococcal arthritis of right shoulder (HCC)   Septic infrapatellar bursitis of left knee   Staphylococcal arthritis of left hip (HCC)   Staphylococcal arthritis of right hip (HCC)   Endocarditis of tricuspid valve   Pyomyositis   Saphenous vein thrombophlebitis, right   Heroin abuse (HCC)   Polysubstance abuse (HCC)   Severe sepsis (HCC)   Cigarette smoker   Hypokalemia   DIC (disseminated intravascular coagulation) ([place])   Calf abscess   Pain   Anxiety   . buprenorphine-naloxone  1 tablet Sublingual BID  . enoxaparin (LOVENOX) injection  70 mg Subcutaneous BID  . famotidine  20 mg Oral Daily  . feeding supplement (ENSURE ENLIVE)  237 mL Oral TID BM  . ferrous sulfate  325 mg Oral BID WC  . nicotine  14 mg Transdermal Daily  . sertraline  50 mg Oral Daily    INTERVAL HX: No changes over the weekend. Sleeping upon walking into the room but woke briefly. No fevers and normal wbc count.   Allergies  Allergen Reactions  . Prednisone Hives  . Amoxicillin Hives    OBJECTIVE: Vitals:   09/25/17 1739 09/25/17 2104 09/26/17 0533 09/26/17 0751  BP: 101/63 108/77 107/62 110/68  Pulse: (!) 112 (!) 114 (!) 113 (!) 110  Resp: 18 (!) 114 15 16  Temp: 98.8 F (37.1 C) 98.2 F (36.8 C) 98.5 F (36.9 C) 98.4 F (36.9 C)  TempSrc: Oral Oral Oral Oral  SpO2: 98% 95% 95% 97%  Weight:      Height:       Body mass index is 24.09 kg/m.  Physical Exam  Constitutional: He is oriented to person, place, and time.  Sleepy but no complaints presently.   HENT:    Mouth/Throat: Oropharynx is clear and moist. No oral lesions. Normal dentition. No dental caries.  Eyes: No scleral icterus.  Cardiovascular: Regular rhythm and normal heart sounds. Tachycardia present.  No murmur heard. Pulmonary/Chest: Effort normal and breath sounds normal.  Abdominal: Soft. He exhibits no distension. There is no tenderness.  Lymphadenopathy:    He has no cervical adenopathy.  Neurological: He is alert and oriented to person, place, and time.  Skin: Skin is warm and dry. No rash noted.  Vitals reviewed. PICC RUE - clean / dry / intact   Lab Results Lab Results  Component Value Date   WBC 7.2 09/26/2017   HGB 10.0 (L) 09/26/2017   HCT 30.3 (L) 09/26/2017   MCV 95.9 09/26/2017   PLT 314 09/26/2017    Lab Results  Component Value Date   CREATININE 0.63 09/26/2017   BUN 9 09/26/2017   NA 135 09/26/2017   K 3.9 09/26/2017   CL 96 (L) 09/26/2017   CO2 29 09/26/2017    Lab Results  Component Value Date   ALT 46 09/14/2017   AST 97 (H) 09/14/2017   ALKPHOS 91 09/14/2017   BILITOT 0.7 09/14/2017     Microbiology: BCx 2/09 >> 2/2 MSSA  BCx 2/10>> 1/2 MSSA  BCx 2/12 >> 2/2 MSSA BCx 2/15 >> NG x 4d   TTE 2/11 >> large 1.1 cm x 1.8 cm  Vegetation and moderate to severe regurgitation   [place] R Leg 2/18 >>saphenous vein thrombosis in lower right thigh  MRI R Shoulder >> Mild supraspinatus and infraspinatus muscle edema suggestive of Myositis.  Mild subacromial/subdeltoid bursitis.  MR Left Knee 2/19 >> 4.8 x 0.8 x 4 cm fluid collection in subcutaneous fat anterior to patellar tendon with severe surrounding soft tissue edema concerning for septic bursitis.   MRI Head > neg for abscess   MRI C Spine > Motion degraded exam. C2-3 through C7-T1 no bulge, stenosis or foraminal narrowing. Mild C3-4 through C5-6 facet arthropathy. Early degenerative changes of the cervical spine without canal stenosis or neural foraminal narrowing.     ASSESSMENT: 32 y.o. male  with history of recent and recurrent IV drug use admitted with MSSA bacteremia and metastatic infection including tricuspid valve endocarditis, septic left knee bursitis, septic thrombophlebitis of the saphenous vein.   PLAN: MSSA Bacteremia with endocarditis, septic arthritis/thrombophlebitis/bursitis of multiple sites =  Blood cultures cleared as of 2/15. S/P wash out of knee 2/22. Remains afebrile with normal WBC count. Would continue Ancef for 6 weeks from 2/23 with end date April 6th. Not candidate for SNF with suboxone use. PICC in place.  I am not certain he will stay the whole time but hopefully he will allow [place] to treat him for another 2 weeks with IV and transition to something by mouth for prolonged therapy.   Heroin use/Opoid Use Disorder =  On suboxone therapy currently. Appreciate Dr. Evette Doffing and his team with management. Will need outpatient appointment set up once discharged for maintenance.   Anxiety =  Appreciate psych input. Zoloft restarted and on low dose benzo with hydroxyzine.   Malnutrition / Deconditioning = Not taking much in from what is recorded in chart. May benefit from nutritional eval. He keeps refusing to work with physical therapy, unfortunately.     Janene Madeira, MSN, NP-C [facility] for Infectious [place] Cell: [phone] Pager: [phone]  09/26/2017  11:20 AM

## 2017-09-26 NOTE — Anesthesia Postprocedure Evaluation (Signed)
Anesthesia Post Note  Patient: Gary Hicks  Procedure(s) Performed: IRRIGATION AND DEBRIDEMENT KNEE (Left Knee)     Patient location during evaluation: PACU Anesthesia Type: General Level of consciousness: awake and alert Pain management: pain level controlled Vital Signs Assessment: post-procedure vital signs reviewed and stable Respiratory status: spontaneous breathing, nonlabored ventilation, respiratory function stable and patient connected to nasal cannula oxygen Cardiovascular status: blood pressure returned to baseline and stable Postop Assessment: no apparent nausea or vomiting Anesthetic complications: no    Last Vitals:  Vitals:   09/26/17 0533 09/26/17 0751  BP: 107/62 110/68  Pulse: (!) 113 (!) 110  Resp: 15 16  Temp: 36.9 C 36.9 C  SpO2: 95% 97%    Last Pain:  Vitals:   09/26/17 0751  TempSrc: Oral  PainSc:                  [name]

## 2017-09-26 NOTE — Op Note (Signed)
09/23/2017  8:33 AM  PATIENT:  Gary Hicks    PRE-OPERATIVE DIAGNOSIS:  left knee septic bursitis  POST-OPERATIVE DIAGNOSIS:  Same  PROCEDURE:  IRRIGATION AND DEBRIDEMENT KNEE  SURGEON:  Delquan Poucher, Ernesta Amble, MD  ASSISTANT: Roxan Hockey, PA-C, he was present and scrubbed throughout the case, critical for completion in a timely fashion, and for retraction, instrumentation, and closure.   ANESTHESIA:   gen  PREOPERATIVE INDICATIONS:  [name] is a  32 y.o. male with a diagnosis of left knee septic bursitis who failed conservative measures and elected for surgical management.    The risks benefits and alternatives were discussed with the patient preoperatively including but not limited to the risks of infection, bleeding, nerve injury, cardiopulmonary complications, the need for revision surgery, among others, and the patient was willing to proceed.  OPERATIVE IMPLANTS: none  OPERATIVE FINDINGS: purulent fluid minimal  BLOOD LOSS: min  COMPLICATIONS: none  TOURNIQUET TIME: none  OPERATIVE PROCEDURE:  Patient was identified in the preoperative holding area and site was marked by me He was transported to the operating theater and placed on the table in supine position taking care to pad all bony prominences. After a preincinduction time out anesthesia was induced. The left lower extremity was prepped and draped in normal sterile fashion and a pre-incision timeout was performed. He received ancef for preoperative antibiotics.   Made a roughly 8 cm incision over his infected bursa.  Drains minimal amount of purulent fluid I collected culture and sent it to the lab.  I performed an excisional debridement of any devitalized tissue using scissors.  I performed a thorough irrigation.  I performed a complete bursectomy.  Next I performed a complex closure of his infected bursa.  A sterile dressing was then applied he was awoken and taken the PACU in stable condition  POST OPERATIVE  PLAN: continue abx per primary, WBAT, dvt px per primary

## 2017-09-26 NOTE — Progress Notes (Signed)
PT Cancellation Note  Patient Details Name: Decklan Mau Fesler MRN: [id number] DOB: [birth year]   Cancelled Treatment:    Reason Eval/Treat Not Completed: Patient declined, no reason specified Patient politely refuses PT this morning, stating he did not sleep at all last night, reports he will be happy to try some mobility this PM. Plan to attempt to try back as/if able and schedule allows.    Deniece Ree PT, DPT, CBIS  Supplemental Physical Therapist [facility]   Pager [phone]

## 2017-09-26 NOTE — Progress Notes (Signed)
PROGRESS NOTE  Gary Hicks BPZ:[id number] DOB: [birth year] DOA: 09/11/2017 PCP: Patient, [name] Per  HPI/Recap of past 44 hours: 32 year old male, active IVDA, who presents with generalized pain. Patient was incarcerated and released 3-4 weeks ago. Patient admits using IV methamphetamine as well as heroin in the past 24 hours prior to admission. Reports global polyarthralgias including his bilateral feet bilateral knees bilateral hips and shoulders. Also reports fevers and chills generalized myalgias. Pt admitted for further management. 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 Pt was also diagnosed with DVT Right gastrocnemius vein . He was started on Lovenox 2/18.  Today, pt reported mild post op pain, denies worsening. Denies chest pain, SOB, abdominal pain, fever/chills. Pt with poor appetite, nutrition consulted. Refusing PT    Assessment/Plan: Principal Problem:   Bacteremia due to Staphylococcus aureus Active Problems:   Heroin abuse (HCC)   Polysubstance abuse (HCC)   Severe sepsis (HCC)   Cigarette smoker   Hypokalemia   DIC (disseminated intravascular coagulation) (HCC)   Staphylococcal arthritis of right shoulder (HCC)   Septic infrapatellar bursitis of left knee   Staphylococcal arthritis of left hip (HCC)   Staphylococcal arthritis of right hip (HCC)   Endocarditis of tricuspid valve   Calf abscess   Pain   Pyomyositis   Saphenous vein thrombophlebitis, right   Anxiety  Tricuspid valve endocarditis with MSSA bacteremia Currently afebrile, no leukocytosis Blood culture 2/11 grew MSSA, repeat on 2/15 NGTD ECHO showed: Tricuspid valve: There was a large, 1.1 cm (W) x 1.8 cm (L), multilobulated, highly mobile vegetation on the right ventricular aspect of the anterior leaflet. 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 MRI Right shoulder: Mild supraspinatus and infraspinatus muscle edema suggestive of Myositis MRI brain negative for abscess MRI C spine: Motion degraded exam. C2-3 through C7-T1 no bulge, stenosis or foraminal narrowing. Mild C3-4 through C5-6 facet arthropathy MRI R femur: currently pending as pt refused Doppler left arm: Negative for DVT  IVDA  Dr Damita Dunnings managing Suboxone BID  No high dose IV benzo unless required for seizure  [facility] Psych consulted: start zoloft 50 mg daily May continue Klonopin prn for 2 weeks and wean off, not advised for long term  Please refer to note dated 2/22 by psych attending for more info  Seizures Likely related to drug use Seizure precautions PRN Ativan MRI negative  Iron def anemia Continue PO iron Monitor hb on lovenox  DVT right Gastrocnemius vein Also superficial saphenous thrombophlebitis.  Continue Lovenox per pharmacy Monitor platelet and CBC  Thrombocytopenia Resolved  Hyponatremia Resolved IV fluids Daily BMP   Hepatitis c antibody positive ID on board   Code Status: Full  Family Communication: None at bedside  Disposition Plan: TBD   Consultants:  ID  Orthopedics  Procedures:  None  Antimicrobials:  IV Ancef  DVT prophylaxis:  Therapeutic lovenox   Objective: Vitals:   09/25/17 1739 09/25/17 2104  09/26/17 0533 09/26/17 0751  BP: 101/63 108/77 107/62 110/68  Pulse: (!) 112 (!) 114 (!) 113 (!) 110  Resp: 18 (!) 114 15 16  Temp: 98.8 F (37.1 C) 98.2 F (36.8 C) 98.5 F  (36.9 C) 98.4 F (36.9 C)  TempSrc: Oral Oral Oral Oral  SpO2: 98% 95% 95% 97%  Weight:      Height:        Intake/Output Summary (Last 24 hours) at 09/26/2017 1609 Last data filed at 09/26/2017 1339 Gross per 24 hour  Intake 240 ml  Output 2250 ml  Net -2010 ml   Filed Weights   09/21/17 2056 09/22/17 2110 09/23/17 1328  Weight: 73.1 kg (161 lb 1.1 oz) 74 kg (163 lb 2.3 oz) 74 kg (163 lb 2.3 oz)    Exam:   General:  NAD, malnourished  Cardiovascular: S1, S2, RRR  Respiratory: Chest clear B/L   Abdomen: Soft, NT, ND, BS present  Musculoskeletal: L knee with ace wrap, TTP   Skin: Numerous tattoos noted  Psychiatry: Fair mood   Data Reviewed: CBC: Recent Labs  Lab 09/21/17 0847 09/22/17 0426 09/23/17 0500 09/25/17 0719 09/26/17 0349  WBC 7.0 5.9 7.9 7.0 7.2  NEUTROABS  --  3.4 5.1 4.0 4.9  HGB 10.3* 10.8* 11.8* 9.7* 10.0*  HCT 31.2* 32.6* 35.0* 29.3* 30.3*  MCV 96.3 94.8 94.6 95.8 95.9  PLT 317 326 392 306 350   Basic Metabolic Panel: Recent Labs  Lab 09/21/17 0847 09/22/17 0426 09/23/17 0500 09/25/17 0719 09/26/17 0349  NA 129* 129* 134* 131* 135  K 4.3 4.5 4.6 4.0 3.9  CL 94* 96* 96* 95* 96*  CO2 24 23 26 26 29   GLUCOSE 96 92 98 104* 111*  BUN 8 9 10 9 9   CREATININE 0.57* 0.57* 0.66 0.62 0.63  CALCIUM 8.2* 8.2* 8.9 8.4* 8.8*   GFR: Estimated Creatinine Clearance: 133.8 mL/min (by C-G formula based on SCr of 0.63 mg/dL). Liver Function Tests: No results for input(s): AST, ALT, ALKPHOS, BILITOT, PROT, ALBUMIN in the last 168 hours. No results for input(s): LIPASE, AMYLASE in the last 168 hours. No results for input(s): AMMONIA in the last 168 hours. Coagulation Profile: No results for input(s): INR, PROTIME in the last 168 hours. Cardiac Enzymes: No results for input(s): CKTOTAL, CKMB, CKMBINDEX, TROPONINI in the last 168 hours. BNP (last 3 results) No results for input(s): PROBNP in the last 8760 hours. HbA1C: No results for input(s):  HGBA1C in the last 72 hours. CBG: No results for input(s): GLUCAP in the last 168 hours. Lipid Profile: No results for input(s): CHOL, HDL, LDLCALC, TRIG, CHOLHDL, LDLDIRECT in the last 72 hours. Thyroid Function Tests: No results for input(s): TSH, T4TOTAL, FREET4, T3FREE, THYROIDAB in the last 72 hours. Anemia Panel: No results for input(s): VITAMINB12, FOLATE, FERRITIN, TIBC, IRON, RETICCTPCT in the last 72 hours. Urine analysis:    Component Value Date/Time   COLORURINE YELLOW 09/10/2017 1657   APPEARANCEUR CLEAR 09/10/2017 1657   LABSPEC 1.019 09/10/2017 1657   PHURINE 6.0 09/10/2017 1657   GLUCOSEU NEGATIVE 09/10/2017 1657   HGBUR NEGATIVE 09/10/2017 1657   BILIRUBINUR NEGATIVE 09/10/2017 1657   KETONESUR NEGATIVE 09/10/2017 1657   PROTEINUR NEGATIVE 09/10/2017 1657   UROBILINOGEN 0.2 05/16/2012 0847   NITRITE NEGATIVE 09/10/2017 1657   LEUKOCYTESUR NEGATIVE 09/10/2017 1657   Sepsis Labs: @LABRCNTIP (procalcitonin:4,lacticidven:4)  ) Recent Results (from the past 240 hour(s))  Surgical pcr screen     Status: Abnormal   Collection Time: 09/23/17  12:03 AM  Result Value Ref Range Status   MRSA, PCR NEGATIVE NEGATIVE Final   Staphylococcus aureus POSITIVE (A) NEGATIVE Final    Comment: (NOTE) The Xpert SA Assay (FDA approved for NASAL specimens in patients 50 years of age and older), is one component of a comprehensive surveillance program. It is not intended to diagnose infection nor to guide or monitor treatment. Performed at [facility], [place] [address], [place], [place] [address]   Aerobic/Anaerobic Culture (surgical/deep wound)     Status: None (Preliminary result)   Collection Time: 09/23/17  2:55 PM  Result Value Ref Range Status   Specimen Description ABSCESS  Final   Special Requests PREPATELLA BURSA  Final   Gram Stain   Final    FEW WBC PRESENT, PREDOMINANTLY PMN NO ORGANISMS SEEN    Culture   Final    NO GROWTH 3 DAYS NO ANAEROBES ISOLATED;  CULTURE IN PROGRESS FOR 5 DAYS Performed at [facility], [place] [address] [place], [place] [address]    Report Status PENDING  Incomplete      Studies: Ct Angio Chest Pe W Or Wo Contrast  Result Date: 09/25/2017 CLINICAL DATA:  32 year old male with LEFT LOWER extremity thrombosis. IV drug abuser. Generalized pain. Fever chills and myalgias. EXAM: CT ANGIOGRAPHY CHEST WITH CONTRAST TECHNIQUE: Multidetector CT imaging of the chest was performed using the standard protocol during bolus administration of intravenous contrast. Multiplanar CT image reconstructions and MIPs were obtained to evaluate the vascular anatomy. CONTRAST:  160mL ISOVUE-370 IOPAMIDOL (ISOVUE-370) INJECTION 76% COMPARISON:  06/06/2016 and prior chest radiographs FINDINGS: Cardiovascular: This is a technically satisfactory study. No pulmonary emboli are identified. Old healed UPPER limits normal heart size noted. No evidence of thoracic aortic aneurysm or pericardial effusion. Mediastinum/Nodes: No enlarged mediastinal, hilar, or axillary lymph nodes. Thyroid gland, trachea, and esophagus demonstrate no significant findings. Lungs/Pleura: There are multiple irregular bilateral pulmonary nodules with index lesions as follows: An 8 x 13 mm SUPERIOR segment RIGHT LOWER lobe nodule (6:42) A 4 x 7 mm RIGHT UPPER lobe nodule (6:56) A 12 x 20 mm RIGHT LOWER lobe nodule (6:88) A 28 x 22 mm LEFT LOWER lobe nodule (6:91). These nodules are nonspecific but most likely represent infection or possibly septic emboli. Malignancy/metastatic disease is considered less likely. A small LEFT pleural effusion and LEFT basilar atelectasis noted. Upper Abdomen: No acute abnormality. Musculoskeletal: No acute abnormality or suspicious bony lesion. Review of the MIP images confirms the above findings. IMPRESSION: 1. Multiple bilateral pulmonary nodules as described. Favor infection or possibly septic emboli. Other inflammatory processes or  malignancy/metastatic disease considered less likely. 2. Small LEFT pleural effusion and LEFT basilar atelectasis 3. No evidence of pulmonary emboli or thoracic aortic aneurysm. Electronically Signed   By: Margarette Canada M.D.   On: 09/25/2017 18:42   [place] Ekg Site Rite  Result Date: 09/25/2017 If Site Rite image not attached, placement could not be confirmed due to current cardiac rhythm.   Scheduled Meds: . buprenorphine-naloxone  1 tablet Sublingual BID  . enoxaparin (LOVENOX) injection  70 mg Subcutaneous BID  . famotidine  20 mg Oral Daily  . feeding supplement (ENSURE ENLIVE)  237 mL Oral TID BM  . ferrous sulfate  325 mg Oral BID WC  . nicotine  14 mg Transdermal Daily  . sertraline  50 mg Oral Daily    Continuous Infusions: . sodium chloride 75 mL/hr at 09/25/17 2209  .  ceFAZolin (ANCEF) IV Stopped (09/26/17  4268)  . lactated ringers 10 mL/hr at 09/23/17 1329     LOS: 15 days     Alma Friendly, MD [facility]   If 7PM-7AM, please contact night-coverage [email or web address] Password [facility] 09/26/2017, 4:09 PM

## 2017-09-26 NOTE — Progress Notes (Signed)
[place] for Lovenox Indication: DVT  Allergies  Allergen Reactions  . Prednisone Hives  . Amoxicillin Hives    Patient Measurements: Height: 5\' 9"  (175.3 cm) Weight: 163 lb 2.3 oz (74 kg) IBW/kg (Calculated) : 70.7 Lovenox Dosing Weight: 70 kg  Vital Signs: Temp: 98.4 F (36.9 C) (02/25 0751) Temp Source: Oral (02/25 0751) BP: 110/68 (02/25 0751) Pulse Rate: 110 (02/25 0751)  Labs: Recent Labs    09/25/17 0719 09/26/17 0349  HGB 9.7* 10.0*  HCT 29.3* 30.3*  PLT 306 314  CREATININE 0.62 0.63    Estimated Creatinine Clearance: 133.8 mL/min (by C-G formula based on SCr of 0.63 mg/dL).  Assessment: 65 yoM with history of recurrent IVDU admitted with MSSA bacteremia and metastatic infection including tricuspid valve endocarditis, septic knee bursitis, septic thrombophlebitis of the saphenous vein. Bilateral lower extremity venous duplex shows evidence of acute DVT of the right lower extremity.   H/H: low but stable, PLT wnl  Goal of Therapy:  Monitor platelets by anticoagulation protocol: Yes   Plan:  Lovenox 70 mg SQ q12h Monitor CBC, renal function Monitor for signs/symptoms of bleeding F/U change to an oral agent  Thank you Anette Guarneri, PharmD [phone] 09/26/2017 8:45 AM

## 2017-09-26 NOTE — Progress Notes (Signed)
Nutrition Follow-up  DOCUMENTATION CODES:   Not applicable  INTERVENTION:   Continue Ensure Enlive po TID, each supplement provides 350 kcal and 20 grams of protein  Continue snacks between meals   Magic cup q 24 hours with lunch, each supplement provides 290 kcal and 9 grams of protein   Vital Cuisine q 24 hours with dinner, each supplement provides 520kcal and 22g of protein.   NUTRITION DIAGNOSIS:   Increased nutrient needs related to acute illness as evidenced by estimated needs.  Ongoing   GOAL:   Patient will meet greater than or equal to 90% of their needs  Progressing   MONITOR:   PO intake, Supplement acceptance, Labs, Weight trends  REASON FOR ASSESSMENT:   Malnutrition Screening Tool    ASSESSMENT:   32 yo male admitted severe sepsis with bacterial endocarditis, MSSA bacteremia. Pt with active IV drug abuse (meth and heroin)  Pt s/p irrigation and debridement of L knee. Per chart, pt refusing PT. Pt with continued decreased appetite, consuming 0-85% of meals. Pt enjoys snacks and Ensure supplements. Pt requested cup of ice to consume Ensure at visit. Pt reports only consuming one daily at this time. Discussed the importance of adequate calorie and protein consumption. Pt amenable to trying Magic Cup and [facility], RD to order. Pt reports no nutrition impact symptoms at visit.   Labs and medications reviewed; ferrous sulfate, Pepcid    Diet Order:  Seizure precautions Diet regular Room service appropriate? Yes; Fluid consistency: Thin  EDUCATION NEEDS:   Education needs have been addressed  Skin:  Skin Assessment: Reviewed RN Assessment  Last BM:  Unknown LBM date  Height:   Ht Readings from Last 1 Encounters:  09/23/17 5\' 9"  (1.753 m)   Weight:   Wt Readings from Last 1 Encounters:  09/23/17 163 lb 2.3 oz (74 kg)   Ideal Body Weight:  72.7 kg  BMI:  Body mass index is 24.09 kg/m.  Estimated Nutritional Needs:   Kcal:  2200-2400  kcals  Protein:  110-120 g  Fluid:  >/= 2 L  Parks Ranger, MS, RDN, LDN 09/26/2017 1:25 PM

## 2017-09-26 NOTE — Progress Notes (Signed)
Patient not eating, and refusing PT.  Patient with minimal interaction and has slept the whole [place].

## 2017-09-27 NOTE — Progress Notes (Signed)
Physical Therapy Treatment Patient Details Name: Gary Hicks MRN: [id number] DOB: [birth year] Today's Date: 09/27/2017    History of Present Illness Pt is a 32 y/o male with active IV drug use presenting with generalized pain. Family is concerned about seizures due to finding him in fetal position with abnormal eye movements. He reports global polyarthralgia, use of heroin and methamphetamines in the past 24 hours at time of admission. Diagnosed with staph bacteremia, severe sepsis. PMH bacterial endocarditis, heroin abuse, possible Hepatitis C, opiate abuse, polysubstance abuse, seizures.  Pt also with positive right LE DVT on 2/18.  On Lovenox.      PT Comments    Pt slowly progressing towards functional mobility goals. Pt is most limited by pain in L LE at this time, which is causing him to move extremely slowly. Pt resistant to getting out of bed initially, but then cooperated to perform sitting at EOB with L LE supported/extended. Educated pt on positioning and transfer strategies to help with pain management. Pt sitting EOB for hygiene with washcloth. Able to perform stand step transfer to recliner chair with RW min guard with increased time and increased WB through bil UEs. Current plan remains appropriate. Will continue to follow acutely and progress as tolerated.    Follow Up Recommendations  SNF(pending further mobility progress)     Equipment Recommendations  Rolling walker with 5" wheels    Recommendations for Other Services       Precautions / Restrictions Precautions Precautions: Fall Restrictions Weight Bearing Restrictions: No    Mobility  Bed Mobility Overal bed mobility: Needs Assistance Bed Mobility: Supine to Sit     Supine to sit: Min assist;HOB elevated     General bed mobility comments: min assist to support and assist L LE with pillow, increased time required  Transfers Overall transfer level: Needs assistance Equipment used: Rolling walker (2  wheeled) Transfers: Sit to/from Stand Sit to Stand: Min assist;From elevated surface         General transfer comment: assist with support of LLE. VCs for hand placement. increased time required.  Ambulation/Gait Ambulation/Gait assistance: Min guard Ambulation Distance (Feet): 1 Feet   Gait Pattern/deviations: Antalgic Gait velocity: decreased Gait velocity interpretation: Below normal speed for age/gender General Gait Details: Stand step transfer from bed to recliner chair with RW. Antalgic with decreased WB through LLE. Increased reliance on bil UE through RW for support.    Stairs            Wheelchair Mobility    Modified Rankin (Stroke Patients Only)       Balance Overall balance assessment: Needs assistance Sitting-balance support: Feet supported;Bilateral upper extremity supported Sitting balance-Leahy Scale: Fair     Standing balance support: Bilateral upper extremity supported Standing balance-Leahy Scale: Poor Standing balance comment: reliance on bil UE on RW for balance/support                            Cognition Arousal/Alertness: Awake/alert Behavior During Therapy: WFL for tasks assessed/performed Overall Cognitive Status: Within Functional Limits for tasks assessed                                        Exercises      General Comments General comments (skin integrity, edema, etc.): pt requires continuous motivation throughout session to perform tasks. pt guarding LLE and attempting  to keep knee as straight as possible throughout session.      Pertinent Vitals/Pain Pain Assessment: Faces Faces Pain Scale: Hurts even more Pain Location: (L knee) Pain Descriptors / Indicators: Grimacing;Guarding;Aching;Constant Pain Intervention(s): Monitored during session;Limited activity within patient's tolerance;Repositioned    Home Living                      Prior Function            PT Goals (current  goals can now be found in the care plan section) Acute Rehab PT Goals Patient Stated Goal: to decrease his pain PT Goal Formulation: With patient/family Time For Goal Achievement: 10/01/17 Potential to Achieve Goals: Good Progress towards PT goals: Progressing toward goals    Frequency    Min 3X/week      PT Plan Current plan remains appropriate    Co-evaluation              AM-PAC PT "6 Clicks" Daily Activity  Outcome Measure  Difficulty turning over in bed (including adjusting bedclothes, sheets and blankets)?: Unable Difficulty moving from lying on back to sitting on the side of the bed? : Unable Difficulty sitting down on and standing up from a chair with arms (e.g., wheelchair, bedside commode, etc,.)?: Unable Help needed moving to and from a bed to chair (including a wheelchair)?: A Lot Help needed walking in hospital room?: A Lot Help needed climbing 3-5 steps with a railing? : Total 6 Click Score: 8    End of Session   Activity Tolerance: Patient limited by pain Patient left: in chair;with call bell/phone within reach Nurse Communication: Mobility status PT Visit Diagnosis: Muscle weakness (generalized) (M62.81);Difficulty in walking, not elsewhere classified (R26.2);Pain Pain - Right/Left: Left Pain - part of body: Knee;Leg     Time: 6834-1962 PT Time Calculation (min) (ACUTE ONLY): 26 min  Charges:  $Therapeutic Activity: 23-37 mins                    G Codes:      [name], SPT   [name] 09/27/2017, 1:53 PM

## 2017-09-27 NOTE — Progress Notes (Signed)
PROGRESS NOTE  Gary Hicks DJM:[id number] DOB: [birth year] DOA: 09/11/2017 PCP: Patient, [name] Per  HPI/Recap of past 34 hours: 32 year old male, active IVDA, who presents with generalized pain. Patient was incarcerated and released 3-4 weeks ago. Patient admits using IV methamphetamine as well as heroin in the past 24 hours prior to admission. Reports global polyarthralgias including his bilateral feet bilateral knees bilateral hips and shoulders. Also reports fevers and chills generalized myalgias. Pt admitted for further management. 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 Pt was also diagnosed with DVT Right gastrocnemius vein . He was started on Lovenox 2/18.  Today, pt reported mild post op pain, denies worsening. Denies chest pain, SOB, abdominal pain, fever/chills. Pt with poor appetite, nutrition consulted. Advised pt to participate in PT.    Assessment/Plan: Principal Problem:   Bacteremia due to Staphylococcus aureus Active Problems:   Heroin abuse (HCC)   Polysubstance abuse (HCC)   Severe sepsis (HCC)   Cigarette smoker   Hypokalemia   DIC (disseminated intravascular coagulation) (HCC)   Staphylococcal arthritis of right shoulder (HCC)   Septic infrapatellar bursitis of left knee   Staphylococcal arthritis of left hip (HCC)   Staphylococcal arthritis of right hip (HCC)   Endocarditis of tricuspid valve   Calf abscess   Pain   Pyomyositis   Saphenous vein thrombophlebitis, right   Anxiety  Tricuspid valve endocarditis with MSSA bacteremia Currently afebrile, no leukocytosis Blood culture 2/11 grew MSSA, repeat on 2/15 NGTD ECHO showed: Tricuspid valve: There was a large, 1.1 cm (W) x 1.8 cm (L), multilobulated, highly mobile vegetation on the right ventricular aspect of the anterior leaflet. 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 MRI Right shoulder: Mild supraspinatus and infraspinatus muscle edema suggestive of Myositis MRI brain negative for abscess MRI C spine: Motion degraded exam. C2-3 through C7-T1 no bulge, stenosis or foraminal narrowing. Mild C3-4 through C5-6 facet arthropathy MRI R femur: currently pending as pt refused Doppler left arm: Negative for DVT  IVDA  Dr Damita Dunnings managing Suboxone BID  No high dose IV benzo unless required for seizure  GAD Psych consulted: start zoloft 50 mg daily May continue Klonopin prn for 2 weeks and wean off, not advised for long term  Please refer to note dated 2/22 by psych attending for more info  Seizures Likely related to drug use Seizure precautions PRN Ativan MRI negative  Iron def anemia Continue PO iron Monitor hb on lovenox  DVT right Gastrocnemius vein Also superficial saphenous thrombophlebitis.  Continue Lovenox per pharmacy Monitor platelet and CBC  Thrombocytopenia Resolved  Hyponatremia Resolved Daily BMP   Hepatitis c antibody positive ID on board   Code Status: Full  Family Communication: None at bedside  Disposition Plan: TBD   Consultants:  ID  Orthopedics  Procedures:  I&D of L knee 09/23/17  Antimicrobials:  IV Ancef  DVT prophylaxis:  Therapeutic lovenox   Objective: Vitals:  09/26/17 1649 09/26/17 2118 09/27/17 0502 09/27/17 0900  BP: 114/71 119/84 99/72 112/68  Pulse: (!) 117 (!) 109 (!) 109 (!) 101  Resp: 16 17 18 18   Temp: 98.8 F (37.1 C) 98.5 F (36.9 C) 98.6  F (37 C) 98 F (36.7 C)  TempSrc: Oral Oral Oral Oral  SpO2: 99% 98% 96% 98%  Weight:      Height:        Intake/Output Summary (Last 24 hours) at 09/27/2017 1733 Last data filed at 09/27/2017 1500 Gross per 24 hour  Intake 2920 ml  Output 2000 ml  Net 920 ml   Filed Weights   09/21/17 2056 09/22/17 2110 09/23/17 1328  Weight: 73.1 kg (161 lb 1.1 oz) 74 kg (163 lb 2.3 oz) 74 kg (163 lb 2.3 oz)    Exam:   General:  NAD, malnourished  Cardiovascular: S1, S2, RRR  Respiratory: Chest clear B/L   Abdomen: Soft, NT, ND, BS present  Musculoskeletal: L knee with ace wrap, TTP   Skin: Numerous tattoos noted  Psychiatry: Fair mood   Data Reviewed: CBC: Recent Labs  Lab 09/21/17 0847 09/22/17 0426 09/23/17 0500 09/25/17 0719 09/26/17 0349  WBC 7.0 5.9 7.9 7.0 7.2  NEUTROABS  --  3.4 5.1 4.0 4.9  HGB 10.3* 10.8* 11.8* 9.7* 10.0*  HCT 31.2* 32.6* 35.0* 29.3* 30.3*  MCV 96.3 94.8 94.6 95.8 95.9  PLT 317 326 392 306 500   Basic Metabolic Panel: Recent Labs  Lab 09/21/17 0847 09/22/17 0426 09/23/17 0500 09/25/17 0719 09/26/17 0349  NA 129* 129* 134* 131* 135  K 4.3 4.5 4.6 4.0 3.9  CL 94* 96* 96* 95* 96*  CO2 24 23 26 26 29   GLUCOSE 96 92 98 104* 111*  BUN 8 9 10 9 9   CREATININE 0.57* 0.57* 0.66 0.62 0.63  CALCIUM 8.2* 8.2* 8.9 8.4* 8.8*   GFR: Estimated Creatinine Clearance: 133.8 mL/min (by C-G formula based on SCr of 0.63 mg/dL). Liver Function Tests: No results for input(s): AST, ALT, ALKPHOS, BILITOT, PROT, ALBUMIN in the last 168 hours. No results for input(s): LIPASE, AMYLASE in the last 168 hours. No results for input(s): AMMONIA in the last 168 hours. Coagulation Profile: No results for input(s): INR, PROTIME in the last 168 hours. Cardiac Enzymes: No results for input(s): CKTOTAL, CKMB, CKMBINDEX, TROPONINI in the last 168 hours. BNP (last 3 results) No results for input(s): PROBNP in the last 8760 hours. HbA1C: No results for input(s):  HGBA1C in the last 72 hours. CBG: No results for input(s): GLUCAP in the last 168 hours. Lipid Profile: No results for input(s): CHOL, HDL, LDLCALC, TRIG, CHOLHDL, LDLDIRECT in the last 72 hours. Thyroid Function Tests: No results for input(s): TSH, T4TOTAL, FREET4, T3FREE, THYROIDAB in the last 72 hours. Anemia Panel: No results for input(s): VITAMINB12, FOLATE, FERRITIN, TIBC, IRON, RETICCTPCT in the last 72 hours. Urine analysis:    Component Value Date/Time   COLORURINE YELLOW 09/10/2017 1657   APPEARANCEUR CLEAR 09/10/2017 1657   LABSPEC 1.019 09/10/2017 1657   PHURINE 6.0 09/10/2017 1657   GLUCOSEU NEGATIVE 09/10/2017 1657   HGBUR NEGATIVE 09/10/2017 1657   BILIRUBINUR NEGATIVE 09/10/2017 1657   KETONESUR NEGATIVE 09/10/2017 1657   PROTEINUR NEGATIVE 09/10/2017 1657   UROBILINOGEN 0.2 05/16/2012 0847   NITRITE NEGATIVE 09/10/2017 1657   LEUKOCYTESUR NEGATIVE 09/10/2017 1657   Sepsis Labs: @LABRCNTIP (procalcitonin:4,lacticidven:4)  ) Recent Results (from the past 240 hour(s))  Surgical pcr screen     Status: Abnormal  Collection Time: 09/23/17 12:03 AM  Result Value Ref Range Status   MRSA, PCR NEGATIVE NEGATIVE Final   Staphylococcus aureus POSITIVE (A) NEGATIVE Final    Comment: (NOTE) The Xpert SA Assay (FDA approved for NASAL specimens in patients 69 years of age and older), is one component of a comprehensive surveillance program. It is not intended to diagnose infection nor to guide or monitor treatment. Performed at [facility], [place] [address], [place], [place] [address]   Aerobic/Anaerobic Culture (surgical/deep wound)     Status: None (Preliminary result)   Collection Time: 09/23/17  2:55 PM  Result Value Ref Range Status   Specimen Description ABSCESS  Final   Special Requests PREPATELLA BURSA  Final   Gram Stain   Final    FEW WBC PRESENT, PREDOMINANTLY PMN NO ORGANISMS SEEN    Culture   Final    NO GROWTH 4 DAYS NO ANAEROBES ISOLATED;  CULTURE IN PROGRESS FOR 5 DAYS Performed at [facility], [place] [address], [place], [place] [address]    Report Status PENDING  Incomplete      Studies: No results found.  Scheduled Meds: . buprenorphine-naloxone  1 tablet Sublingual BID  . enoxaparin (LOVENOX) injection  70 mg Subcutaneous BID  . famotidine  20 mg Oral Daily  . feeding supplement (ENSURE ENLIVE)  237 mL Oral TID BM  . ferrous sulfate  325 mg Oral BID WC  . nicotine  14 mg Transdermal Daily  . sertraline  50 mg Oral Daily    Continuous Infusions: . sodium chloride 75 mL/hr at 09/27/17 0501  .  ceFAZolin (ANCEF) IV Stopped (09/27/17 1410)  . lactated ringers 10 mL/hr at 09/23/17 1329     LOS: 16 days     Alma Friendly, MD [facility]   If 7PM-7AM, please contact night-coverage [email or web address] Password [facility] 09/27/2017, 5:33 PM

## 2017-09-27 NOTE — Progress Notes (Signed)
Patient educated on the importance of getting and moving in bed to prevent bed sores and improve circulation.  Sheliah Plane RN

## 2017-09-28 LAB — BASIC METABOLIC PANEL
Anion gap: 8 (ref 5–15)
BUN: 7 mg/dL (ref 6–20)
CALCIUM: 8.7 mg/dL — AB (ref 8.9–10.3)
CO2: 29 mmol/L (ref 22–32)
CREATININE: 0.54 mg/dL — AB (ref 0.61–1.24)
Chloride: 96 mmol/L — ABNORMAL LOW (ref 101–111)
Glucose, Bld: 113 mg/dL — ABNORMAL HIGH (ref 65–99)
Potassium: 3.9 mmol/L (ref 3.5–5.1)
SODIUM: 133 mmol/L — AB (ref 135–145)

## 2017-09-28 LAB — CBC WITH DIFFERENTIAL/PLATELET
BASOS ABS: 0 10*3/uL (ref 0.0–0.1)
BASOS PCT: 0 %
EOS ABS: 0.1 10*3/uL (ref 0.0–0.7)
Eosinophils Relative: 2 %
HCT: 29.1 % — ABNORMAL LOW (ref 39.0–52.0)
Hemoglobin: 9.5 g/dL — ABNORMAL LOW (ref 13.0–17.0)
Lymphocytes Relative: 32 %
Lymphs Abs: 2 10*3/uL (ref 0.7–4.0)
MCH: 31.3 pg (ref 26.0–34.0)
MCHC: 32.6 g/dL (ref 30.0–36.0)
MCV: 95.7 fL (ref 78.0–100.0)
MONO ABS: 0.5 10*3/uL (ref 0.1–1.0)
MONOS PCT: 8 %
Neutro Abs: 3.7 10*3/uL (ref 1.7–7.7)
Neutrophils Relative %: 58 %
PLATELETS: 286 10*3/uL (ref 150–400)
RBC: 3.04 MIL/uL — ABNORMAL LOW (ref 4.22–5.81)
RDW: 13.5 % (ref 11.5–15.5)
WBC: 6.4 10*3/uL (ref 4.0–10.5)

## 2017-09-28 LAB — AEROBIC/ANAEROBIC CULTURE (SURGICAL/DEEP WOUND)

## 2017-09-28 LAB — AEROBIC/ANAEROBIC CULTURE W GRAM STAIN (SURGICAL/DEEP WOUND): Culture: NO GROWTH

## 2017-09-28 MED ORDER — HYDROXYZINE HCL 25 MG PO TABS
25.0000 mg | ORAL_TABLET | Freq: Three times a day (TID) | ORAL | Status: DC | PRN
  Administered 2017-10-04 – 2017-11-01 (×8): 25 mg via ORAL
  Filled 2017-09-28 (×10): qty 1

## 2017-09-28 NOTE — H&P (View-Only) (Signed)
Patient ID: Gary Hicks, male   DOB: 1985/10/29, 32 y.o.   MRN: [id number]   LOS: 17 days   Subjective: Still c/o hip and knee pain, occ severe. Doesn't feel like it's much better.   Objective: Vital signs in last 24 hours: Temp:  [98 F (36.7 C)-98.3 F (36.8 C)] 98.3 F (36.8 C) (02/27 0813) Pulse Rate:  [101-114] 112 (02/27 0813) Resp:  [16-18] 16 (02/27 0813) BP: (101-114)/(65-72) 112/68 (02/27 0813) SpO2:  [96 %-100 %] 98 % (02/27 0813) Last BM Date: 09/23/17   Laboratory  CBC Recent Labs    09/26/17 0349 09/28/17 0402  WBC 7.2 6.4  HGB 10.0* 9.5*  HCT 30.3* 29.1*  PLT 314 286   BMET Recent Labs    09/26/17 0349 09/28/17 0402  NA 135 133*  K 3.9 3.9  CL 96* 96*  CO2 29 29  GLUCOSE 111* 113*  BUN 9 7  CREATININE 0.63 0.54*  CALCIUM 8.8* 8.7*     Physical Exam General appearance: alert and no distress  Left knee -- Dressing removed, incision C/D/I, some superficial erythema superior portion, no discharge, dressing replaced.   Assessment/Plan: Left knee septic bursitis s/p I&D, bursectomy -- Continues to heal as expected. Should f/u with Dr. Percell Miller as OP in ~10d to 2 weeks.    Lisette Abu, PA-C Orthopedic Surgery [phone] 09/28/2017

## 2017-09-28 NOTE — Progress Notes (Signed)
PROGRESS NOTE  Gary Hicks KGY:[id number] DOB: 11-09-1985 DOA: 09/11/2017 PCP: Patient, [name] Per  HPI/Recap of past 60 hours: 32 year old male, active IVDA, who presents with generalized pain. Patient was incarcerated and released 3-4 weeks ago. Patient admits using IV methamphetamine as well as heroin in the past 24 hours prior to admission. Reports global polyarthralgias including his bilateral feet bilateral knees bilateral hips and shoulders. Also reports fevers and chills generalized myalgias. Pt admitted for further management. 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 Pt was also diagnosed with DVT Right gastrocnemius vein . He was started on Lovenox 2/18.     Assessment/Plan: Principal Problem:   Bacteremia due to Staphylococcus aureus Active Problems:   Heroin abuse (HCC)   Polysubstance abuse (HCC)   Severe sepsis (HCC)   Cigarette smoker   Hypokalemia   DIC (disseminated intravascular coagulation) (HCC)   Staphylococcal arthritis of right shoulder (HCC)   Septic infrapatellar bursitis of left knee   Staphylococcal arthritis of left hip (HCC)   Staphylococcal arthritis of right hip (HCC)   Endocarditis of tricuspid valve   Calf abscess   Pain   Pyomyositis   Saphenous vein thrombophlebitis, right   Anxiety  Tricuspid valve endocarditis with MSSA bacteremia Blood culture 2/11 grew MSSA, repeat on 2/15 NGTD ECHO showed: Tricuspid valve: There was a large, 1.1 cm (W) x 1.8 cm (L), multilobulated, highly mobile vegetation on the right ventricular aspect of the anterior leaflet. There was moderate-severe regurgitation Continue IV Ancef for 6 weeks from 2/23-->11/05/16 as per ID ID following Pt will most likely remain in the hospital the entire time as pt in on suboxone and cant go to SNF  Afebrile.   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 MRI Right shoulder: Mild supraspinatus and infraspinatus muscle edema suggestive of Myositis MRI brain negative for abscess MRI C spine: Motion degraded exam. C2-3 through C7-T1 no bulge, stenosis or foraminal narrowing. Mild C3-4 through C5-6 facet arthropathy  MRI R femur: currently pending as pt refused Doppler left arm: Negative for DVT underwent bursectomy and I and D by Dr Percell Miller 2-25.  Ortho to see post op.   IVDA  Dr Damita Dunnings managing Suboxone BID  No high dose IV benzo unless required for seizure  GAD Psych consulted: start zoloft 50 mg daily May continue Klonopin prn for 2 weeks and wean off, not advised for long term.  Please refer to note dated 2/22 by psych attending for more info  Seizures Likely related to drug use Seizure precautions PRN Ativan MRI negative  Iron def anemia Continue PO iron Hb stable.   DVT right Gastrocnemius vein Also superficial saphenous thrombophlebitis.  Continue Lovenox per pharmacy Monitor platelet and CBC  Thrombocytopenia Resolved  Hyponatremia Fluctuates.  Daily BMP   Hepatitis c antibody positive ID on board   Code Status: Full  Family Communication: None at bedside  Disposition Plan: TBD   Consultants:  ID  Orthopedics  Procedures:  I&D of L knee 09/23/17  Antimicrobials:  IV Ancef  DVT prophylaxis:  Therapeutic lovenox   Objective: Vitals:   09/27/17 1800 09/27/17 2130 09/28/17 0512 09/28/17 0813  BP: 114/72 102/71  101/65 112/68  Pulse: (!) 101 (!) 109 (!) 114 (!) 112  Resp: 18 17 18 16   Temp: 98.2 F (36.8 C) 98.2 F (36.8 C) 98 F (36.7 C) 98.3 F (36.8 C)  TempSrc: Oral Oral Oral Oral  SpO2: 98% 100% 96% 98%  Weight:      Height:        Intake/Output  Summary (Last 24 hours) at 09/28/2017 1422 Last data filed at 09/28/2017 9563 Gross per 24 hour  Intake 1940 ml  Output 1500 ml  Net 440 ml   Filed Weights   09/21/17 2056 09/22/17 2110 09/23/17 1328  Weight: 73.1 kg (161 lb 1.1 oz) 74 kg (163 lb 2.3 oz) 74 kg (163 lb 2.3 oz)    Exam:   General:  NAD, thin appearing.   Cardiovascular: S 1, S 2  RRR  Respiratory:  Normal respiratory effort, CTA  Abdomen: BS present, soft, nt  Musculoskeletal: left knee with clean dressing  Skin: no rash   Psychiatry: flat affect.    Data Reviewed: CBC: Recent Labs  Lab 09/22/17 0426 09/23/17 0500 09/25/17 0719 09/26/17 0349 09/28/17 0402  WBC 5.9 7.9 7.0 7.2 6.4  NEUTROABS 3.4 5.1 4.0 4.9 3.7  HGB 10.8* 11.8* 9.7* 10.0* 9.5*  HCT 32.6* 35.0* 29.3* 30.3* 29.1*  MCV 94.8 94.6 95.8 95.9 95.7  PLT 326 392 306 314 875   Basic Metabolic Panel: Recent Labs  Lab 09/22/17 0426 09/23/17 0500 09/25/17 0719 09/26/17 0349 09/28/17 0402  NA 129* 134* 131* 135 133*  K 4.5 4.6 4.0 3.9 3.9  CL 96* 96* 95* 96* 96*  CO2 23 26 26 29 29   GLUCOSE 92 98 104* 111* 113*  BUN 9 10 9 9 7   CREATININE 0.57* 0.66 0.62 0.63 0.54*  CALCIUM 8.2* 8.9 8.4* 8.8* 8.7*   GFR: Estimated Creatinine Clearance: 133.8 mL/min (A) (by C-G formula based on SCr of 0.54 mg/dL (L)). Liver Function Tests: No results for input(s): AST, ALT, ALKPHOS, BILITOT, PROT, ALBUMIN in the last 168 hours. No results for input(s): LIPASE, AMYLASE in the last 168 hours. No results for input(s): AMMONIA in the last 168 hours. Coagulation Profile: No results for input(s): INR, PROTIME in the last 168 hours. Cardiac Enzymes: No results for input(s): CKTOTAL, CKMB, CKMBINDEX, TROPONINI in the last 168 hours. BNP (last 3 results) No results for input(s): PROBNP in the last 8760 hours. HbA1C: No results for input(s): HGBA1C in the last 72 hours. CBG: No results for input(s): GLUCAP in the last 168 hours. Lipid Profile: No  results for input(s): CHOL, HDL, LDLCALC, TRIG, CHOLHDL, LDLDIRECT in the last 72 hours. Thyroid Function Tests: No results for input(s): TSH, T4TOTAL, FREET4, T3FREE, THYROIDAB in the last 72 hours. Anemia Panel: No results for input(s): VITAMINB12, FOLATE, FERRITIN, TIBC, IRON, RETICCTPCT in the last 72 hours. Urine analysis:    Component Value Date/Time   COLORURINE YELLOW 09/10/2017 1657   APPEARANCEUR CLEAR 09/10/2017 1657   LABSPEC 1.019 09/10/2017 1657   PHURINE 6.0 09/10/2017 1657   GLUCOSEU NEGATIVE 09/10/2017 1657   HGBUR NEGATIVE 09/10/2017 1657   BILIRUBINUR NEGATIVE 09/10/2017 1657   KETONESUR NEGATIVE 09/10/2017 1657   PROTEINUR NEGATIVE 09/10/2017 1657   UROBILINOGEN 0.2 05/16/2012 0847   NITRITE NEGATIVE 09/10/2017 1657   LEUKOCYTESUR NEGATIVE 09/10/2017 1657   Sepsis Labs: @LABRCNTIP (procalcitonin:4,lacticidven:4)  ) Recent Results (from the past 240 hour(s))  Surgical pcr screen     Status: Abnormal   Collection Time: 09/23/17 12:03 AM  Result Value  Ref Range Status   MRSA, PCR NEGATIVE NEGATIVE Final   Staphylococcus aureus POSITIVE (A) NEGATIVE Final    Comment: (NOTE) The Xpert SA Assay (FDA approved for NASAL specimens in patients 56 years of age and older), is one component of a comprehensive surveillance program. It is not intended to diagnose infection nor to guide or monitor treatment. Performed at [facility], [place] [address], [place], [place] [address]   Aerobic/Anaerobic Culture (surgical/deep wound)     Status: None   Collection Time: 09/23/17  2:55 PM  Result Value Ref Range Status   Specimen Description ABSCESS  Final   Special Requests PREPATELLA BURSA  Final   Gram Stain   Final    FEW WBC PRESENT, PREDOMINANTLY PMN NO ORGANISMS SEEN    Culture   Final    No growth aerobically or anaerobically. Performed at [facility], [place] [address], [place], [place] [address]    Report Status 09/28/2017 FINAL  Final       Studies: No results found.  Scheduled Meds: . buprenorphine-naloxone  1 tablet Sublingual BID  . enoxaparin (LOVENOX) injection  70 mg Subcutaneous BID  . famotidine  20 mg Oral Daily  . feeding supplement (ENSURE ENLIVE)  237 mL Oral TID BM  . ferrous sulfate  325 mg Oral BID WC  . nicotine  14 mg Transdermal Daily  . sertraline  50 mg Oral Daily    Continuous Infusions: .  ceFAZolin (ANCEF) IV Stopped (09/28/17 1356)  . lactated ringers 10 mL/hr at 09/23/17 1329     LOS: 17 days     Elmarie Shiley, MD [phone] [facility]   If 7PM-7AM, please contact night-coverage [email or web address] Password [email or web address] 09/28/2017, 2:22 PM

## 2017-09-28 NOTE — Progress Notes (Signed)
Physical Therapy Treatment Patient Details Name: Gary Hicks MRN: [id number] DOB: 06-24-1986 Today's Date: 09/28/2017    History of Present Illness Pt is a 32 y/o male with active IV drug use presenting with generalized pain. Family is concerned about seizures due to finding him in fetal position with abnormal eye movements. He reports global polyarthralgia, use of heroin and methamphetamines in the past 24 hours at time of admission. Diagnosed with staph bacteremia, severe sepsis. PMH bacterial endocarditis, heroin abuse, possible Hepatitis C, opiate abuse, polysubstance abuse, seizures.  Pt also with positive right LE DVT on 2/18.  On Lovenox.      PT Comments    Pt required max encouragement to participate with therapy. Finally agreeable to sit EOB and perform ther ex. Pt requesting assist to sink to shave. He required min A to rise into standing and min guard for gait. VC for increased WB on LLE. Min guard for safety with ADLs at sink, after which pt reported feeling dizzy and fatigued and requested return to bed. Pt would benefit from continued skilled PT to increase activity tolerance and functional independence. Will continue to follow acutely.     Follow Up Recommendations  SNF(pending further mobility progress)     Equipment Recommendations  Rolling walker with 5" wheels    Recommendations for Other Services       Precautions / Restrictions Precautions Precautions: Fall Restrictions Weight Bearing Restrictions: No    Mobility  Bed Mobility Overal bed mobility: Needs Assistance Bed Mobility: Supine to Sit;Sit to Supine     Supine to sit: Min assist;HOB elevated Sit to supine: Min assist;HOB elevated   General bed mobility comments: Min A to progress LLE off and on bed. Pt able to manage torso w/o assist  Transfers Overall transfer level: Needs assistance Equipment used: Rolling walker (2 wheeled) Transfers: Sit to/from Stand Sit to Stand: Min assist          General transfer comment: Min A to power up into standing. VC for hand placement  Ambulation/Gait Ambulation/Gait assistance: Min guard Ambulation Distance (Feet): 16 Feet Assistive device: Rolling walker (2 wheeled) Gait Pattern/deviations: Antalgic;Decreased stance time - left Gait velocity: decreased Gait velocity interpretation: Below normal speed for age/gender General Gait Details: pt agreeable to ambulate to sink for ADLs. Cues for increased WB on LLE. Pt continues to avoid weight shift to the L and heavily reliant on BIL UEs on RW.   Stairs            Wheelchair Mobility    Modified Rankin (Stroke Patients Only)       Balance Overall balance assessment: Needs assistance Sitting-balance support: Feet supported;Bilateral upper extremity supported Sitting balance-Leahy Scale: Fair     Standing balance support: Bilateral upper extremity supported;No upper extremity supported Standing balance-Leahy Scale: Fair Standing balance comment: Pt stood at sink for ~15 with intermittant UE support and moments of no UE support during static standing                            Cognition Arousal/Alertness: Awake/alert Behavior During Therapy: WFL for tasks assessed/performed Overall Cognitive Status: Within Functional Limits for tasks assessed                                        Exercises Total Joint Exercises Ankle Circles/Pumps: AROM;Both;10 reps;Seated Heel Slides: AROM;Both;10 reps;Seated  Long Arc Quad: AROM;Both;10 reps;Seated    General Comments General comments (skin integrity, edema, etc.): Pt fatigues quickly stating he felt overexerted after shaving at sink.      Pertinent Vitals/Pain Pain Assessment: Faces Faces Pain Scale: Hurts even more Pain Location: (L knee) Pain Descriptors / Indicators: Grimacing;Guarding;Aching;Constant Pain Intervention(s): Monitored during session;Limited activity within patient's tolerance     Home Living                      Prior Function            PT Goals (current goals can now be found in the care plan section) Acute Rehab PT Goals Patient Stated Goal: to decrease his pain PT Goal Formulation: With patient/family Time For Goal Achievement: 10/01/17 Potential to Achieve Goals: Good Progress towards PT goals: Progressing toward goals    Frequency    Min 3X/week      PT Plan Current plan remains appropriate    Co-evaluation              AM-PAC PT "6 Clicks" Daily Activity  Outcome Measure  Difficulty turning over in bed (including adjusting bedclothes, sheets and blankets)?: Unable Difficulty moving from lying on back to sitting on the side of the bed? : Unable Difficulty sitting down on and standing up from a chair with arms (e.g., wheelchair, bedside commode, etc,.)?: Unable Help needed moving to and from a bed to chair (including a wheelchair)?: A Little Help needed walking in hospital room?: A Little Help needed climbing 3-5 steps with a railing? : Total 6 Click Score: 10    End of Session Equipment Utilized During Treatment: Gait belt Activity Tolerance: Patient limited by pain Patient left: with call bell/phone within reach;in bed Nurse Communication: Mobility status PT Visit Diagnosis: Muscle weakness (generalized) (M62.81);Difficulty in walking, not elsewhere classified (R26.2);Pain Pain - Right/Left: Left Pain - part of body: Knee;Leg     Time: 6789-3810 PT Time Calculation (min) (ACUTE ONLY): 45 min  Charges:  $Gait Training: 8-22 mins $Therapeutic Exercise: 8-22 mins $Therapeutic Activity: 8-22 mins                    G Codes:      Gary Hicks, [place] Pager [phone] Acute Rehab  Allena Katz 09/28/2017, 3:25 PM

## 2017-09-28 NOTE — Progress Notes (Signed)
Patient ID: Gary Hicks, male   DOB: Feb 25, 1986, 32 y.o.   MRN: [id number]   LOS: 17 days   Subjective: Still c/o hip and knee pain, occ severe. Doesn't feel like it's much better.   Objective: Vital signs in last 24 hours: Temp:  [98 F (36.7 C)-98.3 F (36.8 C)] 98.3 F (36.8 C) (02/27 0813) Pulse Rate:  [101-114] 112 (02/27 0813) Resp:  [16-18] 16 (02/27 0813) BP: (101-114)/(65-72) 112/68 (02/27 0813) SpO2:  [96 %-100 %] 98 % (02/27 0813) Last BM Date: 09/23/17   Laboratory  CBC Recent Labs    09/26/17 0349 09/28/17 0402  WBC 7.2 6.4  HGB 10.0* 9.5*  HCT 30.3* 29.1*  PLT 314 286   BMET Recent Labs    09/26/17 0349 09/28/17 0402  NA 135 133*  K 3.9 3.9  CL 96* 96*  CO2 29 29  GLUCOSE 111* 113*  BUN 9 7  CREATININE 0.63 0.54*  CALCIUM 8.8* 8.7*     Physical Exam General appearance: alert and no distress  Left knee -- Dressing removed, incision C/D/I, some superficial erythema superior portion, no discharge, dressing replaced.   Assessment/Plan: Left knee septic bursitis s/p I&D, bursectomy -- Continues to heal as expected. Should f/u with Dr. Percell Miller as OP in ~10d to 2 weeks.    Lisette Abu, PA-C Orthopedic Surgery [phone] 09/28/2017

## 2017-09-29 LAB — CBC
HCT: 30.5 % — ABNORMAL LOW (ref 39.0–52.0)
Hemoglobin: 10 g/dL — ABNORMAL LOW (ref 13.0–17.0)
MCH: 31.3 pg (ref 26.0–34.0)
MCHC: 32.8 g/dL (ref 30.0–36.0)
MCV: 95.6 fL (ref 78.0–100.0)
PLATELETS: 301 10*3/uL (ref 150–400)
RBC: 3.19 MIL/uL — AB (ref 4.22–5.81)
RDW: 13.3 % (ref 11.5–15.5)
WBC: 8.1 10*3/uL (ref 4.0–10.5)

## 2017-09-29 LAB — BASIC METABOLIC PANEL
Anion gap: 10 (ref 5–15)
BUN: 7 mg/dL (ref 6–20)
CO2: 28 mmol/L (ref 22–32)
Calcium: 8.7 mg/dL — ABNORMAL LOW (ref 8.9–10.3)
Chloride: 95 mmol/L — ABNORMAL LOW (ref 101–111)
Creatinine, Ser: 0.63 mg/dL (ref 0.61–1.24)
GFR calc Af Amer: >60 mL/min (ref >60)
GLUCOSE: 117 mg/dL — AB (ref 65–99)
POTASSIUM: 3.4 mmol/L — AB (ref 3.5–5.1)
Sodium: 133 mmol/L — ABNORMAL LOW (ref 135–145)

## 2017-09-29 MED ORDER — POTASSIUM CHLORIDE CRYS ER 20 MEQ PO TBCR
40.0000 meq | EXTENDED_RELEASE_TABLET | Freq: Two times a day (BID) | ORAL | Status: DC
  Administered 2017-09-30 – 2017-10-06 (×7): 40 meq via ORAL
  Filled 2017-09-29 (×16): qty 2

## 2017-09-29 NOTE — Progress Notes (Signed)
PROGRESS NOTE  Gary Hicks HAL:[id number] DOB: 11-29-1985 DOA: 09/11/2017 PCP: Patient, [name] Per  HPI/Recap of past 28 hours: 32 year old male, active IVDA, who presents with generalized pain. Patient was incarcerated and released 3-4 weeks ago. Patient admits using IV methamphetamine as well as heroin in the past 24 hours prior to admission. Reports global polyarthralgias including his bilateral feet bilateral knees bilateral hips and shoulders. Also reports fevers and chills generalized myalgias. Pt admitted for further management. 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 Pt was also diagnosed with DVT Right gastrocnemius vein . He was started on Lovenox 2/18.     Assessment/Plan: Principal Problem:   Bacteremia due to Staphylococcus aureus Active Problems:   Heroin abuse (HCC)   Polysubstance abuse (HCC)   Severe sepsis (HCC)   Cigarette smoker   Hypokalemia   DIC (disseminated intravascular coagulation) (HCC)   Staphylococcal arthritis of right shoulder (HCC)   Septic infrapatellar bursitis of left knee   Staphylococcal arthritis of left hip (HCC)   Staphylococcal arthritis of right hip (HCC)   Endocarditis of tricuspid valve   Calf abscess   Pain   Pyomyositis   Saphenous vein thrombophlebitis, right   Anxiety  Tricuspid valve endocarditis with MSSA bacteremia Blood culture 2/11 grew MSSA, repeat on 2/15 NGTD ECHO showed: Tricuspid valve: There was a large, 1.1 cm (W) x 1.8 cm (L), multilobulated, highly mobile vegetation on the right ventricular aspect of the anterior leaflet. There was moderate-severe regurgitation Continue IV Ancef for 6 weeks from 2/23-->11/05/16 as per ID ID following Pt will most likely remain in the hospital the entire time as pt in on suboxone and cant go to SNF  Afebrile.   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 MRI Right shoulder: Mild supraspinatus and infraspinatus muscle edema suggestive of Myositis MRI brain negative for abscess MRI C spine: Motion degraded exam. C2-3 through C7-T1 no bulge, stenosis or foraminal narrowing. Mild C3-4 through C5-6 facet arthropathy  MRI R femur: currently pending as pt refused Doppler left arm: Negative for DVT underwent bursectomy and I and D by Dr Percell Miller 2-25.  Ortho saw patient post op. Patient will need to follow up with ortho in 10 days.   IVDA  Dr Damita Dunnings managing Suboxone BID  No high dose IV benzo unless required for seizure  [facility] Psych consulted: start zoloft 50 mg daily May continue Klonopin prn for 2 weeks and wean off, not advised for long term.  Please refer to note dated 2/22 by psych attending for more info  Seizures Likely related to drug use Seizure precautions PRN Ativan MRI negative  Iron def anemia Continue PO iron Hb stable.   DVT right Gastrocnemius vein Also superficial saphenous thrombophlebitis.  Continue Lovenox per pharmacy Hb stable. Platelet norma;   Thrombocytopenia Resolved  Hyponatremia Fluctuates.  Daily BMP   Hepatitis c antibody positive ID on board  Hypokalemia; replete orally.   Code Status: Full  Family Communication: None at bedside  Disposition Plan: TBD   Consultants:  ID  Orthopedics  Procedures:  I&D of L knee 09/23/17  Antimicrobials:  IV Ancef  DVT prophylaxis:  Therapeutic lovenox  Objective: Vitals:   09/28/17 1708 09/28/17 2148 09/29/17 0514 09/29/17 0958  BP: 106/70 94/71 97/62  99/68  Pulse: (!) 116 (!) 115 (!) 105 (!) 111  Resp: 16 16 17 17   Temp: 98.3 F (36.8 C) 98.6 F (37 C) 98 F (36.7 C) (!) 97.5 F (36.4 C)  TempSrc: Oral  Oral Oral Oral  SpO2: 98% 96% 96% 95%  Weight:      Height:        Intake/Output Summary (Last 24 hours) at 09/29/2017 1410 Last data filed at 09/29/2017 0959 Gross per 24 hour  Intake 1750 ml  Output 1300 ml  Net 450 ml   Filed Weights   09/21/17 2056 09/22/17 2110 09/23/17 1328  Weight: 73.1 kg (161 lb 1.1 oz) 74 kg (163 lb 2.3 oz) 74 kg (163 lb 2.3 oz)    Exam:   General: NAD, thin appearing.   Cardiovascular: S 1, S 2 RRR  Respiratory:  Normal respiratory effort, CTA  Abdomen: BS present, soft, nt  Musculoskeletal: Left knee with dressing.   Skin: No rash   Psychiatry: flat affect.    Data Reviewed: CBC: Recent Labs  Lab 09/23/17 0500 09/25/17 0719 09/26/17 0349 09/28/17 0402 09/29/17 0413  WBC 7.9 7.0 7.2 6.4 8.1  NEUTROABS 5.1 4.0 4.9 3.7  --   HGB 11.8* 9.7* 10.0* 9.5* 10.0*  HCT 35.0* 29.3* 30.3* 29.1* 30.5*  MCV 94.6 95.8 95.9 95.7 95.6  PLT 392 306 314 286 119   Basic Metabolic Panel: Recent Labs  Lab 09/23/17 0500 09/25/17 0719 09/26/17 0349 09/28/17 0402 09/29/17 0413  NA 134* 131* 135 133* 133*  K 4.6 4.0 3.9 3.9 3.4*  CL 96* 95* 96* 96* 95*  CO2 26 26 29 29 28   GLUCOSE 98 104* 111* 113* 117*  BUN 10 9 9 7 7   CREATININE 0.66 0.62 0.63 0.54* 0.63  CALCIUM 8.9 8.4* 8.8* 8.7* 8.7*   GFR: Estimated Creatinine Clearance: 133.8 mL/min (by C-G formula based on SCr of 0.63 mg/dL). Liver Function Tests: No results for input(s): AST, ALT, ALKPHOS, BILITOT, PROT, ALBUMIN in the last 168 hours. No results for input(s): LIPASE, AMYLASE in the last 168 hours. No results for input(s): AMMONIA in the last 168 hours. Coagulation Profile: No results for input(s): INR, PROTIME in the last 168 hours. Cardiac Enzymes: No results for input(s): CKTOTAL, CKMB, CKMBINDEX, TROPONINI in the last 168 hours. BNP (last 3 results) No results for input(s): PROBNP in the last 8760 hours. HbA1C: No results for input(s): HGBA1C in the last 72 hours. CBG: No  results for input(s): GLUCAP in the last 168 hours. Lipid Profile: No results for input(s): CHOL, HDL, LDLCALC, TRIG, CHOLHDL, LDLDIRECT in the last 72 hours. Thyroid Function Tests: No results for input(s): TSH, T4TOTAL, FREET4, T3FREE, THYROIDAB in the last 72 hours. Anemia Panel: No results for input(s): VITAMINB12, FOLATE, FERRITIN, TIBC, IRON, RETICCTPCT in the last 72 hours. Urine analysis:    Component Value Date/Time   COLORURINE YELLOW 09/10/2017 1657   APPEARANCEUR CLEAR 09/10/2017 1657   LABSPEC 1.019 09/10/2017 1657   PHURINE 6.0 09/10/2017 1657   GLUCOSEU NEGATIVE 09/10/2017 1657   HGBUR NEGATIVE 09/10/2017 1657   BILIRUBINUR NEGATIVE 09/10/2017 1657   KETONESUR NEGATIVE 09/10/2017 1657   PROTEINUR NEGATIVE 09/10/2017 1657   UROBILINOGEN 0.2 05/16/2012 0847   NITRITE NEGATIVE 09/10/2017 1657   LEUKOCYTESUR NEGATIVE 09/10/2017 1657   Sepsis Labs: @LABRCNTIP (procalcitonin:4,lacticidven:4)  ) Recent Results (from the past 240 hour(s))  Surgical pcr screen  Status: Abnormal   Collection Time: 09/23/17 12:03 AM  Result Value Ref Range Status   MRSA, PCR NEGATIVE NEGATIVE Final   Staphylococcus aureus POSITIVE (A) NEGATIVE Final    Comment: (NOTE) The Xpert SA Assay (FDA approved for NASAL specimens in patients 42 years of age and older), is one component of a comprehensive surveillance program. It is not intended to diagnose infection nor to guide or monitor treatment. Performed at [facility], [place] [address] [place], [place] [address]   Aerobic/Anaerobic Culture (surgical/deep wound)     Status: None   Collection Time: 09/23/17  2:55 PM  Result Value Ref Range Status   Specimen Description ABSCESS  Final   Special Requests PREPATELLA BURSA  Final   Gram Stain   Final    FEW WBC PRESENT, PREDOMINANTLY PMN NO ORGANISMS SEEN    Culture   Final    No growth aerobically or anaerobically. Performed at [facility], [place] [address]  [place], [place] [address]    Report Status 09/28/2017 FINAL  Final      Studies: No results found.  Scheduled Meds: . buprenorphine-naloxone  1 tablet Sublingual BID  . enoxaparin (LOVENOX) injection  70 mg Subcutaneous BID  . famotidine  20 mg Oral Daily  . feeding supplement (ENSURE ENLIVE)  237 mL Oral TID BM  . ferrous sulfate  325 mg Oral BID WC  . nicotine  14 mg Transdermal Daily  . potassium chloride  40 mEq Oral BID  . sertraline  50 mg Oral Daily    Continuous Infusions: .  ceFAZolin (ANCEF) IV Stopped (09/29/17 0650)  . lactated ringers 10 mL/hr at 09/23/17 1329     LOS: 18 days     Elmarie Shiley, MD [phone] [facility]   If 7PM-7AM, please contact night-coverage [email or web address] Password [email or web address] 09/29/2017, 2:10 PM

## 2017-09-29 NOTE — Progress Notes (Signed)
[facility] for Lovenox Indication: DVT  Allergies  Allergen Reactions  . Prednisone Hives  . Amoxicillin Hives    Patient Measurements: Height: 5\' 9"  (175.3 cm) Weight: 163 lb 2.3 oz (74 kg) IBW/kg (Calculated) : 70.7 Lovenox Dosing Weight: 70 kg  Vital Signs: Temp: 98 F (36.7 C) (02/28 0514) Temp Source: Oral (02/28 0514) BP: 97/62 (02/28 0514) Pulse Rate: 105 (02/28 0514)  Labs: Recent Labs    09/28/17 0402 09/29/17 0413  HGB 9.5* 10.0*  HCT 29.1* 30.5*  PLT 286 301  CREATININE 0.54* 0.63    Estimated Creatinine Clearance: 133.8 mL/min (by C-G formula based on SCr of 0.63 mg/dL).  Assessment: 5 yoM with history of recurrent IVDU admitted with MSSA bacteremia and metastatic infection including tricuspid valve endocarditis, septic knee bursitis, septic thrombophlebitis of the saphenous vein. Bilateral lower extremity venous duplex shows evidence of acute DVT of the right lower extremity.   H/H: low but stable, PLT wnl  Goal of Therapy:  Monitor platelets by anticoagulation protocol: Yes   Plan:  Lovenox 70 mg SQ q12h Monitor CBC, renal function Monitor for signs/symptoms of bleeding F/U change to an oral agent - ortho to evaluate knee  Thank you Anette Guarneri, PharmD [phone] 09/29/2017 8:45 AM

## 2017-09-30 LAB — CBC
HCT: 29.2 % — ABNORMAL LOW (ref 39.0–52.0)
Hemoglobin: 9.5 g/dL — ABNORMAL LOW (ref 13.0–17.0)
MCH: 31 pg (ref 26.0–34.0)
MCHC: 32.5 g/dL (ref 30.0–36.0)
MCV: 95.4 fL (ref 78.0–100.0)
PLATELETS: 297 10*3/uL (ref 150–400)
RBC: 3.06 MIL/uL — AB (ref 4.22–5.81)
RDW: 13.2 % (ref 11.5–15.5)
WBC: 9.5 10*3/uL (ref 4.0–10.5)

## 2017-09-30 LAB — BASIC METABOLIC PANEL
Anion gap: 10 (ref 5–15)
BUN: 12 mg/dL (ref 6–20)
CALCIUM: 8.6 mg/dL — AB (ref 8.9–10.3)
CO2: 27 mmol/L (ref 22–32)
CREATININE: 0.62 mg/dL (ref 0.61–1.24)
Chloride: 95 mmol/L — ABNORMAL LOW (ref 101–111)
GFR calc Af Amer: >60 mL/min (ref >60)
GFR calc non Af Amer: >60 mL/min (ref >60)
Glucose, Bld: 110 mg/dL — ABNORMAL HIGH (ref 65–99)
Potassium: 4.1 mmol/L (ref 3.5–5.1)
Sodium: 132 mmol/L — ABNORMAL LOW (ref 135–145)

## 2017-09-30 NOTE — Progress Notes (Signed)
PROGRESS NOTE  Gary Hicks UVO:[id number] DOB: October 01, 1985 DOA: 09/11/2017 PCP: Patient, [name] Per  HPI/Recap of past 34 hours: 32 year old male, active IVDA, who presents with generalized pain. Patient was incarcerated and released 3-4 weeks ago. Patient admits using IV methamphetamine as well as heroin in the past 24 hours prior to admission. Reports global polyarthralgias including his bilateral feet bilateral knees bilateral hips and shoulders. Also reports fevers and chills generalized myalgias. Pt admitted for further management. 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 Pt was also diagnosed with DVT Right gastrocnemius vein . He was started on Lovenox 2/18.   Subjective;  He is complaining of shooting pain in his left leg.  Denies chest pain or dyspnea.     Assessment/Plan: Principal Problem:   Bacteremia due to Staphylococcus aureus Active Problems:   Heroin abuse (HCC)   Polysubstance abuse (HCC)   Severe sepsis (HCC)   Cigarette smoker   Hypokalemia   DIC (disseminated intravascular coagulation) (HCC)   Staphylococcal arthritis of right shoulder (HCC)   Septic infrapatellar bursitis of left knee   Staphylococcal arthritis of left hip (HCC)   Staphylococcal arthritis of right hip (HCC)   Endocarditis of tricuspid valve   Calf abscess   Pain   Pyomyositis   Saphenous vein thrombophlebitis, right   Anxiety  Tricuspid valve endocarditis with MSSA bacteremia Blood culture 2/11 grew MSSA, repeat on 2/15 NGTD ECHO showed: Tricuspid valve: There was a large, 1.1 cm (W) x 1.8 cm (L), multilobulated, highly mobile vegetation on the right ventricular aspect of the anterior leaflet. There was moderate-severe regurgitation Continue IV Ancef for 6 weeks from 2/23-->11/05/16 as per ID ID following Pt will most likely remain in the hospital the entire time as pt in on suboxone and cant go to SNF  Afebrile.   Metastatic infection  including septic L knee joint S/P I&D of L Knee prepatellar bursitis on 09/23/17 Cultures from I&D, NGTD MRI left knee: 4.8 x 0.8 x 4 cm fluid collection in subcutaneous fat anterior to patellar tendon with severe surrounding soft tissue edema concerning for septic bursitis MRI hip: No drainable fluid collection or abscess. MRI Right shoulder: Mild supraspinatus and infraspinatus muscle edema suggestive of Myositis MRI brain negative for abscess MRI C spine: Motion degraded exam. C2-3 through C7-T1 no bulge, stenosis or foraminal narrowing. Mild C3-4 through C5-6 facet arthropathy  MRI R femur: currently pending as pt refused Doppler left arm: Negative for DVT underwent bursectomy and I and D by Dr Percell Miller 2-25.  Ortho saw patient post op. Patient will need to follow up with ortho in 10 days.   IVDA  Dr Damita Dunnings managing Suboxone BID  No high dose IV benzo unless required for seizure  GAD Psych consulted: start zoloft 50 mg daily May continue Klonopin prn for 2 weeks and wean off, not advised for long term.  Please refer to note dated 2/22 by psych attending for more info  Seizures Likely related to drug use Seizure precautions PRN Ativan MRI negative  Iron def anemia Continue PO iron Hb stable.   DVT right Gastrocnemius vein Also superficial saphenous thrombophlebitis.  Continue Lovenox per pharmacy Hb stable. Platelet norma;   Thrombocytopenia Resolved  Hyponatremia Fluctuates.  Daily BMP   Hepatitis c antibody positive ID on board  Hypokalemia; replete orally.   Code Status: Full  Family Communication: None at bedside  Disposition Plan: TBD   Consultants:  ID  Orthopedics  Procedures:  I&D of L knee 09/23/17  Antimicrobials:  IV Ancef  DVT prophylaxis:  Therapeutic lovenox   Objective: Vitals:   09/29/17 1653 09/29/17 2041 09/30/17 0421 09/30/17 0953  BP: 91/65 (!) 111/58 97/72 98/68   Pulse: (!) 109 (!) 113 (!) 104 (!) 101  Resp: 18  16 17 18   Temp: 98.4 F (36.9 C) 98.5 F (36.9 C) 98.8 F (37.1 C) 98.4 F (36.9 C)  TempSrc: Oral Oral Oral Oral  SpO2: 97% 96% 99% 98%  Weight:      Height:        Intake/Output Summary (Last 24 hours) at 09/30/2017 1433 Last data filed at 09/30/2017 0900 Gross per 24 hour  Intake 880 ml  Output 450 ml  Net 430 ml   Filed Weights   09/21/17 2056 09/22/17 2110 09/23/17 1328  Weight: 73.1 kg (161 lb 1.1 oz) 74 kg (163 lb 2.3 oz) 74 kg (163 lb 2.3 oz)    Exam:   General: NAD  Cardiovascular: S 1, S 2 RrR  Respiratory:  CTA  Abdomen; BS present, soft, nt  Musculoskeletal: left knee with dressing.   Skin: No rash   Psychiatry: flat affect    Data Reviewed: CBC: Recent Labs  Lab 09/25/17 0719 09/26/17 0349 09/28/17 0402 09/29/17 0413 09/30/17 0406  WBC 7.0 7.2 6.4 8.1 9.5  NEUTROABS 4.0 4.9 3.7  --   --   HGB 9.7* 10.0* 9.5* 10.0* 9.5*  HCT 29.3* 30.3* 29.1* 30.5* 29.2*  MCV 95.8 95.9 95.7 95.6 95.4  PLT 306 314 286 301 409   Basic Metabolic Panel: Recent Labs  Lab 09/25/17 0719 09/26/17 0349 09/28/17 0402 09/29/17 0413 09/30/17 0406  NA 131* 135 133* 133* 132*  K 4.0 3.9 3.9 3.4* 4.1  CL 95* 96* 96* 95* 95*  CO2 26 29 29 28 27   GLUCOSE 104* 111* 113* 117* 110*  BUN 9 9 7 7 12   CREATININE 0.62 0.63 0.54* 0.63 0.62  CALCIUM 8.4* 8.8* 8.7* 8.7* 8.6*   GFR: Estimated Creatinine Clearance: 133.8 mL/min (by C-G formula based on SCr of 0.62 mg/dL). Liver Function Tests: No results for input(s): AST, ALT, ALKPHOS, BILITOT, PROT, ALBUMIN in the last 168 hours. No results for input(s): LIPASE, AMYLASE in the last 168 hours. No results for input(s): AMMONIA in the last 168 hours. Coagulation Profile: No results for input(s): INR, PROTIME in the last 168 hours. Cardiac Enzymes: No results for input(s): CKTOTAL, CKMB, CKMBINDEX, TROPONINI in the last 168 hours. BNP (last 3 results) No results for input(s): PROBNP in the last 8760 hours. HbA1C: No  results for input(s): HGBA1C in the last 72 hours. CBG: No results for input(s): GLUCAP in the last 168 hours. Lipid Profile: No results for input(s): CHOL, HDL, LDLCALC, TRIG, CHOLHDL, LDLDIRECT in the last 72 hours. Thyroid Function Tests: No results for input(s): TSH, T4TOTAL, FREET4, T3FREE, THYROIDAB in the last 72 hours. Anemia Panel: No results for input(s): VITAMINB12, FOLATE, FERRITIN, TIBC, IRON, RETICCTPCT in the last 72 hours. Urine analysis:    Component Value Date/Time   COLORURINE YELLOW 09/10/2017 1657   APPEARANCEUR CLEAR 09/10/2017 1657   LABSPEC 1.019 09/10/2017 1657   PHURINE 6.0 09/10/2017 1657   GLUCOSEU NEGATIVE 09/10/2017 1657   HGBUR NEGATIVE 09/10/2017 1657   BILIRUBINUR NEGATIVE 09/10/2017 1657   KETONESUR NEGATIVE 09/10/2017 1657   PROTEINUR NEGATIVE 09/10/2017 1657   UROBILINOGEN 0.2 05/16/2012 0847   NITRITE NEGATIVE 09/10/2017 1657   LEUKOCYTESUR NEGATIVE 09/10/2017 1657   Sepsis  Labs: @LABRCNTIP (procalcitonin:4,lacticidven:4)  ) Recent Results (from the past 240 hour(s))  Surgical pcr screen     Status: Abnormal   Collection Time: 09/23/17 12:03 AM  Result Value Ref Range Status   MRSA, PCR NEGATIVE NEGATIVE Final   Staphylococcus aureus POSITIVE (A) NEGATIVE Final    Comment: (NOTE) The Xpert SA Assay (FDA approved for NASAL specimens in patients 72 years of age and older), is one component of a comprehensive surveillance program. It is not intended to diagnose infection nor to guide or monitor treatment. Performed at [facility], [place] [address] [place], [place] [address]   Aerobic/Anaerobic Culture (surgical/deep wound)     Status: None   Collection Time: 09/23/17  2:55 PM  Result Value Ref Range Status   Specimen Description ABSCESS  Final   Special Requests PREPATELLA BURSA  Final   Gram Stain   Final    FEW WBC PRESENT, PREDOMINANTLY PMN NO ORGANISMS SEEN    Culture   Final    No growth aerobically or  anaerobically. Performed at [facility], [place] [address], [place],  [address]    Report Status 09/28/2017 FINAL  Final      Studies: No results found.  Scheduled Meds: . buprenorphine-naloxone  1 tablet Sublingual BID  . enoxaparin (LOVENOX) injection  70 mg Subcutaneous BID  . famotidine  20 mg Oral Daily  . feeding supplement (ENSURE ENLIVE)  237 mL Oral TID BM  . ferrous sulfate  325 mg Oral BID WC  . nicotine  14 mg Transdermal Daily  . potassium chloride  40 mEq Oral BID  . sertraline  50 mg Oral Daily    Continuous Infusions: .  ceFAZolin (ANCEF) IV 2 g (09/30/17 1330)  . lactated ringers 10 mL/hr at 09/23/17 1329     LOS: 19 days     Elmarie Shiley, MD [phone] [facility]   If 7PM-7AM, please contact night-coverage [email or web address] Password [facility] 09/30/2017, 2:33 PM

## 2017-09-30 NOTE — Progress Notes (Addendum)
Patient refuses to brush his teeth.  Attempted skin assessment, patient refused to turn, and became verbally abusive. Patient was educated on pressure injury prevention. Refuses to float heels or reposition.

## 2017-09-30 NOTE — Progress Notes (Signed)
Physical Therapy Treatment Patient Details Name: Gary Hicks MRN: [id number] DOB: 10-17-1985 Today's Date: 09/30/2017    History of Present Illness Pt is a 32 y/o male with active IV drug use presenting with generalized pain. Family is concerned about seizures due to finding him in fetal position with abnormal eye movements. He reports global polyarthralgia, use of heroin and methamphetamines in the past 24 hours at time of admission. Diagnosed with staph bacteremia, severe sepsis. PMH bacterial endocarditis, heroin abuse, possible Hepatitis C, opiate abuse, polysubstance abuse, seizures.  Pt also with positive right LE DVT on 2/18.  On Lovenox.      PT Comments    Pt making fair progress with functional mobility but remains limited secondary to pain and fatigue. Pt only tolerating bed mobility, sit<>stand transfer x1, standing with RW ~1 minute and bilateral LE therex. Pt would continue to benefit from skilled physical therapy services at this time while admitted and after d/c to address the below listed limitations in order to improve overall safety and independence with functional mobility.    Follow Up Recommendations  SNF;Other (comment)(pending further mobility progress)     Equipment Recommendations  Rolling walker with 5" wheels    Recommendations for Other Services       Precautions / Restrictions Precautions Precautions: Fall Restrictions Weight Bearing Restrictions: No    Mobility  Bed Mobility Overal bed mobility: Needs Assistance Bed Mobility: Supine to Sit     Supine to sit: Supervision     General bed mobility comments: increased time and effort, pt using R LE to assist L LE with movement; pt grimacing with movement and reporting bilateral hip pain  Transfers Overall transfer level: Needs assistance Equipment used: Rolling walker (2 wheeled) Transfers: Sit to/from Stand Sit to Stand: Min guard         General transfer comment: min guard for safety; bed  in elevated position  Ambulation/Gait             General Gait Details: unable at this time   Stairs            Wheelchair Mobility    Modified Rankin (Stroke Patients Only)       Balance Overall balance assessment: Needs assistance Sitting-balance support: Feet supported;Bilateral upper extremity supported Sitting balance-Leahy Scale: Fair     Standing balance support: No upper extremity supported Standing balance-Leahy Scale: Fair                              Cognition Arousal/Alertness: Awake/alert Behavior During Therapy: WFL for tasks assessed/performed Overall Cognitive Status: Within Functional Limits for tasks assessed                                        Exercises General Exercises - Lower Extremity Ankle Circles/Pumps: AROM;Both;10 reps;Seated Long Arc Quad: AROM;Both;10 reps;Seated Hip Flexion/Marching: AAROM;Both;5 reps;Seated Toe Raises: AROM;Both;10 reps;Seated Heel Raises: AROM;Both;10 reps;Seated    General Comments        Pertinent Vitals/Pain Pain Assessment: Faces Faces Pain Scale: Hurts little more Pain Location: bilateral hips Pain Descriptors / Indicators: Grimacing;Guarding;Aching;Constant Pain Intervention(s): Monitored during session;Repositioned    Home Living                      Prior Function  PT Goals (current goals can now be found in the care plan section) Acute Rehab PT Goals PT Goal Formulation: With patient/family Time For Goal Achievement: 10/01/17 Potential to Achieve Goals: Good Progress towards PT goals: Progressing toward goals    Frequency    Min 3X/week      PT Plan Current plan remains appropriate    Co-evaluation              AM-PAC PT "6 Clicks" Daily Activity  Outcome Measure  Difficulty turning over in bed (including adjusting bedclothes, sheets and blankets)?: A Little Difficulty moving from lying on back to sitting on the  side of the bed? : A Little Difficulty sitting down on and standing up from a chair with arms (e.g., wheelchair, bedside commode, etc,.)?: Unable Help needed moving to and from a bed to chair (including a wheelchair)?: A Little Help needed walking in hospital room?: A Lot Help needed climbing 3-5 steps with a railing? : Total 6 Click Score: 13    End of Session   Activity Tolerance: Patient limited by pain Patient left: with call bell/phone within reach;in bed Nurse Communication: Mobility status PT Visit Diagnosis: Muscle weakness (generalized) (M62.81);Difficulty in walking, not elsewhere classified (R26.2);Pain Pain - Right/Left: (bilateral) Pain - part of body: Hip     Time: 7564-3329 PT Time Calculation (min) (ACUTE ONLY): 30 min  Charges:  $Therapeutic Exercise: 8-22 mins $Therapeutic Activity: 8-22 mins                    G Codes:       [facility], Gary Hicks, Gary Hicks 09/30/2017, 5:01 PM

## 2017-09-30 NOTE — Plan of Care (Signed)
  Progressing Health Behavior/Discharge Planning: Ability to manage health-related needs will improve 09/30/2017 1539 - Progressing by Evalee Jefferson, RN Activity: Risk for activity intolerance will decrease 09/30/2017 1539 - Progressing by Evalee Jefferson, RN Elimination: Will not experience complications related to urinary retention 09/30/2017 1539 - Progressing by Jarryd Gratz, Vertell Limber, RN

## 2017-10-01 ENCOUNTER — Inpatient Hospital Stay (HOSPITAL_COMMUNITY): Payer: Self-pay

## 2017-10-01 NOTE — Progress Notes (Signed)
PROGRESS NOTE  Gary Hicks OZD:[id number] DOB: 14-Dec-1985 DOA: 09/11/2017 PCP: Patient, [name] Per  HPI/Recap of past 78 hours: 32 year old male, active IVDA, who presents with generalized pain. Patient was incarcerated and released 3-4 weeks ago. Patient admits using IV methamphetamine as well as heroin in the past 24 hours prior to admission. Reports global polyarthralgias including his bilateral feet bilateral knees bilateral hips and shoulders. Also reports fevers and chills generalized myalgias. Pt admitted for further management. 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 Pt was also diagnosed with DVT Right gastrocnemius vein . He was started on Lovenox 2/18.   Subjective;  Complaints today of hip pain worse on the left.  He report SOB on exertion and lightheaded.     Assessment/Plan: Principal Problem:   Bacteremia due to Staphylococcus aureus Active Problems:   Heroin abuse (HCC)   Polysubstance abuse (HCC)   Severe sepsis (HCC)   Cigarette smoker   Hypokalemia   DIC (disseminated intravascular coagulation) (HCC)   Staphylococcal arthritis of right shoulder (HCC)   Septic infrapatellar bursitis of left knee   Staphylococcal arthritis of left hip (HCC)   Staphylococcal arthritis of right hip (HCC)   Endocarditis of tricuspid valve   Calf abscess   Pain   Pyomyositis   Saphenous vein thrombophlebitis, right   Anxiety  Tricuspid valve endocarditis with MSSA bacteremia Blood culture 2/11 grew MSSA, repeat on 2/15 NGTD ECHO showed: Tricuspid valve: There was a large, 1.1 cm (W) x 1.8 cm (L), multilobulated, highly mobile vegetation on the right ventricular aspect of the anterior leaflet. There was moderate-severe regurgitation Continue IV Ancef for 6 weeks from 2/23-->11/05/16 as per ID ID following Pt will most likely remain in the hospital the entire time as pt in on suboxone and cant go to SNF  Afebrile.   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 MRI Right shoulder: Mild supraspinatus and infraspinatus muscle edema suggestive of Myositis MRI brain negative for abscess MRI C spine: Motion degraded exam. C2-3 through C7-T1 no bulge, stenosis or foraminal narrowing. Mild C3-4 through C5-6 facet arthropathy  MRI R femur: currently pending as pt refused Doppler left arm: Negative for DVT underwent bursectomy and I and D by Dr Percell Miller 2-25.  Ortho saw patient post op. Patient will need to follow up with ortho in 10 days.  Repeat hip x ray, complaints of worsening pain today.   Lightheaded, SOB;  Check oxygen sat and orthostatic vitals.   IVDA  Dr Damita Dunnings managing Suboxone BID  No high dose IV benzo unless required for seizure  GAD Psych consulted: start zoloft 50 mg daily May continue Klonopin prn for 2 weeks and wean off, not advised for long term.  Please refer to note dated 2/22 by psych attending for more info  Seizures Likely related to drug use Seizure precautions PRN Ativan MRI negative  Iron def anemia Continue PO iron Hb stable.   DVT right Gastrocnemius vein Also superficial saphenous thrombophlebitis.  Continue Lovenox per pharmacy Hb stable. Platelet normal;   Thrombocytopenia Resolved  Hyponatremia Fluctuates.  Daily BMP   Hepatitis c antibody positive ID on board  Hypokalemia; resolved.  Code Status: Full  Family Communication: None at bedside  Disposition Plan: TBD   Consultants:  ID  Orthopedics  Procedures:  I&D of L knee 09/23/17  Antimicrobials:  IV Ancef  DVT prophylaxis:  Therapeutic lovenox   Objective: Vitals:   09/30/17 0953  09/30/17 1744 09/30/17 2218 10/01/17 0540  BP: 98/68 101/71 97/68 104/63  Pulse: (!) 101 98 (!) 111 (!) 104  Resp: 18 18 16 16   Temp: 98.4 F (36.9 C) 98.9 F (37.2 C) 98.1 F (36.7 C) 98.4 F (36.9 C)  TempSrc: Oral Oral Oral Oral  SpO2: 98% 98% 97% 97%  Weight:      Height:        Intake/Output Summary (Last 24 hours) at 10/01/2017 0953 Last data filed at 10/01/2017 7564 Gross per 24 hour  Intake 1020 ml  Output 1320 ml  Net -300 ml   Filed Weights   09/21/17 2056 09/22/17 2110 09/23/17 1328  Weight: 73.1 kg (161 lb 1.1 oz) 74 kg (163 lb 2.3 oz) 74 kg (163 lb 2.3 oz)    Exam:   General: NAD  Cardiovascular: S 1, S 2 RRR  Respiratory:  CTA  Abdomen; BS present, soft, nt  Musculoskeletal: left knee with dressing.   Skin: No rash  Psychiatry: flat affect  Data Reviewed: CBC: Recent Labs  Lab 09/25/17 0719 09/26/17 0349 09/28/17 0402 09/29/17 0413 09/30/17 0406  WBC 7.0 7.2 6.4 8.1 9.5  NEUTROABS 4.0 4.9 3.7  --   --   HGB 9.7* 10.0* 9.5* 10.0* 9.5*  HCT 29.3* 30.3* 29.1* 30.5* 29.2*  MCV 95.8 95.9 95.7 95.6 95.4  PLT 306 314 286 301 332   Basic Metabolic Panel: Recent Labs  Lab 09/25/17 0719 09/26/17 0349 09/28/17 0402 09/29/17 0413 09/30/17 0406  NA 131* 135 133* 133* 132*  K 4.0 3.9 3.9 3.4* 4.1  CL 95* 96* 96* 95* 95*  CO2 26 29 29 28 27   GLUCOSE 104* 111* 113* 117* 110*  BUN 9 9 7 7 12   CREATININE 0.62 0.63 0.54* 0.63 0.62  CALCIUM 8.4* 8.8* 8.7* 8.7* 8.6*   GFR: Estimated Creatinine Clearance: 133.8 mL/min (by C-G formula based on SCr of 0.62 mg/dL). Liver Function Tests: No results for input(s): AST, ALT, ALKPHOS, BILITOT, PROT, ALBUMIN in the last 168 hours. No results for input(s): LIPASE, AMYLASE in the last 168 hours. No results for input(s): AMMONIA in the last 168 hours. Coagulation Profile: No results for input(s): INR, PROTIME in the last 168 hours. Cardiac Enzymes: No results for input(s): CKTOTAL, CKMB, CKMBINDEX,  TROPONINI in the last 168 hours. BNP (last 3 results) No results for input(s): PROBNP in the last 8760 hours. HbA1C: No results for input(s): HGBA1C in the last 72 hours. CBG: No results for input(s): GLUCAP in the last 168 hours. Lipid Profile: No results for input(s): CHOL, HDL, LDLCALC, TRIG, CHOLHDL, LDLDIRECT in the last 72 hours. Thyroid Function Tests: No results for input(s): TSH, T4TOTAL, FREET4, T3FREE, THYROIDAB in the last 72 hours. Anemia Panel: No results for input(s): VITAMINB12, FOLATE, FERRITIN, TIBC, IRON, RETICCTPCT in the last 72 hours. Urine analysis:    Component Value Date/Time   COLORURINE YELLOW 09/10/2017 [place] 09/10/2017 1657   LABSPEC 1.019 09/10/2017 1657   PHURINE 6.0 09/10/2017 1657   GLUCOSEU NEGATIVE 09/10/2017 1657   HGBUR NEGATIVE 09/10/2017 [place] 09/10/2017 [place] 09/10/2017 [place] 09/10/2017 1657   UROBILINOGEN 0.2 05/16/2012  2233   NITRITE NEGATIVE 09/10/2017 [place] 09/10/2017 1657   Sepsis Labs: @LABRCNTIP (procalcitonin:4,lacticidven:4)  ) Recent Results (from the past 240 hour(s))  Surgical pcr screen     Status: Abnormal   Collection Time: 09/23/17 12:03 AM  Result Value Ref Range Status   MRSA, PCR NEGATIVE NEGATIVE Final   Staphylococcus aureus POSITIVE (A) NEGATIVE Final    Comment: (NOTE) The Xpert SA Assay (FDA approved for NASAL specimens in patients 87 years of age and older), is one component of a comprehensive surveillance program. It is not intended to diagnose infection nor to guide or monitor treatment. Performed at [facility], [place] [address] [place], [place] [address]   Aerobic/Anaerobic Culture (surgical/deep wound)     Status: None   Collection Time: 09/23/17  2:55 PM  Result Value Ref Range Status   Specimen Description ABSCESS  Final   Special Requests PREPATELLA BURSA  Final   Gram Stain   Final     FEW WBC PRESENT, PREDOMINANTLY PMN NO ORGANISMS SEEN    Culture   Final    No growth aerobically or anaerobically. Performed at [facility], [place] [address] [place], [place] [address]    Report Status 09/28/2017 FINAL  Final      Studies: No results found.  Scheduled Meds: . buprenorphine-naloxone  1 tablet Sublingual BID  . enoxaparin (LOVENOX) injection  70 mg Subcutaneous BID  . famotidine  20 mg Oral Daily  . feeding supplement (ENSURE ENLIVE)  237 mL Oral TID BM  . ferrous sulfate  325 mg Oral BID WC  . nicotine  14 mg Transdermal Daily  . potassium chloride  40 mEq Oral BID  . sertraline  50 mg Oral Daily    Continuous Infusions: .  ceFAZolin (ANCEF) IV Stopped (10/01/17 0705)  . lactated ringers 10 mL/hr at 09/23/17 1329     LOS: 20 days     Elmarie Shiley, MD [phone] [facility]   If 7PM-7AM, please contact night-coverage [email or web address] Password [facility] 10/01/2017, 9:53 AM

## 2017-10-02 ENCOUNTER — Inpatient Hospital Stay (HOSPITAL_COMMUNITY): Payer: Self-pay

## 2017-10-02 LAB — CBC
HEMATOCRIT: 30.1 % — AB (ref 39.0–52.0)
HEMOGLOBIN: 10 g/dL — AB (ref 13.0–17.0)
MCH: 31.5 pg (ref 26.0–34.0)
MCHC: 33.2 g/dL (ref 30.0–36.0)
MCV: 95 fL (ref 78.0–100.0)
PLATELETS: 327 10*3/uL (ref 150–400)
RBC: 3.17 MIL/uL — AB (ref 4.22–5.81)
RDW: 13.1 % (ref 11.5–15.5)
WBC: 9.1 10*3/uL (ref 4.0–10.5)

## 2017-10-02 MED ORDER — ENOXAPARIN SODIUM 120 MG/0.8ML ~~LOC~~ SOLN
110.0000 mg | SUBCUTANEOUS | Status: DC
  Administered 2017-10-02: 110 mg via SUBCUTANEOUS
  Filled 2017-10-02: qty 0.73

## 2017-10-02 MED ORDER — GADOBENATE DIMEGLUMINE 529 MG/ML IV SOLN
15.0000 mL | Freq: Once | INTRAVENOUS | Status: AC | PRN
  Administered 2017-10-02: 15 mL via INTRAVENOUS

## 2017-10-02 MED ORDER — SODIUM CHLORIDE 0.9 % IV SOLN
INTRAVENOUS | Status: DC
  Administered 2017-10-02 (×2): 100 mL/h via INTRAVENOUS
  Administered 2017-10-03 – 2017-10-08 (×11): via INTRAVENOUS

## 2017-10-02 NOTE — Progress Notes (Signed)
Patient refused lovenox injection.  States he will only take late in the afternoon and in evening.  I discussed with him that having doses this close together is not therapeutic and would even lead to increased potential side effects.  He stated, "I don't care" and stated he would not take any shots in the morning.  He prefers to take in the afternoon.  Consulted with MD and with pharmacist to see if a therapeutic dosing of Lovenox could be achieved with a q [place] injection.  MD in agreement, however, patient continues to state does not want medication given in the morning.

## 2017-10-02 NOTE — Progress Notes (Addendum)
PROGRESS NOTE  Gary Hicks XFG:[id number] DOB: 08-Nov-1985 DOA: 09/11/2017 PCP: Patient, [name] Per  HPI/Recap of past 2 hours: 32 year old male, active IVDA, who presents with generalized pain. Patient was incarcerated and released 3-4 weeks ago. Patient admits using IV methamphetamine as well as heroin in the past 24 hours prior to admission. Reports global polyarthralgias including his bilateral feet bilateral knees bilateral hips and shoulders. Also reports fevers and chills generalized myalgias. Pt admitted for further management. 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 Pt was also diagnosed with DVT Right gastrocnemius vein . He was started on Lovenox 2/18.   Subjective;  Still complaining of severe hips pain.    Assessment/Plan: Principal Problem:   Bacteremia due to Staphylococcus aureus Active Problems:   Heroin abuse (HCC)   Polysubstance abuse (HCC)   Severe sepsis (HCC)   Cigarette smoker   Hypokalemia   DIC (disseminated intravascular coagulation) (HCC)   Staphylococcal arthritis of right shoulder (HCC)   Septic infrapatellar bursitis of left knee   Staphylococcal arthritis of left hip (HCC)   Staphylococcal arthritis of right hip (HCC)   Endocarditis of tricuspid valve   Calf abscess   Pain   Pyomyositis   Saphenous vein thrombophlebitis, right   Anxiety  Tricuspid valve endocarditis with MSSA bacteremia Blood culture 2/11 grew MSSA, repeat on 2/15 NGTD ECHO showed: Tricuspid valve: There was a large, 1.1 cm (W) x 1.8 cm (L), multilobulated, highly mobile vegetation on the right ventricular aspect of the anterior leaflet. There was moderate-severe regurgitation Continue IV Ancef for 6 weeks from 2/23-->11/05/16 as per ID ID following Pt will most likely remain in the hospital the entire time as pt in on suboxone and cant go to SNF  Afebrile.   Metastatic infection including septic L knee joint, septic hip  infection.  -S/P I&D of L Knee prepatellar bursitis on 09/23/17 . Cultures from I&D, NGTD -MRI left knee: 4.8 x 0.8 x 4 cm fluid collection in subcutaneous fat anterior to patellar tendon with severe surrounding soft tissue edema concerning for septic bursitis -MRI initial  hip: No drainable fluid collection or abscess. -MRI Right shoulder: Mild supraspinatus and infraspinatus muscle edema suggestive of Myositis -MRI brain negative for abscess -MRI C spine: Motion degraded exam. C2-3 through C7-T1 no bulge, stenosis or foraminal narrowing. Mild C3-4 through C5-6 facet arthropathy  -MRI R femur: currently pending as pt refused -Doppler left arm: Negative for DVT underwent bursectomy and I and D by Dr Percell Miller 2-25.  Ortho saw patient post op. Patient will need to follow up with ortho in 10 days.  X ray; degenerative changes of hips  -He continue to complain of worsening hips pain, MRI done showed Septic arthritis and 13 mm fluid collection in the posterior aspect of the left obturator internus muscle most concerning for a small abscess. Ortho consulted 3-03. Received lovenox today.   I was able to contact Dr.  Zenovia Jarred, who is covering for Dr Percell Miller. Patient stable, no septic. Plan to hold Lovenox tomorrow morning also will make patient NPO in case of procedure.  Lightheaded, SOB;  Check oxygen sat and orthostatic vitals.  Hb stable.  Check vitamin D level.  IV fluids.   Malnutrition; report poor appetite. Ensure. Nutrition consulted.   IVDA  Dr Damita Dunnings managing Suboxone BID  No high dose IV benzo unless required for seizure  GAD Psych consulted: start zoloft 50 mg daily May continue Klonopin prn for 2  weeks and wean off, not advised for long term.  Please refer to note dated 2/22 by psych attending for more info  Seizures Likely related to drug use Seizure precautions PRN Ativan MRI negative  Iron def anemia Continue PO iron Hb stable.   DVT right Gastrocnemius vein Also  superficial saphenous thrombophlebitis.  Continue Lovenox per pharmacy Hb stable. Platelet normal;   Thrombocytopenia Resolved  Hyponatremia Fluctuates.  Daily BMP   Hepatitis c antibody positive ID on board  Hypokalemia; resolved.   Code Status: Full  Family Communication: None at bedside  Disposition Plan: TBD   Consultants:  ID  Orthopedics  Procedures:  I&D of L knee 09/23/17  Antimicrobials:  IV Ancef  DVT prophylaxis:  Therapeutic lovenox   Objective: Vitals:   10/01/17 1019 10/01/17 1829 10/01/17 2042 10/02/17 0500  BP: 110/76 112/74 92/68 105/70  Pulse: (!) 104 (!) 103 (!) 116 (!) 114  Resp: 18 20 17 18   Temp: 98.6 F (37 C) 98.2 F (36.8 C) 99.2 F (37.3 C) 98.7 F (37.1 C)  TempSrc: Oral Oral Oral Oral  SpO2: 99% 100% 96% 97%  Weight:      Height:        Intake/Output Summary (Last 24 hours) at 10/02/2017 0827 Last data filed at 10/02/2017 3818 Gross per 24 hour  Intake 836 ml  Output 1325 ml  Net -489 ml   Filed Weights   09/21/17 2056 09/22/17 2110 09/23/17 1328  Weight: 73.1 kg (161 lb 1.1 oz) 74 kg (163 lb 2.3 oz) 74 kg (163 lb 2.3 oz)    Exam:   General: NAD, Chronic ill appearing. Thin   Cardiovascular: S 1, S 2 RRR  Respiratory:  CTA  Abdomen; BS present, soft, nt  Musculoskeletal: left Knee with dressing.   Skin: No rash.   Psychiatry: Flat affect.   Data Reviewed: CBC: Recent Labs  Lab 09/26/17 0349 09/28/17 0402 09/29/17 0413 09/30/17 0406 10/02/17 0439  WBC 7.2 6.4 8.1 9.5 9.1  NEUTROABS 4.9 3.7  --   --   --   HGB 10.0* 9.5* 10.0* 9.5* 10.0*  HCT 30.3* 29.1* 30.5* 29.2* 30.1*  MCV 95.9 95.7 95.6 95.4 95.0  PLT 314 286 301 297 299   Basic Metabolic Panel: Recent Labs  Lab 09/26/17 0349 09/28/17 0402 09/29/17 0413 09/30/17 0406  NA 135 133* 133* 132*  K 3.9 3.9 3.4* 4.1  CL 96* 96* 95* 95*  CO2 29 29 28 27   GLUCOSE 111* 113* 117* 110*  BUN 9 7 7 12   CREATININE 0.63 0.54* 0.63 0.62    CALCIUM 8.8* 8.7* 8.7* 8.6*   GFR: Estimated Creatinine Clearance: 133.8 mL/min (by C-G formula based on SCr of 0.62 mg/dL). Liver Function Tests: No results for input(s): AST, ALT, ALKPHOS, BILITOT, PROT, ALBUMIN in the last 168 hours. No results for input(s): LIPASE, AMYLASE in the last 168 hours. No results for input(s): AMMONIA in the last 168 hours. Coagulation Profile: No results for input(s): INR, PROTIME in the last 168 hours. Cardiac Enzymes: No results for input(s): CKTOTAL, CKMB, CKMBINDEX, TROPONINI in the last 168 hours. BNP (last 3 results) No results for input(s): PROBNP in the last 8760 hours. HbA1C: No results for input(s): HGBA1C in the last 72 hours. CBG: No results for input(s): GLUCAP in the last 168 hours. Lipid Profile: No results for input(s): CHOL, HDL, LDLCALC, TRIG, CHOLHDL, LDLDIRECT in the last 72 hours. Thyroid Function Tests: No results for input(s): TSH, T4TOTAL, FREET4, T3FREE, THYROIDAB in  the last 72 hours. Anemia Panel: No results for input(s): VITAMINB12, FOLATE, FERRITIN, TIBC, IRON, RETICCTPCT in the last 72 hours. Urine analysis:    Component Value Date/Time   COLORURINE YELLOW 09/10/2017 1657   APPEARANCEUR CLEAR 09/10/2017 1657   LABSPEC 1.019 09/10/2017 1657   PHURINE 6.0 09/10/2017 1657   GLUCOSEU NEGATIVE 09/10/2017 1657   HGBUR NEGATIVE 09/10/2017 1657   BILIRUBINUR NEGATIVE 09/10/2017 1657   KETONESUR NEGATIVE 09/10/2017 1657   PROTEINUR NEGATIVE 09/10/2017 1657   UROBILINOGEN 0.2 05/16/2012 0847   NITRITE NEGATIVE 09/10/2017 1657   LEUKOCYTESUR NEGATIVE 09/10/2017 1657   Sepsis Labs: @LABRCNTIP (procalcitonin:4,lacticidven:4)  ) Recent Results (from the past 240 hour(s))  Surgical pcr screen     Status: Abnormal   Collection Time: 09/23/17 12:03 AM  Result Value Ref Range Status   MRSA, PCR NEGATIVE NEGATIVE Final   Staphylococcus aureus POSITIVE (A) NEGATIVE Final    Comment: (NOTE) The Xpert SA Assay (FDA approved  for NASAL specimens in patients 1 years of age and older), is one component of a comprehensive surveillance program. It is not intended to diagnose infection nor to guide or monitor treatment. Performed at [facility], [place] [address] [place], [place] [address]   Aerobic/Anaerobic Culture (surgical/deep wound)     Status: None   Collection Time: 09/23/17  2:55 PM  Result Value Ref Range Status   Specimen Description ABSCESS  Final   Special Requests PREPATELLA BURSA  Final   Gram Stain   Final    FEW WBC PRESENT, PREDOMINANTLY PMN NO ORGANISMS SEEN    Culture   Final    No growth aerobically or anaerobically. Performed at [facility], [place] [address] [place], [place] [address]    Report Status 09/28/2017 FINAL  Final      Studies: Dg Hips Bilat With Pelvis 2v  Result Date: 10/01/2017 CLINICAL DATA:  Hip pain. EXAM: DG HIP (WITH OR WITHOUT PELVIS) 2V BILAT COMPARISON:  None. FINDINGS: There are degenerative changes in the left hip with an osteophyte off the femoral neck. No significant loss of joint space. No fractures or dislocations identified. No other acute abnormalities. 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  No other abnormalities. Electronically Signed   By: Dorise Bullion III M.D   On: 10/01/2017 12:46    Scheduled Meds: . buprenorphine-naloxone  1 tablet Sublingual BID  . enoxaparin (LOVENOX) injection  70 mg Subcutaneous BID  . famotidine  20 mg Oral Daily  . feeding supplement (ENSURE ENLIVE)  237 mL Oral TID BM  . ferrous sulfate  325 mg Oral BID WC  . nicotine  14 mg Transdermal Daily  . potassium chloride  40 mEq Oral BID  . sertraline  50 mg Oral Daily    Continuous Infusions: . sodium chloride    .  ceFAZolin (ANCEF) IV 2 g (10/02/17 0625)  . lactated ringers 10 mL/hr at 09/23/17 1329     LOS: 21 days     Elmarie Shiley, MD [phone] [facility]   If 7PM-7AM, please contact  night-coverage [email or web address] Password [facility] 10/02/2017, 8:27 AM

## 2017-10-02 NOTE — Progress Notes (Signed)
ANTICOAGULATION CONSULT NOTE  Pharmacy Consult for Lovenox Indication: DVT  Patient Measurements: Height: 5\' 9"  (175.3 cm) Weight: 163 lb 2.3 oz (74 kg) IBW/kg (Calculated) : 70.7 Lovenox Dosing Weight: 70 kg  Labs: Recent Labs    09/30/17 0406 10/02/17 0439  HGB 9.5* 10.0*  HCT 29.2* 30.1*  PLT 297 327  CREATININE 0.62  --    Estimated Creatinine Clearance: 133.8 mL/min (by C-G formula based on SCr of 0.62 mg/dL).  Assessment: 38 yoM with history of recurrent IVDU admitted with MSSA bacteremia and metastatic infection including tricuspid valve endocarditis, septic knee bursitis, septic thrombophlebitis of the saphenous vein. Bilateral lower extremity venous duplex shows evidence of acute DVT of the right lower extremity.   Pt is refusing lovenox BID injections (see nursing note 3/3) and was becoming agitated. I spoke with the patient after talking to both his nurse and MD. I explained the bleeding risks and risk should a clot travel to his lungs. He is agreeable to once daily injections at 1000.   Goal of Therapy:  Monitor platelets by anticoagulation protocol: Yes   Plan:  Lovenox 110 mg SQ q24h Monitor CBC, renal function Monitor for signs/symptoms of bleeding F/U change to an oral agent - ortho to evaluate knee   Patterson Hammersmith PharmD PGY1 Pharmacy Practice Resident 10/02/2017 9:24 AM Pager: [phone] Phone: [phone]

## 2017-10-03 LAB — CBC
HCT: 28.3 % — ABNORMAL LOW (ref 39.0–52.0)
Hemoglobin: 9.2 g/dL — ABNORMAL LOW (ref 13.0–17.0)
MCH: 31 pg (ref 26.0–34.0)
MCHC: 32.5 g/dL (ref 30.0–36.0)
MCV: 95.3 fL (ref 78.0–100.0)
PLATELETS: 268 10*3/uL (ref 150–400)
RBC: 2.97 MIL/uL — AB (ref 4.22–5.81)
RDW: 13.2 % (ref 11.5–15.5)
WBC: 8.9 10*3/uL (ref 4.0–10.5)

## 2017-10-03 LAB — PROTIME-INR
INR: 1.27
Prothrombin Time: 15.8 seconds — ABNORMAL HIGH (ref 11.4–15.2)

## 2017-10-03 LAB — VITAMIN D 25 HYDROXY (VIT D DEFICIENCY, FRACTURES): Vit D, 25-Hydroxy: 16.7 ng/mL — ABNORMAL LOW (ref 30.0–100.0)

## 2017-10-03 LAB — BASIC METABOLIC PANEL
ANION GAP: 8 (ref 5–15)
BUN: 7 mg/dL (ref 6–20)
CO2: 29 mmol/L (ref 22–32)
Calcium: 8.6 mg/dL — ABNORMAL LOW (ref 8.9–10.3)
Chloride: 96 mmol/L — ABNORMAL LOW (ref 101–111)
Creatinine, Ser: 0.61 mg/dL (ref 0.61–1.24)
GFR calc Af Amer: >60 mL/min (ref >60)
GLUCOSE: 113 mg/dL — AB (ref 65–99)
POTASSIUM: 4 mmol/L (ref 3.5–5.1)
Sodium: 133 mmol/L — ABNORMAL LOW (ref 135–145)

## 2017-10-03 MED ORDER — CHLORHEXIDINE GLUCONATE CLOTH 2 % EX PADS
6.0000 | MEDICATED_PAD | Freq: Every day | CUTANEOUS | Status: AC
  Administered 2017-10-05 – 2017-10-07 (×2): 6 via TOPICAL

## 2017-10-03 MED ORDER — VITAMIN D (ERGOCALCIFEROL) 1.25 MG (50000 UNIT) PO CAPS
50000.0000 [IU] | ORAL_CAPSULE | ORAL | Status: DC
  Administered 2017-10-03 – 2017-10-31 (×5): 50000 [IU] via ORAL
  Filled 2017-10-03 (×5): qty 1

## 2017-10-03 MED ORDER — FENTANYL CITRATE (PF) 100 MCG/2ML IJ SOLN
INTRAMUSCULAR | Status: AC
  Filled 2017-10-03: qty 6

## 2017-10-03 MED ORDER — LIDOCAINE HCL 1 % IJ SOLN
INTRAMUSCULAR | Status: AC
  Filled 2017-10-03: qty 20

## 2017-10-03 MED ORDER — ADULT MULTIVITAMIN W/MINERALS CH
1.0000 | ORAL_TABLET | Freq: Every day | ORAL | Status: DC
  Administered 2017-10-03 – 2017-11-05 (×33): 1 via ORAL
  Filled 2017-10-03 (×33): qty 1

## 2017-10-03 MED ORDER — MUPIROCIN 2 % EX OINT
1.0000 "application " | TOPICAL_OINTMENT | Freq: Two times a day (BID) | CUTANEOUS | Status: AC
  Administered 2017-10-03 – 2017-10-07 (×6): 1 via NASAL
  Filled 2017-10-03 (×2): qty 22

## 2017-10-03 NOTE — Progress Notes (Signed)
PT Cancellation Note  Patient Details Name: Durelle Zepeda Platner MRN: [id number] DOB: 1986/02/14   Cancelled Treatment:    Reason Eval/Treat Not Completed: Patient at procedure or test/unavailable   Off the floor for CT guided aspiration of left hip effusion and obturator abscess  Plan to return for PT session tomorrow;   Roney Marion, [place] Pager [phone] Office [phone]    Colletta [place] 10/03/2017, 3:49 PM

## 2017-10-03 NOTE — Progress Notes (Signed)
Patient ID: Gary Hicks, male   DOB: 04-08-1986, 32 y.o.   MRN: [id number] Patient was scheduled for image guided aspiration of left hip and fluid collection near left ischial tuberosity and left obturator internus.  Patient is very anxious and wants to be completely asleep for the procedure.  Discussed with Dr. Percell Miller of Ortho and planned to only aspirate the small posterior collection because he is scheduled for clean out both hips in OR.  Discussed with patient and he was initially agreeable to the procedure.  However, patient refused to lie on his side or prone and I was unable to get access to the small posterior collection.   Unfortunately, we were unable to aspirate any of the pelvic fluid.

## 2017-10-03 NOTE — Progress Notes (Signed)
PROGRESS NOTE  Gary Hicks YIR:[id number] DOB: 1986/02/12 DOA: 09/11/2017 PCP: Patient, [name] Per  HPI/Recap of past 2 hours: 32 year old male, active IVDA, who presents with generalized pain. Patient was incarcerated and released 3-4 weeks ago. Patient admits using IV methamphetamine as well as heroin in the past 24 hours prior to admission. Reports global polyarthralgias including his bilateral feet bilateral knees bilateral hips and shoulders. Also reports fevers and chills generalized myalgias. Pt admitted for further management. 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 Pt was also diagnosed with DVT Right gastrocnemius vein . He was started on Lovenox 2/18.   Subjective;  He complaints of hip pain. Denies chest pain or dyspnea.    Assessment/Plan: Principal Problem:   Bacteremia due to Staphylococcus aureus Active Problems:   Heroin abuse (HCC)   Polysubstance abuse (HCC)   Severe sepsis (HCC)   Cigarette smoker   Hypokalemia   DIC (disseminated intravascular coagulation) (HCC)   Staphylococcal arthritis of right shoulder (HCC)   Septic infrapatellar bursitis of left knee   Staphylococcal arthritis of left hip (HCC)   Staphylococcal arthritis of right hip (HCC)   Endocarditis of tricuspid valve   Calf abscess   Pain   Pyomyositis   Saphenous vein thrombophlebitis, right   Anxiety  Tricuspid valve endocarditis with MSSA bacteremia Blood culture 2/11 grew MSSA, repeat on 2/15 NGTD ECHO showed: Tricuspid valve: There was a large, 1.1 cm (W) x 1.8 cm (L), multilobulated, highly mobile vegetation on the right ventricular aspect of the anterior leaflet. There was moderate-severe regurgitation Continue IV Ancef for 6 weeks from 2/23-->11/05/16 as per ID ID following Pt will most likely remain in the hospital the entire time as pt in on suboxone and cant go to SNF  Afebrile.   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  -S/P I&D of L Knee prepatellar bursitis on 09/23/17 . Cultures from I&D, NGTD -MRI left knee: 4.8 x 0.8 x 4 cm fluid collection in subcutaneous fat anterior to patellar tendon with severe surrounding soft tissue edema concerning for septic bursitis -MRI initial  hip: No drainable fluid collection or abscess. -MRI Right shoulder: Mild supraspinatus and infraspinatus muscle edema suggestive of Myositis -MRI brain negative for abscess -MRI C spine: Motion degraded exam. C2-3 through C7-T1 no bulge, stenosis or foraminal narrowing. Mild C3-4 through C5-6 facet arthropathy  -MRI R femur: currently pending as pt refused -Doppler left arm: Negative for DVT underwent bursectomy and I and D by Dr Percell Miller 2-25.  Ortho saw patient post op. Patient will need to follow up with ortho in 10 days.  X ray; degenerative changes of hips  -He complain of worsening hips pain 3-02, MRI done showed Septic arthritis and 13 mm fluid collection in the posterior aspect of the left obturator internus muscle most concerning for a small abscess. Ortho consulted 3-03. Recommend hips aspiration and abscess aspiration.  Hold Lovenox in anticipation for procedure. Ortho planning to take patient to OR for wash out of hip.    Lightheaded, SOB;  Check oxygen sat and orthostatic vitals.  Hb stable.  vitamin D level low at 16. Will start supplement.  IV fluids.   Malnutrition; report poor appetite. Ensure. Nutrition consulted.   IVDA  Dr Damita Dunnings managing Suboxone BID  No high dose IV benzo unless required for seizure  [facility] Psych consulted: start zoloft 50 mg daily May continue Klonopin prn for 2 weeks and wean  off, not advised for long term.  Please refer to note dated 2/22 by psych attending for more info  Seizures Likely related to drug use Seizure precautions PRN Ativan MRI negative  Iron def anemia Continue PO iron Hb stable.   DVT right Gastrocnemius vein Also superficial saphenous  thrombophlebitis.  Holding lovenox for procedure today. Needs to discussed with ortho when to resume anticoagulation.  Hb stable. Platelet normal;   Thrombocytopenia Resolved  Hyponatremia Fluctuates.  Daily BMP   Hepatitis c antibody positive ID on board  Hypokalemia; resolved.   Code Status: Full  Family Communication: None at bedside  Disposition Plan: TBD   Consultants:  ID  Orthopedics  Procedures:  I&D of L knee 09/23/17  Antimicrobials:  IV Ancef  DVT prophylaxis:  Therapeutic lovenox   Objective: Vitals:   10/02/17 1725 10/02/17 2030 10/03/17 0637 10/03/17 0752  BP: 95/74 100/63 93/65 104/68  Pulse: (!) 104   (!) 107  Resp: 18 20 18 16   Temp: 98.6 F (37 C) 99.5 F (37.5 C) 98.7 F (37.1 C) 98.6 F (37 C)  TempSrc: Oral Oral Oral Oral  SpO2: 98% 97% 97% 98%  Weight:      Height:        Intake/Output Summary (Last 24 hours) at 10/03/2017 1338 Last data filed at 10/03/2017 1100 Gross per 24 hour  Intake 4026.67 ml  Output 2325 ml  Net 1701.67 ml   Filed Weights   09/21/17 2056 09/22/17 2110 09/23/17 1328  Weight: 73.1 kg (161 lb 1.1 oz) 74 kg (163 lb 2.3 oz) 74 kg (163 lb 2.3 oz)    Exam:   General: NAD, chronic ill appearing.   Cardiovascular: S 1, S 2 RRR  Respiratory:  CTA  Abdomen;BS present, soft, nt  Musculoskeletal:Left Knee with dressing.   Skin: Nor rash  Psychiatry: Flat affect   Data Reviewed: CBC: Recent Labs  Lab 09/28/17 0402 09/29/17 0413 09/30/17 0406 10/02/17 0439 10/03/17 0427  WBC 6.4 8.1 9.5 9.1 8.9  NEUTROABS 3.7  --   --   --   --   HGB 9.5* 10.0* 9.5* 10.0* 9.2*  HCT 29.1* 30.5* 29.2* 30.1* 28.3*  MCV 95.7 95.6 95.4 95.0 95.3  PLT 286 301 297 327 470   Basic Metabolic Panel: Recent Labs  Lab 09/28/17 0402 09/29/17 0413 09/30/17 0406 10/03/17 0427  NA 133* 133* 132* 133*  K 3.9 3.4* 4.1 4.0  CL 96* 95* 95* 96*  CO2 29 28 27 29   GLUCOSE 113* 117* 110* 113*  BUN 7 7 12 7     CREATININE 0.54* 0.63 0.62 0.61  CALCIUM 8.7* 8.7* 8.6* 8.6*   GFR: Estimated Creatinine Clearance: 133.8 mL/min (by C-G formula based on SCr of 0.61 mg/dL). Liver Function Tests: No results for input(s): AST, ALT, ALKPHOS, BILITOT, PROT, ALBUMIN in the last 168 hours. No results for input(s): LIPASE, AMYLASE in the last 168 hours. No results for input(s): AMMONIA in the last 168 hours. Coagulation Profile: Recent Labs  Lab 10/03/17 0905  INR 1.27   Cardiac Enzymes: No results for input(s): CKTOTAL, CKMB, CKMBINDEX, TROPONINI in the last 168 hours. BNP (last 3 results) No results for input(s): PROBNP in the last 8760 hours. HbA1C: No results for input(s): HGBA1C in the last 72 hours. CBG: No results for input(s): GLUCAP in the last 168 hours. Lipid Profile: No results for input(s): CHOL, HDL, LDLCALC, TRIG, CHOLHDL, LDLDIRECT in the last 72 hours. Thyroid Function Tests: No results for input(s): TSH, T4TOTAL,  FREET4, T3FREE, THYROIDAB in the last 72 hours. Anemia Panel: No results for input(s): VITAMINB12, FOLATE, FERRITIN, TIBC, IRON, RETICCTPCT in the last 72 hours. Urine analysis:    Component Value Date/Time   COLORURINE YELLOW 09/10/2017 [place] 09/10/2017 1657   LABSPEC 1.019 09/10/2017 1657   PHURINE 6.0 09/10/2017 1657   GLUCOSEU NEGATIVE 09/10/2017 1657   HGBUR NEGATIVE 09/10/2017 1657   BILIRUBINUR NEGATIVE 09/10/2017 1657   KETONESUR NEGATIVE 09/10/2017 1657   PROTEINUR NEGATIVE 09/10/2017 1657   UROBILINOGEN 0.2 05/16/2012 0847   NITRITE NEGATIVE 09/10/2017 1657   LEUKOCYTESUR NEGATIVE 09/10/2017 1657   Sepsis Labs: @LABRCNTIP (procalcitonin:4,lacticidven:4)  ) Recent Results (from the past 240 hour(s))  Aerobic/Anaerobic Culture (surgical/deep wound)     Status: None   Collection Time: 09/23/17  2:55 PM  Result Value Ref Range Status   Specimen Description ABSCESS  Final   Special Requests PREPATELLA BURSA  Final   Gram Stain    Final    FEW WBC PRESENT, PREDOMINANTLY PMN NO ORGANISMS SEEN    Culture   Final    No growth aerobically or anaerobically. Performed at [facility], [place] [address] [place], [place] [address]    Report Status 09/28/2017 FINAL  Final      Studies: No results found.  Scheduled Meds: . buprenorphine-naloxone  1 tablet Sublingual BID  . famotidine  20 mg Oral Daily  . feeding supplement (ENSURE ENLIVE)  237 mL Oral TID BM  . ferrous sulfate  325 mg Oral BID WC  . nicotine  14 mg Transdermal Daily  . potassium chloride  40 mEq Oral BID  . sertraline  50 mg Oral Daily    Continuous Infusions: . sodium chloride 100 mL/hr at 10/03/17 0445  .  ceFAZolin (ANCEF) IV 2 g (10/03/17 1315)  . lactated ringers 10 mL/hr at 09/23/17 1329     LOS: 22 days     Elmarie Shiley, MD [phone] [facility]   If 7PM-7AM, please contact night-coverage [email or web address] Password [email or web address] 10/03/2017, 1:38 PM

## 2017-10-03 NOTE — Progress Notes (Signed)
Nutrition Follow-up  DOCUMENTATION CODES:   Not applicable  INTERVENTION:   -Add MVI daily  -Continue Ensure Enlive po BID, each supplement provides 350 kcal and 20 grams of protein  -Snacks between meals  -Pt on Regular diet, encourage family/friends to bring in food for patient if patient prefers this to hospital food  NUTRITION DIAGNOSIS:   Increased nutrient needs related to acute illness as evidenced by estimated needs.  Continues   GOAL:   Patient will meet greater than or equal to 90% of their needs  Not met  MONITOR:   PO intake, Supplement acceptance, Labs, Weight trends  REASON FOR ASSESSMENT:   Malnutrition Screening Tool    ASSESSMENT:   32 yo male admitted severe sepsis with bacterial endocarditis, MSSA bacteremia. Pt with active IV drug abuse (meth and heroin)  Pt with metastatic infection including septic L knee joint, septic hip infection 2/22 I&D of L knee prepatellar bursitis  Ortho planning to take patient to OR for wash out of hip  Noted Vitamin D levels low, supplements started per MD  Pt reports appetite remains poor; not eating much from meal trays but drinking 2 Ensure Enlive per [place]aley. Per I/O flow sheet, pt ate 25% at dinner last night, 100% at lunch and 0% at breakfast. Pt reports despite being on Regular diet he does not really like the hospital food. Reviewed "or you may prefer" menu that includes hamburgers and sandwiches. Pt does indicate that family/friends bring in outside food sometimes and pt does eat this.   Weight relatively stable since 2/13. Current wt 163 pounds, weight of 162 pounds on 2/13. Noted wt of 141 pounds on admission but weight has been in the 160s since.   Labs: sodium 133 Meds: reviewed  Diet Order:  Seizure precautions Diet NPO time specified Except for: Sips with Meds, Other (See Comments)  EDUCATION NEEDS:   Education needs have been addressed  Skin:  Skin Assessment: Reviewed RN Assessment  Last BM:   Unknown LBM date  Height:   Ht Readings from Last 1 Encounters:  09/23/17 '5\' 9"'  (1.753 m)    Weight:   Wt Readings from Last 1 Encounters:  09/23/17 163 lb 2.3 oz (74 kg)    Ideal Body Weight:  72.7 kg  BMI:  Body mass index is 24.09 kg/m.  Estimated Nutritional Needs:   Kcal:  2200-2400 kcals  Protein:  110-120 g  Fluid:  >/= 2 L  Kerman Passey MS, RD, LDN, CNSC [phone] Pager  [phone] Weekend/On-Call Pager

## 2017-10-03 NOTE — Progress Notes (Signed)
Spoke with nurse regarding pts NPO status. Documented 278ml at 11am. Nurse states he drank 275mls with his meds.

## 2017-10-03 NOTE — Progress Notes (Signed)
Chief Complaint: Patient was seen in consultation today for hip effusion at the request of Dr. Niel Hummer  Referring Physician(s): Dr. Tyrell Antonio  Supervising Physician: Markus Daft  Patient Status: [facility] - In-pt  History of Present Illness: Gary Hicks is a 32 y.o. male 72 yoM with history of recurrent IVDU admitted with MSSA bacteremia and metastatic infection including tricuspid valve endocarditis, septic knee bursitis, septic thrombophlebitis of the saphenous vein. He also c/o bilateral hip pain. MRI finds left hip effusion with associated osteomyelitis changes and small obturator abscess. After discussion with ortho, IM service has requested image guided aspiration of these collections. Chart, imaging, meds reviewed.     Past Medical History:  Diagnosis Date  . Bacterial endocarditis   . Heroin abuse ([place])   . Liver disease    possible Hep. C no treatment or confirmation   . Opiate abuse, continuous ([place])   . Polysubstance abuse ([place])   . Seizures ([place])     Past Surgical History:  Procedure Laterality Date  . CHOLECYSTECTOMY    . HAND SURGERY    . I&D EXTREMITY Left 09/23/2017   Procedure: IRRIGATION AND DEBRIDEMENT KNEE;  Surgeon: Renette Butters, MD;  Location: [facility];  Service: Orthopedics;  Laterality: Left;    Allergies: Prednisone and Amoxicillin  Medications:  Current Facility-Administered Medications:  .  0.9 %  sodium chloride infusion, , Intravenous, Continuous, Regalado, Belkys A, MD, Last Rate: 100 mL/hr at 10/03/17 0445 .  acetaminophen (TYLENOL) tablet 1,000 mg, 1,000 mg, Oral, Q6H PRN, Regalado, Belkys A, MD, 1,000 mg at 10/02/17 0846 .  alum & mag hydroxide-simeth (MAALOX/MYLANTA) 200-200-20 MG/5ML suspension 30 mL, 30 mL, Oral, Q6H PRN, Regalado, Belkys A, MD, 30 mL at 09/15/17 8527 .  buprenorphine-naloxone (SUBOXONE) 8-2 mg per SL tablet 1 tablet, 1 tablet, Sublingual, BID, Axel Filler, MD, 1 tablet at 10/03/17 1000 .   ceFAZolin (ANCEF) IVPB 2g/100 mL premix, 2 g, Intravenous, Q8H, Carlyle Basques, MD, Stopped at 10/03/17 [phone] .  clonazePAM (KLONOPIN) disintegrating tablet 0.25 mg, 0.25 mg, Oral, BID PRN, Alma Friendly, MD, 0.25 mg at 10/03/17 0634 .  famotidine (PEPCID) tablet 20 mg, 20 mg, Oral, Daily, Regalado, Belkys A, MD, 20 mg at 10/03/17 1000 .  feeding supplement (ENSURE ENLIVE) (ENSURE ENLIVE) liquid 237 mL, 237 mL, Oral, TID BM, Alma Friendly, MD, 237 mL at 10/03/17 0959 .  ferrous sulfate tablet 325 mg, 325 mg, Oral, BID WC, Regalado, Belkys A, MD, 325 mg at 10/03/17 1000 .  hydrOXYzine (ATARAX/VISTARIL) tablet 25 mg, 25 mg, Oral, TID PRN, Regalado, Belkys A, MD .  lactated ringers infusion, , Intravenous, Continuous, Montez Hageman, MD, Last Rate: 10 mL/hr at 09/23/17 1329 .  nicotine (NICODERM CQ - dosed in mg/24 hours) patch 14 mg, 14 mg, Transdermal, Daily, Crosley, Debby, MD, 14 mg at 09/24/17 1224 .  ondansetron (ZOFRAN) tablet 4 mg, 4 mg, Oral, Q6H PRN **OR** ondansetron (ZOFRAN) injection 4 mg, 4 mg, Intravenous, Q6H PRN, Crosley, Debby, MD, 4 mg at 10/01/17 1122 .  oxyCODONE (Oxy IR/ROXICODONE) immediate release tablet 5 mg, 5 mg, Oral, Q6H PRN, Axel Filler, MD, 5 mg at 10/03/17 [phone] .  polyethylene glycol (MIRALAX / GLYCOLAX) packet 17 g, 17 g, Oral, Daily PRN, Crosley, Debby, MD .  potassium chloride SA (K-DUR,KLOR-CON) CR tablet 40 mEq, 40 mEq, Oral, BID, Regalado, Belkys A, MD, 40 mEq at 09/30/17 2203 .  sertraline (ZOLOFT) tablet 50 mg, 50 mg, Oral, Daily, Faythe Dingwall,  DO, 50 mg at 10/03/17 1000 .  sodium chloride (OCEAN) 0.65 % nasal spray 1 spray, 1 spray, Each Nare, PRN, Blount, Xenia T, NP .  sodium chloride flush (NS) 0.9 % injection 10-40 mL, 10-40 mL, Intracatheter, PRN, Alma Friendly, MD, 20 mL at 09/30/17 0421 .  traZODone (DESYREL) tablet 50 mg, 50 mg, Oral, QHS PRN, Faythe Dingwall, DO, 50 mg at 10/02/17 2231    History reviewed.  No pertinent family history.  Social History   Socioeconomic History  . Marital status: Married    Spouse name: None  . Number of children: None  . Years of education: None  . Highest education level: None  Social Needs  . Financial resource strain: None  . Food insecurity - worry: None  . Food insecurity - inability: None  . Transportation needs - medical: None  . Transportation needs - non-medical: None  Occupational History  . None  Tobacco Use  . Smoking status: Current Every Hamberger Smoker    Packs/[place]: 0.50    Types: Cigarettes  . Smokeless tobacco: Current User  Substance and Sexual Activity  . Alcohol use: No  . Drug use: Yes    Types: IV    Comment: heroin  . Sexual activity: Yes  Other Topics Concern  . None  Social History Narrative  . None     Review of Systems: A 12 point ROS discussed and pertinent positives are indicated in the HPI above.  All other systems are negative.  Review of Systems  Vital Signs: BP 104/68 (BP Location: Left Arm)   Pulse (!) 107   Temp 98.6 F (37 C) (Oral)   Resp 16   Ht 5\' 9"  (1.753 m)   Wt 163 lb 2.3 oz (74 kg)   SpO2 98%   BMI 24.09 kg/m   Physical Exam  Constitutional: He is oriented to person, place, and time. He appears well-developed. No distress.  HENT:  Head: Normocephalic.  Mouth/Throat: Oropharynx is clear and moist.  Neck: No JVD present. No tracheal deviation present.  Cardiovascular: Normal rate, regular rhythm and normal heart sounds.  Pulmonary/Chest: Effort normal and breath sounds normal. No respiratory distress.  Neurological: He is alert and oriented to person, place, and time.  Skin: Skin is warm and dry.      Imaging: Dg Shoulder Right  Result Date: 09/11/2017 CLINICAL DATA:  Pain. EXAM: RIGHT SHOULDER - 2+ VIEW COMPARISON:  None. FINDINGS: Frontal and Y scapular images were obtained. No fracture or dislocation. Joint spaces appear normal. No erosive change or bony destruction. Visualized  right lung clear. IMPRESSION: No fracture or dislocation.  No evident arthropathy. Electronically Signed   By: Lowella Grip III M.D.   On: 09/11/2017 21:12   Ct Head Wo Contrast  Result Date: 09/12/2017 CLINICAL DATA:  Seizure activity, IV drug use. EXAM: CT HEAD WITHOUT CONTRAST TECHNIQUE: Contiguous axial images were obtained from the base of the skull through the vertex without intravenous contrast. COMPARISON:  09/10/2016 FINDINGS: Brain: Ventricles are within normal. CSF spaces are unremarkable. There is mild stable prominence of the cisterna magna. No mass, mass effect, shift of midline structures or acute hemorrhage. No evidence of acute infarction. Vascular: No hyperdense vessel or unexpected calcification. Skull: Normal. Negative for fracture or focal lesion. Sinuses/Orbits: No acute finding. Other: None. IMPRESSION: No acute findings. Electronically Signed   By: Marin Olp M.D.   On: 09/12/2017 11:53   Ct Head W Or Wo Contrast  Result Date: 09/10/2017  CLINICAL DATA:  Endocarditis.  Infection suspected. EXAM: CT HEAD WITHOUT AND WITH CONTRAST TECHNIQUE: Contiguous axial images were obtained from the base of the skull through the vertex without and with intravenous contrast CONTRAST:  54mL ISOVUE-300 IOPAMIDOL (ISOVUE-300) INJECTION 61% COMPARISON:  None. FINDINGS: Brain: No acute infarct, hemorrhage, or mass lesion is present. The ventricles are of normal size. No significant extraaxial fluid collection is present. Postcontrast images demonstrate no pathologic enhancement. A mega cisterna magna is noted. Cerebellum is within normal limits. Brainstem is unremarkable. Vascular: No significant vascular lesions are present. Skull: Calvarium is intact. No focal lytic or blastic lesions are present. Sinuses/Orbits: The paranasal sinuses and mastoid air cells are clear. Globes and orbits are within normal limits. IMPRESSION: Negative CT of the head without and with contrast. No focal lesions or  enhancement to suggest septic emboli. Electronically Signed   By: San Morelle M.D.   On: 09/10/2017 20:59   Ct Angio Chest Pe W Or Wo Contrast  Result Date: 09/25/2017 CLINICAL DATA:  32 year old male with LEFT LOWER extremity thrombosis. IV drug abuser. Generalized pain. Fever chills and myalgias. EXAM: CT ANGIOGRAPHY CHEST WITH CONTRAST TECHNIQUE: Multidetector CT imaging of the chest was performed using the standard protocol during bolus administration of intravenous contrast. Multiplanar CT image reconstructions and MIPs were obtained to evaluate the vascular anatomy. CONTRAST:  121mL ISOVUE-370 IOPAMIDOL (ISOVUE-370) INJECTION 76% COMPARISON:  06/06/2016 and prior chest radiographs FINDINGS: Cardiovascular: This is a technically satisfactory study. No pulmonary emboli are identified. Old healed UPPER limits normal heart size noted. No evidence of thoracic aortic aneurysm or pericardial effusion. Mediastinum/Nodes: No enlarged mediastinal, hilar, or axillary lymph nodes. Thyroid gland, trachea, and esophagus demonstrate no significant findings. Lungs/Pleura: There are multiple irregular bilateral pulmonary nodules with index lesions as follows: An 8 x 13 mm SUPERIOR segment RIGHT LOWER lobe nodule (6:42) A 4 x 7 mm RIGHT UPPER lobe nodule (6:56) A 12 x 20 mm RIGHT LOWER lobe nodule (6:88) A 28 x 22 mm LEFT LOWER lobe nodule (6:91). These nodules are nonspecific but most likely represent infection or possibly septic emboli. Malignancy/metastatic disease is considered less likely. A small LEFT pleural effusion and LEFT basilar atelectasis noted. Upper Abdomen: No acute abnormality. Musculoskeletal: No acute abnormality or suspicious bony lesion. Review of the MIP images confirms the above findings. IMPRESSION: 1. Multiple bilateral pulmonary nodules as described. Favor infection or possibly septic emboli. Other inflammatory processes or malignancy/metastatic disease considered less likely. 2. Small  LEFT pleural effusion and LEFT basilar atelectasis 3. No evidence of pulmonary emboli or thoracic aortic aneurysm. Electronically Signed   By: Margarette Canada M.D.   On: 09/25/2017 18:42   Mr Jeri Cos VZ Contrast  Result Date: 09/18/2017 CLINICAL DATA:  Endocarditis with muscle weakness and right hand numbness. EXAM: MRI HEAD WITHOUT AND WITH CONTRAST TECHNIQUE: Multiplanar, multiecho pulse sequences of the brain and surrounding structures were obtained without and with intravenous contrast. CONTRAST:  76mL MULTIHANCE GADOBENATE DIMEGLUMINE 529 MG/ML IV SOLN COMPARISON:  Head CT 09/12/2017 FINDINGS: Brain: The midline structures are normal. There is no acute infarct or acute hemorrhage. No mass lesion, hydrocephalus, dural abnormality or extra-axial collection. Focus of hyperintense T2-weighted signal within the left frontal periventricular white matter. Otherwise normal parenchymal signal. There is atrophy greater than expected for age with ex vacuo dilatation of the lateral ventricles. 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 No chronic microhemorrhage or superficial siderosis. Vascular: Major intracranial arterial and venous sinus flow voids are preserved. Skull and  upper cervical spine: The visualized skull base, calvarium, upper cervical spine and extracranial soft tissues are normal. Sinuses/Orbits: No fluid levels or advanced mucosal thickening. No mastoid or middle ear effusion. Normal orbits. IMPRESSION: Age advanced volume loss with ex vacuo dilatation of the ventricles, but no acute abnormality. Electronically Signed   By: Ulyses Jarred M.D.   On: 09/18/2017 02:30   Mr Cervical Spine Wo Contrast  Result Date: 09/20/2017 CLINICAL DATA:  Generalized pain, hand numbness and weakness. History of endocarditis and intravenous drug abuse. EXAM: MRI CERVICAL SPINE WITHOUT CONTRAST TECHNIQUE: Multiplanar, multisequence MR imaging of the cervical spine was performed. No intravenous contrast was  administered. COMPARISON:  MRI of the head September 18, 2017 FINDINGS: ALIGNMENT: Straightened cervical lordosis.  No malalignment. VERTEBRAE/DISCS: Vertebral bodies are intact. Intervertebral disc morphology's and signal are normal. Mild C6-7 ventral endplate spurring. No abnormal or acute bone marrow signal. CORD:Cervical spinal cord is normal morphology and signal characteristics from the cervicomedullary junction to level of T1-2, the most caudal well visualized level. POSTERIOR FOSSA, VERTEBRAL ARTERIES, PARASPINAL TISSUES: No MR findings of ligamentous injury. Vertebral artery flow voids present. Included posterior fossa and paraspinal soft tissues are normal. 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 DISC LEVELS (axial sequences mild to moderately motion degraded): C2-3 through C7-T1: No disc bulge, canal stenosis nor neural foraminal narrowing. Mild C3-4 through C5-6 facet arthropathy. IMPRESSION: 1. Motion degraded examination. 2. No fracture, malalignment or acute osseous process. No MR findings of infection by noncontrast MRI. 3. Early degenerative change of the cervical spine without canal stenosis or neural foraminal narrowing. Electronically Signed   By: Elon Alas M.D.   On: 09/20/2017 23:16   Mr Hip Right W Wo Contrast  Result Date: 10/02/2017 CLINICAL DATA:  Bilateral hip and back pain.  Left worse than right. EXAM: MRI OF THE LEFT HIP WITHOUT AND WITH CONTRAST TECHNIQUE: Multiplanar, multisequence MR imaging was performed both before and after administration of intravenous contrast. CONTRAST:  78mL MULTIHANCE GADOBENATE DIMEGLUMINE 529 MG/ML IV SOLN COMPARISON:  None. FINDINGS: Bones: No hip fracture, dislocation or avascular necrosis. Marrow edema in the left femoral head and acetabulum. Mild marrow edema in the anterior right acetabulum. Bilateral pericapsular edema and enhancement. No periosteal reaction or bone destruction. No aggressive osseous lesion. Normal sacrum  and sacroiliac joints. No SI joint widening or erosive changes. Articular cartilage and labrum Articular cartilage: Partial-thickness bilateral femoral heads and acetabulum, left greater than right. Small femoral inferior marginal osteophytes. Labrum: Grossly intact, but evaluation is limited by lack of intraarticular fluid. Joint or bursal effusion Joint effusion: Large left hip joint effusion with severe synovitis and enhancement. Small right hip joint effusion with synovitis and enhancement. No SI joint effusion. Bursae:  No bursa formation. Muscles and tendons Muscle edema in the left obturator internus muscle with a 13 mm fluid collection posteriorly within the muscle concerning for a small abscess. Muscle edema and mild enhancement in the adductor musculature bilaterally and right iliacus muscle. Muscle edema and enhancement of bilateral multifidus muscles. Other findings Miscellaneous: No pelvic free fluid. No fluid collection or hematoma. No inguinal lymphadenopathy. No inguinal hernia. IMPRESSION: 1. Large left hip joint effusion with enhancing synovitis, pericapsular edema and severe marrow edema on either side of the joint most concerning for septic arthritis. 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 2. Small right hip joint effusion with enhancing synovitis and pericapsular edema and enhancement most concerning for septic arthritis. 3. Muscle edema within the adductor musculature  bilaterally and multifidus muscles bilaterally most concerning for infectious myositis. 13 mm fluid collection in the posterior aspect of the left obturator internus muscle most concerning for a small abscess. Electronically Signed   By: Kathreen Devoid   On: 10/02/2017 11:36   Mr Hip Left W Wo Contrast  Result Date: 10/02/2017 CLINICAL DATA:  Bilateral hip and back pain.  Left worse than right. EXAM: MRI OF THE LEFT HIP WITHOUT AND WITH CONTRAST TECHNIQUE: Multiplanar, multisequence MR imaging was  performed both before and after administration of intravenous contrast. CONTRAST:  74mL MULTIHANCE GADOBENATE DIMEGLUMINE 529 MG/ML IV SOLN COMPARISON:  None. FINDINGS: Bones: No hip fracture, dislocation or avascular necrosis. Marrow edema in the left femoral head and acetabulum. Mild marrow edema in the anterior right acetabulum. Bilateral pericapsular edema and enhancement. No periosteal reaction or bone destruction. No aggressive osseous lesion. Normal sacrum and sacroiliac joints. No SI joint widening or erosive changes. Articular cartilage and labrum Articular cartilage: Partial-thickness bilateral femoral heads and acetabulum, left greater than right. Small femoral inferior marginal osteophytes. Labrum: Grossly intact, but evaluation is limited by lack of intraarticular fluid. Joint or bursal effusion Joint effusion: Large left hip joint effusion with severe synovitis and enhancement. Small right hip joint effusion with synovitis and enhancement. No SI joint effusion. Bursae:  No bursa formation. Muscles and tendons Muscle edema in the left obturator internus muscle with a 13 mm fluid collection posteriorly within the muscle concerning for a small abscess. Muscle edema and mild enhancement in the adductor musculature bilaterally and right iliacus muscle. Muscle edema and enhancement of bilateral multifidus muscles. Other findings Miscellaneous: No pelvic free fluid. No fluid collection or hematoma. No inguinal lymphadenopathy. No inguinal hernia. IMPRESSION: 1. Large left hip joint effusion with enhancing synovitis, pericapsular edema and severe marrow edema on either side of the joint most concerning for septic arthritis. 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 2. Small right hip joint effusion with enhancing synovitis and pericapsular edema and enhancement most concerning for septic arthritis. 3. Muscle edema within the adductor musculature bilaterally and multifidus muscles  bilaterally most concerning for infectious myositis. 13 mm fluid collection in the posterior aspect of the left obturator internus muscle most concerning for a small abscess. Electronically Signed   By: Kathreen Devoid   On: 10/02/2017 11:36   Mr Shoulder Right Wo Contrast  Result Date: 09/15/2017 CLINICAL DATA:  Fevers, chills, generalized myalgias, and polyarthralgia, involving the bilateral shoulders, hips, knees, and feet. History of IV drug abuse. EXAM: MRI OF THE RIGHT SHOULDER WITHOUT CONTRAST TECHNIQUE: Multiplanar, multisequence MR imaging of the shoulder was performed. No intravenous contrast was administered. COMPARISON:  Right shoulder x-rays dated September 11, 2017. FINDINGS: Despite efforts by the technologist and patient, motion artifact is present on today's exam and could not be eliminated. This reduces exam sensitivity and specificity. Rotator cuff: Intact supraspinatus, infraspinatus, teres minor and subscapularis tendons. Muscles: Mild edema within the supraspinatus muscle and along the posterior aspect of the infraspinatus muscle. No muscle atrophy. Biceps long head:  Intact and normally positioned. Acromioclavicular Joint: Normal acromioclavicular joint. Type II acromion. Small subacromial/subdeltoid bursal fluid. Glenohumeral Joint: No joint effusion. No chondral defect. Labrum: Grossly intact, but evaluation is limited by lack of intraarticular fluid. Bones:  No marrow abnormality, fracture or dislocation. Other: None. IMPRESSION: 1. Mild supraspinatus and infraspinatus muscle edema suggestive of myositis. 2. Mild subacromial/subdeltoid bursitis. 3. No drainable fluid collection.  No glenohumeral joint effusion. Electronically Signed   By: Gwyndolyn Saxon  Marzella Schlein M.D.   On: 09/15/2017 08:22   Mr Knee Left Wo Contrast  Result Date: 09/21/2017 CLINICAL DATA:  32 year old man with active IV drug abuse, who presents with generalized pain. History is limited secondary to his acute illness including  pain and fluctuations in alertness EXAM: MRI OF THE LEFT KNEE WITHOUT CONTRAST TECHNIQUE: Multiplanar, multisequence MR imaging of the knee was performed. No intravenous contrast was administered. COMPARISON:  None. FINDINGS: MENISCI Medial meniscus:  Intact. Lateral meniscus:  Intact. LIGAMENTS Cruciates:  Intact ACL and PCL. Collaterals: Medial collateral ligament is intact. Lateral collateral ligament complex is intact. CARTILAGE Patellofemoral:  No chondral defect. Medial:  No chondral defect. Lateral:  No chondral defect. Joint: No joint effusion. Normal Hoffa's fat. No plical thickening. Popliteal Fossa: No Baker's cyst. Intact popliteus tendon. Mild soft tissue edema superficial to the lateral gastrocnemius muscle. Mild soft tissue edema deep to the distal biceps femoris muscle. Extensor Mechanism: Intact quadriceps tendon. Intact patellar tendon. Intact medial patellar retinaculum. Intact lateral patellar retinaculum. Intact MPFL. Bones: No focal marrow signal abnormality. No fracture or dislocation. Other: 4.8 x 0.8 x 4 cm complex fluid collection in the subcutaneous fat anterior to the patellar tendon with severe surrounding soft tissue edema most concerning for bursitis which may be secondarily infected. IMPRESSION: 1. 4.8 x 0.8 x 4 cm complex fluid collection in the subcutaneous fat anterior to the patellar tendon with severe surrounding soft tissue edema most concerning for bursitis which may be secondarily infected. 2. No evidence of septic arthritis. Electronically Signed   By: Kathreen Devoid   On: 09/21/2017 09:02   Mr Hip Right Wo Contrast  Result Date: 09/12/2017 CLINICAL DATA:  32 year old male admitted with progressive joint pain since last week. Patient has been injecting meth. Pain has been migratory with bilateral ankle/feet, knee and hip involvement as well as right shoulder. Question septic arthritis. EXAM: MR OF THE RIGHT HIP WITHOUT CONTRAST TECHNIQUE: Multiplanar, multisequence MR  imaging was performed. No intravenous contrast was administered. COMPARISON:  Plain radiographs 09/11/2016 FINDINGS: Bones: Osteophytes are seen about the left femoral head-neck junction. No flattening or subchondral edema of the femoral heads. No marrow edema of the femoral heads. No fracture, joint dislocation nor suspicious osseous lesions. Articular cartilage and labrum Articular cartilage: No focal chondral defects of the left acetabular or femoral head cartilage. Labrum:  No labral tear is identified about either hip. Joint or bursal effusion Joint effusion: Small bilateral hip joint effusions without significant synovial proliferation. Bursae: None Muscles and tendons Muscles and tendons: Mild nonspecific intramuscular edema involving the pectineus, adductor and obturator externus muscles about both hips and left obturator internus. Mild gluteal edema is also noted bilaterally. Other findings Miscellaneous: Diffuse soft tissue anasarca about the included pelvis and both hips. Trace presacral edema and fluid. IMPRESSION: 1. Mild soft tissue anasarca about the pelvis and both hips. No drainable fluid collection or abscess. 2. Nonspecific mild intramuscular edema about both hips question myositis. 3. Small bilateral hip joint effusions without frank bone destruction or marrow signal abnormalities to suggest a septic joint. If clinical concern persists for septic arthritis, joint aspiration of the affected side may prove useful. Electronically Signed   By: Ashley Royalty M.D.   On: 09/12/2017 23:31   Dg Knee Left Port  Result Date: 09/11/2017 CLINICAL DATA:  Acute left knee pain following fall. Initial encounter. EXAM: PORTABLE LEFT KNEE - 1-2 VIEW COMPARISON:  None. FINDINGS: The patient was unwilling to have a lateral view performed. No  evidence of fracture or dislocation. No evidence of arthropathy or other focal bone abnormality. Soft tissues are unremarkable. IMPRESSION: No significant abnormalities on  three views. The patient was unwilling to have a lateral view performed and presence of is effusion cannot be assessed. Electronically Signed   By: Margarette Canada M.D.   On: 09/11/2017 08:45   [place] Rt Lower Extrem Ltd Soft Tissue Non Vascular  Result Date: 09/19/2017 CLINICAL DATA:  Leg pain EXAM: ULTRASOUND RIGHT LOWER EXTREMITY LIMITED TECHNIQUE: Ultrasound examination of the lower extremity soft tissues was performed in the area of clinical concern. COMPARISON:  Four days prior FINDINGS: Medial distal thigh great saphenous vein thrombosis that appears complete. The wall appears thickened as well. A smaller and more superficial subcutaneous vein is also thrombosed. There is regional subcutaneous reticulation. No neighboring abscess. IMPRESSION: Great saphenous vein thrombosis in the lower right thigh. A smaller neighboring subcutaneous vein is also thrombosed. Probable underlying thrombophlebitis. Consider DVT study to evaluate proximal extent. Electronically Signed   By: Monte Fantasia M.D.   On: 09/19/2017 09:47   [place] Rt Lower Extrem Ltd Soft Tissue Non Vascular  Result Date: 09/13/2017 CLINICAL DATA:  Palpable abnormality in right calf. EXAM: ULTRASOUND right LOWER EXTREMITY LIMITED TECHNIQUE: Ultrasound examination of the lower extremity soft tissues was performed in the area of clinical concern. COMPARISON:  None. FINDINGS: No definite mass or fluid collection or cyst is seen in the area of palpable concern in the posterior aspect of the right calf. There does appear to be thrombosed superficial vein in this area. IMPRESSION: Probable thrombosed superficial vein seen in area of palpable concern in the right calf. No mass, fluid collection or cyst is noted. Electronically Signed   By: Marijo Conception, M.D.   On: 09/13/2017 12:39   Dg Hips Bilat With Pelvis 2v  Result Date: 10/01/2017 CLINICAL DATA:  Hip pain. EXAM: DG HIP (WITH OR WITHOUT PELVIS) 2V BILAT COMPARISON:  None. FINDINGS: There are  degenerative changes in the left hip with an osteophyte off the femoral neck. No significant loss of joint space. No fractures or dislocations identified. No other acute abnormalities. 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  No other abnormalities. Electronically Signed   By: Dorise Bullion III M.D   On: 10/01/2017 12:46   Dg Hip Unilat With Pelvis 2-3 Views Right  Result Date: 09/11/2017 CLINICAL DATA:  Pain EXAM: DG HIP (WITH OR WITHOUT PELVIS) 2-3V RIGHT COMPARISON:  None. FINDINGS: Frontal pelvis as well as frontal and lateral right hip images were obtained. There is no fracture or dislocation. Joint spaces appear normal. No erosive change or bony destruction. IMPRESSION: No fracture or dislocation.  No evident arthropathy. Electronically Signed   By: Lowella Grip III M.D.   On: 09/11/2017 21:13   [place] Ekg Site Rite  Result Date: 09/25/2017 If Site Rite image not attached, placement could not be confirmed due to current cardiac rhythm.   Labs:  CBC: Recent Labs    09/29/17 0413 09/30/17 0406 10/02/17 0439 10/03/17 0427  WBC 8.1 9.5 9.1 8.9  HGB 10.0* 9.5* 10.0* 9.2*  HCT 30.5* 29.2* 30.1* 28.3*  PLT 301 297 327 268    COAGS: Recent Labs    09/11/17 0041 09/11/17 1826 10/03/17 0905  INR 1.18 1.30 1.27  APTT 41*  --   --     BMP: Recent Labs    09/28/17 0402 09/29/17 0413 09/30/17 0406 10/03/17 0427  NA 133* 133* 132* 133*  K  3.9 3.4* 4.1 4.0  CL 96* 95* 95* 96*  CO2 29 28 27 29   GLUCOSE 113* 117* 110* 113*  BUN 7 7 12 7   CALCIUM 8.7* 8.7* 8.6* 8.6*  CREATININE 0.54* 0.63 0.62 0.61  GFRNONAA >60 >60 >60 >60  GFRAA >60 >60 >60 >60    LIVER FUNCTION TESTS: Recent Labs    09/10/17 1811 09/11/17 0041 09/11/17 1826 09/14/17 0806  BILITOT 1.2 0.9 1.0 0.7  AST 45* 37 36 97*  ALT 42 34 28 46  ALKPHOS 148* 83 76 91  PROT 6.6 5.5* 5.7* 5.2*  ALBUMIN 2.6* 2.1* 1.9* 1.4*    TUMOR MARKERS: No results for input(s): AFPTM, CEA, CA199,  CHROMGRNA in the last 8760 hours.  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 Risks and benefits discussed with the patient including bleeding, infection, damage to adjacent structures, and sepsis.  All of the patient's questions were answered, patient is agreeable to proceed. Consent signed and in chart.   Thank you for this interesting consult.  I greatly enjoyed meeting Jaryn R Epps and look forward to participating in their care.  A copy of this report was sent to the requesting provider on this date.  Electronically Signed: Ascencion Dike, PA-C 10/03/2017, 11:22 AM   I spent a total of 20 minutes in face to face in clinical consultation, greater than 50% of which was counseling/coordinating care for left hip effusion/osteo

## 2017-10-04 ENCOUNTER — Encounter (HOSPITAL_COMMUNITY): Payer: Self-pay

## 2017-10-04 ENCOUNTER — Encounter (HOSPITAL_COMMUNITY)
Admission: EM | Disposition: A | Discharge status: Home / Self Care | Payer: Self-pay | Source: Home / Self Care | Attending: Internal Medicine

## 2017-10-04 ENCOUNTER — Inpatient Hospital Stay (HOSPITAL_COMMUNITY): Payer: Self-pay | Admitting: Certified Registered"

## 2017-10-04 HISTORY — PX: INCISION AND DRAINAGE HIP: SHX1801

## 2017-10-04 LAB — CBC
HEMATOCRIT: 28.2 % — AB (ref 39.0–52.0)
Hemoglobin: 9.2 g/dL — ABNORMAL LOW (ref 13.0–17.0)
MCH: 30.9 pg (ref 26.0–34.0)
MCHC: 32.6 g/dL (ref 30.0–36.0)
MCV: 94.6 fL (ref 78.0–100.0)
PLATELETS: 272 10*3/uL (ref 150–400)
RBC: 2.98 MIL/uL — AB (ref 4.22–5.81)
RDW: 13 % (ref 11.5–15.5)
WBC: 9.1 10*3/uL (ref 4.0–10.5)

## 2017-10-04 SURGERY — IRRIGATION AND DEBRIDEMENT HIP
Anesthesia: General | Site: Hip | Laterality: Bilateral

## 2017-10-04 MED ORDER — FENTANYL CITRATE (PF) 250 MCG/5ML IJ SOLN
INTRAMUSCULAR | Status: AC
  Filled 2017-10-04: qty 5

## 2017-10-04 MED ORDER — EPHEDRINE 5 MG/ML INJ
INTRAVENOUS | Status: AC
  Filled 2017-10-04: qty 10

## 2017-10-04 MED ORDER — HYDROMORPHONE HCL 1 MG/ML IJ SOLN
1.0000 mg | INTRAMUSCULAR | Status: DC | PRN
  Administered 2017-10-04 – 2017-10-05 (×5): 2 mg via INTRAVENOUS
  Filled 2017-10-04 (×5): qty 2

## 2017-10-04 MED ORDER — HYDROMORPHONE HCL 1 MG/ML IJ SOLN
INTRAMUSCULAR | Status: AC
  Filled 2017-10-04: qty 0.5

## 2017-10-04 MED ORDER — PROPOFOL 10 MG/ML IV BOLUS
INTRAVENOUS | Status: DC | PRN
  Administered 2017-10-04: 170 mg via INTRAVENOUS

## 2017-10-04 MED ORDER — MIDAZOLAM HCL 2 MG/2ML IJ SOLN
INTRAMUSCULAR | Status: AC
Start: 2017-10-04 — End: ?
  Filled 2017-10-04: qty 2

## 2017-10-04 MED ORDER — ONDANSETRON HCL 4 MG/2ML IJ SOLN
INTRAMUSCULAR | Status: DC | PRN
  Administered 2017-10-04: 4 mg via INTRAVENOUS

## 2017-10-04 MED ORDER — HYDROMORPHONE HCL 1 MG/ML IJ SOLN: 2.0000 mg | INTRAMUSCULAR | Status: DC | PRN

## 2017-10-04 MED ORDER — MIDAZOLAM HCL 5 MG/5ML IJ SOLN
INTRAMUSCULAR | Status: DC | PRN
  Administered 2017-10-04: 2 mg via INTRAVENOUS

## 2017-10-04 MED ORDER — ONDANSETRON HCL 4 MG/2ML IJ SOLN
INTRAMUSCULAR | Status: AC
  Filled 2017-10-04: qty 2

## 2017-10-04 MED ORDER — FENTANYL CITRATE (PF) 100 MCG/2ML IJ SOLN
INTRAMUSCULAR | Status: AC
  Filled 2017-10-04: qty 2

## 2017-10-04 MED ORDER — CEFAZOLIN SODIUM 1 G IJ SOLR
INTRAMUSCULAR | Status: AC
  Filled 2017-10-04: qty 20

## 2017-10-04 MED ORDER — HYDROMORPHONE HCL 1 MG/ML IJ SOLN
INTRAMUSCULAR | Status: DC | PRN
  Administered 2017-10-04: 0.5 mg via INTRAVENOUS

## 2017-10-04 MED ORDER — HYDROMORPHONE HCL 2 MG PO TABS: 1.0000 mg | ORAL_TABLET | ORAL | Status: DC | PRN

## 2017-10-04 MED ORDER — PROPOFOL 10 MG/ML IV BOLUS
INTRAVENOUS | Status: AC
  Filled 2017-10-04: qty 20

## 2017-10-04 MED ORDER — LACTATED RINGERS IV SOLN
INTRAVENOUS | Status: DC
  Administered 2017-10-04: 13:00:00 via INTRAVENOUS

## 2017-10-04 MED ORDER — FENTANYL CITRATE (PF) 100 MCG/2ML IJ SOLN
25.0000 ug | INTRAMUSCULAR | Status: DC | PRN
  Administered 2017-10-04 (×2): 50 ug via INTRAVENOUS

## 2017-10-04 MED ORDER — FENTANYL CITRATE (PF) 250 MCG/5ML IJ SOLN
INTRAMUSCULAR | Status: DC | PRN
  Administered 2017-10-04 (×5): 50 ug via INTRAVENOUS

## 2017-10-04 SURGICAL SUPPLY — 42 items
CLOSURE STERI-STRIP 1/2X4 (GAUZE/BANDAGES/DRESSINGS) ×2
CLSR STERI-STRIP ANTIMIC 1/2X4 (GAUZE/BANDAGES/DRESSINGS) ×4 IMPLANT
COVER SURGICAL LIGHT HANDLE (MISCELLANEOUS) ×3 IMPLANT
DRAPE IMP U-DRAPE 54X76 (DRAPES) ×3 IMPLANT
DRAPE ORTHO SPLIT 77X108 STRL (DRAPES) ×4
DRAPE SURG ORHT 6 SPLT 77X108 (DRAPES) ×2 IMPLANT
DRAPE U-SHAPE 47X51 STRL (DRAPES) ×3 IMPLANT
DRSG AQUACEL AG ADV 3.5X10 (GAUZE/BANDAGES/DRESSINGS) ×3 IMPLANT
DRSG MEPILEX BORDER 4X8 (GAUZE/BANDAGES/DRESSINGS) ×3 IMPLANT
DURAPREP 26ML APPLICATOR (WOUND CARE) ×3 IMPLANT
ELECT CAUTERY BLADE 6.4 (BLADE) ×3 IMPLANT
ELECT REM PT RETURN 9FT ADLT (ELECTROSURGICAL) ×3
ELECTRODE REM PT RTRN 9FT ADLT (ELECTROSURGICAL) ×1 IMPLANT
EVACUATOR 1/8 PVC DRAIN (DRAIN) ×6 IMPLANT
FACESHIELD WRAPAROUND (MASK) ×3 IMPLANT
GLOVE BIO SURGEON STRL SZ7.5 (GLOVE) ×6 IMPLANT
GLOVE BIOGEL PI IND STRL 8 (GLOVE) ×2 IMPLANT
GLOVE BIOGEL PI INDICATOR 8 (GLOVE) ×4
GOWN STRL REUS W/ TWL LRG LVL3 (GOWN DISPOSABLE) ×2 IMPLANT
GOWN STRL REUS W/ TWL XL LVL3 (GOWN DISPOSABLE) ×1 IMPLANT
GOWN STRL REUS W/TWL LRG LVL3 (GOWN DISPOSABLE) ×4
GOWN STRL REUS W/TWL XL LVL3 (GOWN DISPOSABLE) ×2
HANDPIECE INTERPULSE COAX TIP (DISPOSABLE) ×2
KIT BASIN OR (CUSTOM PROCEDURE TRAY) ×3 IMPLANT
KIT ROOM TURNOVER OR (KITS) ×3 IMPLANT
MANIFOLD NEPTUNE II (INSTRUMENTS) ×3 IMPLANT
NS IRRIG 1000ML POUR BTL (IV SOLUTION) ×3 IMPLANT
PACK TOTAL JOINT (CUSTOM PROCEDURE TRAY) ×3 IMPLANT
PACK UNIVERSAL I (CUSTOM PROCEDURE TRAY) ×3 IMPLANT
PAD ARMBOARD 7.5X6 YLW CONV (MISCELLANEOUS) ×6 IMPLANT
SET HNDPC FAN SPRY TIP SCT (DISPOSABLE) ×1 IMPLANT
SUT ETHILON 3 0 FSL (SUTURE) ×6 IMPLANT
SUT FIBERWIRE #2 38 REV NDL BL (SUTURE)
SUT MNCRL AB 4-0 PS2 18 (SUTURE) ×3 IMPLANT
SUT MON AB 2-0 CT1 36 (SUTURE) ×6 IMPLANT
SUT VIC AB 0 CT1 27 (SUTURE) ×2
SUT VIC AB 0 CT1 27XBRD ANBCTR (SUTURE) ×1 IMPLANT
SUTURE FIBERWR#2 38 REV NDL BL (SUTURE) IMPLANT
TOWEL OR 17X24 6PK STRL BLUE (TOWEL DISPOSABLE) ×3 IMPLANT
TOWEL OR 17X26 10 PK STRL BLUE (TOWEL DISPOSABLE) ×3 IMPLANT
TOWEL OR NON WOVEN STRL DISP B (DISPOSABLE) ×3 IMPLANT
WATER STERILE IRR 1000ML POUR (IV SOLUTION) ×6 IMPLANT

## 2017-10-04 NOTE — Progress Notes (Signed)
PROGRESS NOTE  Gary Hicks NOB:[id number] DOB: May 17, 1986 DOA: 09/11/2017 PCP: Patient, [name] Per  HPI/Recap of past 67 hours: 32 year old male, active IVDA, who presents with generalized pain. Patient was incarcerated and released 3-4 weeks ago. Patient admits using IV methamphetamine as well as heroin in the past 24 hours prior to admission. Reports global polyarthralgias including his bilateral feet bilateral knees bilateral hips and shoulders. Also reports fevers and chills generalized myalgias. Pt admitted for further management.   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 Pt was also diagnosed with DVT Right gastrocnemius vein . He was started on Lovenox 2/18. He was also diagnosed with left knee septic infection. He -underwent bursectomy and I and D by Dr Percell Miller 2-25. He reported worsening left hip pain 3-02, MRI showed, B/L hip Septic arthritis and 13 mm fluid collection in the posterior aspect of the left obturator internus muscle most concerning for a small abscess. IR was consulted for arthrocentesis, patient couldn't tolerate procedure. Dr Percell Miller recommend I and D of bilateral hip, plan for OR 3-05   Subjective;  He is worry about pain management post sx. He is still complaining of hip pain.    Assessment/Plan: Principal Problem:   Bacteremia due to Staphylococcus aureus Active Problems:   Heroin abuse (HCC)   Polysubstance abuse (HCC)   Severe sepsis (HCC)   Cigarette smoker   Hypokalemia   DIC (disseminated intravascular coagulation) (HCC)   Staphylococcal arthritis of right shoulder (HCC)   Septic infrapatellar bursitis of left knee   Staphylococcal arthritis of left hip (HCC)   Staphylococcal arthritis of right hip (HCC)   Endocarditis of tricuspid valve   Calf abscess   Pain   Pyomyositis   Saphenous vein thrombophlebitis, right   Anxiety  Tricuspid valve endocarditis with MSSA bacteremia Blood culture 2/11 grew  MSSA, repeat on 2/15 NGTD ECHO showed: Tricuspid valve: There was a large, 1.1 cm (W) x 1.8 cm (L), multilobulated, highly mobile vegetation on the right ventricular aspect of the anterior leaflet. There was moderate-severe regurgitation Continue IV Ancef for 6 weeks from 2/23-->11/05/16 as per ID ID following Pt will most likely remain in the hospital the entire time as pt in on suboxone and cant go to SNF  Afebrile.   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  -S/P I&D of L Knee prepatellar bursitis on 09/23/17 . Cultures from I&D, NGTD -MRI left knee: 4.8 x 0.8 x 4 cm fluid collection in subcutaneous fat anterior to patellar tendon with severe surrounding soft tissue edema concerning for septic bursitis -MRI initial  hip: No drainable fluid collection or abscess. -MRI Right shoulder: Mild supraspinatus and infraspinatus muscle edema suggestive of Myositis -MRI brain negative for abscess -MRI C spine: Motion degraded exam. C2-3 through C7-T1 no bulge, stenosis or foraminal narrowing. Mild C3-4 through C5-6 facet arthropathy  -MRI R femur: currently pending as pt refused -Doppler left arm: Negative for DVT -underwent bursectomy and I and D by Dr Percell Miller 2-25.  Ortho saw patient post op. Patient will need to follow up with ortho in 10 days.  X ray; degenerative changes of hips  -He complain of worsening hips pain 3-02, MRI done showed Septic arthritis and 13 mm fluid collection in the posterior aspect of the left obturator internus muscle most concerning for a small abscess. Ortho consulted 3-03. Recommend hips aspiration and abscess aspiration.  Hold Lovenox in anticipation for procedure.  To OR  for Bilateral I and D hip.  -Discussed with Dr Lalla Brothers, we could do Dilaudid 2 Mg IV Q 4 hours post Sx for 24 to 48 hours. Hold Klonopin while patient is on Dilaudid.   Lightheaded, SOB;  Check oxygen sat and orthostatic vitals.  Hb stable.  vitamin D level low  at 16. Will start supplement.  IV fluids.   Malnutrition; report poor appetite. Ensure. Nutrition consulted.   IVDA  Dr Damita Dunnings managing Suboxone BID  No high dose IV benzo unless required for seizure Discussed with Dr Lalla Brothers, we could do Dilaudid 2 Mg IV Q 4 hours post Sx for 24 to 48 hours. Hold Klonopin while patient is on Dilaudid.   GAD Psych consulted: start zoloft 50 mg daily May continue Klonopin prn for 2 weeks and wean off, not advised for long term.  Please refer to note dated 2/22 by psych attending for more info  Seizures Likely related to drug use Seizure precautions PRN Ativan MRI negative  Iron def anemia Continue PO iron Hb stable.   DVT right Gastrocnemius vein Also superficial saphenous thrombophlebitis.  Holding lovenox for procedure today. Needs to discussed with ortho when to resume anticoagulation.  Hb stable. Platelet normal;   Thrombocytopenia Resolved  Hyponatremia Fluctuates.  Daily BMP   Hepatitis c antibody positive ID on board  Hypokalemia; resolved.   Vitamin D deficiency; started on supplementation.   Code Status: Full  Family Communication: None at bedside  Disposition Plan: TBD   Consultants:  ID  Orthopedics  Procedures:  I&D of L knee 09/23/17  Antimicrobials:  IV Ancef  DVT prophylaxis:  Therapeutic lovenox   Objective: Vitals:   10/03/17 1556 10/03/17 1726 10/03/17 1956 10/04/17 0624  BP: 140/80 99/69 95/65  97/63  Pulse: (!) 118 (!) 113 (!) 112 (!) 109  Resp: 20 18  18   Temp:  98.9 F (37.2 C) 99.4 F (37.4 C) (!) 97.5 F (36.4 C)  TempSrc:  Oral Oral Oral  SpO2: 100% 95% 96% 96%  Weight:      Height:        Intake/Output Summary (Last 24 hours) at 10/04/2017 0825 Last data filed at 10/04/2017 0600 Gross per 24 hour  Intake 3500 ml  Output 2700 ml  Net 800 ml   Filed Weights   09/21/17 2056 09/22/17 2110 09/23/17 1328  Weight: 73.1 kg (161 lb 1.1 oz) 74 kg (163 lb 2.3 oz) 74 kg  (163 lb 2.3 oz)    Exam:   General: NAD, Chronic ill appearing.   Cardiovascular: S 1, S 2 RRR  Respiratory: CTA  Abdomen; BS present, soft, nt  Musculoskeletal; left knee with dressing.   Skin: No rash   Psychiatry: Flat affect   Data Reviewed: CBC: Recent Labs  Lab 09/28/17 0402 09/29/17 0413 09/30/17 0406 10/02/17 0439 10/03/17 0427 10/04/17 0306  WBC 6.4 8.1 9.5 9.1 8.9 9.1  NEUTROABS 3.7  --   --   --   --   --   HGB 9.5* 10.0* 9.5* 10.0* 9.2* 9.2*  HCT 29.1* 30.5* 29.2* 30.1* 28.3* 28.2*  MCV 95.7 95.6 95.4 95.0 95.3 94.6  PLT 286 301 297 327 268 825   Basic Metabolic Panel: Recent Labs  Lab 09/28/17 0402 09/29/17 0413 09/30/17 0406 10/03/17 0427  NA 133* 133* 132* 133*  K 3.9 3.4* 4.1 4.0  CL 96* 95* 95* 96*  CO2 29 28 27 29   GLUCOSE 113* 117* 110* 113*  BUN 7 7 12  7  CREATININE 0.54* 0.63 0.62 0.61  CALCIUM 8.7* 8.7* 8.6* 8.6*   GFR: Estimated Creatinine Clearance: 133.8 mL/min (by C-G formula based on SCr of 0.61 mg/dL). Liver Function Tests: No results for input(s): AST, ALT, ALKPHOS, BILITOT, PROT, ALBUMIN in the last 168 hours. No results for input(s): LIPASE, AMYLASE in the last 168 hours. No results for input(s): AMMONIA in the last 168 hours. Coagulation Profile: Recent Labs  Lab 10/03/17 0905  INR 1.27   Cardiac Enzymes: No results for input(s): CKTOTAL, CKMB, CKMBINDEX, TROPONINI in the last 168 hours. BNP (last 3 results) No results for input(s): PROBNP in the last 8760 hours. HbA1C: No results for input(s): HGBA1C in the last 72 hours. CBG: No results for input(s): GLUCAP in the last 168 hours. Lipid Profile: No results for input(s): CHOL, HDL, LDLCALC, TRIG, CHOLHDL, LDLDIRECT in the last 72 hours. Thyroid Function Tests: No results for input(s): TSH, T4TOTAL, FREET4, T3FREE, THYROIDAB in the last 72 hours. Anemia Panel: No results for input(s): VITAMINB12, FOLATE, FERRITIN, TIBC, IRON, RETICCTPCT in the last 72  hours. Urine analysis:    Component Value Date/Time   COLORURINE YELLOW 09/10/2017 1657   APPEARANCEUR CLEAR 09/10/2017 1657   LABSPEC 1.019 09/10/2017 1657   PHURINE 6.0 09/10/2017 1657   GLUCOSEU NEGATIVE 09/10/2017 1657   HGBUR NEGATIVE 09/10/2017 1657   BILIRUBINUR NEGATIVE 09/10/2017 1657   KETONESUR NEGATIVE 09/10/2017 1657   PROTEINUR NEGATIVE 09/10/2017 1657   UROBILINOGEN 0.2 05/16/2012 0847   NITRITE NEGATIVE 09/10/2017 1657   LEUKOCYTESUR NEGATIVE 09/10/2017 1657   Sepsis Labs: @LABRCNTIP (procalcitonin:4,lacticidven:4)  ) No results found for this or any previous visit (from the past 240 hour(s)).    Studies: No results found.  Scheduled Meds: . buprenorphine-naloxone  1 tablet Sublingual BID  . Chlorhexidine Gluconate Cloth  6 each Topical Daily  . famotidine  20 mg Oral Daily  . feeding supplement (ENSURE ENLIVE)  237 mL Oral TID BM  . ferrous sulfate  325 mg Oral BID WC  . multivitamin with minerals  1 tablet Oral Daily  . mupirocin ointment  1 application Nasal BID  . nicotine  14 mg Transdermal Daily  . potassium chloride  40 mEq Oral BID  . sertraline  50 mg Oral Daily  . Vitamin D (Ergocalciferol)  50,000 Units Oral Q Mon    Continuous Infusions: . sodium chloride 100 mL/hr at 10/04/17 0721  .  ceFAZolin (ANCEF) IV Stopped (10/04/17 4259)  . lactated ringers 10 mL/hr at 09/23/17 1329     LOS: 23 days     Elmarie Shiley, MD [phone] [facility]   If 7PM-7AM, please contact night-coverage [email or web address] Password [email or web address] 10/04/2017, 8:25 AM

## 2017-10-04 NOTE — Anesthesia Procedure Notes (Signed)
Procedure Name: LMA Insertion Date/Time: 10/04/2017 1:47 PM Performed by: Freddie Breech, CRNA Pre-anesthesia Checklist: Patient identified, Emergency Drugs available, Suction available and Patient being monitored Patient Re-evaluated:Patient Re-evaluated prior to induction Oxygen Delivery Method: Circle System Utilized Preoxygenation: Pre-oxygenation with 100% oxygen Induction Type: IV induction Ventilation: Mask ventilation without difficulty LMA: LMA inserted LMA Size: 4.0 Number of attempts: 1 Airway Equipment and Method: Bite block Placement Confirmation: positive ETCO2 Tube secured with: Tape Dental Injury: Teeth and Oropharynx as per pre-operative assessment

## 2017-10-04 NOTE — Transfer of Care (Signed)
Immediate Anesthesia Transfer of Care Note  Patient: Gary Hicks  Procedure(s) Performed: IRRIGATION AND DEBRIDEMENT HIP (Bilateral Hip)  Patient Location: PACU  Anesthesia Type:General  Level of Consciousness: drowsy and patient cooperative  Airway & Oxygen Therapy: Patient Spontanous Breathing and Patient connected to face mask oxygen  Post-op Assessment: Report given to RN and Post -op Vital signs reviewed and stable  Post vital signs: Reviewed and stable  Last Vitals:  Vitals:   10/04/17 0624 10/04/17 0900  BP: 97/63 100/68  Pulse: (!) 109 (!) 110  Resp: 18 18  Temp: (!) 36.4 C 36.7 C  SpO2: 96% 98%    Last Pain:  Vitals:   10/04/17 0900  TempSrc: Oral  PainSc:       Patients Stated Pain Goal: 0 (84/16/60 6301)  Complications: No apparent anesthesia complications

## 2017-10-04 NOTE — Progress Notes (Signed)
Pt sleeping but when awakened for assessment, states he is a 10/10 then returns to sleep

## 2017-10-04 NOTE — Interval H&P Note (Signed)
History and Physical Interval Note:  10/04/2017 10:19 AM  Gary Hicks  has presented today for surgery, with the diagnosis of INFECTED HIP  The various methods of treatment have been discussed with the patient and family. After consideration of risks, benefits and other options for treatment, the patient has consented to  Procedure(s): IRRIGATION AND DEBRIDEMENT HIP (Bilateral) as a surgical intervention .  The patient's history has been reviewed, patient examined, no change in status, stable for surgery.  I have reviewed the patient's chart and labs.  Questions were answered to the patient's satisfaction.     Bexleigh Theriault D

## 2017-10-04 NOTE — Anesthesia Postprocedure Evaluation (Signed)
Anesthesia Post Note  Patient: Gary Hicks  Procedure(s) Performed: IRRIGATION AND DEBRIDEMENT HIP (Bilateral Hip)     Patient location during evaluation: PACU Anesthesia Type: General Level of consciousness: awake Pain management: pain level controlled Vital Signs Assessment: post-procedure vital signs reviewed and stable Respiratory status: spontaneous breathing Cardiovascular status: stable Anesthetic complications: no    Last Vitals:  Vitals:   10/04/17 1549 10/04/17 1629  BP: 108/78 110/75  Pulse: (!) 105 (!) 112  Resp: 15 18  Temp: (!) 36.1 C 37 C  SpO2: 95% 96%    Last Pain:  Vitals:   10/04/17 1629  TempSrc: Oral  PainSc:                  Atilla Zollner

## 2017-10-04 NOTE — Progress Notes (Signed)
PT Cancellation Note  Patient Details Name: Gary Hicks MRN: [id number] DOB: [birth year]   Cancelled Treatment:    Reason Eval/Treat Not Completed: Patient at procedure or test/unavailable. Will check back as time allows.    [place] 10/04/2017, 2:07 PM

## 2017-10-04 NOTE — Op Note (Signed)
09/11/2017 - 10/04/2017  1:42 PM  PATIENT:  Gary Hicks    PRE-OPERATIVE DIAGNOSIS:  INFECTED HIP  POST-OPERATIVE DIAGNOSIS:  Same  PROCEDURE:  IRRIGATION AND DEBRIDEMENT HIP  SURGEON:  Vincenta Steffey, Ernesta Amble, MD  ASSISTANT: Roxan Hockey, PA-C, he was present and scrubbed throughout the case, critical for completion in a timely fashion, and for retraction, instrumentation, and closure.   ANESTHESIA:   gen  PREOPERATIVE INDICATIONS:  [name] is a  32 y.o. male with a diagnosis of INFECTED HIP who failed conservative measures and elected for surgical management.    The risks benefits and alternatives were discussed with the patient preoperatively including but not limited to the risks of infection, bleeding, nerve injury, cardiopulmonary complications, the need for revision surgery, among others, and the patient was willing to proceed.  OPERATIVE IMPLANTS: none  OPERATIVE FINDINGS: purulent fluid  BLOOD LOSS: min  COMPLICATIONS: none  TOURNIQUET TIME: none  OPERATIVE PROCEDURE:  Patient was identified in the preoperative holding area and site was marked by me He was transported to the operating theater and placed on the table in supine position taking care to pad all bony prominences. After a preincinduction time out anesthesia was induced. The bilateral lower extremity was prepped and draped in normal sterile fashion and a pre-incision timeout was performed. He received ancef for preoperative antibiotics.   Started on his left hip where I performed an anterior incision to his left hip.  Identified the fascia over his tensor muscle and incised this I then retracted his tensor muscle posterior laterally.  I identified and protected his circumflex vessels and then made a capsulotomy into his anterior hip capsule.  I performed a thorough irrigation here I debrided all devitalized tissue muscle fascia. I debrided synovium from this hip  After thorough irrigation I placed a Hemovac drain  I closed his fascia with a Monocryl stitch and closed the skin with a nylon stitch.  I used new instruments for the contralateral side.  Next I turned my attention to his right hip where I again performed an anterior incision identified his tensor muscle incised the fascia retracted it and then incised the deep fascia.  I protected his circumflex vessels and then made a anterior arthrotomy into his right hip.  I again debrided devitalized tissue muscle and fascia. I debrided synovium from this hip as well.  I then performed a thorough irrigation placed a Hemovac drain and closed his side in a similar fashion.  POST OPERATIVE PLAN: dvt px per primary team, WBAT

## 2017-10-04 NOTE — Anesthesia Preprocedure Evaluation (Signed)
Anesthesia Evaluation  Patient identified by MRN, date of birth, ID band Patient awake  General Assessment Comment:History noted. CG  Reviewed: Allergy & Precautions, NPO status , Patient's Chart, lab work & pertinent test results  Airway Mallampati: II  TM Distance: >3 FB     Dental   Pulmonary neg pulmonary ROS, Current Smoker,    breath sounds clear to auscultation       Cardiovascular  Rhythm:Regular Rate:Normal     Neuro/Psych    GI/Hepatic negative GI ROS, Neg liver ROS,   Endo/Other    Renal/GU Renal disease     Musculoskeletal   Abdominal   Peds  Hematology  (+) anemia ,   Anesthesia Other Findings   Reproductive/Obstetrics                             Anesthesia Physical Anesthesia Plan  ASA: III  Anesthesia Plan: General   Post-op Pain Management:    Induction: Intravenous  PONV Risk Score and Plan: Treatment may vary due to age or medical condition, Ondansetron and Midazolam  Airway Management Planned: LMA  Additional Equipment:   Intra-op Plan:   Post-operative Plan: Extubation in OR  Informed Consent: I have reviewed the patients History and Physical, chart, labs and discussed the procedure including the risks, benefits and alternatives for the proposed anesthesia with the patient or authorized representative who has indicated his/her understanding and acceptance.   Dental advisory given  Plan Discussed with: Anesthesiologist and CRNA  Anesthesia Plan Comments:         Anesthesia Quick Evaluation

## 2017-10-04 NOTE — Progress Notes (Signed)
Patient refused CHG bath, patient educated on the importance of the CHG bath and infection. Patient also educated on the discontinuation of the Klonopin with suboxone and the use of Dilaudid after the OR.

## 2017-10-05 ENCOUNTER — Encounter (HOSPITAL_COMMUNITY): Payer: Self-pay | Admitting: Orthopedic Surgery

## 2017-10-05 MED ORDER — ENOXAPARIN SODIUM 80 MG/0.8ML ~~LOC~~ SOLN: 70.0000 mg | Freq: Two times a day (BID) | SUBCUTANEOUS | Status: DC

## 2017-10-05 MED ORDER — CLONAZEPAM 0.5 MG PO TABS
0.5000 mg | ORAL_TABLET | Freq: Two times a day (BID) | ORAL | Status: DC
  Administered 2017-10-05 – 2017-10-07 (×4): 0.5 mg via ORAL
  Filled 2017-10-05 (×4): qty 1

## 2017-10-05 NOTE — Progress Notes (Signed)
PT Cancellation Note  Patient Details Name: [name] MRN: [id number] DOB: 05/15/1986   Cancelled Treatment:    Reason Eval/Treat Not Completed: Patient declined, no reason specified. Pt refusing to mobilize with PT at this time. PT will continue to f/u with pt as available.    Sierra Brooks 10/05/2017, 10:48 AM

## 2017-10-05 NOTE — Progress Notes (Signed)
    I re-evaluated Gary Hicks today for Suboxone therapy of opioid use disorder. I started him on 16mg  daily on 2/14 and he has done well, controlled withdrawal and cravings. He had bilateral hip surgery yesterday for septic arthritis, we covered acute pain by adding Dilaudid IV 2mg  q8hours which has been helpful. He still reports pain, but says it is tolerable. He shows no signs of respiratory suppression or sedation today. He does want to restart low dose clonazepam soon, which I think is fine though I told him it would be best to wait until he no longer requires dilaudid for acute pain. He still wants to do outpatient suboxone therapy, has applied for medicaid which is great. He has several more weeks of IV antibiotics, and I wonder if this new septic arthritis will restart the clock on those. Will follow up with him as needed.   Lalla Brothers, MD  Pager: [phone]

## 2017-10-05 NOTE — Progress Notes (Signed)
ANTICOAGULATION CONSULT NOTE - Initial Consult  Pharmacy Consult for Lovenox Indication: DVT  Allergies  Allergen Reactions  . Prednisone Hives  . Amoxicillin Hives    Patient Measurements: Height: 5\' 9"  (175.3 cm) Weight: 163 lb 2.3 oz (74 kg) IBW/kg (Calculated) : 70.7   Vital Signs: Temp: 98.3 F (36.8 C) (03/06 0912) Temp Source: Oral (03/06 0912) BP: 102/64 (03/06 0912) Pulse Rate: 120 (03/06 0912)  Labs: Recent Labs    10/03/17 0427 10/03/17 0905 10/04/17 0306  HGB 9.2*  --  9.2*  HCT 28.3*  --  28.2*  PLT 268  --  272  LABPROT  --  15.8*  --   INR  --  1.27  --   CREATININE 0.61  --   --     Estimated Creatinine Clearance: 133.8 mL/min (by C-G formula based on SCr of 0.61 mg/dL).  Assessment: 12 YOM s/p I&D and bursectomy of L knee. Ortho has cleared for pharmacologic VTE treatment as patient has DVT in R gastrocnemius vein. He was previously on treatment dose Lovenox, but it was held for surgery. SCr 0.61, CrCl >185mL/min. Hgb 9.2, plts 272- no overt bleeding noted.  Goal of Therapy:    Monitor platelets by anticoagulation protocol: Yes   Plan:  Restart Lovenox 70mg  subQ q12h CBC q72h  Follow s/s bleeding  Rockie Vawter D. Luvena Wentling, PharmD, [place] Clinical Pharmacist [phone] 10/05/2017 4:57 PM

## 2017-10-05 NOTE — Progress Notes (Signed)
PROGRESS NOTE  Les Longmore Baggerly JOA:[id number] DOB: [birth year] DOA: 09/11/2017 PCP: Patient, [name] Per  HPI/Recap of past 54 hours: 32 year old male, active IVDA, who presents with generalized pain. Patient was incarcerated and released 3-4 weeks ago. Patient admits using IV methamphetamine as well as heroin in the past 24 hours prior to admission. Reports global polyarthralgias including his bilateral feet, bilateral knees, bilateral hips and shoulders. Also reports fevers and chills generalized myalgias. Pt admitted for further management. 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 Pt was also diagnosed with DVT Right gastrocnemius vein . He was started on Lovenox 2/18. He was also diagnosed with left knee septic infection. He underwent bursectomy and I and D by Dr Percell Miller 2-25. He reported worsening left hip pain 3-02, MRI showed, B/L hip Septic arthritis and 13 mm fluid collection in the posterior aspect of the left obturator internus muscle most concerning for a small abscess. IR was consulted for arthrocentesis, patient couldn't tolerate procedure. Dr Percell Miller performed I and D of bilateral hip on 3/5.  Today, pt reported pain is controlled with IV dilaudid, and understands its just for 24-48hrs. Pt denies any chest pain, SOB, N/V, fever/chills, abdominal pain. Pt continues to refuse PT.   Assessment/Plan: Principal Problem:   Bacteremia due to Staphylococcus aureus Active Problems:   Heroin abuse (HCC)   Polysubstance abuse (HCC)   Severe sepsis (HCC)   Cigarette smoker   Hypokalemia   DIC (disseminated intravascular coagulation) (HCC)   Staphylococcal arthritis of right shoulder (HCC)   Septic infrapatellar bursitis of left knee   Staphylococcal arthritis of left hip (HCC)   Staphylococcal arthritis of right hip (HCC)   Endocarditis of tricuspid valve   Calf abscess   Pain   Pyomyositis   Saphenous vein thrombophlebitis, right    Anxiety  Tricuspid valve endocarditis with MSSA bacteremia Afebrile, with no leukocytosis Blood culture 2/11 grew MSSA, repeat on 2/15 NGTD ECHO showed: Tricuspid valve: There was a large, 1.1 cm (W) x 1.8 cm (L), multilobulated, highly mobile vegetation on the right ventricular aspect of the anterior leaflet. 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  -S/P I&D and bursectomy of L Knee prepatellar bursitis on 09/23/17 -S/P I&D of septic arthritis bilateral hip on 10/04/17 - Cultures from I&D, NGTD -MRI left knee: 4.8 x 0.8 x 4 cm fluid collection in subcutaneous fat anterior to patellar tendon with severe surrounding soft tissue edema concerning for septic bursitis -MRI Right shoulder: Mild supraspinatus and infraspinatus muscle edema suggestive of Myositis -MRI brain negative for abscess -MRI C spine: Motion degraded exam. C2-3 through C7-T1 no bulge, stenosis or foraminal narrowing. Mild C3-4 through C5-6 facet arthropathy  -Doppler left arm: Negative for DVT -MRI hip showed septic arthritis and 13 mm fluid collection in the posterior aspect of the left obturator internus muscle most concerning for a small abscess -Ortho on board  Malnutrition; report poor appetite. Ensure. Nutrition consulted.   IVDA  Dr Damita Dunnings managing Suboxone BID  No high dose IV benzo unless required for seizure Discussed with Dr Lalla Brothers, we could do Dilaudid 2 Mg IV Q 4 hours post Sx for 24 to 48 hours. Hold Klonopin while patient is on  Dilaudid.   [facility] Psych consulted: start zoloft 50 mg daily May continue Klonopin prn for 2 weeks and wean off, not advised for long term.  Please refer to note dated 2/22 by psych attending for more info  Seizures Likely  related to drug use Seizure precautions PRN Ativan MRI negative  Iron def anemia Continue POiron Hb stable  DVT right Gastrocnemius vein Also superficial saphenous thrombophlebitis.  Restart lovenox, ortho ok for AC  Thrombocytopenia Resolved  Hyponatremia Fluctuates.  Daily BMP   Hepatitis c antibody positive ID on board  Hypokalemia; resolved.   Vitamin D deficiency; started on supplementation.      Code Status: Full  Family Communication: None at bedside   Disposition Plan: Until IV AB is completed   Consultants:  ID  Ortho  Procedures:  I&D of L knee on 09/23/17  I &D of bilateral hip on 10/04/17  Antimicrobials:  IV Ancef   DVT prophylaxis:  Therapeutic lovenox   Objective: Vitals:   10/04/17 1629 10/04/17 2100 10/05/17 0426 10/05/17 0912  BP: 110/75 99/68 (!) 110/58 102/64  Pulse: (!) 112 (!) 110 94 (!) 120  Resp: 18 19 18 18   Temp: 98.6 F (37 C) 98.5 F (36.9 C) 98.2 F (36.8 C) 98.3 F (36.8 C)  TempSrc: Oral Oral Oral Oral  SpO2: 96% 96% 98% 94%  Weight:      Height:        Intake/Output Summary (Last 24 hours) at 10/05/2017 1606 Last data filed at 10/05/2017 1500 Gross per 24 hour  Intake 3540 ml  Output 775 ml  Net 2765 ml   Filed Weights   09/21/17 2056 09/22/17 2110 09/23/17 1328  Weight: 73.1 kg (161 lb 1.1 oz) 74 kg (163 lb 2.3 oz) 74 kg (163 lb 2.3 oz)    Exam:   General:  NAD, ill apprearing  Cardiovascular: S1, S2 RRR  Respiratory: CTA  Abdomen: Soft, NT, ND, BS+   Musculoskeletal: L knee with dressing   Skin: Normal, no rash  Psychiatry: Flat affect   Data Reviewed: CBC: Recent Labs  Lab 09/29/17 0413 09/30/17 0406 10/02/17 0439 10/03/17 0427 10/04/17 0306  WBC 8.1 9.5 9.1 8.9 9.1  HGB 10.0* 9.5* 10.0* 9.2* 9.2*  HCT 30.5* 29.2* 30.1* 28.3* 28.2*  MCV 95.6 95.4 95.0 95.3 94.6  PLT 301 297 327 268 409   Basic Metabolic Panel: Recent Labs  Lab 09/29/17 0413 09/30/17 0406  10/03/17 0427  NA 133* 132* 133*  K 3.4* 4.1 4.0  CL 95* 95* 96*  CO2 28 27 29   GLUCOSE 117* 110* 113*  BUN 7 12 7   CREATININE 0.63 0.62 0.61  CALCIUM 8.7* 8.6* 8.6*   GFR: Estimated Creatinine Clearance: 133.8 mL/min (by C-G formula based on SCr of 0.61 mg/dL). Liver Function Tests: No results for input(s): AST, ALT, ALKPHOS, BILITOT, PROT, ALBUMIN in the last 168 hours. No results for input(s): LIPASE, AMYLASE in the last 168 hours. No results for input(s): AMMONIA in the last 168 hours. Coagulation Profile: Recent Labs  Lab 10/03/17 0905  INR 1.27   Cardiac Enzymes: No results for input(s): CKTOTAL, CKMB, CKMBINDEX, TROPONINI in the last 168 hours. BNP (last 3 results) No results for input(s): PROBNP in the last 8760 hours. HbA1C: No results for input(s): HGBA1C in the last 72 hours. CBG: No results for input(s): GLUCAP in the last 168 hours. Lipid Profile: No results for input(s): CHOL, HDL, LDLCALC, TRIG, CHOLHDL, LDLDIRECT in the last 72 hours. Thyroid Function Tests:  No results for input(s): TSH, T4TOTAL, FREET4, T3FREE, THYROIDAB in the last 72 hours. Anemia Panel: No results for input(s): VITAMINB12, FOLATE, FERRITIN, TIBC, IRON, RETICCTPCT in the last 72 hours. Urine analysis:    Component Value Date/Time   COLORURINE YELLOW 09/10/2017 1657   APPEARANCEUR CLEAR 09/10/2017 1657   LABSPEC 1.019 09/10/2017 1657   PHURINE 6.0 09/10/2017 1657   GLUCOSEU NEGATIVE 09/10/2017 1657   HGBUR NEGATIVE 09/10/2017 1657   BILIRUBINUR NEGATIVE 09/10/2017 1657   KETONESUR NEGATIVE 09/10/2017 1657   PROTEINUR NEGATIVE 09/10/2017 1657   UROBILINOGEN 0.2 05/16/2012 0847   NITRITE NEGATIVE 09/10/2017 1657   LEUKOCYTESUR NEGATIVE 09/10/2017 1657   Sepsis Labs: @LABRCNTIP (procalcitonin:4,lacticidven:4)  )No results found for this or any previous visit (from the past 240 hour(s)).    Studies: No results found.  Scheduled Meds: . buprenorphine-naloxone  1 tablet  Sublingual BID  . Chlorhexidine Gluconate Cloth  6 each Topical Daily  . famotidine  20 mg Oral Daily  . feeding supplement (ENSURE ENLIVE)  237 mL Oral TID BM  . ferrous sulfate  325 mg Oral BID WC  . multivitamin with minerals  1 tablet Oral Daily  . mupirocin ointment  1 application Nasal BID  . nicotine  14 mg Transdermal Daily  . potassium chloride  40 mEq Oral BID  . sertraline  50 mg Oral Daily  . Vitamin D (Ergocalciferol)  50,000 Units Oral Q Mon    Continuous Infusions: . sodium chloride 100 mL/hr at 10/05/17 1123  .  ceFAZolin (ANCEF) IV Stopped (10/05/17 1455)  . lactated ringers 10 mL/hr at 09/23/17 1329  . lactated ringers 10 mL/hr at 10/04/17 1237     LOS: 24 days     Alma Friendly, MD [facility]   If 7PM-7AM, please contact night-coverage [email or web address] Password [email or web address] 10/05/2017, 4:06 PM

## 2017-10-05 NOTE — Progress Notes (Signed)
Subjective: Patient reports pain as moderate and less painful now than before surgery.    Objective:   VITALS:   Vitals:   10/04/17 1549 10/04/17 1629 10/04/17 2100 10/05/17 0426  BP: 108/78 110/75 99/68 (!) 110/58  Pulse: (!) 105 (!) 112 (!) 110 94  Resp: 15 18 19 18   Temp: (!) 97 F (36.1 C) 98.6 F (37 C) 98.5 F (36.9 C) 98.2 F (36.8 C)  TempSrc:  Oral Oral Oral  SpO2: 95% 96% 96% 98%  Weight:      Height:       CBC Latest Ref Rng & Units 10/04/2017 10/03/2017 10/02/2017  WBC 4.0 - 10.5 K/uL 9.1 8.9 9.1  Hemoglobin 13.0 - 17.0 g/dL 9.2(L) 9.2(L) 10.0(L)  Hematocrit 39.0 - 52.0 % 28.2(L) 28.3(L) 30.1(L)  Platelets 150 - 400 K/uL 272 268 327   BMP Latest Ref Rng & Units 10/03/2017 09/30/2017 09/29/2017  Glucose 65 - 99 mg/dL 113(H) 110(H) 117(H)  BUN 6 - 20 mg/dL 7 12 7   Creatinine 0.61 - 1.24 mg/dL 0.61 0.62 0.63  Sodium 135 - 145 mmol/L 133(L) 132(L) 133(L)  Potassium 3.5 - 5.1 mmol/L 4.0 4.1 3.4(L)  Chloride 101 - 111 mmol/L 96(L) 95(L) 95(L)  CO2 22 - 32 mmol/L 29 27 28   Calcium 8.9 - 10.3 mg/dL 8.6(L) 8.6(L) 8.7(L)   Intake/Output      03/05 0701 - 03/06 0700 03/06 0701 - 03/07 0700   P.O. 0    I.V. (mL/kg) 3200 (43.2)    IV Piggyback     Total Intake(mL/kg) 3200 (43.2)    Urine (mL/kg/hr) 1350 (0.8)    Drains 25    Stool 0    Blood 75    Total Output 1450    Net +1750         Urine Occurrence 0 x    Stool Occurrence 0 x       Physical Exam: General: NAD. Upright in bed.  Calm, conversant.  Makes little eye contact.  MSK Bilateral Hemovac drains in place on arrival.  Mild serosanguineous drainage.  No purulence. Neurovascularly intact Sensation intact distally Feet warm Dorsiflexion/Plantar flexion intact Incisions:  Left knee prepatellar bursectomy incision healed.  No erythema swelling or sign of infection. Bilateral hip incisions clean dry and intact with nylon suture.  Dressings C/D/I.  Scant bloody drainage.     Assessment: 1 [place] Post-Op    S/P Procedure(s) (LRB): IRRIGATION AND DEBRIDEMENT HIP (Bilateral) by Dr. Ernesta Amble. Percell Miller on 10/04/17  Principal Problem:   Bacteremia due to Staphylococcus aureus Active Problems:   Heroin abuse ([place])   Polysubstance abuse (HCC)   Severe sepsis (HCC)   Cigarette smoker   Hypokalemia   DIC (disseminated intravascular coagulation) (HCC)   Staphylococcal arthritis of right shoulder (HCC)   Septic infrapatellar bursitis of left knee   Staphylococcal arthritis of left hip (HCC)   Staphylococcal arthritis of right hip (HCC)   Endocarditis of tricuspid valve   Calf abscess   Pain   Pyomyositis   Saphenous vein thrombophlebitis, right   Anxiety   Bilateral hip pain with MRI concerning for septic arthritis Doing well postop [place] 1. Pain better today than before surgery. Hemovac drains with minimal serosanguineous drainage.  No purulence.  Left knee prepatellar septic bursitis, status post I&D/bursectomy. Incision fully healed.  No sign of infection.  Plan: I pulled bilateral Hemovac drains this morning and placed clean dry dressings. Maintain clean dressings as needed. Apply ice and compression. Remove  suture ~7-10 days post op  Weight Bearing: Weight Bearing as Tolerated (WBAT) BLE Dressings: Dry dressings and hips as needed. VTE prophylaxis: Per primary team.  No orthopedic contraindication to chemical DVT prophylaxis.  Please call with questions.   Charna Elizabeth Martensen III, PA-C 10/05/2017, 8:04 AM

## 2017-10-06 ENCOUNTER — Inpatient Hospital Stay (HOSPITAL_COMMUNITY): Payer: Self-pay

## 2017-10-06 LAB — URINALYSIS, ROUTINE W REFLEX MICROSCOPIC
BILIRUBIN URINE: NEGATIVE
Bacteria, UA: NONE SEEN
GLUCOSE, UA: NEGATIVE mg/dL
KETONES UR: NEGATIVE mg/dL
LEUKOCYTES UA: NEGATIVE
NITRITE: NEGATIVE
PROTEIN: NEGATIVE mg/dL
Specific Gravity, Urine: 1.017 (ref 1.005–1.030)
pH: 7 (ref 5.0–8.0)

## 2017-10-06 LAB — CBC WITH DIFFERENTIAL/PLATELET
BASOS ABS: 0 10*3/uL (ref 0.0–0.1)
BASOS PCT: 0 %
Eosinophils Absolute: 0.3 10*3/uL (ref 0.0–0.7)
Eosinophils Relative: 2 %
HCT: 27.3 % — ABNORMAL LOW (ref 39.0–52.0)
HEMOGLOBIN: 8.8 g/dL — AB (ref 13.0–17.0)
Lymphocytes Relative: 20 %
Lymphs Abs: 2.4 10*3/uL (ref 0.7–4.0)
MCH: 30.2 pg (ref 26.0–34.0)
MCHC: 32.2 g/dL (ref 30.0–36.0)
MCV: 93.8 fL (ref 78.0–100.0)
Monocytes Absolute: 1.2 10*3/uL — ABNORMAL HIGH (ref 0.1–1.0)
Monocytes Relative: 9 %
NEUTROS ABS: 8.5 10*3/uL — AB (ref 1.7–7.7)
NEUTROS PCT: 69 %
Platelets: 281 10*3/uL (ref 150–400)
RBC: 2.91 MIL/uL — AB (ref 4.22–5.81)
RDW: 12.9 % (ref 11.5–15.5)
WBC: 12.3 10*3/uL — ABNORMAL HIGH (ref 4.0–10.5)

## 2017-10-06 LAB — BASIC METABOLIC PANEL
ANION GAP: 9 (ref 5–15)
BUN: 7 mg/dL (ref 6–20)
CHLORIDE: 94 mmol/L — AB (ref 101–111)
CO2: 27 mmol/L (ref 22–32)
Calcium: 7.8 mg/dL — ABNORMAL LOW (ref 8.9–10.3)
Creatinine, Ser: 0.55 mg/dL — ABNORMAL LOW (ref 0.61–1.24)
GFR calc non Af Amer: >60 mL/min (ref >60)
Glucose, Bld: 103 mg/dL — ABNORMAL HIGH (ref 65–99)
POTASSIUM: 4.1 mmol/L (ref 3.5–5.1)
Sodium: 130 mmol/L — ABNORMAL LOW (ref 135–145)

## 2017-10-06 MED ORDER — ENOXAPARIN SODIUM 100 MG/ML ~~LOC~~ SOLN
100.0000 mg | SUBCUTANEOUS | Status: DC
Start: 2017-10-06 — End: 2017-10-07
  Administered 2017-10-06: 100 mg via SUBCUTANEOUS
  Filled 2017-10-06 (×2): qty 1

## 2017-10-06 NOTE — Progress Notes (Signed)
Patient refusing lovenox tonight.Patient stated," I don't want that shot tonight.I"ll take it tomorrow." Reeducated patient on medication encouraging patient to turn and reposition self in bed.Patients just states," I know."Will continue to monitor.

## 2017-10-06 NOTE — Progress Notes (Signed)
PROGRESS NOTE  Gary Hicks YKD:[id number] DOB: September 09, 1985 DOA: 09/11/2017 PCP: Patient, [name] Per  HPI/Recap of past 37 hours: 32 year old male, active IVDA, who presents with generalized pain. Patient was incarcerated and released 3-4 weeks ago. Patient admits using IV methamphetamine as well as heroin in the past 24 hours prior to admission. Reports global polyarthralgias including his bilateral feet, bilateral knees, bilateral hips and shoulders. Also reports fevers and chills generalized myalgias. Pt admitted for further management. 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 Pt was also diagnosed with DVT Right gastrocnemius vein . He was started on Lovenox 2/18. He was also diagnosed with left knee septic infection. He underwent bursectomy and I and D by Dr Percell Miller 2-25. He reported worsening left hip pain 3-02, MRI showed, B/L hip Septic arthritis and 13 mm fluid collection in the posterior aspect of the left obturator internus muscle most concerning for a small abscess. IR was consulted for arthrocentesis, patient couldn't tolerate procedure. Dr Percell Miller performed I and D of bilateral hip on 3/5.  Today, pt still c/o hip pain. Encouraged pt to have a bed bath and participate in PT which he agreed to. Lovenox changed to once daily as pt has refused some doses. Pt denies any chest pain, SOB, N/V, fever/chills, abdominal pain.   Assessment/Plan: Principal Problem:   Bacteremia due to Staphylococcus aureus Active Problems:   Heroin abuse (HCC)   Polysubstance abuse (HCC)   Severe sepsis (HCC)   Cigarette smoker   Hypokalemia   DIC (disseminated intravascular coagulation) (HCC)   Staphylococcal arthritis of right shoulder (HCC)   Septic infrapatellar bursitis of left knee   Staphylococcal arthritis of left hip (HCC)   Staphylococcal arthritis of right hip (HCC)   Endocarditis of tricuspid valve   Calf abscess   Pain   Pyomyositis   Saphenous  vein thrombophlebitis, right   Anxiety  Tricuspid valve endocarditis with MSSA bacteremia Spiked a temp of 100.5 on 3/6 with leukocytosis, ?? Post op Vs sepsis Repeat sepsis w/o: BC x2, CXR, U/A & UC Blood culture 2/11 grew MSSA, repeat on 2/15 NGTD ECHO showed: Tricuspid valve: There was a large, 1.1 cm (W) x 1.8 cm (L), multilobulated, highly mobile vegetation on the right ventricular aspect of the anterior leaflet. 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  -S/P I&D and bursectomy of L Knee prepatellar bursitis on 09/23/17 -S/P I&D of septic arthritis bilateral hip on 10/04/17 - Cultures from I&D, NGTD -MRI left knee: 4.8 x 0.8 x 4 cm fluid collection in subcutaneous fat anterior to patellar tendon with severe surrounding soft tissue edema concerning for septic bursitis -MRI Right shoulder: Mild supraspinatus and infraspinatus muscle edema suggestive of Myositis -MRI brain negative for abscess -MRI C spine: Motion degraded exam. C2-3 through C7-T1 no bulge, stenosis or foraminal narrowing. Mild C3-4 through C5-6 facet arthropathy  -Doppler left arm: Negative for DVT -MRI hip showed septic arthritis and 13 mm fluid collection in the posterior aspect of the left obturator internus muscle most concerning for a small abscess -Ortho on board  Malnutrition; report poor appetite. Ensure. Nutrition consulted.   IVDA  Dr Damita Dunnings managing Suboxone BID  No high dose IV benzo unless  required for seizure Discussed with Dr Lalla Brothers, we could do Dilaudid 2 Mg IV Q 4 hours post Sx for 24 to 48 hours. Hold Klonopin while patient is on Dilaudid.   GAD Psych consulted: start zoloft 50 mg daily May continue Klonopin prn for 2 weeks and wean off, not  advised for long term.  Please refer to note dated 2/22 by psych attending for more info  Seizures Likely related to drug use Seizure precautions PRN Ativan MRI negative  Iron def anemia Continue POiron Hb stable  DVT right Gastrocnemius vein Also superficial saphenous thrombophlebitis.  Restart lovenox, ortho ok for AC  Thrombocytopenia Resolved  Hyponatremia Fluctuates.  Daily BMP   Hepatitis c antibody positive ID on board  Hypokalemia; resolved.   Vitamin D deficiency; started on supplementation.      Code Status: Full  Family Communication: None at bedside   Disposition Plan: Until IV AB is completed   Consultants:  ID  Ortho  Procedures:  I&D of L knee on 09/23/17  I &D of bilateral hip on 10/04/17  Antimicrobials:  IV Ancef   DVT prophylaxis:  Therapeutic lovenox   Objective: Vitals:   10/05/17 1803 10/05/17 2052 10/06/17 0441 10/06/17 1201  BP: (!) 128/49 113/68 99/72 95/62   Pulse: 65 (!) 116 100 (!) 104  Resp: 18 16 16 18   Temp: 98.3 F (36.8 C) (!) 100.5 F (38.1 C) 99 F (37.2 C) 99.1 F (37.3 C)  TempSrc: Oral   Oral  SpO2: 95% 98% 96% 98%  Weight:  59 kg (130 lb 1.1 oz)    Height:        Intake/Output Summary (Last 24 hours) at 10/06/2017 1659 Last data filed at 10/06/2017 1300 Gross per 24 hour  Intake 1860 ml  Output 2200 ml  Net -340 ml   Filed Weights   09/22/17 2110 09/23/17 1328 10/05/17 2052  Weight: 74 kg (163 lb 2.3 oz) 74 kg (163 lb 2.3 oz) 59 kg (130 lb 1.1 oz)    Exam:   General:  NAD  Cardiovascular: S1, S2 RRR  Respiratory: CTA  Abdomen: Soft, NT, ND, BS+   Musculoskeletal: L knee with dressing   Skin: Normal, no rash  Psychiatry: Flat affect   Data Reviewed: CBC: Recent Labs  Lab 09/30/17 0406 10/02/17 0439 10/03/17 0427 10/04/17 0306 10/06/17 0405  WBC 9.5 9.1 8.9 9.1 12.3*  NEUTROABS  --   --   --   --  8.5*  HGB 9.5* 10.0* 9.2* 9.2* 8.8*  HCT 29.2* 30.1* 28.3* 28.2*  27.3*  MCV 95.4 95.0 95.3 94.6 93.8  PLT 297 327 268 272 502   Basic Metabolic Panel: Recent Labs  Lab 09/30/17 0406 10/03/17 0427 10/06/17 0405  NA 132* 133* 130*  K 4.1 4.0 4.1  CL 95* 96* 94*  CO2 27 29 27   GLUCOSE 110* 113* 103*  BUN 12 7 7   CREATININE 0.62 0.61 0.55*  CALCIUM 8.6* 8.6* 7.8*   GFR: Estimated Creatinine Clearance: 111.6 mL/min (A) (by C-G formula based on SCr of 0.55 mg/dL (L)). Liver Function Tests: No results for input(s): AST, ALT, ALKPHOS, BILITOT, PROT, ALBUMIN in the last 168 hours. No results for input(s): LIPASE, AMYLASE in the last 168 hours. No results for input(s): AMMONIA in the last 168 hours. Coagulation Profile: Recent Labs  Lab 10/03/17 0905  INR 1.27   Cardiac Enzymes: No results for input(s): CKTOTAL, CKMB, CKMBINDEX, TROPONINI in the last 168 hours. BNP (last 3 results) No  results for input(s): PROBNP in the last 8760 hours. HbA1C: No results for input(s): HGBA1C in the last 72 hours. CBG: No results for input(s): GLUCAP in the last 168 hours. Lipid Profile: No results for input(s): CHOL, HDL, LDLCALC, TRIG, CHOLHDL, LDLDIRECT in the last 72 hours. Thyroid Function Tests: No results for input(s): TSH, T4TOTAL, FREET4, T3FREE, THYROIDAB in the last 72 hours. Anemia Panel: No results for input(s): VITAMINB12, FOLATE, FERRITIN, TIBC, IRON, RETICCTPCT in the last 72 hours. Urine analysis:    Component Value Date/Time   COLORURINE YELLOW 09/10/2017 1657   APPEARANCEUR CLEAR 09/10/2017 1657   LABSPEC 1.019 09/10/2017 1657   PHURINE 6.0 09/10/2017 1657   GLUCOSEU NEGATIVE 09/10/2017 1657   HGBUR NEGATIVE 09/10/2017 1657   BILIRUBINUR NEGATIVE 09/10/2017 1657   KETONESUR NEGATIVE 09/10/2017 1657   PROTEINUR NEGATIVE 09/10/2017 1657   UROBILINOGEN 0.2 05/16/2012 0847   NITRITE NEGATIVE 09/10/2017 1657   LEUKOCYTESUR NEGATIVE 09/10/2017 1657   Sepsis Labs: @LABRCNTIP (procalcitonin:4,lacticidven:4)  )No results found for this  or any previous visit (from the past 240 hour(s)).    Studies: Dg Chest Port 1 View  Result Date: 10/06/2017 CLINICAL DATA:  Fever and left upper chest pain for 1 week. EXAM: PORTABLE CHEST 1 VIEW COMPARISON:  Chest CTA 09/25/2017 FINDINGS: A right PICC terminates over the high right atrium. The cardiac silhouette is normal in size. Veiling opacity in the left mid and lower hemithorax is consistent with a persistent, small to moderate-sized pleural effusion. Mild parenchymal opacity in the left lung base likely represents atelectasis. No right lung consolidation is seen. Nodular right lung opacities on the prior CT are not clearly apparent on this radiograph. No pneumothorax is identified. No acute osseous abnormality is seen. IMPRESSION: Left pleural effusion and mild left basilar atelectasis. Electronically Signed   By: Logan Bores M.D.   On: 10/06/2017 11:12    Scheduled Meds: . buprenorphine-naloxone  1 tablet Sublingual BID  . Chlorhexidine Gluconate Cloth  6 each Topical Daily  . clonazePAM  0.5 mg Oral BID  . enoxaparin (LOVENOX) injection  100 mg Subcutaneous Q24H  . famotidine  20 mg Oral Daily  . feeding supplement (ENSURE ENLIVE)  237 mL Oral TID BM  . ferrous sulfate  325 mg Oral BID WC  . multivitamin with minerals  1 tablet Oral Daily  . mupirocin ointment  1 application Nasal BID  . nicotine  14 mg Transdermal Daily  . potassium chloride  40 mEq Oral BID  . sertraline  50 mg Oral Daily  . Vitamin D (Ergocalciferol)  50,000 Units Oral Q Mon    Continuous Infusions: . sodium chloride 100 mL/hr at 10/06/17 1050  .  ceFAZolin (ANCEF) IV Stopped (10/06/17 1541)  . lactated ringers 10 mL/hr at 09/23/17 1329  . lactated ringers 10 mL/hr at 10/04/17 1237     LOS: 25 days     Alma Friendly, MD [facility]   If 7PM-7AM, please contact night-coverage [email or web address] Password [email or web address] 10/06/2017, 4:59 PM

## 2017-10-06 NOTE — Progress Notes (Signed)
Patient has refused BID dosing of Lovenox this admit. To ensure better compliance Lovenox will be switched to 1.5mg /kg [place] Q24h. He last refused his dose last night after Ortho ok with restarting. Not sure if new weight correct since it is significant drop from previous weights. Will still use a weight of ~70 kg. Hgb 8.8, plts wnl.  Plan: Start enoxaparin 100mg  [place] Q24h today Monitor  CBC, s/s of bleed  Elenor Quinones, PharmD, [facility] Clinical Pharmacist Pager [phone] 10/06/2017 8:09 AM

## 2017-10-06 NOTE — Progress Notes (Signed)
Physical Therapy Treatment Patient Details Name: Gary Hicks MRN: [id number] DOB: Sep 17, 1985 Today's Date: 10/06/2017    History of Present Illness Pt is a 32 y/o male with active IV drug use presenting with generalized pain. Family is concerned about seizures due to finding him in fetal position with abnormal eye movements. He reports global polyarthralgia, use of heroin and methamphetamines in the past 24 hours at time of admission. Diagnosed with staph bacteremia, severe sepsis. PMH bacterial endocarditis, heroin abuse, possible Hepatitis C, opiate abuse, polysubstance abuse, seizures.  Pt also with positive right LE DVT on 2/18.  On Lovenox.      PT Comments    With maximal verbal encouragement agreed to work with PT today. Pt limited in his mobility by bilateral hip and knee pain as well as anxiety about onset of pain. With increased time and effort pt was able to sit EOB with minA for LE management. Pt then became very anxious about standing up causing an increase in HR. With breathing exercises pt HR dropped and he was able to stand with min guard assist and use of RW. Pt able to perform marching at side of bed. Based on current level of function d/c plans remain appropriate at this time. PT will continue to follow acutely until d/c.    Follow Up Recommendations  SNF;Other (comment)(pending further mobility progress)     Equipment Recommendations  Rolling walker with 5" wheels    Recommendations for Other Services       Precautions / Restrictions Precautions Precautions: Fall Restrictions Weight Bearing Restrictions: Yes RLE Weight Bearing: Weight bearing as tolerated LLE Weight Bearing: Weight bearing as tolerated    Mobility  Bed Mobility Overal bed mobility: Needs Assistance Bed Mobility: Supine to Sit     Supine to sit: Min assist;HOB elevated Sit to supine: Min assist;HOB elevated   General bed mobility comments: minA for management of LE out of and into bed,  increased time and effort, pt using R LE to assist L LE with movement; pt grimacing with movement and reporting bilateral hip pain  Transfers Overall transfer level: Needs assistance Equipment used: Rolling walker (2 wheeled) Transfers: Sit to/from Stand Sit to Stand: Min guard         General transfer comment: min guard for safety; bed in elevated position,   Ambulation/Gait             General Gait Details: unable at this time       Balance Overall balance assessment: Needs assistance Sitting-balance support: Feet supported;Bilateral upper extremity supported Sitting balance-Leahy Scale: Fair     Standing balance support: No upper extremity supported Standing balance-Leahy Scale: Fair                              Cognition Arousal/Alertness: Awake/alert Behavior During Therapy: WFL for tasks assessed/performed Overall Cognitive Status: Within Functional Limits for tasks assessed                                        Exercises General Exercises - Lower Extremity Hip Flexion/Marching: AROM;Right;Left;Standing(3x L LE, 2x R LE) Other Exercises Other Exercises: Breathing exercises for anxiety     General Comments General comments (skin integrity, edema, etc.): Pt very anxious about getting up to his feet, HR rose to 134 bpm., therapist engaged pt in breathing exercises and  pt HR decreased to 110 bpm and he was able to stand up.       Pertinent Vitals/Pain Pain Assessment: Faces Faces Pain Scale: Hurts whole lot Pain Location: bilateral hips and knees  Pain Descriptors / Indicators: Grimacing;Guarding;Aching;Constant Pain Intervention(s): Premedicated before session;Monitored during session;Limited activity within patient's tolerance;Repositioned;Relaxation           PT Goals (current goals can now be found in the care plan section) Acute Rehab PT Goals PT Goal Formulation: With patient/family Time For Goal Achievement:  10/01/17 Potential to Achieve Goals: Good    Frequency    Min 3X/week      PT Plan Current plan remains appropriate       AM-PAC PT "6 Clicks" Daily Activity  Outcome Measure  Difficulty turning over in bed (including adjusting bedclothes, sheets and blankets)?: A Little Difficulty moving from lying on back to sitting on the side of the bed? : A Little Difficulty sitting down on and standing up from a chair with arms (e.g., wheelchair, bedside commode, etc,.)?: Unable Help needed moving to and from a bed to chair (including a wheelchair)?: A Little Help needed walking in hospital room?: A Lot Help needed climbing 3-5 steps with a railing? : Total 6 Click Score: 13    End of Session   Activity Tolerance: Patient limited by pain Patient left: with call bell/phone within reach;in bed Nurse Communication: Mobility status PT Visit Diagnosis: Muscle weakness (generalized) (M62.81);Difficulty in walking, not elsewhere classified (R26.2);Pain Pain - Right/Left: (bilateral) Pain - part of body: Hip     Time: 1207-1242 PT Time Calculation (min) (ACUTE ONLY): 35 min  Charges:  $Therapeutic Exercise: 8-22 mins $Therapeutic Activity: 8-22 mins                    G Codes:       Dalin Caldera B. Migdalia Dk PT, DPT Acute Rehabilitation  [phone] Pager [phone]     [place] 10/06/2017, 4:50 PM

## 2017-10-07 DIAGNOSIS — R079 Chest pain, unspecified: Secondary | ICD-10-CM

## 2017-10-07 DIAGNOSIS — R450 Nervousness: Secondary | ICD-10-CM

## 2017-10-07 DIAGNOSIS — R454 Irritability and anger: Secondary | ICD-10-CM

## 2017-10-07 DIAGNOSIS — G4700 Insomnia, unspecified: Secondary | ICD-10-CM

## 2017-10-07 DIAGNOSIS — R4587 Impulsiveness: Secondary | ICD-10-CM

## 2017-10-07 DIAGNOSIS — F329 Major depressive disorder, single episode, unspecified: Secondary | ICD-10-CM

## 2017-10-07 LAB — CBC WITH DIFFERENTIAL/PLATELET
BASOS PCT: 0 %
Basophils Absolute: 0 10*3/uL (ref 0.0–0.1)
EOS ABS: 0.2 10*3/uL (ref 0.0–0.7)
EOS PCT: 3 %
HCT: 27 % — ABNORMAL LOW (ref 39.0–52.0)
HEMOGLOBIN: 8.9 g/dL — AB (ref 13.0–17.0)
Lymphocytes Relative: 21 %
Lymphs Abs: 1.8 10*3/uL (ref 0.7–4.0)
MCH: 31.1 pg (ref 26.0–34.0)
MCHC: 33 g/dL (ref 30.0–36.0)
MCV: 94.4 fL (ref 78.0–100.0)
Monocytes Absolute: 0.6 10*3/uL (ref 0.1–1.0)
Monocytes Relative: 7 %
NEUTROS PCT: 69 %
Neutro Abs: 5.8 10*3/uL (ref 1.7–7.7)
PLATELETS: 257 10*3/uL (ref 150–400)
RBC: 2.86 MIL/uL — AB (ref 4.22–5.81)
RDW: 13.2 % (ref 11.5–15.5)
WBC: 8.4 10*3/uL (ref 4.0–10.5)

## 2017-10-07 LAB — BASIC METABOLIC PANEL
Anion gap: 9 (ref 5–15)
BUN: 8 mg/dL (ref 6–20)
CHLORIDE: 100 mmol/L — AB (ref 101–111)
CO2: 28 mmol/L (ref 22–32)
CREATININE: 0.51 mg/dL — AB (ref 0.61–1.24)
Calcium: 8.3 mg/dL — ABNORMAL LOW (ref 8.9–10.3)
Glucose, Bld: 107 mg/dL — ABNORMAL HIGH (ref 65–99)
POTASSIUM: 3.8 mmol/L (ref 3.5–5.1)
SODIUM: 137 mmol/L (ref 135–145)

## 2017-10-07 MED ORDER — CLONAZEPAM 0.5 MG PO TABS
0.5000 mg | ORAL_TABLET | Freq: Every evening | ORAL | Status: DC | PRN
  Administered 2017-10-07: 0.5 mg via ORAL
  Filled 2017-10-07: qty 1

## 2017-10-07 MED ORDER — ENOXAPARIN SODIUM 100 MG/ML ~~LOC~~ SOLN
90.0000 mg | SUBCUTANEOUS | Status: DC
  Administered 2017-10-09 – 2017-10-17 (×8): 90 mg via SUBCUTANEOUS
  Filled 2017-10-07 (×12): qty 1

## 2017-10-07 NOTE — Progress Notes (Signed)
PROGRESS NOTE  Gary Hicks CXK:[id number] DOB: 1986/07/25 DOA: 09/11/2017 PCP: Patient, [name] Per  HPI/Recap of past 27 hours: 32 year old male, active IVDA, who presents with generalized pain. Patient was incarcerated and released 3-4 weeks ago. Patient admits using IV methamphetamine as well as heroin in the past 24 hours prior to admission. Reports global polyarthralgias including his bilateral feet, bilateral knees, bilateral hips and shoulders. Also reports fevers and chills generalized myalgias. Pt admitted for further management. 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 Pt was also diagnosed with DVT Right gastrocnemius vein . He was started on Lovenox 2/18. He was also diagnosed with left knee septic infection. He underwent bursectomy and I and D by Dr Percell Miller 2-25. He reported worsening left hip pain 3-02, MRI showed, B/L hip Septic arthritis and 13 mm fluid collection in the posterior aspect of the left obturator internus muscle most concerning for a small abscess. IR was consulted for arthrocentesis, patient couldn't tolerate procedure. Dr Percell Miller performed I and D of bilateral hip on 3/5.  Today, pt still c/o bilateral hip pain, but reports its improving. Refused PT today. Pt denies any chest pain, SOB, N/V, fever/chills, abdominal pain. Pt still with poor mood. Requests psych visit. Psych consult placed.   Assessment/Plan: Principal Problem:   Bacteremia due to Staphylococcus aureus Active Problems:   Heroin abuse (HCC)   Polysubstance abuse (HCC)   Severe sepsis (HCC)   Cigarette smoker   Hypokalemia   DIC (disseminated intravascular coagulation) (HCC)   Staphylococcal arthritis of right shoulder (HCC)   Septic infrapatellar bursitis of left knee   Staphylococcal arthritis of left hip (HCC)   Staphylococcal arthritis of right hip (HCC)   Endocarditis of tricuspid valve   Calf abscess   Pain   Pyomyositis   Saphenous vein  thrombophlebitis, right   Anxiety  Tricuspid valve endocarditis with MSSA bacteremia Afebrile with resolved leukocytosis Repeat BC x2 pending, CXR no PNA, U/A neg. UC pending Blood culture 2/11 grew MSSA, repeat on 2/15 NGTD ECHO showed: Tricuspid valve: There was a large, 1.1 cm (W) x 1.8 cm (L), multilobulated, highly mobile vegetation on the right ventricular aspect of the anterior leaflet. 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  -S/P I&D and bursectomy of L Knee prepatellar bursitis on 09/23/17 -S/P I&D of septic arthritis bilateral hip on 10/04/17 - Cultures from I&D, NGTD -MRI left knee: 4.8 x 0.8 x 4 cm fluid collection in subcutaneous fat anterior to patellar tendon with severe surrounding soft tissue edema concerning for septic bursitis -MRI Right shoulder: Mild supraspinatus and infraspinatus muscle edema suggestive of Myositis -MRI brain negative for abscess -MRI C spine: Motion degraded exam. C2-3 through C7-T1 no bulge, stenosis or foraminal narrowing. Mild C3-4 through C5-6 facet arthropathy  -Doppler left arm: Negative for DVT -MRI hip showed septic arthritis and 13 mm fluid collection in the posterior aspect of the left obturator internus muscle most concerning for a small abscess -Ortho on board  Malnutrition; report poor appetite. Ensure. Nutrition consulted.   IVDA  Dr Damita Dunnings managing Suboxone BID  No high dose IV benzo unless required for seizure  [facility] Psych consulted: start zoloft 50 mg daily May continue Klonopin  prn for 2 weeks and wean off, not advised for long term.  Re-consulted Psych for follow up, will see patient  Seizures Likely related to drug use Seizure precautions PRN Ativan MRI  negative  Iron def anemia Continue POiron Hb stable  DVT right Gastrocnemius vein Also superficial saphenous thrombophlebitis.  Restart lovenox, ortho ok for AC  Thrombocytopenia Resolved  Hyponatremia Resolved  Daily BMP   Hepatitis c antibody positive ID on board  Hypokalemia; resolved.   Vitamin D deficiency; started on supplementation.      Code Status: Full  Family Communication: None at bedside   Disposition Plan: Until IV AB is completed   Consultants:  ID  Ortho  Procedures:  I&D of L knee on 09/23/17  I &D of bilateral hip on 10/04/17  Antimicrobials:  IV Ancef   DVT prophylaxis:  Therapeutic lovenox   Objective: Vitals:   10/06/17 1741 10/06/17 2218 10/07/17 0620 10/07/17 0914  BP: (!) 96/59 (!) 108/57 104/69 108/66  Pulse: 79 80 (!) 118 (!) 113  Resp: 18 19 20 18   Temp: 99.8 F (37.7 C) 98.5 F (36.9 C) 98.4 F (36.9 C) 98.5 F (36.9 C)  TempSrc: Oral Oral Oral Oral  SpO2: 97% 98% 99% 97%  Weight:  59.6 kg (131 lb 6.3 oz)    Height:        Intake/Output Summary (Last 24 hours) at 10/07/2017 1325 Last data filed at 10/07/2017 0906 Gross per 24 hour  Intake 3270 ml  Output 1750 ml  Net 1520 ml   Filed Weights   09/23/17 1328 10/05/17 2052 10/06/17 2218  Weight: 74 kg (163 lb 2.3 oz) 59 kg (130 lb 1.1 oz) 59.6 kg (131 lb 6.3 oz)    Exam:   General:  NAD  Cardiovascular: S1, S2 RRR  Respiratory: CTA  Abdomen: Soft, NT, ND, BS+   Musculoskeletal: L knee with dressing   Skin: Normal, no rash  Psychiatry: Flat affect   Data Reviewed: CBC: Recent Labs  Lab 10/02/17 0439 10/03/17 0427 10/04/17 0306 10/06/17 0405 10/07/17 0454  WBC 9.1 8.9 9.1 12.3* 8.4  NEUTROABS  --   --   --  8.5* 5.8  HGB 10.0* 9.2* 9.2* 8.8* 8.9*  HCT 30.1* 28.3* 28.2* 27.3* 27.0*  MCV 95.0 95.3 94.6 93.8 94.4  PLT 327 268 272 281 650   Basic Metabolic Panel: Recent Labs  Lab 10/03/17 0427 10/06/17 0405 10/07/17 0454  NA  133* 130* 137  K 4.0 4.1 3.8  CL 96* 94* 100*  CO2 29 27 28   GLUCOSE 113* 103* 107*  BUN 7 7 8   CREATININE 0.61 0.55* 0.51*  CALCIUM 8.6* 7.8* 8.3*   GFR: Estimated Creatinine Clearance: 112.8 mL/min (A) (by C-G formula based on SCr of 0.51 mg/dL (L)). Liver Function Tests: No results for input(s): AST, ALT, ALKPHOS, BILITOT, PROT, ALBUMIN in the last 168 hours. No results for input(s): LIPASE, AMYLASE in the last 168 hours. No results for input(s): AMMONIA in the last 168 hours. Coagulation Profile: Recent Labs  Lab 10/03/17 0905  INR 1.27   Cardiac Enzymes: No results for input(s): CKTOTAL, CKMB, CKMBINDEX, TROPONINI in the last 168 hours. BNP (last 3 results) No results for input(s): PROBNP in the last 8760 hours. HbA1C: No results for input(s): HGBA1C in the last 72 hours. CBG: No results for input(s): GLUCAP in the last 168 hours. Lipid Profile: No results for input(s): CHOL, HDL, LDLCALC, TRIG, CHOLHDL, LDLDIRECT in the last 72 hours. Thyroid Function Tests:  No results for input(s): TSH, T4TOTAL, FREET4, T3FREE, THYROIDAB in the last 72 hours. Anemia Panel: No results for input(s): VITAMINB12, FOLATE, FERRITIN, TIBC, IRON, RETICCTPCT in the last 72 hours. Urine analysis:    Component Value Date/Time   COLORURINE YELLOW 10/06/2017 1746   APPEARANCEUR CLEAR 10/06/2017 1746   LABSPEC 1.017 10/06/2017 1746   PHURINE 7.0 10/06/2017 1746   GLUCOSEU NEGATIVE 10/06/2017 1746   HGBUR MODERATE (A) 10/06/2017 1746   BILIRUBINUR NEGATIVE 10/06/2017 1746   KETONESUR NEGATIVE 10/06/2017 1746   PROTEINUR NEGATIVE 10/06/2017 1746   UROBILINOGEN 0.2 05/16/2012 0847   NITRITE NEGATIVE 10/06/2017 1746   LEUKOCYTESUR NEGATIVE 10/06/2017 1746   Sepsis Labs: @LABRCNTIP (procalcitonin:4,lacticidven:4)  )No results found for this or any previous visit (from the past 240 hour(s)).    Studies: No results found.  Scheduled Meds: . buprenorphine-naloxone  1 tablet Sublingual BID   . Chlorhexidine Gluconate Cloth  6 each Topical Daily  . clonazePAM  0.5 mg Oral BID  . [START ON 10/08/2017] enoxaparin (LOVENOX) injection  90 mg Subcutaneous Q24H  . famotidine  20 mg Oral Daily  . feeding supplement (ENSURE ENLIVE)  237 mL Oral TID BM  . ferrous sulfate  325 mg Oral BID WC  . multivitamin with minerals  1 tablet Oral Daily  . mupirocin ointment  1 application Nasal BID  . nicotine  14 mg Transdermal Daily  . potassium chloride  40 mEq Oral BID  . sertraline  50 mg Oral Daily  . Vitamin D (Ergocalciferol)  50,000 Units Oral Q Mon    Continuous Infusions: . sodium chloride 100 mL/hr at 10/07/17 0906  .  ceFAZolin (ANCEF) IV 2 g (10/07/17 0629)  . lactated ringers 10 mL/hr at 09/23/17 1329  . lactated ringers 10 mL/hr at 10/04/17 1237     LOS: 26 days     Alma Friendly, MD [facility]   If 7PM-7AM, please contact night-coverage [email or web address] Password [email or web address] 10/07/2017, 1:25 PM

## 2017-10-07 NOTE — Progress Notes (Signed)
PT Cancellation Note  Patient Details Name: Miron Marxen Baldridge MRN: [id number] DOB: 1986/05/19   Cancelled Treatment:    Reason Eval/Treat Not Completed: Patient declined, no reason specified Pt requesting PT come back at a later time as he is not feeling well. Will reattempt as schedule allows.   Leighton Ruff, PT, DPT  Acute Rehabilitation Services  Pager: [phone]  Rudean Hitt 10/07/2017, 11:23 AM

## 2017-10-07 NOTE — Progress Notes (Signed)
Patient has not had a bowel movement since 09/29/17. Patient is refusing any medication to help with constipation.

## 2017-10-07 NOTE — Consult Note (Signed)
[facility] Psych Consult Progress Note  10/07/2017 12:46 PM Gary Hicks  MRN:  [id number] Subjective:   Gary Hicks was last seen on 2/22 for management of anxiety. He was started on Zoloft 50 mg daily for anxiety and mood on 2/22 and Trazodone 50 mg qhs PRN for sleep. He continues to receive Klonopin 0.5 mg BID.   On interview, Gary Hicks initially reports no effect from the medication for anxiety or mood and later reports that he does not want to increase the medication due to risk of side effects. He was informed of the more common side effects such as diarrhea, stomach upset or emotional blunting. He subsequently reports that he has been more irritable since starting Zoloft. He reports that his irritability is only towards his father. He is unsure if there may be other factors that are leading to irritability such as isolation in the hospital. He asks about who will continue to prescribe his Suboxone and Klonopin at discharge. He reports no substance cravings since starting Suboxone. He denies problems with sleep. He reports fluctuations in his appetite. He was noted to have several snacks on his table. He denies SI, HI or AVH.    Principal Problem: Anxiety Diagnosis:   Patient Active Problem List   Diagnosis Date Noted  . Anxiety [F41.9]   . 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 Pain [R52]   . Calf abscess [L02.419]   . DIC (disseminated intravascular coagulation) ([place]) [D65]   . 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 Septic infrapatellar bursitis of left knee [M71.162, B96.89]   . Staphylococcal arthritis of left hip ([place]) [M00.052]   . Staphylococcal arthritis of right hip ([place]) [M00.051]   . 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 Bacteremia due to Staphylococcus aureus [R78.81] 09/11/2017  . Normocytic anemia [D64.9] 09/11/2017  . Acute kidney injury ([place]) [N17.9] 09/11/2017  . Cigarette smoker [F17.210] 09/11/2017  .  Hypokalemia [E87.6] 09/11/2017  . Severe sepsis ([place]) [A41.9, R65.20] 09/10/2017  . Heroin abuse ([place]) [F11.10] 01/05/2014  . Polysubstance abuse ([place]) [F19.10] 01/05/2014   Total Time spent with patient: 15 minutes  Past Psychiatric History: Opiate abuse including heroin, Roxicodone and Methadone.    Past Medical History:  Past Medical History:  Diagnosis Date  . Bacterial endocarditis   . Heroin abuse ([place])   . Liver disease    possible Hep. C no treatment or confirmation   . Opiate abuse, continuous ([place])   . Polysubstance abuse ([place])   . Seizures ([place])     Past Surgical History:  Procedure Laterality Date  . CHOLECYSTECTOMY    . HAND SURGERY    . I&D EXTREMITY Left 09/23/2017   Procedure: IRRIGATION AND DEBRIDEMENT KNEE;  Surgeon: Renette Butters, MD;  Location: [place];  Service: Orthopedics;  Laterality: Left;  . INCISION AND DRAINAGE HIP Bilateral 10/04/2017   Procedure: IRRIGATION AND DEBRIDEMENT HIP;  Surgeon: Renette Butters, MD;  Location: [place];  Service: Orthopedics;  Laterality: Bilateral;   Family History: History reviewed. No pertinent family history. Family Psychiatric  History: Denies  Social History:  Social History   Substance and Sexual Activity  Alcohol Use No     Social History   Substance and Sexual Activity  Drug Use Yes  . Types: IV   Comment: heroin    Social History   Socioeconomic History  . Marital status: Married    Spouse name: None  .  Number of children: None  . Years of education: None  . Highest education level: None  Social Needs  . Financial resource strain: None  . Food insecurity - worry: None  . Food insecurity - inability: None  . Transportation needs - medical: None  . Transportation needs - non-medical: None  Occupational History  . None  Tobacco Use  . Smoking status: Current Every [place] Smoker    Packs/[place]: 0.50    Types: Cigarettes  . Smokeless tobacco: Current User  Substance and Sexual Activity  . Alcohol  use: No  . Drug use: Yes    Types: IV    Comment: heroin  . Sexual activity: Yes  Other Topics Concern  . None  Social History Narrative  . None    Sleep: Good  Appetite:  Fair  Current Medications: Current Facility-Administered Medications  Medication Dose Route Frequency Provider Last Rate Last Dose  . 0.9 %  sodium chloride infusion   Intravenous Continuous Regalado, Belkys A, MD 100 mL/hr at 10/07/17 0906    . acetaminophen (TYLENOL) tablet 1,000 mg  1,000 mg Oral Q6H PRN Regalado, Belkys A, MD   1,000 mg at 10/06/17 1751  . alum & mag hydroxide-simeth (MAALOX/MYLANTA) 200-200-20 MG/5ML suspension 30 mL  30 mL Oral Q6H PRN Regalado, Belkys A, MD   30 mL at 09/15/17 7628  . buprenorphine-naloxone (SUBOXONE) 8-2 mg per SL tablet 1 tablet  1 tablet Sublingual BID Axel Filler, MD   1 tablet at 10/07/17 0908  . ceFAZolin (ANCEF) IVPB 2g/100 mL premix  2 g Intravenous Q8H Carlyle Basques, MD 200 mL/hr at 10/07/17 0629 2 g at 10/07/17 0629  . Chlorhexidine Gluconate Cloth 2 % PADS 6 each  6 each Topical Daily Regalado, Belkys A, MD   6 each at 10/07/17 0908  . clonazePAM (KLONOPIN) tablet 0.5 mg  0.5 mg Oral BID Gardiner Barefoot, NP   0.5 mg at 10/07/17 0907  . [START ON 10/08/2017] enoxaparin (LOVENOX) injection 90 mg  90 mg Subcutaneous Q24H Reginia Naas, RPH      . famotidine (PEPCID) tablet 20 mg  20 mg Oral Daily Regalado, Belkys A, MD   20 mg at 10/07/17 0908  . feeding supplement (ENSURE ENLIVE) (ENSURE ENLIVE) liquid 237 mL  237 mL Oral TID BM Alma Friendly, MD   237 mL at 10/06/17 2141  . ferrous sulfate tablet 325 mg  325 mg Oral BID WC Regalado, Belkys A, MD   325 mg at 10/07/17 0908  . hydrOXYzine (ATARAX/VISTARIL) tablet 25 mg  25 mg Oral TID PRN Regalado, Belkys A, MD   25 mg at 10/05/17 0855  . lactated ringers infusion   Intravenous Continuous Montez Hageman, MD 10 mL/hr at 09/23/17 1329    . lactated ringers infusion   Intravenous Continuous  Belinda Block, MD 10 mL/hr at 10/04/17 1237    . multivitamin with minerals tablet 1 tablet  1 tablet Oral Daily Regalado, Belkys A, MD   1 tablet at 10/07/17 0908  . mupirocin ointment (BACTROBAN) 2 % 1 application  1 application Nasal BID Regalado, Belkys A, MD   1 application at 31/51/76 0908  . nicotine (NICODERM CQ - dosed in mg/24 hours) patch 14 mg  14 mg Transdermal Daily Crosley, Debby, MD   14 mg at 09/24/17 1224  . ondansetron (ZOFRAN) tablet 4 mg  4 mg Oral Q6H PRN Quintella Baton, MD       Or  . ondansetron (ZOFRAN) injection  4 mg  4 mg Intravenous Q6H PRN Quintella Baton, MD   4 mg at 10/01/17 1122  . oxyCODONE (Oxy IR/ROXICODONE) immediate release tablet 5 mg  5 mg Oral Q6H PRN Axel Filler, MD   5 mg at 10/06/17 2141  . polyethylene glycol (MIRALAX / GLYCOLAX) packet 17 g  17 g Oral Daily PRN Crosley, Debby, MD      . potassium chloride SA (K-DUR,KLOR-CON) CR tablet 40 mEq  40 mEq Oral BID Regalado, Belkys A, MD   40 mEq at 10/06/17 2141  . sertraline (ZOLOFT) tablet 50 mg  50 mg Oral Daily Faythe Dingwall, DO   50 mg at 10/07/17 0160  . sodium chloride (OCEAN) 0.65 % nasal spray 1 spray  1 spray Each Nare PRN Blount, Xenia T, NP      . sodium chloride flush (NS) 0.9 % injection 10-40 mL  10-40 mL Intracatheter PRN Alma Friendly, MD   20 mL at 09/30/17 0421  . traZODone (DESYREL) tablet 50 mg  50 mg Oral QHS PRN Faythe Dingwall, DO   50 mg at 10/06/17 2141  . Vitamin D (Ergocalciferol) (DRISDOL) capsule 50,000 Units  50,000 Units Oral Q Mon Regalado, Belkys A, MD   50,000 Units at 10/03/17 1617    Lab Results:  Results for orders placed or performed during the hospital encounter of 09/11/17 (from the past 48 hour(s))  CBC with Differential/Platelet     Status: Abnormal   Collection Time: 10/06/17  4:05 AM  Result Value Ref Range   WBC 12.3 (H) 4.0 - 10.5 K/uL   RBC 2.91 (L) 4.22 - 5.81 MIL/uL   Hemoglobin 8.8 (L) 13.0 - 17.0 g/dL   HCT 27.3 (L) 39.0  - 52.0 %   MCV 93.8 78.0 - 100.0 fL   MCH 30.2 26.0 - 34.0 pg   MCHC 32.2 30.0 - 36.0 g/dL   RDW 12.9 11.5 - 15.5 %   Platelets 281 150 - 400 K/uL   Neutrophils Relative % 69 %   Neutro Abs 8.5 (H) 1.7 - 7.7 K/uL   Lymphocytes Relative 20 %   Lymphs Abs 2.4 0.7 - 4.0 K/uL   Monocytes Relative 9 %   Monocytes Absolute 1.2 (H) 0.1 - 1.0 K/uL   Eosinophils Relative 2 %   Eosinophils Absolute 0.3 0.0 - 0.7 K/uL   Basophils Relative 0 %   Basophils Absolute 0.0 0.0 - 0.1 K/uL    Comment: Performed at [facility], [address] [place], [place] [address]  Basic metabolic panel     Status: Abnormal   Collection Time: 10/06/17  4:05 AM  Result Value Ref Range   Sodium 130 (L) 135 - 145 mmol/L   Potassium 4.1 3.5 - 5.1 mmol/L   Chloride 94 (L) 101 - 111 mmol/L   CO2 27 22 - 32 mmol/L   Glucose, Bld 103 (H) 65 - 99 mg/dL   BUN 7 6 - 20 mg/dL   Creatinine, Ser 0.55 (L) 0.61 - 1.24 mg/dL   Calcium 7.8 (L) 8.9 - 10.3 mg/dL   GFR calc non Af Amer >60 >60 mL/min   GFR calc Af Amer >60 >60 mL/min    Comment: (NOTE) The eGFR has been calculated using the CKD EPI equation. This calculation has not been validated in all clinical situations. eGFR's persistently <60 mL/min signify possible Chronic Kidney Disease.    Anion gap 9 5 - 15    Comment: Performed at [facility], [place] [address], [place], [place] [address]  Urinalysis, Routine w reflex microscopic     Status: Abnormal   Collection Time: 10/06/17  5:46 PM  Result Value Ref Range   Color, Urine YELLOW YELLOW   APPearance CLEAR CLEAR   Specific Gravity, Urine 1.017 1.005 - 1.030   pH 7.0 5.0 - 8.0   Glucose, UA NEGATIVE NEGATIVE mg/dL   Hgb urine dipstick MODERATE (A) NEGATIVE   Bilirubin Urine NEGATIVE NEGATIVE   Ketones, ur NEGATIVE NEGATIVE mg/dL   Protein, ur NEGATIVE NEGATIVE mg/dL   Nitrite NEGATIVE NEGATIVE   Leukocytes, UA NEGATIVE NEGATIVE   RBC / HPF TOO NUMEROUS TO COUNT 0 - 5 RBC/hpf   WBC, UA 0-5 0 -  5 WBC/hpf   Bacteria, UA NONE SEEN NONE SEEN   Squamous Epithelial / LPF 0-5 (A) NONE SEEN   Mucus PRESENT     Comment: Performed at [facility], [place] [address] [place], [place] [address]  CBC with Differential/Platelet     Status: Abnormal   Collection Time: 10/07/17  4:54 AM  Result Value Ref Range   WBC 8.4 4.0 - 10.5 K/uL   RBC 2.86 (L) 4.22 - 5.81 MIL/uL   Hemoglobin 8.9 (L) 13.0 - 17.0 g/dL   HCT 27.0 (L) 39.0 - 52.0 %   MCV 94.4 78.0 - 100.0 fL   MCH 31.1 26.0 - 34.0 pg   MCHC 33.0 30.0 - 36.0 g/dL   RDW 13.2 11.5 - 15.5 %   Platelets 257 150 - 400 K/uL   Neutrophils Relative % 69 %   Neutro Abs 5.8 1.7 - 7.7 K/uL   Lymphocytes Relative 21 %   Lymphs Abs 1.8 0.7 - 4.0 K/uL   Monocytes Relative 7 %   Monocytes Absolute 0.6 0.1 - 1.0 K/uL   Eosinophils Relative 3 %   Eosinophils Absolute 0.2 0.0 - 0.7 K/uL   Basophils Relative 0 %   Basophils Absolute 0.0 0.0 - 0.1 K/uL    Comment: Performed at [facility] [address], [place], [place] [address]  Basic metabolic panel     Status: Abnormal   Collection Time: 10/07/17  4:54 AM  Result Value Ref Range   Sodium 137 135 - 145 mmol/L    Comment: DELTA CHECK NOTED   Potassium 3.8 3.5 - 5.1 mmol/L   Chloride 100 (L) 101 - 111 mmol/L   CO2 28 22 - 32 mmol/L   Glucose, Bld 107 (H) 65 - 99 mg/dL   BUN 8 6 - 20 mg/dL   Creatinine, Ser 0.51 (L) 0.61 - 1.24 mg/dL   Calcium 8.3 (L) 8.9 - 10.3 mg/dL   GFR calc non Af Amer >60 >60 mL/min   GFR calc Af Amer >60 >60 mL/min    Comment: (NOTE) The eGFR has been calculated using the CKD EPI equation. This calculation has not been validated in all clinical situations. eGFR's persistently <60 mL/min signify possible Chronic Kidney Disease.    Anion gap 9 5 - 15    Comment: Performed at [facility] [address], [place],  [address]    Blood Alcohol level:  Lab Results  Component Value Date   [facility] <11 01/05/2014   ETH <11 12/27/2013     Musculoskeletal: Strength & Muscle Tone: within normal limits Gait & Station: UTA since patient was lying in bed. Patient leans: N/A  Psychiatric Specialty Exam: Physical Exam  Nursing note and vitals  reviewed. Constitutional: He is oriented to person, place, and time. He appears well-developed and well-nourished.  HENT:  Head: Normocephalic and atraumatic.  Neck: Normal range of motion.  Respiratory: Effort normal.  Musculoskeletal: Normal range of motion.  Neurological: He is alert and oriented to person, place, and time.  Skin: No rash noted.  Psychiatric: He has a normal mood and affect. His speech is normal and behavior is normal. Thought content normal. Cognition and memory are normal. He expresses impulsivity.    Review of Systems  Constitutional: Negative for chills and fever.  Respiratory: Positive for cough.   Cardiovascular: Positive for chest pain.  Gastrointestinal: Negative for abdominal pain, constipation, diarrhea, nausea and vomiting.  Psychiatric/Behavioral: Positive for depression and substance abuse. Negative for hallucinations and suicidal ideas. The patient is nervous/anxious. The patient does not have insomnia.   All other systems reviewed and are negative.   Blood pressure 108/66, pulse (!) 113, temperature 98.5 F (36.9 C), temperature source Oral, resp. rate 18, height '5\' 9"'  (1.753 m), weight 59.6 kg (131 lb 6.3 oz), SpO2 97 %.Body mass index is 19.4 kg/m.  General Appearance: Fairly Groomed, young, Caucasian male with multiple body tattoos, wearing a hospital gown and lying in bed. NAD.   Eye Contact:  Good  Speech:  Clear and Coherent and Normal Rate  Volume:  Normal  Mood:  Anxious  Affect:  Constricted  Thought Process:  Goal Directed, Linear and Descriptions of Associations: Intact  Orientation:  Full (Time, Place, and Person)  Thought Content:  Logical  Suicidal Thoughts:  No  Homicidal Thoughts:  No  Memory:  Immediate;   Good Recent;    Good Remote;   Good  Judgement:  Fair  Insight:  Fair  Psychomotor Activity:  Normal  Concentration:  Concentration: Good and Attention Span: Good  Recall:  Good  Fund of Knowledge:  Good  Language:  Good  Akathisia:  No  Handed:  Right  AIMS (if indicated):   N/A  Assets:  Communication Skills Housing Social Support  ADL's:  Intact  Cognition:  WNL  Sleep:   Okay   Assessment:  Gary Hicks Life is a 32 y.o. male who was admitted with MSSA bacteremia and metastatic infection including tricuspid valve endocarditis, septic left knee bursitis and septic thrombophlebitis of the saphenous vein. Patient reports no improvement in mood or anxiety since starting Zoloft. He declines increasing medication at this time and appears to be more concerned with continuing to receive Klonopin and Suboxone. Recommend increasing Zoloft for mood and anxiety and tapering Klonopin since it is not recommended for long term use for anxiety and in the setting of substance abuse.    Treatment Plan Summary: -Recommend increasing Zoloft 50 mg daily to 75 mg daily to better manage anxiety and depression. Can increase to 100 mg daily after a week if patient tolerates increased dose. -Decrease Klonopin 0.5 mg BID PRN to 0.5 mg daily PRN since it is not indicated for chronic use of anxiety and in the setting of substance abuse.  -Continue Trazodone 50 mg qhs PRN for insomnia. -Psychiatry will sign off on patient at this time. Please consult psychiatry again as needed for further medication adjustments.   Faythe Dingwall, DO 10/07/2017, 12:46 PM

## 2017-10-07 NOTE — Progress Notes (Signed)
Dr. Horris Latino was paged regarding psych consult, she said she is going to consult them.

## 2017-10-07 NOTE — Progress Notes (Signed)
Physical Therapy Treatment Patient Details Name: Gary Hicks MRN: [id number] DOB: 1985/09/10 Today's Date: 10/07/2017    History of Present Illness Pt is a 32 y/o male with active IV drug use presenting with generalized pain. Family is concerned about seizures due to finding him in fetal position with abnormal eye movements. He reports global polyarthralgia, use of heroin and methamphetamines in the past 24 hours at time of admission. Diagnosed with staph bacteremia, severe sepsis. PMH bacterial endocarditis, heroin abuse, possible Hepatitis C, opiate abuse, polysubstance abuse, seizures.  Pt also with positive right LE DVT on 2/18.  On Lovenox.  Pt is s/p I & D of bilat hips on 3/5.     PT Comments    Pt with slow progression towards goals. Pt limited in mobility tolerance secondary to pain and required max encouragement to participate. Feel pt is self limiting as well. Able to take side steps at EOB with min guard A and RW. Updated goals as well. Current recommendations appropriate. Will continue to follow acutely to maximize functional mobility independence and safety.    Follow Up Recommendations  SNF;Other (comment)(pending mobility progress )     Equipment Recommendations  Rolling walker with 5" wheels    Recommendations for Other Services       Precautions / Restrictions Precautions Precautions: Fall Restrictions Weight Bearing Restrictions: Yes RLE Weight Bearing: Weight bearing as tolerated LLE Weight Bearing: Weight bearing as tolerated    Mobility  Bed Mobility Overal bed mobility: Needs Assistance Bed Mobility: Supine to Sit;Sit to Supine     Supine to sit: Min assist;HOB elevated Sit to supine: Min assist;HOB elevated   General bed mobility comments: Min A for LE management to come to EOB and to return to supine. Extremely slow secondary to pain.   Transfers Overall transfer level: Needs assistance Equipment used: Rolling walker (2 wheeled) Transfers: Sit  to/from Stand Sit to Stand: Min guard;From elevated surface         General transfer comment: Min guard for safety from elevated bed surface. Increased time required.   Ambulation/Gait Ambulation/Gait assistance: Min guard   Assistive device: Rolling walker (2 wheeled)   Gait velocity: very slow  Gait velocity interpretation: Below normal speed for age/gender General Gait Details: Able to perform side steps at EOB, however, further mobility limited secondary to pt reports of pain. Pt refusing further mobility. Min guard for safety.    Stairs            Wheelchair Mobility    Modified Rankin (Stroke Patients Only)       Balance Overall balance assessment: Needs assistance Sitting-balance support: Feet supported;Bilateral upper extremity supported Sitting balance-Leahy Scale: Fair     Standing balance support: Bilateral upper extremity supported Standing balance-Leahy Scale: Poor Standing balance comment: Reliant on BUE support.                             Cognition Arousal/Alertness: Awake/alert Behavior During Therapy: WFL for tasks assessed/performed Overall Cognitive Status: Within Functional Limits for tasks assessed                                        Exercises Total Joint Exercises Ankle Circles/Pumps: AROM;Both;10 reps;Supine Other Exercises Other Exercises: Encouraged ROM exercises while in bed to help with decreasing hip pain.     General Comments  Pertinent Vitals/Pain Pain Assessment: Faces Faces Pain Scale: Hurts whole lot Pain Location: bilateral hips and knees  Pain Descriptors / Indicators: Grimacing;Guarding;Aching;Constant Pain Intervention(s): Limited activity within patient's tolerance;Monitored during session;Repositioned    Home Living                      Prior Function            PT Goals (current goals can now be found in the care plan section) Acute Rehab PT Goals Patient  Stated Goal: none stated  PT Goal Formulation: With patient/family Time For Goal Achievement: 10/21/17 Potential to Achieve Goals: Fair Progress towards PT goals: Progressing toward goals    Frequency    Min 3X/week      PT Plan Current plan remains appropriate    Co-evaluation              AM-PAC PT "6 Clicks" Daily Activity  Outcome Measure  Difficulty turning over in bed (including adjusting bedclothes, sheets and blankets)?: A Little Difficulty moving from lying on back to sitting on the side of the bed? : Unable Difficulty sitting down on and standing up from a chair with arms (e.g., wheelchair, bedside commode, etc,.)?: Unable Help needed moving to and from a bed to chair (including a wheelchair)?: A Little Help needed walking in hospital room?: A Lot Help needed climbing 3-5 steps with a railing? : Total 6 Click Score: 11    End of Session Equipment Utilized During Treatment: Gait belt Activity Tolerance: Patient limited by pain Patient left: with call bell/phone within reach;in bed;with bed alarm set Nurse Communication: Mobility status PT Visit Diagnosis: Muscle weakness (generalized) (M62.81);Difficulty in walking, not elsewhere classified (R26.2);Pain Pain - Right/Left: (bilat ) Pain - part of body: Hip     Time: 1352-1415 PT Time Calculation (min) (ACUTE ONLY): 23 min  Charges:  $Therapeutic Activity: 23-37 mins                    G Codes:       Leighton Ruff, PT, DPT  Acute Rehabilitation Services  Pager: [phone]    Rudean Hitt 10/07/2017, 2:29 PM

## 2017-10-07 NOTE — Clinical Social Work Note (Signed)
CSW talked by phone with patient's mother, regarding her son, his current medical, and emotional status and contact with his wife. Mrs. Fontes voiced awareness of why his son's wife, Kathlee Nations was banned from visiting with her husband, however she expressed concern regarding her son's depression that she attributes to not being able to see his wife or his 32-year old daughter.  Mrs. Dehaan has talked with [facility] director and is hoping to hear from her with positive news regarding Kathlee Nations being able to visit. She brought up as options, supervised visit by hospital staff or she or her husband.  Mrs. Kilian provided a brief history of her son's drug use and being incarcerated, but she is very is also very optimistic regarding her son's recovery from drugs and feels that he no longer wants to use illicit drugs. As a mother, Mrs. Duve continued to express her concern regarding her son's mental well-being and her hope that the hospital will do what we can to assist her son with this request. CSW actively listened and expressed empathic understanding of Mrs. Catterton concerns. CSW informed mother, that follow-up will be made with unit director.  CSW will follow-up with unit director and continue to provide SW intervention services as needed through discharge.  Gary Hicks, MSW, LCSW Licensed Clinical Social Worker [place] [phone]

## 2017-10-07 NOTE — Progress Notes (Addendum)
Patient scheduled for 40 meq Kdur. Patient refused. Educated patient about importance of potassium. Patient continued to decline. Will continue to monitor.  Patient declined bedtime vital signs.   Patient also declined laxative.   Bartholomew Crews, RN

## 2017-10-08 LAB — CBC WITH DIFFERENTIAL/PLATELET
Basophils Absolute: 0 10*3/uL (ref 0.0–0.1)
Basophils Relative: 0 %
EOS PCT: 2 %
Eosinophils Absolute: 0.2 10*3/uL (ref 0.0–0.7)
HCT: 25.8 % — ABNORMAL LOW (ref 39.0–52.0)
Hemoglobin: 8.5 g/dL — ABNORMAL LOW (ref 13.0–17.0)
LYMPHS ABS: 2.1 10*3/uL (ref 0.7–4.0)
LYMPHS PCT: 24 %
MCH: 31 pg (ref 26.0–34.0)
MCHC: 32.9 g/dL (ref 30.0–36.0)
MCV: 94.2 fL (ref 78.0–100.0)
Monocytes Absolute: 0.6 10*3/uL (ref 0.1–1.0)
Monocytes Relative: 6 %
Neutro Abs: 6 10*3/uL (ref 1.7–7.7)
Neutrophils Relative %: 68 %
PLATELETS: 286 10*3/uL (ref 150–400)
RBC: 2.74 MIL/uL — AB (ref 4.22–5.81)
RDW: 13.1 % (ref 11.5–15.5)
WBC: 8.9 10*3/uL (ref 4.0–10.5)

## 2017-10-08 LAB — BASIC METABOLIC PANEL
Anion gap: 9 (ref 5–15)
BUN: 5 mg/dL — AB (ref 6–20)
CO2: 30 mmol/L (ref 22–32)
Calcium: 8.3 mg/dL — ABNORMAL LOW (ref 8.9–10.3)
Chloride: 96 mmol/L — ABNORMAL LOW (ref 101–111)
Creatinine, Ser: 0.48 mg/dL — ABNORMAL LOW (ref 0.61–1.24)
GFR calc Af Amer: >60 mL/min (ref >60)
GFR calc non Af Amer: >60 mL/min (ref >60)
GLUCOSE: 103 mg/dL — AB (ref 65–99)
POTASSIUM: 3.9 mmol/L (ref 3.5–5.1)
Sodium: 135 mmol/L (ref 135–145)

## 2017-10-08 LAB — URINE CULTURE: Culture: <10000 — AB

## 2017-10-08 MED ORDER — CLONAZEPAM 0.5 MG PO TABS
0.5000 mg | ORAL_TABLET | Freq: Every day | ORAL | Status: DC | PRN
  Administered 2017-10-08 – 2017-11-05 (×28): 0.5 mg via ORAL
  Filled 2017-10-08 (×29): qty 1

## 2017-10-08 MED ORDER — SERTRALINE HCL 50 MG PO TABS
50.0000 mg | ORAL_TABLET | Freq: Every day | ORAL | Status: DC
  Administered 2017-10-09 – 2017-11-05 (×28): 50 mg via ORAL
  Filled 2017-10-08 (×28): qty 1

## 2017-10-08 MED ORDER — SENNOSIDES-DOCUSATE SODIUM 8.6-50 MG PO TABS
1.0000 | ORAL_TABLET | Freq: Two times a day (BID) | ORAL | Status: DC
  Administered 2017-10-10 – 2017-11-05 (×36): 1 via ORAL
  Filled 2017-10-08 (×51): qty 1

## 2017-10-08 MED ORDER — POLYETHYLENE GLYCOL 3350 17 G PO PACK
17.0000 g | PACK | Freq: Two times a day (BID) | ORAL | Status: DC
  Administered 2017-10-14 – 2017-11-02 (×6): 17 g via ORAL
  Filled 2017-10-08 (×26): qty 1

## 2017-10-08 MED ORDER — SERTRALINE HCL 50 MG PO TABS: 75.0000 mg | ORAL_TABLET | Freq: Every day | ORAL | Status: DC

## 2017-10-08 NOTE — Progress Notes (Signed)
PROGRESS NOTE  Marquinn Meschke Sylvan NFA:[id number] DOB: 1986/04/27 DOA: 09/11/2017 PCP: Patient, [name] Per  HPI/Recap of past 83 hours: 32 year old male, active IVDA, who presents with generalized pain. Patient was incarcerated and released 3-4 weeks ago. Patient admits using IV methamphetamine as well as heroin in the past 24 hours prior to admission. Reports global polyarthralgias including his bilateral feet, bilateral knees, bilateral hips and shoulders. Also reports fevers and chills generalized myalgias. Pt admitted for further management. 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 Pt was also diagnosed with DVT Right gastrocnemius vein . He was started on Lovenox 2/18. He was also diagnosed with left knee septic infection. He underwent bursectomy and I and D by Dr Percell Miller 2-25. He reported worsening left hip pain 3-02, MRI showed, B/L hip Septic arthritis and 13 mm fluid collection in the posterior aspect of the left obturator internus muscle most concerning for a small abscess. IR was consulted for arthrocentesis, patient couldn't tolerate procedure. Dr Percell Miller performed I and D of bilateral hip on 3/5.  Today, pt still reporting anxiety/depression stating that "klonipin is the only med that helps" Discussed with pt and mother at bedside. Pt very upset that klonipin was decreased to daily prn. Pt refused the option of increasing zoloft. Mother wants to speak with patient advocate, concerning pt wife visiting him. Discussed extensively with pt and mother about overall management. Denies any chest pain, SOB, N/V, fever/chills, abdominal pain.   Assessment/Plan: Principal Problem:   Anxiety Active Problems:   Heroin abuse (HCC)   Polysubstance abuse (HCC)   Severe sepsis ([place])   Bacteremia due to Staphylococcus aureus   Cigarette smoker   Hypokalemia   DIC (disseminated intravascular coagulation) (HCC)   Staphylococcal arthritis of right shoulder (HCC)   Septic infrapatellar bursitis of left knee   Staphylococcal arthritis of left hip (HCC)   Staphylococcal arthritis of right hip (HCC)   Endocarditis of tricuspid valve   Calf abscess   Pain   Pyomyositis   Saphenous vein thrombophlebitis, right  Tricuspid valve endocarditis with MSSA bacteremia Afebrile with resolved leukocytosis Repeat BC x2 NGTD, CXR no PNA, U/A neg. UC <10,000 colonies Blood culture 2/11 grew MSSA, repeat on 2/15 NGTD ECHO showed: Tricuspid valve: There was a large, 1.1 cm (W) x 1.8 cm (L), multilobulated, highly mobile vegetation on the right ventricular aspect of the anterior leaflet. 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  -S/P I&D and bursectomy of L Knee prepatellar bursitis on 09/23/17 -S/P I&D of septic arthritis bilateral hip on 10/04/17 - Cultures from I&D, NGTD -MRI left knee: 4.8 x 0.8 x 4 cm fluid collection in subcutaneous fat anterior to patellar tendon with severe surrounding soft tissue edema concerning for septic bursitis -MRI Right shoulder: Mild supraspinatus and infraspinatus muscle edema suggestive of Myositis -MRI brain negative for abscess -MRI C spine: Motion degraded exam. C2-3 through C7-T1 no bulge, stenosis or foraminal narrowing. Mild C3-4 through C5-6 facet arthropathy  -Doppler left arm: Negative for DVT -MRI hip showed septic arthritis and 13 mm fluid collection in the posterior aspect of the left obturator internus muscle most concerning for a small abscess -Ortho on board  Malnutrition; report poor appetite. Ensure.  Nutrition consulted.   IVDA  Dr Damita Dunnings managing Suboxone BID  No high dose IV benzo unless required for seizure  GAD Psych consulted: start zoloft 50 mg daily May  continue Klonopin prn for 2 weeks and wean off, not advised for long term.  Re-consulted Psych for follow up: Rec increasing zoloft, and tapering off klonipin, pt very upset about plan, doesn't want zoloft increased  Seizures Likely related to drug use Seizure precautions PRN Ativan MRI negative  Iron def anemia Continue POiron Hb stable  DVT right Gastrocnemius vein Also superficial saphenous thrombophlebitis.  Restart lovenox, ortho ok for AC  Thrombocytopenia Resolved  Hyponatremia Resolved  Daily BMP   Hepatitis c antibody positive ID on board  Hypokalemia; resolved.   Vitamin D deficiency; started on supplementation.      Code Status: Full  Family Communication: Spoke to mother   Disposition Plan: Until IV AB is completed   Consultants:  ID  Ortho  Procedures:  I&D of L knee on 09/23/17  I &D of bilateral hip on 10/04/17  Antimicrobials:  IV Ancef   DVT prophylaxis:  Therapeutic lovenox   Objective: Vitals:   10/07/17 0914 10/07/17 1700 10/08/17 0514 10/08/17 0854  BP: 108/66 105/70 96/61 101/67  Pulse: (!) 113 (!) 111 (!) 101 (!) 102  Resp: 18 18 16 16   Temp: 98.5 F (36.9 C) 98.7 F (37.1 C) 98.3 F (36.8 C) 98.5 F (36.9 C)  TempSrc: Oral Oral Oral Oral  SpO2: 97% 95% 96% 96%  Weight:      Height:        Intake/Output Summary (Last 24 hours) at 10/08/2017 1615 Last data filed at 10/08/2017 0556 Gross per 24 hour  Intake 2381.66 ml  Output 2500 ml  Net -118.34 ml   Filed Weights   09/23/17 1328 10/05/17 2052 10/06/17 2218  Weight: 74 kg (163 lb 2.3 oz) 59 kg (130 lb 1.1 oz) 59.6 kg (131 lb 6.3 oz)    Exam:   General:  NAD  Cardiovascular: S1, S2 RRR  Respiratory: CTA  Abdomen: Soft, NT, ND, BS+   Musculoskeletal: L knee with dressing   Skin: Normal, no rash  Psychiatry: Flat affect   Data Reviewed: CBC: Recent Labs  Lab 10/03/17 0427 10/04/17 0306 10/06/17 0405 10/07/17 0454 10/08/17 0452    WBC 8.9 9.1 12.3* 8.4 8.9  NEUTROABS  --   --  8.5* 5.8 6.0  HGB 9.2* 9.2* 8.8* 8.9* 8.5*  HCT 28.3* 28.2* 27.3* 27.0* 25.8*  MCV 95.3 94.6 93.8 94.4 94.2  PLT 268 272 281 257 417   Basic Metabolic Panel: Recent Labs  Lab 10/03/17 0427 10/06/17 0405 10/07/17 0454 10/08/17 0452  NA 133* 130* 137 135  K 4.0 4.1 3.8 3.9  CL 96* 94* 100* 96*  CO2 29 27 28 30   GLUCOSE 113* 103* 107* 103*  BUN 7 7 8  5*  CREATININE 0.61 0.55* 0.51* 0.48*  CALCIUM 8.6* 7.8* 8.3* 8.3*   GFR: Estimated Creatinine Clearance: 112.8 mL/min (A) (by C-G formula based on SCr of 0.48 mg/dL (L)). Liver Function Tests: No results for input(s): AST, ALT, ALKPHOS, BILITOT, PROT, ALBUMIN in the last 168 hours. No results for input(s): LIPASE, AMYLASE in the last 168 hours. No results for input(s): AMMONIA in the last 168 hours. Coagulation Profile: Recent Labs  Lab 10/03/17 0905  INR 1.27   Cardiac Enzymes: No results for input(s): CKTOTAL, CKMB, CKMBINDEX, TROPONINI in the last 168 hours. BNP (last 3 results) No results  for input(s): PROBNP in the last 8760 hours. HbA1C: No results for input(s): HGBA1C in the last 72 hours. CBG: No results for input(s): GLUCAP in the last 168 hours. Lipid Profile: No results for input(s): CHOL, HDL, LDLCALC, TRIG, CHOLHDL, LDLDIRECT in the last 72 hours. Thyroid Function Tests: No results for input(s): TSH, T4TOTAL, FREET4, T3FREE, THYROIDAB in the last 72 hours. Anemia Panel: No results for input(s): VITAMINB12, FOLATE, FERRITIN, TIBC, IRON, RETICCTPCT in the last 72 hours. Urine analysis:    Component Value Date/Time   COLORURINE YELLOW 10/06/2017 1746   APPEARANCEUR CLEAR 10/06/2017 1746   LABSPEC 1.017 10/06/2017 1746   PHURINE 7.0 10/06/2017 1746   GLUCOSEU NEGATIVE 10/06/2017 1746   HGBUR MODERATE (A) 10/06/2017 1746   BILIRUBINUR NEGATIVE 10/06/2017 1746   KETONESUR NEGATIVE 10/06/2017 1746   PROTEINUR NEGATIVE 10/06/2017 1746   UROBILINOGEN 0.2  05/16/2012 0847   NITRITE NEGATIVE 10/06/2017 1746   LEUKOCYTESUR NEGATIVE 10/06/2017 1746   Sepsis Labs: @LABRCNTIP (procalcitonin:4,lacticidven:4)  ) Recent Results (from the past 240 hour(s))  Urine Culture     Status: Abnormal   Collection Time: 10/06/17  5:46 PM  Result Value Ref Range Status   Specimen Description URINE, CLEAN CATCH  Final   Special Requests NONE  Final   Culture (A)  Final    <10,000 COLONIES/mL Performed at [facility], [place] [address], [place], [place] [address]    Report Status 10/08/2017 FINAL  Final  Culture, blood (routine x 2)     Status: None (Preliminary result)   Collection Time: 10/06/17  8:46 PM  Result Value Ref Range Status   Specimen Description BLOOD LEFT FOREARM  Final   Special Requests IN PEDIATRIC BOTTLE Blood Culture adequate volume  Final   Culture   Final    NO GROWTH 2 DAYS Performed at [facility], [place] [address] [place], [place] [address]    Report Status PENDING  Incomplete  Culture, blood (routine x 2)     Status: None (Preliminary result)   Collection Time: 10/06/17  8:58 PM  Result Value Ref Range Status   Specimen Description BLOOD LEFT FOREARM  Final   Special Requests IN PEDIATRIC BOTTLE Blood Culture adequate volume  Final   Culture   Final    NO GROWTH 2 DAYS Performed at [facility], [place] [address], [place], [place] [address]    Report Status PENDING  Incomplete      Studies: No results found.  Scheduled Meds: . buprenorphine-naloxone  1 tablet Sublingual BID  . enoxaparin (LOVENOX) injection  90 mg Subcutaneous Q24H  . famotidine  20 mg Oral Daily  . feeding supplement (ENSURE ENLIVE)  237 mL Oral TID BM  . ferrous sulfate  325 mg Oral BID WC  . multivitamin with minerals  1 tablet Oral Daily  . mupirocin ointment  1 application Nasal BID  . nicotine  14 mg Transdermal Daily  . polyethylene glycol  17 g Oral BID  . senna-docusate  1 tablet Oral BID  . [START ON 10/09/2017]  sertraline  50 mg Oral Daily  . Vitamin D (Ergocalciferol)  50,000 Units Oral Q Mon    Continuous Infusions: .  ceFAZolin (ANCEF) IV Stopped (10/08/17 1415)  . lactated ringers 10 mL/hr at 09/23/17 1329  . lactated ringers 10 mL/hr at 10/04/17 1237     LOS: 27 days     Alma Friendly, MD [facility]   If 7PM-7AM, please contact night-coverage [email or web address] Password  [email or web address] 10/08/2017, 4:15 PM

## 2017-10-08 NOTE — Progress Notes (Signed)
Paged Dr. Johnette Abraham.  Pt is upset over [facility] [place] only oreded QHS.  Stated it is BID, but not ordered that way.  FYI.

## 2017-10-09 NOTE — Progress Notes (Signed)
PROGRESS NOTE  Gary Hicks FYB:[id number] DOB: [birth year] DOA: 09/11/2017 PCP: Patient, [name] Per  HPI/Recap of past 69 hours: 32 year old male, active IVDA, who presents with generalized pain. Patient was incarcerated and released 3-4 weeks ago. Patient admits using IV methamphetamine as well as heroin in the past 24 hours prior to admission. Reports global polyarthralgias including his bilateral feet, bilateral knees, bilateral hips and shoulders. Also reports fevers and chills generalized myalgias. Pt admitted for further management. 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 Pt was also diagnosed with DVT Right gastrocnemius vein . He was started on Lovenox 2/18. He was also diagnosed with left knee septic infection. He underwent bursectomy and I and D by Dr Percell Miller 2-25. He reported worsening left hip pain 3-02, MRI showed, B/L hip Septic arthritis and 13 mm fluid collection in the posterior aspect of the left obturator internus muscle most concerning for a small abscess. IR was consulted for arthrocentesis, patient couldn't tolerate procedure. Dr Percell Miller performed I and D of bilateral hip on 3/5.  Today, pt denies any new complaints, wants to take a shower, doesn't want zoloft increased. Denies any chest pain, SOB, N/V, fever/chills, abdominal pain.   Assessment/Plan: Principal Problem:   Anxiety Active Problems:   Heroin abuse (HCC)   Polysubstance abuse (HCC)   Severe sepsis ([place])   Bacteremia due to Staphylococcus aureus   Cigarette smoker   Hypokalemia   DIC (disseminated intravascular coagulation) (HCC)   Staphylococcal arthritis of right shoulder (HCC)   Septic infrapatellar bursitis of left knee   Staphylococcal arthritis of left hip (HCC)   Staphylococcal arthritis of right hip (HCC)   Endocarditis of tricuspid valve   Calf abscess   Pain   Pyomyositis   Saphenous vein thrombophlebitis, right  Tricuspid valve endocarditis with  MSSA bacteremia Afebrile with resolved leukocytosis Repeat BC x2 NGTD, CXR no PNA, U/A neg. UC <10,000 colonies Blood culture 2/11 grew MSSA, repeat on 2/15 NGTD ECHO showed: Tricuspid valve: There was a large, 1.1 cm (W) x 1.8 cm (L), multilobulated, highly mobile vegetation on the right ventricular aspect of the anterior leaflet. 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  -S/P I&D and bursectomy of L Knee prepatellar bursitis on 09/23/17 -S/P I&D of septic arthritis bilateral hip on 10/04/17 - Cultures from I&D, NGTD -MRI left knee: 4.8 x 0.8 x 4 cm fluid collection in subcutaneous fat anterior to patellar tendon with severe surrounding soft tissue edema concerning for septic bursitis -MRI Right shoulder: Mild supraspinatus and infraspinatus muscle edema suggestive of Myositis -MRI brain negative for abscess -MRI C spine: Motion degraded exam. C2-3 through C7-T1 no bulge, stenosis or foraminal narrowing. Mild C3-4 through C5-6 facet arthropathy  -Doppler left arm: Negative for DVT -MRI hip showed septic arthritis and 13 mm fluid collection in the posterior aspect of the left obturator internus muscle most concerning for a small abscess -Ortho on board  Malnutrition; report poor appetite. Ensure. Nutrition consulted.   IVDA  Dr Damita Dunnings managing Suboxone BID  No high dose IV benzo unless required for seizure  GAD Psych consulted: start zoloft 50 mg daily May continue Klonopin prn for 2 weeks and wean off, not advised for  long term.  Re-consulted Psych for follow up: Rec increasing zoloft, and tapering off klonipin, pt very upset about plan, doesn't want zoloft increased, worried about side effects, but doesn't want to talk about  it.  Seizures Likely related to drug use Seizure precautions PRN Ativan MRI negative  Iron def anemia Continue POiron Hb stable  DVT right Gastrocnemius vein Also superficial saphenous thrombophlebitis.  Restart lovenox, ortho ok for AC  Thrombocytopenia Resolved  Hyponatremia Resolved  Daily BMP   Hepatitis c antibody positive ID on board  Hypokalemia; resolved.   Vitamin D deficiency; started on supplementation.      Code Status: Full  Family Communication: None at bedside  Disposition Plan: Until IV AB is completed   Consultants:  ID  Ortho  Procedures:  I&D of L knee on 09/23/17  I &D of bilateral hip on 10/04/17  Antimicrobials:  IV Ancef   DVT prophylaxis:  Therapeutic lovenox   Objective: Vitals:   10/08/17 1658 10/08/17 2300 10/09/17 0650 10/09/17 1045  BP: 93/67 96/60 98/71  104/68  Pulse: 100 (!) 115 (!) 113 (!) 115  Resp: 18 18 18 18   Temp: 97.7 F (36.5 C) 98.8 F (37.1 C) 98.7 F (37.1 C) 98.7 F (37.1 C)  TempSrc: Oral Oral Oral Oral  SpO2: 96% 98% 96% 96%  Weight:  60 kg (132 lb 4.4 oz)    Height:        Intake/Output Summary (Last 24 hours) at 10/09/2017 1113 Last data filed at 10/09/2017 0600 Gross per 24 hour  Intake 240 ml  Output 2600 ml  Net -2360 ml   Filed Weights   10/05/17 2052 10/06/17 2218 10/08/17 2300  Weight: 59 kg (130 lb 1.1 oz) 59.6 kg (131 lb 6.3 oz) 60 kg (132 lb 4.4 oz)    Exam:   General:  NAD  Cardiovascular: S1, S2 RRR  Respiratory: CTA  Abdomen: Soft, NT, ND, BS+   Musculoskeletal: L knee with dressing   Skin: Normal, no rash  Psychiatry: Flat affect   Data Reviewed: CBC: Recent Labs  Lab 10/03/17 0427 10/04/17 0306 10/06/17 0405 10/07/17 0454 10/08/17 0452  WBC 8.9 9.1 12.3* 8.4 8.9  NEUTROABS  --   --  8.5* 5.8 6.0  HGB 9.2* 9.2* 8.8* 8.9* 8.5*  HCT 28.3* 28.2* 27.3* 27.0* 25.8*  MCV 95.3 94.6 93.8 94.4 94.2  PLT 268 272 281 257 419   Basic Metabolic  Panel: Recent Labs  Lab 10/03/17 0427 10/06/17 0405 10/07/17 0454 10/08/17 0452  NA 133* 130* 137 135  K 4.0 4.1 3.8 3.9  CL 96* 94* 100* 96*  CO2 29 27 28 30   GLUCOSE 113* 103* 107* 103*  BUN 7 7 8  5*  CREATININE 0.61 0.55* 0.51* 0.48*  CALCIUM 8.6* 7.8* 8.3* 8.3*   GFR: Estimated Creatinine Clearance: 113.5 mL/min (A) (by C-G formula based on SCr of 0.48 mg/dL (L)). Liver Function Tests: No results for input(s): AST, ALT, ALKPHOS, BILITOT, PROT, ALBUMIN in the last 168 hours. No results for input(s): LIPASE, AMYLASE in the last 168 hours. No results for input(s): AMMONIA in the last 168 hours. Coagulation Profile: Recent Labs  Lab 10/03/17 0905  INR 1.27   Cardiac Enzymes: No results for input(s): CKTOTAL, CKMB, CKMBINDEX, TROPONINI in the last 168 hours. BNP (last 3 results) No results for input(s): PROBNP in the last 8760 hours. HbA1C: No results for input(s): HGBA1C in the last 72 hours. CBG: No results for input(s): GLUCAP in the last 168 hours.  Lipid Profile: No results for input(s): CHOL, HDL, LDLCALC, TRIG, CHOLHDL, LDLDIRECT in the last 72 hours. Thyroid Function Tests: No results for input(s): TSH, T4TOTAL, FREET4, T3FREE, THYROIDAB in the last 72 hours. Anemia Panel: No results for input(s): VITAMINB12, FOLATE, FERRITIN, TIBC, IRON, RETICCTPCT in the last 72 hours. Urine analysis:    Component Value Date/Time   COLORURINE YELLOW 10/06/2017 1746   APPEARANCEUR CLEAR 10/06/2017 1746   LABSPEC 1.017 10/06/2017 1746   PHURINE 7.0 10/06/2017 1746   GLUCOSEU NEGATIVE 10/06/2017 1746   HGBUR MODERATE (A) 10/06/2017 1746   BILIRUBINUR NEGATIVE 10/06/2017 1746   KETONESUR NEGATIVE 10/06/2017 1746   PROTEINUR NEGATIVE 10/06/2017 1746   UROBILINOGEN 0.2 05/16/2012 0847   NITRITE NEGATIVE 10/06/2017 1746   LEUKOCYTESUR NEGATIVE 10/06/2017 1746   Sepsis Labs: @LABRCNTIP (procalcitonin:4,lacticidven:4)  ) Recent Results (from the past 240 hour(s))  Urine  Culture     Status: Abnormal   Collection Time: 10/06/17  5:46 PM  Result Value Ref Range Status   Specimen Description URINE, CLEAN CATCH  Final   Special Requests NONE  Final   Culture (A)  Final    <10,000 COLONIES/mL Performed at [facility], [place] [address] [place], [place] [address]    Report Status 10/08/2017 FINAL  Final  Culture, blood (routine x 2)     Status: None (Preliminary result)   Collection Time: 10/06/17  8:46 PM  Result Value Ref Range Status   Specimen Description BLOOD LEFT FOREARM  Final   Special Requests IN PEDIATRIC BOTTLE Blood Culture adequate volume  Final   Culture   Final    NO GROWTH 2 DAYS Performed at [facility], [place] [address] [place], [place] [address]    Report Status PENDING  Incomplete  Culture, blood (routine x 2)     Status: None (Preliminary result)   Collection Time: 10/06/17  8:58 PM  Result Value Ref Range Status   Specimen Description BLOOD LEFT FOREARM  Final   Special Requests IN PEDIATRIC BOTTLE Blood Culture adequate volume  Final   Culture   Final    NO GROWTH 2 DAYS Performed at [facility], [place] [address], [place], [place] [address]    Report Status PENDING  Incomplete      Studies: No results found.  Scheduled Meds: . buprenorphine-naloxone  1 tablet Sublingual BID  . enoxaparin (LOVENOX) injection  90 mg Subcutaneous Q24H  . famotidine  20 mg Oral Daily  . feeding supplement (ENSURE ENLIVE)  237 mL Oral TID BM  . ferrous sulfate  325 mg Oral BID WC  . multivitamin with minerals  1 tablet Oral Daily  . nicotine  14 mg Transdermal Daily  . polyethylene glycol  17 g Oral BID  . senna-docusate  1 tablet Oral BID  . sertraline  50 mg Oral Daily  . Vitamin D (Ergocalciferol)  50,000 Units Oral Q Mon    Continuous Infusions: .  ceFAZolin (ANCEF) IV Stopped (10/09/17 0720)  . lactated ringers 10 mL/hr at 09/23/17 1329  . lactated ringers 10 mL/hr at 10/04/17 1237     LOS: 28 days      Alma Friendly, MD [facility]   If 7PM-7AM, please contact night-coverage [email or web address] Password [email or web address] 10/09/2017, 11:13 AM

## 2017-10-10 NOTE — Progress Notes (Signed)
Physical Therapy Treatment Patient Details Name: Gary Hicks MRN: [id number] DOB: [birth year] Today's Date: 10/10/2017    History of Present Illness Pt is a 32 y/o male with active IV drug use presenting with generalized pain. Family is concerned about seizures due to finding him in fetal position with abnormal eye movements. He reports global polyarthralgia, use of heroin and methamphetamines in the past 24 hours at time of admission. Diagnosed with staph bacteremia, severe sepsis. PMH bacterial endocarditis, heroin abuse, possible Hepatitis C, opiate abuse, polysubstance abuse, seizures.  Pt also with positive right LE DVT on 2/18.  On Lovenox.  Pt is s/p I & D of bilat hips on 3/5.     PT Comments    Patient progressing with ambulation in hallway this session without much assist.  Remains rather antalgic on L LE and having to unweight during standing ADL at sink.  Though mobility improved may still need SNF level care for continued antibiotics upon d/c.  PT to follow.    Follow Up Recommendations  Home health PT;Supervision/Assistance - 24 hour     Equipment Recommendations  Rolling walker with 5" wheels    Recommendations for Other Services       Precautions / Restrictions Precautions Precautions: Fall    Mobility  Bed Mobility   Bed Mobility: Sidelying to Sit;Supine to Sit     Supine to sit: Min guard;HOB elevated Sit to supine: Mod assist   General bed mobility comments: assist for balance coming up to sit, assist for legs into bed to supine  Transfers Overall transfer level: Needs assistance Equipment used: Rolling walker (2 wheeled) Transfers: Sit to/from Stand Sit to Stand: Min guard         General transfer comment: assist for safety  Ambulation/Gait   Ambulation Distance (Feet): 50 Feet Assistive device: Rolling walker (2 wheeled) Gait Pattern/deviations: Step-through pattern;Decreased stride length;Shuffle;Antalgic;Decreased stance time - left      General Gait Details: heavily leans on walker, limited tolerance to weight on L LE knee flexed throughout   Stairs            Wheelchair Mobility    Modified Rankin (Stroke Patients Only)       Balance Overall balance assessment: Needs assistance   Sitting balance-Leahy Scale: Good     Standing balance support: During functional activity;Single extremity supported;No upper extremity supported Standing balance-Leahy Scale: Fair Standing balance comment: one UE support vs none at sink to brush teeth and shave limited weight on L LE                            Cognition Arousal/Alertness: Awake/alert Behavior During Therapy: WFL for tasks assessed/performed Overall Cognitive Status: Within Functional Limits for tasks assessed                                        Exercises Total Joint Exercises Ankle Circles/Pumps: AROM;Both;10 reps;Supine Heel Slides: AROM;Both;10 reps;Seated    General Comments        Pertinent Vitals/Pain Pain Assessment: 0-10 Pain Score: 7  Pain Location: hips, L shoulder and knee Pain Descriptors / Indicators: Aching;Sore Pain Intervention(s): Monitored during session;Repositioned    Home Living                      Prior Function  PT Goals (current goals can now be found in the care plan section) Progress towards PT goals: Progressing toward goals    Frequency    Min 3X/week      PT Plan Discharge plan needs to be updated    Co-evaluation              AM-PAC PT "6 Clicks" Daily Activity  Outcome Measure  Difficulty turning over in bed (including adjusting bedclothes, sheets and blankets)?: A Little Difficulty moving from lying on back to sitting on the side of the bed? : Unable Difficulty sitting down on and standing up from a chair with arms (e.g., wheelchair, bedside commode, etc,.)?: A Little Help needed moving to and from a bed to chair (including a wheelchair)?:  Total Help needed walking in hospital room?: A Little Help needed climbing 3-5 steps with a railing? : A Lot 6 Click Score: 13    End of Session Equipment Utilized During Treatment: Gait belt Activity Tolerance: Patient limited by fatigue Patient left: in bed;with bed alarm set;with call bell/phone within reach   PT Visit Diagnosis: Muscle weakness (generalized) (M62.81);Difficulty in walking, not elsewhere classified (R26.2);Pain Pain - Right/Left: (both) Pain - part of body: Hip     Time: 1420-1445 PT Time Calculation (min) (ACUTE ONLY): 25 min  Charges:  $Gait Training: 8-22 mins $Therapeutic Activity: 8-22 mins                    G CodesMagda Kiel, [place] [phone] 10/10/2017    Reginia Naas 10/10/2017, 5:29 PM

## 2017-10-10 NOTE — Progress Notes (Signed)
PROGRESS NOTE  Gary Hicks TMH:[id number] DOB: 1985/12/23 DOA: 09/11/2017 PCP: Patient, [name] Per  HPI/Recap of past 56 hours: 32 year old male, active IVDA, who presents with generalized pain. Patient was incarcerated and released 3-4 weeks ago. Patient admits using IV methamphetamine as well as heroin in the past 24 hours prior to admission. Reports global polyarthralgias including his bilateral feet, bilateral knees, bilateral hips and shoulders. Also reports fevers and chills generalized myalgias. Pt admitted for further management. 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 Pt was also diagnosed with DVT Right gastrocnemius vein . He was started on Lovenox 2/18. He was also diagnosed with left knee septic infection. He underwent bursectomy and I and D by Dr Percell Miller 2-25. He reported worsening left hip pain 3-02, MRI showed, B/L hip Septic arthritis and 13 mm fluid collection in the posterior aspect of the left obturator internus muscle most concerning for a small abscess. IR was consulted for arthrocentesis, patient couldn't tolerate procedure. Dr Percell Miller performed I and D of bilateral hip on 3/5.  Today, pt denies any new complaints, reports intermittent hand numbness, will monitor. Advised pt on the need to ambulate. Denies any chest pain, SOB, N/V, fever/chills, abdominal pain.   Assessment/Plan: Principal Problem:   Anxiety Active Problems:   Heroin abuse (HCC)   Polysubstance abuse (HCC)   Severe sepsis ([place])   Bacteremia due to Staphylococcus aureus   Cigarette smoker   Hypokalemia   DIC (disseminated intravascular coagulation) (HCC)   Staphylococcal arthritis of right shoulder (HCC)   Septic infrapatellar bursitis of left knee   Staphylococcal arthritis of left hip (HCC)   Staphylococcal arthritis of right hip (HCC)   Endocarditis of tricuspid valve   Calf abscess   Pain   Pyomyositis   Saphenous vein thrombophlebitis,  right  Tricuspid valve endocarditis with MSSA bacteremia Blood culture 2/11 grew MSSA, repeat on 2/15 & 3/7, NGTD ECHO showed: Tricuspid valve: There was a large, 1.1 cm (W) x 1.8 cm (L), multilobulated, highly mobile vegetation on the right ventricular aspect of the anterior leaflet. 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 Ensure. Nutrition consulted.   IVDA  Dr Damita Dunnings managing Suboxone BID  No high dose IV benzo unless required for seizure  [facility] Psych consulted: continue zoloft 50 mg daily May continue Klonopin prn for 2 weeks and wean off, not advised for long term.  Re-consulted Psych for follow up: Rec increasing zoloft, and tapering off klonipin, pt very upset about plan, doesn't want zoloft increased, worried about side effects, but doesn't want to talk about it.  Seizures Likely related to drug use Seizure precautions PRN Ativan MRI negative  Iron  def anemia Continue POiron Hb stable  DVT right Gastrocnemius vein Also superficial saphenous thrombophlebitis.  Therapeutic lovenox  Thrombocytopenia Resolved  Hyponatremia Resolved   Hepatitis c antibody  positive ID on board  Hypokalemia; resolved.   Vitamin D deficiency; started on supplementation.      Code Status: Full  Family Communication: None at bedside  Disposition Plan: Until IV AB is completed   Consultants:  ID  Ortho  Procedures:  I&D of L knee on 09/23/17  I &D of bilateral hip on 10/04/17  Antimicrobials:  IV Ancef   DVT prophylaxis:  Therapeutic lovenox   Objective: Vitals:   10/09/17 1820 10/09/17 2043 10/10/17 0455 10/10/17 1032  BP: 112/66 94/69 93/70  100/68  Pulse: (!) 118 (!) 120 (!) 106 100  Resp: 18 18 18 18   Temp: 98.4 F (36.9 C) 99.2 F (37.3 C) 98.4 F (36.9 C) 98 F (36.7 C)  TempSrc: Oral Oral Oral Oral  SpO2: 97% 96% 97% 97%  Weight:      Height:        Intake/Output Summary (Last 24 hours) at 10/10/2017 1440 Last data filed at 10/10/2017 0900 Gross per 24 hour  Intake 340 ml  Output 1200 ml  Net -860 ml   Filed Weights   10/05/17 2052 10/06/17 2218 10/08/17 2300  Weight: 59 kg (130 lb 1.1 oz) 59.6 kg (131 lb 6.3 oz) 60 kg (132 lb 4.4 oz)    Exam:   General:  NAD  Cardiovascular: S1, S2 RRR  Respiratory: CTA  Abdomen: Soft, NT, ND, BS+   Musculoskeletal: No pedal edema  Skin: Normal, no rash  Psychiatry: Flat affect   Data Reviewed: CBC: Recent Labs  Lab 10/04/17 0306 10/06/17 0405 10/07/17 0454 10/08/17 0452  WBC 9.1 12.3* 8.4 8.9  NEUTROABS  --  8.5* 5.8 6.0  HGB 9.2* 8.8* 8.9* 8.5*  HCT 28.2* 27.3* 27.0* 25.8*  MCV 94.6 93.8 94.4 94.2  PLT 272 281 257 384   Basic Metabolic Panel: Recent Labs  Lab 10/06/17 0405 10/07/17 0454 10/08/17 0452  NA 130* 137 135  K 4.1 3.8 3.9  CL 94* 100* 96*  CO2 27 28 30   GLUCOSE 103* 107* 103*  BUN 7 8 5*  CREATININE 0.55* 0.51* 0.48*  CALCIUM 7.8* 8.3* 8.3*   GFR: Estimated Creatinine Clearance: 113.5 mL/min (A) (by C-G formula based on SCr of 0.48 mg/dL (L)). Liver Function Tests: No results for input(s): AST, ALT, ALKPHOS, BILITOT, PROT,  ALBUMIN in the last 168 hours. No results for input(s): LIPASE, AMYLASE in the last 168 hours. No results for input(s): AMMONIA in the last 168 hours. Coagulation Profile: No results for input(s): INR, PROTIME in the last 168 hours. Cardiac Enzymes: No results for input(s): CKTOTAL, CKMB, CKMBINDEX, TROPONINI in the last 168 hours. BNP (last 3 results) No results for input(s): PROBNP in the last 8760 hours. HbA1C: No results for input(s): HGBA1C in the last 72 hours. CBG: No results for input(s): GLUCAP in the last 168 hours. Lipid Profile: No results for input(s): CHOL, HDL, LDLCALC, TRIG, CHOLHDL, LDLDIRECT in the last 72 hours. Thyroid Function Tests: No results for input(s): TSH, T4TOTAL, FREET4, T3FREE, THYROIDAB in the last 72 hours. Anemia Panel: No results for input(s): VITAMINB12, FOLATE, FERRITIN, TIBC, IRON, RETICCTPCT in the last 72 hours. Urine analysis:    Component Value Date/Time   COLORURINE YELLOW 10/06/2017 1746   APPEARANCEUR CLEAR 10/06/2017 1746   LABSPEC 1.017 10/06/2017 1746   PHURINE 7.0 10/06/2017 1746  GLUCOSEU NEGATIVE 10/06/2017 1746   HGBUR MODERATE (A) 10/06/2017 1746   BILIRUBINUR NEGATIVE 10/06/2017 1746   KETONESUR NEGATIVE 10/06/2017 1746   PROTEINUR NEGATIVE 10/06/2017 1746   UROBILINOGEN 0.2 05/16/2012 0847   NITRITE NEGATIVE 10/06/2017 1746   LEUKOCYTESUR NEGATIVE 10/06/2017 1746   Sepsis Labs: @LABRCNTIP (procalcitonin:4,lacticidven:4)  ) Recent Results (from the past 240 hour(s))  Urine Culture     Status: Abnormal   Collection Time: 10/06/17  5:46 PM  Result Value Ref Range Status   Specimen Description URINE, CLEAN CATCH  Final   Special Requests NONE  Final   Culture (A)  Final    <10,000 COLONIES/mL Performed at [facility], [place] [address] [place], [place] [address]    Report Status 10/08/2017 FINAL  Final  Culture, blood (routine x 2)     Status: None (Preliminary result)   Collection Time: 10/06/17  8:46 PM   Result Value Ref Range Status   Specimen Description BLOOD LEFT FOREARM  Final   Special Requests IN PEDIATRIC BOTTLE Blood Culture adequate volume  Final   Culture   Final    NO GROWTH 4 DAYS Performed at [facility], [place] [address] [place], [place] [address]    Report Status PENDING  Incomplete  Culture, blood (routine x 2)     Status: None (Preliminary result)   Collection Time: 10/06/17  8:58 PM  Result Value Ref Range Status   Specimen Description BLOOD LEFT FOREARM  Final   Special Requests IN PEDIATRIC BOTTLE Blood Culture adequate volume  Final   Culture   Final    NO GROWTH 4 DAYS Performed at [facility], [place] [address] [place], [place] [address]    Report Status PENDING  Incomplete      Studies: No results found.  Scheduled Meds: . buprenorphine-naloxone  1 tablet Sublingual BID  . enoxaparin (LOVENOX) injection  90 mg Subcutaneous Q24H  . famotidine  20 mg Oral Daily  . feeding supplement (ENSURE ENLIVE)  237 mL Oral TID BM  . ferrous sulfate  325 mg Oral BID WC  . multivitamin with minerals  1 tablet Oral Daily  . nicotine  14 mg Transdermal Daily  . polyethylene glycol  17 g Oral BID  . senna-docusate  1 tablet Oral BID  . sertraline  50 mg Oral Daily  . Vitamin D (Ergocalciferol)  50,000 Units Oral Q Mon    Continuous Infusions: .  ceFAZolin (ANCEF) IV 2 g (10/10/17 4034)  . lactated ringers 10 mL/hr at 09/23/17 1329  . lactated ringers 10 mL/hr at 10/04/17 1237     LOS: 29 days     Alma Friendly, MD [facility]   If 7PM-7AM, please contact night-coverage [email or web address] Password [email or web address] 10/10/2017, 2:40 PM

## 2017-10-10 NOTE — Progress Notes (Signed)
Pt refusing lovenox this morning. Pt very rude to this RN, using curse words towards her. Pt given pain medicine as requested and attempted to admin his lovenox. Pt states he doesn't want it because he is sleeping.

## 2017-10-10 NOTE — Progress Notes (Signed)
Nutrition Follow-up  DOCUMENTATION CODES:   Not applicable  INTERVENTION:   -Continue MVI daily  -Continue Ensure Enlive po BID, each supplement provides 350 kcal and 20 grams of protein  -Snacks between meals  -Pt on Regular diet, encourage family/friends to bring in food for patient if patient prefers this to hospital food  -Bowel regimen per MD  NUTRITION DIAGNOSIS:   Increased nutrient needs related to acute illness as evidenced by increased estimated needs from energy and protein.  Continues   GOAL:   Patient will meet greater than or equal to 90% of their needs  -progressing   MONITOR:   PO intake, Supplement acceptance, Labs, Weight trends  ASSESSMENT:   32 yo male admitted severe sepsis with bacterial endocarditis, MSSA bacteremia. Pt with active IV drug abuse (meth and heroin)  Pt with metastatic infection including septic L knee joint, septic hip infection 2/22 I&D of L knee prepatellar bursitis. Pt s/p I & D of hip 3/5  Pt reports appetite improving, pt not eating much from meal trays but is drinking some supplements and eating some snacks and foods brought from outside the hospital. Pt drinking Nepro at time of RD visit. Pt does not really like the hospital food. Pt with multiple snacks available at bedside. Per chart, pt appears to have lost 9lbs since admit but weight seems to be stabilizing now. Continue snacks and supplements per pt request. RD will continue to monitor weights.   Labs reviewed: none recent   Meds: suboxone, lovenox, pepcid, ferrous sulfate, MVI, nicotine, miralax, senokot, Vit D, cefazolin, oxycodone   Diet Order:  Seizure precautions Diet regular Room service appropriate? Yes; Fluid consistency: Thin  EDUCATION NEEDS:   Education needs have been addressed  Skin:  Incision knee and hip   Last BM:  3/9  Height:   Ht Readings from Last 1 Encounters:  09/23/17 5\' 9"  (1.753 m)    Weight:   Wt Readings from Last 1  Encounters:  10/08/17 132 lb 4.4 oz (60 kg)    Ideal Body Weight:  72.7 kg  BMI:  Body mass index is 19.53 kg/m.  Estimated Nutritional Needs:   Kcal:  0932-6712 kcals  Protein:  110-120 g  Fluid:  >/= 2 L  Koleen Distance MS, RD, LDN Pager #[phone] After Hours Pager: [phone]

## 2017-10-10 NOTE — Progress Notes (Signed)
Late Entry   Patient mother asked to speak with Leadership again regarding patient mental status. Inquired if patient wife, Kathlee Nations, could be allowed to have supervised visit. Informed both that Leadership would look into this again with Security and MD assistance.   Mother also questioned if specimens had been sent down from send hip I&D. Will follow up.   Patient stated that he is very depressed that he cannot see his wife or his daughter. Use of FaceTime is not enough. Informed that I would alert the attending MD to consult psych.   Updated attending RN. Will continue to round on patient.   Maicol Bowland MSN, RN-BC, CNML [place] [facility] Phone: [phone]

## 2017-10-10 NOTE — Progress Notes (Signed)
This RN attempted to change patient's PICC dressing. Patient adamantly refused. Will defer to [place] shift. Patient's RN informed.

## 2017-10-11 LAB — CULTURE, BLOOD (ROUTINE X 2)
CULTURE: NO GROWTH
Culture: NO GROWTH
SPECIAL REQUESTS: ADEQUATE
Special Requests: ADEQUATE

## 2017-10-11 NOTE — Progress Notes (Signed)
Pt ambulated in the hallway with this RN using the rolling walker. Pat ambulated 50 ft. Pt hen assisted to the restroom and to sink in order to shave his mustache. Upon return to the bed pt with complaints of nausea. Pt medicated with 4 mg IV Zofran per order. Dorthey Sawyer, RN

## 2017-10-11 NOTE — Progress Notes (Signed)
PROGRESS NOTE  Jakson Delpilar Mogg YKD:[id number] DOB: January 18, 1986 DOA: 09/11/2017 PCP: Patient, [name] Per  HPI/Recap of past 38 hours: 32 year old male, active IVDA, who presents with generalized pain. Patient was incarcerated and released 3-4 weeks ago. Patient admits using IV methamphetamine as well as heroin in the past 24 hours prior to admission. Reports global polyarthralgias including his bilateral feet, bilateral knees, bilateral hips and shoulders. Also reports fevers and chills generalized myalgias. Pt admitted for further management. 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 Pt was also diagnosed with DVT Right gastrocnemius vein . He was started on Lovenox 2/18. He was also diagnosed with left knee septic infection. He underwent bursectomy and I and D by Dr Percell Miller 2-25. He reported worsening left hip pain 3-02, MRI showed, B/L hip Septic arthritis and 13 mm fluid collection in the posterior aspect of the left obturator internus muscle most concerning for a small abscess. IR was consulted for arthrocentesis, patient couldn't tolerate procedure. Dr Percell Miller performed I and D of bilateral hip on 3/5.  Today, pt denies any new complaints, met pt resting. Denies any chest pain, SOB, N/V, fever/chills, abdominal pain.   Assessment/Plan: Principal Problem:   Anxiety Active Problems:   Heroin abuse (HCC)   Polysubstance abuse (HCC)   Severe sepsis ([place])   Bacteremia due to Staphylococcus aureus   Cigarette smoker   Hypokalemia   DIC (disseminated intravascular coagulation) (HCC)   Staphylococcal arthritis of right shoulder (HCC)   Septic infrapatellar bursitis of left knee   Staphylococcal arthritis of left hip (HCC)   Staphylococcal arthritis of right hip (HCC)   Endocarditis of tricuspid valve   Calf abscess   Pain   Pyomyositis   Saphenous vein thrombophlebitis, right  Tricuspid valve endocarditis with MSSA bacteremia Blood culture 2/11  grew MSSA, repeat on 2/15 & 3/7, NGTD ECHO showed: Tricuspid valve: There was a large, 1.1 cm (W) x 1.8 cm (L), multilobulated, highly mobile vegetation on the right ventricular aspect of the anterior leaflet. 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 Ensure. Nutrition consulted.   IVDA  Dr Damita Dunnings managing Suboxone BID  No high dose IV benzo unless required for seizure  [facility] Psych consulted: continue zoloft 50 mg daily May continue Klonopin prn for 2 weeks and wean off, not advised for long term.  Re-consulted Psych for follow up: Rec increasing zoloft, and tapering off klonipin, pt very upset about plan, doesn't want zoloft increased, worried about side effects, but doesn't want to talk about it.  Seizures Likely related to drug use Seizure precautions PRN Ativan MRI negative  Iron def anemia Continue POiron Hb stable  DVT right Gastrocnemius  vein Also superficial saphenous thrombophlebitis.  Therapeutic lovenox  Thrombocytopenia Resolved  Hyponatremia Resolved   Hepatitis c antibody positive ID on board  Hypokalemia; resolved.   Vitamin D deficiency;  started on supplementation.      Code Status: Full  Family Communication: None at bedside  Disposition Plan: Until IV AB is completed   Consultants:  ID  Ortho  Procedures:  I&D of L knee on 09/23/17  I &D of bilateral hip on 10/04/17  Antimicrobials:  IV Ancef   DVT prophylaxis:  Therapeutic lovenox   Objective: Vitals:   10/10/17 1032 10/10/17 2113 10/11/17 0602 10/11/17 0930  BP: 100/68 1_0  Pulse: 100 98 (!) 112 (!) 108  Resp: _1 Temp: 98 F (36.7 C) 98.4 F (36.9 C) 98.2 F (36.8 C) 99.1 F (37.3 C)  TempSrc: Oral Oral Oral Oral  SpO2: 97% 98% 98% 94%  Weight:  60.2 kg (132 lb 11.5 oz)    Height:        Intake/Output Summary (Last 24 hours) at 10/11/2017 1632 Last data filed at 10/11/2017 0211 Gross per 24 hour  Intake 340 ml  Output 0 ml  Net 340 ml   Filed Weights   10/06/17 2218 10/08/17 2300 10/10/17 2113  Weight: 59.6 kg (131 lb 6.3 oz) 60 kg (132 lb 4.4 oz) 60.2 kg (132 lb 11.5 oz)    Exam:   General:  NAD  Cardiovascular: S1, S2 RRR  Respiratory: CTA  Abdomen: Soft, NT, ND, BS+   Musculoskeletal: No pedal edema  Skin: Normal, no rash  Psychiatry: Flat affect   Data Reviewed: CBC: Recent Labs  Lab 10/06/17 0405 10/07/17 0454 10/08/17 0452  WBC 12.3* 8.4 8.9  NEUTROABS 8.5* 5.8 6.0  HGB 8.8* 8.9* 8.5*  HCT 27.3* 27.0* 25.8*  MCV 93.8 94.4 94.2  PLT 281 257 173   Basic Metabolic Panel: Recent Labs  Lab 10/06/17 0405 10/07/17 0454 10/08/17 0452  NA 130* 137 135  K 4.1 3.8 3.9  CL 94* 100* 96*  CO2 _2 GLUCOSE 103* 107* 103*  BUN 7 8 5*  CREATININE 0.55* 0.51* 0.48*  CALCIUM 7.8* 8.3* 8.3*   GFR: Estimated Creatinine Clearance: 113.9 mL/min (A) (by C-G formula based on SCr of 0.48 mg/dL (L)). Liver Function Tests: No results for input(s): AST, ALT, ALKPHOS, BILITOT, PROT, ALBUMIN in the last 168 hours. No results for input(s): LIPASE, AMYLASE in the last 168 hours. No  results for input(s): AMMONIA in the last 168 hours. Coagulation Profile: No results for input(s): INR, PROTIME in the last 168 hours. Cardiac Enzymes: No results for input(s): CKTOTAL, CKMB, CKMBINDEX, TROPONINI in the last 168 hours. BNP (last 3 results) No results for input(s): PROBNP in the last 8760 hours. HbA1C: No results for input(s): HGBA1C in the last 72 hours. CBG: No results for input(s): GLUCAP in the last 168 hours. Lipid Profile: No results for input(s): CHOL, HDL, LDLCALC, TRIG, CHOLHDL, LDLDIRECT in the last 72 hours. Thyroid Function Tests: No results for input(s): TSH, T4TOTAL, FREET4, T3FREE, THYROIDAB in the last 72 hours. Anemia Panel: No results for input(s): VITAMINB12, FOLATE, FERRITIN, TIBC, IRON, RETICCTPCT in the last 72 hours. Urine analysis:    Component Value Date/Time   COLORURINE YELLOW 10/06/2017 [place] 10/06/2017 1746   LABSPEC 1.017 10/06/2017 1746   PHURINE 7.0 10/06/2017 1746   GLUCOSEU NEGATIVE 10/06/2017 1746   HGBUR MODERATE (A) 10/06/2017 1746   BILIRUBINUR NEGATIVE  10/06/2017 1746   [place] 10/06/2017 1746   PROTEINUR NEGATIVE 10/06/2017 1746   UROBILINOGEN 0.2 05/16/2012 0847   NITRITE NEGATIVE 10/06/2017 1746   LEUKOCYTESUR NEGATIVE 10/06/2017 1746   Sepsis Labs: _0 (procalcitonin:4,lacticidven:4)  ) Recent Results (from the past 240 hour(s))  Urine Culture     Status: Abnormal   Collection Time: 10/06/17  5:46 PM  Result Value Ref Range Status   Specimen Description URINE, CLEAN CATCH  Final   Special Requests NONE  Final   Culture (A)  Final    <10,000 COLONIES/mL Performed at [facility], [place] [address], [place], [place] [address]    Report Status 10/08/2017 FINAL  Final  Culture, blood (routine x 2)     Status: None   Collection Time: 10/06/17  8:46 PM  Result Value Ref Range Status   Specimen Description BLOOD LEFT FOREARM  Final   Special Requests IN PEDIATRIC BOTTLE  Blood Culture adequate volume  Final   Culture   Final    NO GROWTH 5 DAYS Performed at [facility], [place] [address], [place], [place] [address]    Report Status 10/11/2017 FINAL  Final  Culture, blood (routine x 2)     Status: None   Collection Time: 10/06/17  8:58 PM  Result Value Ref Range Status   Specimen Description BLOOD LEFT FOREARM  Final   Special Requests IN PEDIATRIC BOTTLE Blood Culture adequate volume  Final   Culture   Final    NO GROWTH 5 DAYS Performed at [facility], [place] [address], [place],  [address]    Report Status 10/11/2017 FINAL  Final      Studies: No results found.  Scheduled Meds: . buprenorphine-naloxone  1 tablet Sublingual BID  . enoxaparin (LOVENOX) injection  90 mg Subcutaneous Q24H  . famotidine  20 mg Oral Daily  . feeding supplement (ENSURE ENLIVE)  237 mL Oral TID BM  . ferrous sulfate  325 mg Oral BID WC  . multivitamin with minerals  1 tablet Oral Daily  . nicotine  14 mg Transdermal Daily  . polyethylene glycol  17 g Oral BID  . senna-docusate  1 tablet Oral BID  . sertraline  50 mg Oral Daily  . Vitamin D (Ergocalciferol)  50,000 Units Oral Q Mon    Continuous Infusions: .  ceFAZolin (ANCEF) IV 2 g (10/11/17 1336)  . lactated ringers 10 mL/hr at 09/23/17 1329  . lactated ringers 10 mL/hr at 10/04/17 1237     LOS: 30 days     Alma Friendly, MD [facility]   If 7PM-7AM, please contact night-coverage [email or web address] Password [facility] 10/11/2017, 4:32 PM

## 2017-10-12 DIAGNOSIS — L02419 Cutaneous abscess of limb, unspecified: Secondary | ICD-10-CM

## 2017-10-12 LAB — CBC WITH DIFFERENTIAL/PLATELET
BASOS ABS: 0 10*3/uL (ref 0.0–0.1)
BASOS PCT: 0 %
EOS ABS: 0.2 10*3/uL (ref 0.0–0.7)
EOS PCT: 2 %
HCT: 27.8 % — ABNORMAL LOW (ref 39.0–52.0)
Hemoglobin: 8.9 g/dL — ABNORMAL LOW (ref 13.0–17.0)
LYMPHS PCT: 22 %
Lymphs Abs: 2 10*3/uL (ref 0.7–4.0)
MCH: 30.3 pg (ref 26.0–34.0)
MCHC: 32 g/dL (ref 30.0–36.0)
MCV: 94.6 fL (ref 78.0–100.0)
Monocytes Absolute: 0.6 10*3/uL (ref 0.1–1.0)
Monocytes Relative: 6 %
Neutro Abs: 6.6 10*3/uL (ref 1.7–7.7)
Neutrophils Relative %: 70 %
PLATELETS: 297 10*3/uL (ref 150–400)
RBC: 2.94 MIL/uL — AB (ref 4.22–5.81)
RDW: 13.3 % (ref 11.5–15.5)
WBC: 9.3 10*3/uL (ref 4.0–10.5)

## 2017-10-12 LAB — BASIC METABOLIC PANEL
ANION GAP: 9 (ref 5–15)
BUN: 7 mg/dL (ref 6–20)
CO2: 28 mmol/L (ref 22–32)
Calcium: 8.7 mg/dL — ABNORMAL LOW (ref 8.9–10.3)
Chloride: 98 mmol/L — ABNORMAL LOW (ref 101–111)
Creatinine, Ser: 0.61 mg/dL (ref 0.61–1.24)
Glucose, Bld: 112 mg/dL — ABNORMAL HIGH (ref 65–99)
POTASSIUM: 3.9 mmol/L (ref 3.5–5.1)
SODIUM: 135 mmol/L (ref 135–145)

## 2017-10-12 MED ORDER — SODIUM CHLORIDE 0.9% FLUSH
10.0000 mL | INTRAVENOUS | Status: DC | PRN
  Administered 2017-10-20: 20 mL
  Administered 2017-10-26 – 2017-10-27 (×2): 10 mL
  Filled 2017-10-12 (×3): qty 40

## 2017-10-12 MED ORDER — SODIUM CHLORIDE 0.9% FLUSH
10.0000 mL | Freq: Two times a day (BID) | INTRAVENOUS | Status: DC
  Administered 2017-10-12 – 2017-11-03 (×11): 10 mL
  Administered 2017-11-04: 20 mL

## 2017-10-12 NOTE — Progress Notes (Signed)
Physical Therapy Treatment Patient Details Name: Gary Hicks MRN: [id number] DOB: 1986-05-19 Today's Date: 10/12/2017    History of Present Illness Pt is a 32 y/o male with active IV drug use presenting with generalized pain. Family is concerned about seizures due to finding him in fetal position with abnormal eye movements. He reports global polyarthralgia, use of heroin and methamphetamines in the past 24 hours at time of admission. Diagnosed with staph bacteremia, severe sepsis. PMH bacterial endocarditis, heroin abuse, possible Hepatitis C, opiate abuse, polysubstance abuse, seizures.  Pt also with positive right LE DVT on 2/18.  On Lovenox.  Pt is s/p I & D of bilat hips on 3/5.     PT Comments    Needed encouragement, but once agreed, pt pushed much further than other days.    Follow Up Recommendations  Supervision/Assistance - 24 hour;SNF     Equipment Recommendations  Rolling walker with 5" wheels    Recommendations for Other Services       Precautions / Restrictions Precautions Precautions: Fall    Mobility  Bed Mobility Overal bed mobility: Needs Assistance Bed Mobility: Supine to Sit;Sit to Supine     Supine to sit: Supervision Sit to supine: Supervision   General bed mobility comments: no rail, no assist  Transfers Overall transfer level: Needs assistance Equipment used: Rolling walker (2 wheeled) Transfers: Sit to/from Stand Sit to Stand: Min guard         General transfer comment: effortful, seemingly painful sit to stand into the RW  Ambulation/Gait Ambulation/Gait assistance: Min guard Ambulation Distance (Feet): 120 Feet Assistive device: Rolling walker (2 wheeled) Gait Pattern/deviations: Step-through pattern;Decreased stride length;Shuffle;Antalgic;Decreased stance time - left   Gait velocity interpretation: Below normal speed for age/gender General Gait Details: moderate lean on fht RW.  Partial weight on the L LE   Stairs             Wheelchair Mobility    Modified Rankin (Stroke Patients Only)       Balance Overall balance assessment: Needs assistance   Sitting balance-Leahy Scale: Good     Standing balance support: Single extremity supported;Bilateral upper extremity supported Standing balance-Leahy Scale: Fair                              Cognition Arousal/Alertness: Awake/alert Behavior During Therapy: WFL for tasks assessed/performed Overall Cognitive Status: Within Functional Limits for tasks assessed                                        Exercises      General Comments        Pertinent Vitals/Pain Pain Assessment: 0-10 Pain Score: 7  Pain Location: hips L> R Pain Descriptors / Indicators: Aching;Sore Pain Intervention(s): Monitored during session;Limited activity within patient's tolerance    Home Living                      Prior Function            PT Goals (current goals can now be found in the care plan section) Acute Rehab PT Goals Patient Stated Goal: none stated  PT Goal Formulation: With patient/family Time For Goal Achievement: 10/21/17 Potential to Achieve Goals: Fair Progress towards PT goals: Progressing toward goals    Frequency    Min 3X/week  PT Plan Current plan remains appropriate    Co-evaluation              AM-PAC PT "6 Clicks" Daily Activity  Outcome Measure  Difficulty turning over in bed (including adjusting bedclothes, sheets and blankets)?: None Difficulty moving from lying on back to sitting on the side of the bed? : A Little Difficulty sitting down on and standing up from a chair with arms (e.g., wheelchair, bedside commode, etc,.)?: A Lot Help needed moving to and from a bed to chair (including a wheelchair)?: A Little Help needed walking in hospital room?: A Little Help needed climbing 3-5 steps with a railing? : A Lot 6 Click Score: 17    End of Session   Activity Tolerance: Patient  limited by fatigue Patient left: in bed;with bed alarm set;with call bell/phone within reach Nurse Communication: Mobility status PT Visit Diagnosis: Unsteadiness on feet (R26.81);Other abnormalities of gait and mobility (R26.89) Pain - Right/Left: Left Pain - part of body: Hip     Time: 1202-1218 PT Time Calculation (min) (ACUTE ONLY): 16 min  Charges:  $Gait Training: 8-22 mins                    G Codes:       10-14-17  Donnella Sham, PT [phone]  (pager)   Tessie Fass Nikoletta Varma 2017/10/14, 4:12 PM

## 2017-10-12 NOTE — Progress Notes (Signed)
[facility]                                                                              Gary Hicks Demographics  Gary Hicks, is a 32 y.o. male, DOB - 01-Jan-1986, VQQ:[id number]  Admit date - 09/11/2017   Admitting Physician Quintella Baton, MD  Outpatient Primary MD for the Gary Hicks is Gary Hicks, [name] Per  Outpatient specialists:   LOS - 31  days   Medical records reviewed and are as summarized below:    Chief Complaint  Gary Hicks presents with  . Weakness  . Leg Pain       Brief summary   32 year old male, active IVDA, who presents with generalized pain. Gary Hicks was incarcerated and released 3-4 weeks ago. Gary Hicks admits using IV methamphetamine as well as heroin in the past 24 hours prior to admission. Reports global polyarthralgias including his bilateral feet, bilateral knees, bilateral hips and shoulders. Also reports fevers and chills generalized myalgias. Pt admitted for further management. 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 Pt was also diagnosed with DVT Right gastrocnemius vein . He was started on Lovenox 2/18.He was also diagnosed with left knee septic infection. He underwent bursectomy and I and D by Dr Percell Miller 2-25.He reported worsening left hip pain 3-02, MRI showed, B/L hipSeptic arthritis and 13 mm fluid collection in the posterior aspect of the left obturator internus muscle most concerning for a small abscess.IR was consulted for arthrocentesis, Gary Hicks couldn't tolerate procedure. Dr Percell Miller performed I and D of bilateral hip on 3/5.   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  Blood cultures 2/11 grew MSSA, repeat on 2/15 and 3/7 have been negative -2D echo showed large 1.1 cmx 1.8 cm multilobulated highly mobile vegetation on the right ventricular aspect of  the anterior leaflet of the tricuspid valve.  Moderate to severe tricuspid regurgitation. -Infectious disease was consulted, recommended continue IV Ancef for 6 weeks from 2/23 till 11/05/17 -History of IV drug use, currently on Suboxone, difficult placement hence Gary Hicks will stay inpatient for the duration of antibiotics  Septic left knee, bilateral hip joints -MRI of the left knee showed 4.8 x0.8x 4 cm fluid collection in the subcutaneous fat anterior to patella tendon with severe surrounding soft tissue edema concerning for septic arthritis. -MRI hip showed septic arthritis and 13 mm fluid collection in the posterior aspect of the left obturator internus muscle concerning for small abscess -Orthopedics was consulted (Dr Percell Miller).  Status post I&D and bursectomy of the left knee prepatellar bursitis on 09/23/17, status post I&D of septic arthritis bilateral hip on 10/04/17 -Cultures intraoperatively negative -MRI brain negative for abscess, Doppler of the left arm negative for DVT.  History of IV drug use -Dr. Damita Dunnings managing Suboxone twice daily -No high-dose IV benzos unless required for seizures, no extra narcotics  Malnutrition -Continue nutritional supplements, dietitian was consulted  Generalized anxiety disorder -Psych was consulted, continue Zoloft 50 mg daily -May continue Klonopin as needed for 2 weeks and then wean  off, not advised for long-term. -Psych was reconsulted for follow-up recommended increasing the Zoloft and tapering off the Klonopin  Seizures -Likely due to drug use, continue seizure precautions, as needed Ativan  -MRI brain negative for any abscess  Iron deficiency anemia -H&H currently stable, continue iron supplementation  DVT right gastrocnemius vein with superficial saphenous thrombophlebitis -Recommended therapeutic Lovenox, Gary Hicks refusing Lovenox at times  Thrombocytopenia -Resolved  Hyponatremia, hypokalemia -Resolved  Hepatitis C antibody  positive -ID following  Vitamin D deficiency - Continue supplementation   Code Status: full  DVT Prophylaxis:  Lovenox   Family Communication: Discussed in detail with the Gary Hicks, all imaging results, lab results explained to the Gary Hicks    Disposition Plan: Will stay inpatient for the duration of IV antibiotics  Time Spent in minutes 35 minutes  Procedures:   I&D of L knee on 09/23/17  I &D of bilateral hip on 10/04/17    Consultants:   Infectious disease Orthopedics  Antimicrobials:  IV Ancef till 11/05/17  Medications  Scheduled Meds: . buprenorphine-naloxone  1 tablet Sublingual BID  . enoxaparin (LOVENOX) injection  90 mg Subcutaneous Q24H  . famotidine  20 mg Oral Daily  . feeding supplement (ENSURE ENLIVE)  237 mL Oral TID BM  . ferrous sulfate  325 mg Oral BID WC  . multivitamin with minerals  1 tablet Oral Daily  . nicotine  14 mg Transdermal Daily  . polyethylene glycol  17 g Oral BID  . senna-docusate  1 tablet Oral BID  . sertraline  50 mg Oral Daily  . sodium chloride flush  10-40 mL Intracatheter Q12H  . Vitamin D (Ergocalciferol)  50,000 Units Oral Q Mon   Continuous Infusions: .  ceFAZolin (ANCEF) IV 2 g (10/12/17 0517)  . lactated ringers 10 mL/hr at 09/23/17 1329  . lactated ringers 10 mL/hr at 10/04/17 1237   PRN Meds:.acetaminophen, alum & mag hydroxide-simeth, clonazePAM, hydrOXYzine, ondansetron **OR** ondansetron (ZOFRAN) IV, oxyCODONE, sodium chloride, sodium chloride flush, sodium chloride flush, traZODone   Antibiotics   Anti-infectives (From admission, onward)   Start     Dose/Rate Route Frequency Ordered Stop   09/23/17 0600  ceFAZolin (ANCEF) IVPB 2g/100 mL premix     2 g 200 mL/hr over 30 Minutes Intravenous To Surgery 09/22/17 1956 09/23/17 1436   09/23/17 0600  ceFAZolin (ANCEF) IVPB 2g/100 mL premix  Status:  Discontinued     2 g 200 mL/hr over 30 Minutes Intravenous On call to O.R. 09/22/17 1956 09/22/17 2002   09/15/17  1500  Oritavancin Diphosphate (ORBACTIV) 1,200 mg in dextrose 5 % IVPB     1,200 mg 333.3 mL/hr over 180 Minutes Intravenous Once 09/15/17 1357 09/15/17 2100   09/12/17 1400  ceFAZolin (ANCEF) IVPB 2g/100 mL premix     2 g 200 mL/hr over 30 Minutes Intravenous Every 8 hours 09/12/17 1300 11/05/17 2359   09/11/17 2100  vancomycin (VANCOCIN) IVPB 750 mg/150 ml premix  Status:  Discontinued     750 mg 150 mL/hr over 60 Minutes Intravenous Every 8 hours 09/11/17 2015 09/12/17 1300        Subjective:   Corene Cornea Montini was seen and examined today.  Denies any specific complaints, no fevers or chills.  No acute issues overnight.  No chest pain or shortness of breath, abdominal pain any nausea vomiting.  No diarrhea.  Objective:   Vitals:   10/11/17 0930 10/11/17 2204 10/12/17 0513 10/12/17 0847  BP: 95/68 105/71 91/62 101/66  Pulse: (!) 108 Marland Kitchen)  111 100 97  Resp: 20 20 19 20   Temp: 99.1 F (37.3 C) 98.7 F (37.1 C) 98.3 F (36.8 C) 97.8 F (36.6 C)  TempSrc: Oral Oral Oral Oral  SpO2: 94% 97% 97% 100%  Weight:  60.2 kg (132 lb 11.3 oz)    Height:        Intake/Output Summary (Last 24 hours) at 10/12/2017 1234 Last data filed at 10/12/2017 0848 Gross per 24 hour  Intake 1027 ml  Output 725 ml  Net 302 ml     Wt Readings from Last 3 Encounters:  10/11/17 60.2 kg (132 lb 11.3 oz)  09/10/17 63.5 kg (140 lb)  01/05/15 63.5 kg (140 lb)     Exam  General: Alert and oriented x 3, NAD  Eyes:   HEENT:    Cardiovascular: S1 S2 auscultated, RRR 3/6 murmur over the precordium.  Respiratory: Clear to auscultation bilaterally, no wheezing, rales or rhonchi  Gastrointestinal: Soft, nontender, nondistended, + bowel sounds  Ext: no pedal edema bilaterally  Neuro: no new deficits  Musculoskeletal: No digital cyanosis, clubbing  Skin: Extensive tattoos on the body  Psych: flat affect   Data Reviewed:  I have personally reviewed following labs and imaging studies  Micro  Results Recent Results (from the past 240 hour(s))  Urine Culture     Status: Abnormal   Collection Time: 10/06/17  5:46 PM  Result Value Ref Range Status   Specimen Description URINE, CLEAN CATCH  Final   Special Requests NONE  Final   Culture (A)  Final    <10,000 COLONIES/mL Performed at [facility], [address], [place], [place] [address]    Report Status 10/08/2017 FINAL  Final  Culture, blood (routine x 2)     Status: None   Collection Time: 10/06/17  8:46 PM  Result Value Ref Range Status   Specimen Description BLOOD LEFT FOREARM  Final   Special Requests IN PEDIATRIC BOTTLE Blood Culture adequate volume  Final   Culture   Final    NO GROWTH 5 DAYS Performed at [facility], [place] [address] [place], [place] [address]    Report Status 10/11/2017 FINAL  Final  Culture, blood (routine x 2)     Status: None   Collection Time: 10/06/17  8:58 PM  Result Value Ref Range Status   Specimen Description BLOOD LEFT FOREARM  Final   Special Requests IN PEDIATRIC BOTTLE Blood Culture adequate volume  Final   Culture   Final    NO GROWTH 5 DAYS Performed at [facility], [place] [address], [place],  [address]    Report Status 10/11/2017 FINAL  Final    Radiology Reports Ct Angio Chest Pe W Or Wo Contrast  Result Date: 09/25/2017 CLINICAL DATA:  32 year old male with LEFT LOWER extremity thrombosis. IV drug abuser. Generalized pain. Fever chills and myalgias. EXAM: CT ANGIOGRAPHY CHEST WITH CONTRAST TECHNIQUE: Multidetector CT imaging of the chest was performed using the standard protocol during bolus administration of intravenous contrast. Multiplanar CT image reconstructions and MIPs were obtained to evaluate the vascular anatomy. CONTRAST:  185mL ISOVUE-370 IOPAMIDOL (ISOVUE-370) INJECTION 76% COMPARISON:  06/06/2016 and prior chest radiographs FINDINGS: Cardiovascular: This is a technically satisfactory study. No pulmonary emboli are identified. Old healed  UPPER limits normal heart size noted. No evidence of thoracic aortic aneurysm or pericardial effusion. Mediastinum/Nodes: No enlarged mediastinal, hilar, or axillary lymph nodes. Thyroid gland, trachea, and esophagus demonstrate no significant findings. Lungs/Pleura: There  are multiple irregular bilateral pulmonary nodules with index lesions as follows: An 8 x 13 mm SUPERIOR segment RIGHT LOWER lobe nodule (6:42) A 4 x 7 mm RIGHT UPPER lobe nodule (6:56) A 12 x 20 mm RIGHT LOWER lobe nodule (6:88) A 28 x 22 mm LEFT LOWER lobe nodule (6:91). These nodules are nonspecific but most likely represent infection or possibly septic emboli. Malignancy/metastatic disease is considered less likely. A small LEFT pleural effusion and LEFT basilar atelectasis noted. Upper Abdomen: No acute abnormality. Musculoskeletal: No acute abnormality or suspicious bony lesion. Review of the MIP images confirms the above findings. IMPRESSION: 1. Multiple bilateral pulmonary nodules as described. Favor infection or possibly septic emboli. Other inflammatory processes or malignancy/metastatic disease considered less likely. 2. Small LEFT pleural effusion and LEFT basilar atelectasis 3. No evidence of pulmonary emboli or thoracic aortic aneurysm. Electronically Signed   By: Margarette Canada M.D.   On: 09/25/2017 18:42   Mr Jeri Cos HD Contrast  Result Date: 09/18/2017 CLINICAL DATA:  Endocarditis with muscle weakness and right hand numbness. EXAM: MRI HEAD WITHOUT AND WITH CONTRAST TECHNIQUE: Multiplanar, multiecho pulse sequences of the brain and surrounding structures were obtained without and with intravenous contrast. CONTRAST:  30mL MULTIHANCE GADOBENATE DIMEGLUMINE 529 MG/ML IV SOLN COMPARISON:  Head CT 09/12/2017 FINDINGS: Brain: The midline structures are normal. There is no acute infarct or acute hemorrhage. No mass lesion, hydrocephalus, dural abnormality or extra-axial collection. Focus of hyperintense T2-weighted signal within the  left frontal periventricular white matter. Otherwise normal parenchymal signal. There is atrophy greater than expected for age with ex vacuo dilatation of the lateral ventricles. 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 No chronic microhemorrhage or superficial siderosis. Vascular: Major intracranial arterial and venous sinus flow voids are preserved. Skull and upper cervical spine: The visualized skull base, calvarium, upper cervical spine and extracranial soft tissues are normal. Sinuses/Orbits: No fluid levels or advanced mucosal thickening. No mastoid or middle ear effusion. Normal orbits. IMPRESSION: Age advanced volume loss with ex vacuo dilatation of the ventricles, but no acute abnormality. Electronically Signed   By: Ulyses Jarred M.D.   On: 09/18/2017 02:30   Mr Cervical Spine Wo Contrast  Result Date: 09/20/2017 CLINICAL DATA:  Generalized pain, hand numbness and weakness. History of endocarditis and intravenous drug abuse. EXAM: MRI CERVICAL SPINE WITHOUT CONTRAST TECHNIQUE: Multiplanar, multisequence MR imaging of the cervical spine was performed. No intravenous contrast was administered. COMPARISON:  MRI of the head September 18, 2017 FINDINGS: ALIGNMENT: Straightened cervical lordosis.  No malalignment. VERTEBRAE/DISCS: Vertebral bodies are intact. Intervertebral disc morphology's and signal are normal. Mild C6-7 ventral endplate spurring. No abnormal or acute bone marrow signal. CORD:Cervical spinal cord is normal morphology and signal characteristics from the cervicomedullary junction to level of T1-2, the most caudal well visualized level. POSTERIOR FOSSA, VERTEBRAL ARTERIES, PARASPINAL TISSUES: No MR findings of ligamentous injury. Vertebral artery flow voids present. Included posterior fossa and paraspinal soft tissues are normal. 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 DISC LEVELS (axial sequences mild to moderately motion degraded): C2-3 through C7-T1: No disc bulge,  canal stenosis nor neural foraminal narrowing. Mild C3-4 through C5-6 facet arthropathy. IMPRESSION: 1. Motion degraded examination. 2. No fracture, malalignment or acute osseous process. No MR findings of infection by noncontrast MRI. 3. Early degenerative change of the cervical spine without canal stenosis or neural foraminal narrowing. Electronically Signed   By: Elon Alas M.D.   On: 09/20/2017 23:16   Mr Hip Right W Wo  Contrast  Result Date: 10/02/2017 CLINICAL DATA:  Bilateral hip and back pain.  Left worse than right. EXAM: MRI OF THE LEFT HIP WITHOUT AND WITH CONTRAST TECHNIQUE: Multiplanar, multisequence MR imaging was performed both before and after administration of intravenous contrast. CONTRAST:  83mL MULTIHANCE GADOBENATE DIMEGLUMINE 529 MG/ML IV SOLN COMPARISON:  None. FINDINGS: Bones: No hip fracture, dislocation or avascular necrosis. Marrow edema in the left femoral head and acetabulum. Mild marrow edema in the anterior right acetabulum. Bilateral pericapsular edema and enhancement. No periosteal reaction or bone destruction. No aggressive osseous lesion. Normal sacrum and sacroiliac joints. No SI joint widening or erosive changes. Articular cartilage and labrum Articular cartilage: Partial-thickness bilateral femoral heads and acetabulum, left greater than right. Small femoral inferior marginal osteophytes. Labrum: Grossly intact, but evaluation is limited by lack of intraarticular fluid. Joint or bursal effusion Joint effusion: Large left hip joint effusion with severe synovitis and enhancement. Small right hip joint effusion with synovitis and enhancement. No SI joint effusion. Bursae:  No bursa formation. Muscles and tendons Muscle edema in the left obturator internus muscle with a 13 mm fluid collection posteriorly within the muscle concerning for a small abscess. Muscle edema and mild enhancement in the adductor musculature bilaterally and right iliacus muscle. Muscle edema and  enhancement of bilateral multifidus muscles. Other findings Miscellaneous: No pelvic free fluid. No fluid collection or hematoma. No inguinal lymphadenopathy. No inguinal hernia. IMPRESSION: 1. Large left hip joint effusion with enhancing synovitis, pericapsular edema and severe marrow edema on either side of the joint most concerning for septic arthritis. 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 2. Small right hip joint effusion with enhancing synovitis and pericapsular edema and enhancement most concerning for septic arthritis. 3. Muscle edema within the adductor musculature bilaterally and multifidus muscles bilaterally most concerning for infectious myositis. 13 mm fluid collection in the posterior aspect of the left obturator internus muscle most concerning for a small abscess. Electronically Signed   By: Kathreen Devoid   On: 10/02/2017 11:36   Mr Hip Left W Wo Contrast  Result Date: 10/02/2017 CLINICAL DATA:  Bilateral hip and back pain.  Left worse than right. EXAM: MRI OF THE LEFT HIP WITHOUT AND WITH CONTRAST TECHNIQUE: Multiplanar, multisequence MR imaging was performed both before and after administration of intravenous contrast. CONTRAST:  40mL MULTIHANCE GADOBENATE DIMEGLUMINE 529 MG/ML IV SOLN COMPARISON:  None. FINDINGS: Bones: No hip fracture, dislocation or avascular necrosis. Marrow edema in the left femoral head and acetabulum. Mild marrow edema in the anterior right acetabulum. Bilateral pericapsular edema and enhancement. No periosteal reaction or bone destruction. No aggressive osseous lesion. Normal sacrum and sacroiliac joints. No SI joint widening or erosive changes. Articular cartilage and labrum Articular cartilage: Partial-thickness bilateral femoral heads and acetabulum, left greater than right. Small femoral inferior marginal osteophytes. Labrum: Grossly intact, but evaluation is limited by lack of intraarticular fluid. Joint or bursal effusion Joint effusion:  Large left hip joint effusion with severe synovitis and enhancement. Small right hip joint effusion with synovitis and enhancement. No SI joint effusion. Bursae:  No bursa formation. Muscles and tendons Muscle edema in the left obturator internus muscle with a 13 mm fluid collection posteriorly within the muscle concerning for a small abscess. Muscle edema and mild enhancement in the adductor musculature bilaterally and right iliacus muscle. Muscle edema and enhancement of bilateral multifidus muscles. Other findings Miscellaneous: No pelvic free fluid. No fluid collection or hematoma. No inguinal lymphadenopathy. No inguinal hernia. IMPRESSION: 1.  Large left hip joint effusion with enhancing synovitis, pericapsular edema and severe marrow edema on either side of the joint most concerning for septic arthritis. 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 2. Small right hip joint effusion with enhancing synovitis and pericapsular edema and enhancement most concerning for septic arthritis. 3. Muscle edema within the adductor musculature bilaterally and multifidus muscles bilaterally most concerning for infectious myositis. 13 mm fluid collection in the posterior aspect of the left obturator internus muscle most concerning for a small abscess. Electronically Signed   By: Kathreen Devoid   On: 10/02/2017 11:36   Mr Shoulder Right Wo Contrast  Result Date: 09/15/2017 CLINICAL DATA:  Fevers, chills, generalized myalgias, and polyarthralgia, involving the bilateral shoulders, hips, knees, and feet. History of IV drug abuse. EXAM: MRI OF THE RIGHT SHOULDER WITHOUT CONTRAST TECHNIQUE: Multiplanar, multisequence MR imaging of the shoulder was performed. No intravenous contrast was administered. COMPARISON:  Right shoulder x-rays dated September 11, 2017. FINDINGS: Despite efforts by the technologist and Gary Hicks, motion artifact is present on today's exam and could not be eliminated. This reduces exam  sensitivity and specificity. Rotator cuff: Intact supraspinatus, infraspinatus, teres minor and subscapularis tendons. Muscles: Mild edema within the supraspinatus muscle and along the posterior aspect of the infraspinatus muscle. No muscle atrophy. Biceps long head:  Intact and normally positioned. Acromioclavicular Joint: Normal acromioclavicular joint. Type II acromion. Small subacromial/subdeltoid bursal fluid. Glenohumeral Joint: No joint effusion. No chondral defect. Labrum: Grossly intact, but evaluation is limited by lack of intraarticular fluid. Bones:  No marrow abnormality, fracture or dislocation. Other: None. IMPRESSION: 1. Mild supraspinatus and infraspinatus muscle edema suggestive of myositis. 2. Mild subacromial/subdeltoid bursitis. 3. No drainable fluid collection.  No glenohumeral joint effusion. Electronically Signed   By: Titus Dubin M.D.   On: 09/15/2017 08:22   Mr Knee Left Wo Contrast  Result Date: 09/21/2017 CLINICAL DATA:  32 year old man with active IV drug abuse, who presents with generalized pain. History is limited secondary to his acute illness including pain and fluctuations in alertness EXAM: MRI OF THE LEFT KNEE WITHOUT CONTRAST TECHNIQUE: Multiplanar, multisequence MR imaging of the knee was performed. No intravenous contrast was administered. COMPARISON:  None. FINDINGS: MENISCI Medial meniscus:  Intact. Lateral meniscus:  Intact. LIGAMENTS Cruciates:  Intact ACL and PCL. Collaterals: Medial collateral ligament is intact. Lateral collateral ligament complex is intact. CARTILAGE Patellofemoral:  No chondral defect. Medial:  No chondral defect. Lateral:  No chondral defect. Joint: No joint effusion. Normal Hoffa's fat. No plical thickening. Popliteal Fossa: No Baker's cyst. Intact popliteus tendon. Mild soft tissue edema superficial to the lateral gastrocnemius muscle. Mild soft tissue edema deep to the distal biceps femoris muscle. Extensor Mechanism: Intact quadriceps  tendon. Intact patellar tendon. Intact medial patellar retinaculum. Intact lateral patellar retinaculum. Intact MPFL. Bones: No focal marrow signal abnormality. No fracture or dislocation. Other: 4.8 x 0.8 x 4 cm complex fluid collection in the subcutaneous fat anterior to the patellar tendon with severe surrounding soft tissue edema most concerning for bursitis which may be secondarily infected. IMPRESSION: 1. 4.8 x 0.8 x 4 cm complex fluid collection in the subcutaneous fat anterior to the patellar tendon with severe surrounding soft tissue edema most concerning for bursitis which may be secondarily infected. 2. No evidence of septic arthritis. Electronically Signed   By: Kathreen Devoid   On: 09/21/2017 09:02   Mr Hip Right Wo Contrast  Result Date: 09/12/2017 CLINICAL DATA:  32 year old male admitted with progressive joint  pain since last week. Gary Hicks has been injecting meth. Pain has been migratory with bilateral ankle/feet, knee and hip involvement as well as right shoulder. Question septic arthritis. EXAM: MR OF THE RIGHT HIP WITHOUT CONTRAST TECHNIQUE: Multiplanar, multisequence MR imaging was performed. No intravenous contrast was administered. COMPARISON:  Plain radiographs 09/11/2016 FINDINGS: Bones: Osteophytes are seen about the left femoral head-neck junction. No flattening or subchondral edema of the femoral heads. No marrow edema of the femoral heads. No fracture, joint dislocation nor suspicious osseous lesions. Articular cartilage and labrum Articular cartilage: No focal chondral defects of the left acetabular or femoral head cartilage. Labrum:  No labral tear is identified about either hip. Joint or bursal effusion Joint effusion: Small bilateral hip joint effusions without significant synovial proliferation. Bursae: None Muscles and tendons Muscles and tendons: Mild nonspecific intramuscular edema involving the pectineus, adductor and obturator externus muscles about both hips and left  obturator internus. Mild gluteal edema is also noted bilaterally. Other findings Miscellaneous: Diffuse soft tissue anasarca about the included pelvis and both hips. Trace presacral edema and fluid. IMPRESSION: 1. Mild soft tissue anasarca about the pelvis and both hips. No drainable fluid collection or abscess. 2. Nonspecific mild intramuscular edema about both hips question myositis. 3. Small bilateral hip joint effusions without frank bone destruction or marrow signal abnormalities to suggest a septic joint. If clinical concern persists for septic arthritis, joint aspiration of the affected side may prove useful. Electronically Signed   By: Ashley Royalty M.D.   On: 09/12/2017 23:31   Dg Chest Port 1 View  Result Date: 10/06/2017 CLINICAL DATA:  Fever and left upper chest pain for 1 week. EXAM: PORTABLE CHEST 1 VIEW COMPARISON:  Chest CTA 09/25/2017 FINDINGS: A right PICC terminates over the high right atrium. The cardiac silhouette is normal in size. Veiling opacity in the left mid and lower hemithorax is consistent with a persistent, small to moderate-sized pleural effusion. Mild parenchymal opacity in the left lung base likely represents atelectasis. No right lung consolidation is seen. Nodular right lung opacities on the prior CT are not clearly apparent on this radiograph. No pneumothorax is identified. No acute osseous abnormality is seen. IMPRESSION: Left pleural effusion and mild left basilar atelectasis. Electronically Signed   By: Logan Bores M.D.   On: 10/06/2017 11:12   [place] Soft Tissue Non Vascular  Result Date: 09/19/2017 CLINICAL DATA:  Leg pain EXAM: ULTRASOUND RIGHT LOWER EXTREMITY LIMITED TECHNIQUE: Ultrasound examination of the lower extremity soft tissues was performed in the area of clinical concern. COMPARISON:  Four days prior FINDINGS: Medial distal thigh great saphenous vein thrombosis that appears complete. The wall appears thickened as well. A smaller and more  superficial subcutaneous vein is also thrombosed. There is regional subcutaneous reticulation. No neighboring abscess. IMPRESSION: Great saphenous vein thrombosis in the lower right thigh. A smaller neighboring subcutaneous vein is also thrombosed. Probable underlying thrombophlebitis. Consider DVT study to evaluate proximal extent. Electronically Signed   By: Monte Fantasia M.D.   On: 09/19/2017 09:47   [place] Rt Lower Extrem Ltd Soft Tissue Non Vascular  Result Date: 09/13/2017 CLINICAL DATA:  Palpable abnormality in right calf. EXAM: ULTRASOUND right LOWER EXTREMITY LIMITED TECHNIQUE: Ultrasound examination of the lower extremity soft tissues was performed in the area of clinical concern. COMPARISON:  None. FINDINGS: No definite mass or fluid collection or cyst is seen in the area of palpable concern in the posterior aspect of the right calf. There does appear to  be thrombosed superficial vein in this area. IMPRESSION: Probable thrombosed superficial vein seen in area of palpable concern in the right calf. No mass, fluid collection or cyst is noted. Electronically Signed   By: Marijo Conception, M.D.   On: 09/13/2017 12:39   Dg Hips Bilat With Pelvis 2v  Result Date: 10/01/2017 CLINICAL DATA:  Hip pain. EXAM: DG HIP (WITH OR WITHOUT PELVIS) 2V BILAT COMPARISON:  None. FINDINGS: There are degenerative changes in the left hip with an osteophyte off the femoral neck. No significant loss of joint space. No fractures or dislocations identified. No other acute abnormalities. 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  No other abnormalities. Electronically Signed   By: Dorise Bullion III M.D   On: 10/01/2017 12:46   [place] Ekg Site Rite  Result Date: 09/25/2017 If Site Rite image not attached, placement could not be confirmed due to current cardiac rhythm.   Lab Data:  CBC: Recent Labs  Lab 10/06/17 0405 10/07/17 0454 10/08/17 0452 10/12/17 0459  WBC 12.3* 8.4 8.9 9.3  NEUTROABS 8.5* 5.8 6.0  6.6  HGB 8.8* 8.9* 8.5* 8.9*  HCT 27.3* 27.0* 25.8* 27.8*  MCV 93.8 94.4 94.2 94.6  PLT 281 257 286 856   Basic Metabolic Panel: Recent Labs  Lab 10/06/17 0405 10/07/17 0454 10/08/17 0452 10/12/17 0459  NA 130* 137 135 135  K 4.1 3.8 3.9 3.9  CL 94* 100* 96* 98*  CO2 27 28 30 28   GLUCOSE 103* 107* 103* 112*  BUN 7 8 5* 7  CREATININE 0.55* 0.51* 0.48* 0.61  CALCIUM 7.8* 8.3* 8.3* 8.7*   GFR: Estimated Creatinine Clearance: 113.9 mL/min (by C-G formula based on SCr of 0.61 mg/dL). Liver Function Tests: No results for input(s): AST, ALT, ALKPHOS, BILITOT, PROT, ALBUMIN in the last 168 hours. No results for input(s): LIPASE, AMYLASE in the last 168 hours. No results for input(s): AMMONIA in the last 168 hours. Coagulation Profile: No results for input(s): INR, PROTIME in the last 168 hours. Cardiac Enzymes: No results for input(s): CKTOTAL, CKMB, CKMBINDEX, TROPONINI in the last 168 hours. BNP (last 3 results) No results for input(s): PROBNP in the last 8760 hours. HbA1C: No results for input(s): HGBA1C in the last 72 hours. CBG: No results for input(s): GLUCAP in the last 168 hours. Lipid Profile: No results for input(s): CHOL, HDL, LDLCALC, TRIG, CHOLHDL, LDLDIRECT in the last 72 hours. Thyroid Function Tests: No results for input(s): TSH, T4TOTAL, FREET4, T3FREE, THYROIDAB in the last 72 hours. Anemia Panel: No results for input(s): VITAMINB12, FOLATE, FERRITIN, TIBC, IRON, RETICCTPCT in the last 72 hours. Urine analysis:    Component Value Date/Time   COLORURINE YELLOW 10/06/2017 [place] 10/06/2017 1746   LABSPEC 1.017 10/06/2017 1746   PHURINE 7.0 10/06/2017 1746   GLUCOSEU NEGATIVE 10/06/2017 1746   HGBUR MODERATE (A) 10/06/2017 1746   BILIRUBINUR NEGATIVE 10/06/2017 1746   KETONESUR NEGATIVE 10/06/2017 1746   PROTEINUR NEGATIVE 10/06/2017 1746   UROBILINOGEN 0.2 05/16/2012 0847   NITRITE NEGATIVE 10/06/2017 1746   LEUKOCYTESUR NEGATIVE  10/06/2017 1746     Khallid Pasillas M.D. [facility] 10/12/2017, 12:34 PM  Pager: [phone] Between 7am to 7pm - call Pager - [phone]  After 7pm go to [email or web address] - password [email or web address]  Call night coverage person covering after 7pm

## 2017-10-13 ENCOUNTER — Inpatient Hospital Stay (HOSPITAL_COMMUNITY): Payer: Self-pay

## 2017-10-13 LAB — TROPONIN I

## 2017-10-13 LAB — D-DIMER, QUANTITATIVE: D-Dimer, Quant: 2.66 ug/mL-FEU — ABNORMAL HIGH (ref 0.00–0.50)

## 2017-10-13 MED ORDER — HYDROCORTISONE 1 % EX CREA
TOPICAL_CREAM | Freq: Two times a day (BID) | CUTANEOUS | Status: DC
  Administered 2017-10-13 – 2017-10-18 (×6): via TOPICAL
  Administered 2017-10-21: 1 via TOPICAL
  Administered 2017-10-28: 23:00:00 via TOPICAL
  Administered 2017-10-29: 1 via TOPICAL
  Administered 2017-10-29 – 2017-11-03 (×8): via TOPICAL
  Filled 2017-10-13 (×2): qty 28

## 2017-10-13 NOTE — Progress Notes (Signed)
01-18-2018 2:54 PM  Spoke with patient's wife about setting up visitation for her on Friday at 1430. Kathlee Nations the wife, verbalized the understanding for being banned.   Informed patient about Leadership decision. Patient verbalized understanding and appreciation.   Patient then informed this RN that he is experiencing chest pain and redness on his bottom that has led to his bottom peeling. Inform MD Rai about chest pain, new orders written. Educated patient about importance of turning every two hours and getting up out of bed as much as possible. Patient verbalized understanding.   Chester Sibert MSN, RN-BC, CNML [facility] Phone: [phone]

## 2017-10-13 NOTE — Progress Notes (Signed)
[facility]                                                                              Patient Demographics  Gary Hicks, is a 32 y.o. male, DOB - 08-22-1985, ERD:[id number]  Admit date - 09/11/2017   Admitting Physician Quintella Baton, MD  Outpatient Primary MD for the patient is Patient, [name] Per  Outpatient specialists:   LOS - 32  days   Medical records reviewed and are as summarized below:    Chief Complaint  Patient presents with  . Weakness  . Leg Pain       Brief summary   32 year old male, active IVDA, who presents with generalized pain. Patient was incarcerated and released 3-4 weeks ago. Patient admits using IV methamphetamine as well as heroin in the past 24 hours prior to admission. Reports global polyarthralgias including his bilateral feet, bilateral knees, bilateral hips and shoulders. Also reports fevers and chills generalized myalgias. Pt admitted for further management. 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 Pt was also diagnosed with DVT Right gastrocnemius vein . He was started on Lovenox 2/18.He was also diagnosed with left knee septic infection. He underwent bursectomy and I and D by Dr Percell Miller 2-25.He reported worsening left hip pain 3-02, MRI showed, B/L hipSeptic arthritis and 13 mm fluid collection in the posterior aspect of the left obturator internus muscle most concerning for a small abscess.IR was consulted for arthrocentesis, patient couldn't tolerate procedure. Dr Percell Miller performed I and D of bilateral hip on 3/5.   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  Blood cultures 2/11 grew MSSA, repeat on 2/15 and 3/7 have been negative -2D echo showed large 1.1 cmx 1.8 cm multilobulated highly mobile vegetation on the right ventricular aspect of  the anterior leaflet of the tricuspid valve.  Moderate to severe tricuspid regurgitation. -Infectious disease was consulted, recommended continue IV Ancef for 6 weeks from 2/23 till 11/05/17 -History of IV drug use, currently on Suboxone, difficult placement hence patient will stay inpatient for the duration of antibiotics -Stable, no acute issues, no changes today   Septic left knee, bilateral hip joints -MRI of the left knee showed 4.8 x0.8x 4 cm fluid collection in the subcutaneous fat anterior to patella tendon with severe surrounding soft tissue edema concerning for septic arthritis. -MRI hip showed septic arthritis and 13 mm fluid collection in the posterior aspect of the left obturator internus muscle concerning for small abscess -Orthopedics was consulted (Dr Percell Miller).  Status post I&D and bursectomy of the left knee prepatellar bursitis on 09/23/17, status post I&D of septic arthritis bilateral hip on 10/04/17 -Cultures intraoperatively negative -MRI brain negative for abscess, Doppler of the left arm negative for DVT.  History of IV drug use -Dr. Damita Dunnings managing Suboxone twice daily -No high-dose IV benzos unless required for seizures, no extra narcotics  Malnutrition -Continue nutritional supplements, dietitian was consulted  Generalized anxiety disorder -Psych was consulted, continue Zoloft 50 mg daily -May continue Klonopin  as needed for 2 weeks and then wean off, not advised for long-term. -Psych was reconsulted for follow-up recommended increasing the Zoloft and tapering off the Klonopin  Seizures -Likely due to drug use, continue seizure precautions, as needed Ativan  -MRI brain negative for any abscess  Iron deficiency anemia -H&H currently stable, continue iron supplementation  DVT right gastrocnemius vein with superficial saphenous thrombophlebitis -Recommended therapeutic Lovenox, patient refusing Lovenox at times  Thrombocytopenia -Resolved  Hyponatremia,  hypokalemia -Resolved  Hepatitis C antibody positive -ID following  Vitamin D deficiency - Continue supplementation   Code Status: full  DVT Prophylaxis:  Lovenox   Family Communication: Discussed in detail with the patient, all imaging results, lab results explained to the patient    Disposition Plan: Will stay inpatient for the duration of IV antibiotics.  No acute issues or changes today   Time Spent in minutes 15 minutes  Procedures:   I&D of L knee on 09/23/17  I &D of bilateral hip on 10/04/17    Consultants:   Infectious disease Orthopedics  Antimicrobials:  IV Ancef till 11/05/17  Medications  Scheduled Meds: . buprenorphine-naloxone  1 tablet Sublingual BID  . enoxaparin (LOVENOX) injection  90 mg Subcutaneous Q24H  . famotidine  20 mg Oral Daily  . feeding supplement (ENSURE ENLIVE)  237 mL Oral TID BM  . ferrous sulfate  325 mg Oral BID WC  . hydrocortisone cream   Topical BID  . multivitamin with minerals  1 tablet Oral Daily  . nicotine  14 mg Transdermal Daily  . polyethylene glycol  17 g Oral BID  . senna-docusate  1 tablet Oral BID  . sertraline  50 mg Oral Daily  . sodium chloride flush  10-40 mL Intracatheter Q12H  . Vitamin D (Ergocalciferol)  50,000 Units Oral Q Mon   Continuous Infusions: .  ceFAZolin (ANCEF) IV Stopped (10/13/17 0630)  . lactated ringers 10 mL/hr at 09/23/17 1329  . lactated ringers 10 mL/hr at 10/04/17 1237   PRN Meds:.acetaminophen, alum & mag hydroxide-simeth, clonazePAM, hydrOXYzine, ondansetron **OR** ondansetron (ZOFRAN) IV, oxyCODONE, sodium chloride, sodium chloride flush, sodium chloride flush, traZODone   Antibiotics   Anti-infectives (From admission, onward)   Start     Dose/Rate Route Frequency Ordered Stop   09/23/17 0600  ceFAZolin (ANCEF) IVPB 2g/100 mL premix     2 g 200 mL/hr over 30 Minutes Intravenous To Surgery 09/22/17 1956 09/23/17 1436   09/23/17 0600  ceFAZolin (ANCEF) IVPB 2g/100 mL premix   Status:  Discontinued     2 g 200 mL/hr over 30 Minutes Intravenous On call to O.R. 09/22/17 1956 09/22/17 2002   09/15/17 1500  Oritavancin Diphosphate (ORBACTIV) 1,200 mg in dextrose 5 % IVPB     1,200 mg 333.3 mL/hr over 180 Minutes Intravenous Once 09/15/17 1357 09/15/17 2100   09/12/17 1400  ceFAZolin (ANCEF) IVPB 2g/100 mL premix     2 g 200 mL/hr over 30 Minutes Intravenous Every 8 hours 09/12/17 1300 11/05/17 2359   09/11/17 2100  vancomycin (VANCOCIN) IVPB 750 mg/150 ml premix  Status:  Discontinued     750 mg 150 mL/hr over 60 Minutes Intravenous Every 8 hours 09/11/17 2015 09/12/17 1300        Subjective:   Gary Hicks was seen and examined today.  Denies any specific complaints.  No fevers, no acute issues overnight.  No chest pain or shortness of breath, abdominal pain any nausea vomiting.  No diarrhea.  Objective:   Vitals:  10/12/17 1730 10/12/17 2015 10/13/17 0537 10/13/17 0937  BP: 103/69 95/63 95/67  96/72  Pulse: (!) 113 (!) 106 (!) 112 (!) 110  Resp: 18 18 18 18   Temp: 98.5 F (36.9 C) 98.4 F (36.9 C) 98.9 F (37.2 C) 98.3 F (36.8 C)  TempSrc: Oral Oral Oral Oral  SpO2: 98% 97% 96% 96%  Weight:  60.3 kg (133 lb)    Height:        Intake/Output Summary (Last 24 hours) at 2020/08/3017 1315 Last data filed at 2020/08/3017 0900 Gross per 24 hour  Intake 1260 ml  Output 250 ml  Net 1010 ml     Wt Readings from Last 3 Encounters:  10/12/17 60.3 kg (133 lb)  09/10/17 63.5 kg (140 lb)  01/05/15 63.5 kg (140 lb)     Exam   General: Alert and oriented x 3, NAD  Eyes:   HEENT:    Cardiovascular: S1 S2 auscultated, RRR, 3/6 murmur over precordium   respiratory: Clear to auscultation bilaterally, no wheezing, rales or rhonchi  Gastrointestinal: Soft, nontender, nondistended, + bowel sounds  Ext: no pedal edema bilaterally  Neuro: no new deficits  Musculoskeletal: No digital cyanosis, clubbing  Skin: Extensive tattoo   Psych: Normal affect  and demeanor, alert and oriented x3    Data Reviewed:  I have personally reviewed following labs and imaging studies  Micro Results Recent Results (from the past 240 hour(s))  Urine Culture     Status: Abnormal   Collection Time: 10/06/17  5:46 PM  Result Value Ref Range Status   Specimen Description URINE, CLEAN CATCH  Final   Special Requests NONE  Final   Culture (A)  Final    <10,000 COLONIES/mL Performed at [facility], [address] [place], [place] [address]    Report Status 10/08/2017 FINAL  Final  Culture, blood (routine x 2)     Status: None   Collection Time: 10/06/17  8:46 PM  Result Value Ref Range Status   Specimen Description BLOOD LEFT FOREARM  Final   Special Requests IN PEDIATRIC BOTTLE Blood Culture adequate volume  Final   Culture   Final    NO GROWTH 5 DAYS Performed at [facility], [place] [address] [place], [place] [address]    Report Status 10/11/2017 FINAL  Final  Culture, blood (routine x 2)     Status: None   Collection Time: 10/06/17  8:58 PM  Result Value Ref Range Status   Specimen Description BLOOD LEFT FOREARM  Final   Special Requests IN PEDIATRIC BOTTLE Blood Culture adequate volume  Final   Culture   Final    NO GROWTH 5 DAYS Performed at [facility], [place] [address] [place], [place] [address]    Report Status 10/11/2017 FINAL  Final    Radiology Reports Ct Angio Chest Pe W Or Wo Contrast  Result Date: 09/25/2017 CLINICAL DATA:  32 year old male with LEFT LOWER extremity thrombosis. IV drug abuser. Generalized pain. Fever chills and myalgias. EXAM: CT ANGIOGRAPHY CHEST WITH CONTRAST TECHNIQUE: Multidetector CT imaging of the chest was performed using the standard protocol during bolus administration of intravenous contrast. Multiplanar CT image reconstructions and MIPs were obtained to evaluate the vascular anatomy. CONTRAST:  139mL ISOVUE-370 IOPAMIDOL (ISOVUE-370) INJECTION 76% COMPARISON:  06/06/2016 and prior chest  radiographs FINDINGS: Cardiovascular: This is a technically satisfactory study. No pulmonary emboli are identified. Old healed UPPER limits normal heart size noted. No evidence of thoracic aortic aneurysm  or pericardial effusion. Mediastinum/Nodes: No enlarged mediastinal, hilar, or axillary lymph nodes. Thyroid gland, trachea, and esophagus demonstrate no significant findings. Lungs/Pleura: There are multiple irregular bilateral pulmonary nodules with index lesions as follows: An 8 x 13 mm SUPERIOR segment RIGHT LOWER lobe nodule (6:42) A 4 x 7 mm RIGHT UPPER lobe nodule (6:56) A 12 x 20 mm RIGHT LOWER lobe nodule (6:88) A 28 x 22 mm LEFT LOWER lobe nodule (6:91). These nodules are nonspecific but most likely represent infection or possibly septic emboli. Malignancy/metastatic disease is considered less likely. A small LEFT pleural effusion and LEFT basilar atelectasis noted. Upper Abdomen: No acute abnormality. Musculoskeletal: No acute abnormality or suspicious bony lesion. Review of the MIP images confirms the above findings. IMPRESSION: 1. Multiple bilateral pulmonary nodules as described. Favor infection or possibly septic emboli. Other inflammatory processes or malignancy/metastatic disease considered less likely. 2. Small LEFT pleural effusion and LEFT basilar atelectasis 3. No evidence of pulmonary emboli or thoracic aortic aneurysm. Electronically Signed   By: Margarette Canada M.D.   On: 09/25/2017 18:42   Mr Jeri Cos VF Contrast  Result Date: 09/18/2017 CLINICAL DATA:  Endocarditis with muscle weakness and right hand numbness. EXAM: MRI HEAD WITHOUT AND WITH CONTRAST TECHNIQUE: Multiplanar, multiecho pulse sequences of the brain and surrounding structures were obtained without and with intravenous contrast. CONTRAST:  58mL MULTIHANCE GADOBENATE DIMEGLUMINE 529 MG/ML IV SOLN COMPARISON:  Head CT 09/12/2017 FINDINGS: Brain: The midline structures are normal. There is no acute infarct or acute hemorrhage. No  mass lesion, hydrocephalus, dural abnormality or extra-axial collection. Focus of hyperintense T2-weighted signal within the left frontal periventricular white matter. Otherwise normal parenchymal signal. There is atrophy greater than expected for age with ex vacuo dilatation of the lateral ventricles. 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 No chronic microhemorrhage or superficial siderosis. Vascular: Major intracranial arterial and venous sinus flow voids are preserved. Skull and upper cervical spine: The visualized skull base, calvarium, upper cervical spine and extracranial soft tissues are normal. Sinuses/Orbits: No fluid levels or advanced mucosal thickening. No mastoid or middle ear effusion. Normal orbits. IMPRESSION: Age advanced volume loss with ex vacuo dilatation of the ventricles, but no acute abnormality. Electronically Signed   By: Ulyses Jarred M.D.   On: 09/18/2017 02:30   Mr Cervical Spine Wo Contrast  Result Date: 09/20/2017 CLINICAL DATA:  Generalized pain, hand numbness and weakness. History of endocarditis and intravenous drug abuse. EXAM: MRI CERVICAL SPINE WITHOUT CONTRAST TECHNIQUE: Multiplanar, multisequence MR imaging of the cervical spine was performed. No intravenous contrast was administered. COMPARISON:  MRI of the head September 18, 2017 FINDINGS: ALIGNMENT: Straightened cervical lordosis.  No malalignment. VERTEBRAE/DISCS: Vertebral bodies are intact. Intervertebral disc morphology's and signal are normal. Mild C6-7 ventral endplate spurring. No abnormal or acute bone marrow signal. CORD:Cervical spinal cord is normal morphology and signal characteristics from the cervicomedullary junction to level of T1-2, the most caudal well visualized level. POSTERIOR FOSSA, VERTEBRAL ARTERIES, PARASPINAL TISSUES: No MR findings of ligamentous injury. Vertebral artery flow voids present. Included posterior fossa and paraspinal soft tissues are normal. Posterior fossa mega cisterna magna  versus arachnoid cyst. DISC LEVELS (axial sequences mild to moderately motion degraded): C2-3 through C7-T1: No disc bulge, canal stenosis nor neural foraminal narrowing. Mild C3-4 through C5-6 facet arthropathy. IMPRESSION: 1. Motion degraded examination. 2. No fracture, malalignment or acute osseous process. No MR findings of infection by noncontrast MRI. 3. Early degenerative change of the cervical spine without canal stenosis or neural  foraminal narrowing. Electronically Signed   By: Elon Alas M.D.   On: 09/20/2017 23:16   Mr Hip Right W Wo Contrast  Result Date: 10/02/2017 CLINICAL DATA:  Bilateral hip and back pain.  Left worse than right. EXAM: MRI OF THE LEFT HIP WITHOUT AND WITH CONTRAST TECHNIQUE: Multiplanar, multisequence MR imaging was performed both before and after administration of intravenous contrast. CONTRAST:  85mL MULTIHANCE GADOBENATE DIMEGLUMINE 529 MG/ML IV SOLN COMPARISON:  None. FINDINGS: Bones: No hip fracture, dislocation or avascular necrosis. Marrow edema in the left femoral head and acetabulum. Mild marrow edema in the anterior right acetabulum. Bilateral pericapsular edema and enhancement. No periosteal reaction or bone destruction. No aggressive osseous lesion. Normal sacrum and sacroiliac joints. No SI joint widening or erosive changes. Articular cartilage and labrum Articular cartilage: Partial-thickness bilateral femoral heads and acetabulum, left greater than right. Small femoral inferior marginal osteophytes. Labrum: Grossly intact, but evaluation is limited by lack of intraarticular fluid. Joint or bursal effusion Joint effusion: Large left hip joint effusion with severe synovitis and enhancement. Small right hip joint effusion with synovitis and enhancement. No SI joint effusion. Bursae:  No bursa formation. Muscles and tendons Muscle edema in the left obturator internus muscle with a 13 mm fluid collection posteriorly within the muscle concerning for a small  abscess. Muscle edema and mild enhancement in the adductor musculature bilaterally and right iliacus muscle. Muscle edema and enhancement of bilateral multifidus muscles. Other findings Miscellaneous: No pelvic free fluid. No fluid collection or hematoma. No inguinal lymphadenopathy. No inguinal hernia. IMPRESSION: 1. Large left hip joint effusion with enhancing synovitis, pericapsular edema and severe marrow edema on either side of the joint most concerning for septic arthritis. 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 2. Small right hip joint effusion with enhancing synovitis and pericapsular edema and enhancement most concerning for septic arthritis. 3. Muscle edema within the adductor musculature bilaterally and multifidus muscles bilaterally most concerning for infectious myositis. 13 mm fluid collection in the posterior aspect of the left obturator internus muscle most concerning for a small abscess. Electronically Signed   By: Kathreen Devoid   On: 10/02/2017 11:36   Mr Hip Left W Wo Contrast  Result Date: 10/02/2017 CLINICAL DATA:  Bilateral hip and back pain.  Left worse than right. EXAM: MRI OF THE LEFT HIP WITHOUT AND WITH CONTRAST TECHNIQUE: Multiplanar, multisequence MR imaging was performed both before and after administration of intravenous contrast. CONTRAST:  59mL MULTIHANCE GADOBENATE DIMEGLUMINE 529 MG/ML IV SOLN COMPARISON:  None. FINDINGS: Bones: No hip fracture, dislocation or avascular necrosis. Marrow edema in the left femoral head and acetabulum. Mild marrow edema in the anterior right acetabulum. Bilateral pericapsular edema and enhancement. No periosteal reaction or bone destruction. No aggressive osseous lesion. Normal sacrum and sacroiliac joints. No SI joint widening or erosive changes. Articular cartilage and labrum Articular cartilage: Partial-thickness bilateral femoral heads and acetabulum, left greater than right. Small femoral inferior marginal  osteophytes. Labrum: Grossly intact, but evaluation is limited by lack of intraarticular fluid. Joint or bursal effusion Joint effusion: Large left hip joint effusion with severe synovitis and enhancement. Small right hip joint effusion with synovitis and enhancement. No SI joint effusion. Bursae:  No bursa formation. Muscles and tendons Muscle edema in the left obturator internus muscle with a 13 mm fluid collection posteriorly within the muscle concerning for a small abscess. Muscle edema and mild enhancement in the adductor musculature bilaterally and right iliacus muscle. Muscle edema and enhancement of  bilateral multifidus muscles. Other findings Miscellaneous: No pelvic free fluid. No fluid collection or hematoma. No inguinal lymphadenopathy. No inguinal hernia. IMPRESSION: 1. Large left hip joint effusion with enhancing synovitis, pericapsular edema and severe marrow edema on either side of the joint most concerning for septic arthritis. 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 2. Small right hip joint effusion with enhancing synovitis and pericapsular edema and enhancement most concerning for septic arthritis. 3. Muscle edema within the adductor musculature bilaterally and multifidus muscles bilaterally most concerning for infectious myositis. 13 mm fluid collection in the posterior aspect of the left obturator internus muscle most concerning for a small abscess. Electronically Signed   By: Kathreen Devoid   On: 10/02/2017 11:36   Mr Shoulder Right Wo Contrast  Result Date: 09/15/2017 CLINICAL DATA:  Fevers, chills, generalized myalgias, and polyarthralgia, involving the bilateral shoulders, hips, knees, and feet. History of IV drug abuse. EXAM: MRI OF THE RIGHT SHOULDER WITHOUT CONTRAST TECHNIQUE: Multiplanar, multisequence MR imaging of the shoulder was performed. No intravenous contrast was administered. COMPARISON:  Right shoulder x-rays dated September 11, 2017. FINDINGS:  Despite efforts by the technologist and patient, motion artifact is present on today's exam and could not be eliminated. This reduces exam sensitivity and specificity. Rotator cuff: Intact supraspinatus, infraspinatus, teres minor and subscapularis tendons. Muscles: Mild edema within the supraspinatus muscle and along the posterior aspect of the infraspinatus muscle. No muscle atrophy. Biceps long head:  Intact and normally positioned. Acromioclavicular Joint: Normal acromioclavicular joint. Type II acromion. Small subacromial/subdeltoid bursal fluid. Glenohumeral Joint: No joint effusion. No chondral defect. Labrum: Grossly intact, but evaluation is limited by lack of intraarticular fluid. Bones:  No marrow abnormality, fracture or dislocation. Other: None. IMPRESSION: 1. Mild supraspinatus and infraspinatus muscle edema suggestive of myositis. 2. Mild subacromial/subdeltoid bursitis. 3. No drainable fluid collection.  No glenohumeral joint effusion. Electronically Signed   By: Titus Dubin M.D.   On: 09/15/2017 08:22   Mr Knee Left Wo Contrast  Result Date: 09/21/2017 CLINICAL DATA:  32 year old man with active IV drug abuse, who presents with generalized pain. History is limited secondary to his acute illness including pain and fluctuations in alertness EXAM: MRI OF THE LEFT KNEE WITHOUT CONTRAST TECHNIQUE: Multiplanar, multisequence MR imaging of the knee was performed. No intravenous contrast was administered. COMPARISON:  None. FINDINGS: MENISCI Medial meniscus:  Intact. Lateral meniscus:  Intact. LIGAMENTS Cruciates:  Intact ACL and PCL. Collaterals: Medial collateral ligament is intact. Lateral collateral ligament complex is intact. CARTILAGE Patellofemoral:  No chondral defect. Medial:  No chondral defect. Lateral:  No chondral defect. Joint: No joint effusion. Normal Hoffa's fat. No plical thickening. Popliteal Fossa: No Baker's cyst. Intact popliteus tendon. Mild soft tissue edema superficial to  the lateral gastrocnemius muscle. Mild soft tissue edema deep to the distal biceps femoris muscle. Extensor Mechanism: Intact quadriceps tendon. Intact patellar tendon. Intact medial patellar retinaculum. Intact lateral patellar retinaculum. Intact MPFL. Bones: No focal marrow signal abnormality. No fracture or dislocation. Other: 4.8 x 0.8 x 4 cm complex fluid collection in the subcutaneous fat anterior to the patellar tendon with severe surrounding soft tissue edema most concerning for bursitis which may be secondarily infected. IMPRESSION: 1. 4.8 x 0.8 x 4 cm complex fluid collection in the subcutaneous fat anterior to the patellar tendon with severe surrounding soft tissue edema most concerning for bursitis which may be secondarily infected. 2. No evidence of septic arthritis. Electronically Signed   By: Kathreen Devoid  On: 09/21/2017 09:02   Dg Chest Port 1 View  Result Date: 10/06/2017 CLINICAL DATA:  Fever and left upper chest pain for 1 week. EXAM: PORTABLE CHEST 1 VIEW COMPARISON:  Chest CTA 09/25/2017 FINDINGS: A right PICC terminates over the high right atrium. The cardiac silhouette is normal in size. Veiling opacity in the left mid and lower hemithorax is consistent with a persistent, small to moderate-sized pleural effusion. Mild parenchymal opacity in the left lung base likely represents atelectasis. No right lung consolidation is seen. Nodular right lung opacities on the prior CT are not clearly apparent on this radiograph. No pneumothorax is identified. No acute osseous abnormality is seen. IMPRESSION: Left pleural effusion and mild left basilar atelectasis. Electronically Signed   By: Logan Bores M.D.   On: 10/06/2017 11:12   [place] Soft Tissue Non Vascular  Result Date: 09/19/2017 CLINICAL DATA:  Leg pain EXAM: ULTRASOUND RIGHT LOWER EXTREMITY LIMITED TECHNIQUE: Ultrasound examination of the lower extremity soft tissues was performed in the area of clinical concern.  COMPARISON:  Four days prior FINDINGS: Medial distal thigh great saphenous vein thrombosis that appears complete. The wall appears thickened as well. A smaller and more superficial subcutaneous vein is also thrombosed. There is regional subcutaneous reticulation. No neighboring abscess. IMPRESSION: Great saphenous vein thrombosis in the lower right thigh. A smaller neighboring subcutaneous vein is also thrombosed. Probable underlying thrombophlebitis. Consider DVT study to evaluate proximal extent. Electronically Signed   By: Monte Fantasia M.D.   On: 09/19/2017 09:47   Dg Hips Bilat With Pelvis 2v  Result Date: 10/01/2017 CLINICAL DATA:  Hip pain. EXAM: DG HIP (WITH OR WITHOUT PELVIS) 2V BILAT COMPARISON:  None. FINDINGS: There are degenerative changes in the left hip with an osteophyte off the femoral neck. No significant loss of joint space. No fractures or dislocations identified. No other acute abnormalities. 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  No other abnormalities. Electronically Signed   By: Dorise Bullion III M.D   On: 10/01/2017 12:46   [place] Ekg Site Rite  Result Date: 09/25/2017 If Site Rite image not attached, placement could not be confirmed due to current cardiac rhythm.   Lab Data:  CBC: Recent Labs  Lab 10/07/17 0454 10/08/17 0452 10/12/17 0459  WBC 8.4 8.9 9.3  NEUTROABS 5.8 6.0 6.6  HGB 8.9* 8.5* 8.9*  HCT 27.0* 25.8* 27.8*  MCV 94.4 94.2 94.6  PLT 257 286 401   Basic Metabolic Panel: Recent Labs  Lab 10/07/17 0454 10/08/17 0452 10/12/17 0459  NA 137 135 135  K 3.8 3.9 3.9  CL 100* 96* 98*  CO2 28 30 28   GLUCOSE 107* 103* 112*  BUN 8 5* 7  CREATININE 0.51* 0.48* 0.61  CALCIUM 8.3* 8.3* 8.7*   GFR: Estimated Creatinine Clearance: 114.1 mL/min (by C-G formula based on SCr of 0.61 mg/dL). Liver Function Tests: No results for input(s): AST, ALT, ALKPHOS, BILITOT, PROT, ALBUMIN in the last 168 hours. No results for input(s): LIPASE,  AMYLASE in the last 168 hours. No results for input(s): AMMONIA in the last 168 hours. Coagulation Profile: No results for input(s): INR, PROTIME in the last 168 hours. Cardiac Enzymes: No results for input(s): CKTOTAL, CKMB, CKMBINDEX, TROPONINI in the last 168 hours. BNP (last 3 results) No results for input(s): PROBNP in the last 8760 hours. HbA1C: No results for input(s): HGBA1C in the last 72 hours. CBG: No results for input(s): GLUCAP in the last 168 hours. Lipid Profile:  No results for input(s): CHOL, HDL, LDLCALC, TRIG, CHOLHDL, LDLDIRECT in the last 72 hours. Thyroid Function Tests: No results for input(s): TSH, T4TOTAL, FREET4, T3FREE, THYROIDAB in the last 72 hours. Anemia Panel: No results for input(s): VITAMINB12, FOLATE, FERRITIN, TIBC, IRON, RETICCTPCT in the last 72 hours. Urine analysis:    Component Value Date/Time   COLORURINE YELLOW 10/06/2017 [place] 10/06/2017 1746   LABSPEC 1.017 10/06/2017 1746   PHURINE 7.0 10/06/2017 1746   GLUCOSEU NEGATIVE 10/06/2017 1746   HGBUR MODERATE (A) 10/06/2017 1746   BILIRUBINUR NEGATIVE 10/06/2017 1746   KETONESUR NEGATIVE 10/06/2017 1746   PROTEINUR NEGATIVE 10/06/2017 1746   UROBILINOGEN 0.2 05/16/2012 0847   NITRITE NEGATIVE 10/06/2017 1746   LEUKOCYTESUR NEGATIVE 10/06/2017 1746     Ripudeep Rai M.D. [facility] 06-23-202019, 1:15 PM  Pager: [phone] Between 7am to 7pm - call Pager - [phone]  After 7pm go to [email or web address] - password [email or web address]  Call night coverage person covering after 7pm

## 2017-10-14 ENCOUNTER — Inpatient Hospital Stay (HOSPITAL_COMMUNITY): Payer: Self-pay

## 2017-10-14 LAB — BASIC METABOLIC PANEL
Anion gap: 8 (ref 5–15)
BUN: 8 mg/dL (ref 6–20)
CHLORIDE: 95 mmol/L — AB (ref 101–111)
CO2: 30 mmol/L (ref 22–32)
CREATININE: 0.59 mg/dL — AB (ref 0.61–1.24)
Calcium: 8.9 mg/dL (ref 8.9–10.3)
GFR calc Af Amer: >60 mL/min (ref >60)
GLUCOSE: 115 mg/dL — AB (ref 65–99)
Potassium: 4.1 mmol/L (ref 3.5–5.1)
Sodium: 133 mmol/L — ABNORMAL LOW (ref 135–145)

## 2017-10-14 LAB — CBC
HCT: 28.7 % — ABNORMAL LOW (ref 39.0–52.0)
Hemoglobin: 9.3 g/dL — ABNORMAL LOW (ref 13.0–17.0)
MCH: 30 pg (ref 26.0–34.0)
MCHC: 32.4 g/dL (ref 30.0–36.0)
MCV: 92.6 fL (ref 78.0–100.0)
PLATELETS: 300 10*3/uL (ref 150–400)
RBC: 3.1 MIL/uL — ABNORMAL LOW (ref 4.22–5.81)
RDW: 13.2 % (ref 11.5–15.5)
WBC: 12.4 10*3/uL — AB (ref 4.0–10.5)

## 2017-10-14 MED ORDER — IOPAMIDOL (ISOVUE-370) INJECTION 76%
INTRAVENOUS | Status: AC
  Filled 2017-10-14: qty 100

## 2017-10-14 MED ORDER — BISACODYL 10 MG RE SUPP
10.0000 mg | Freq: Once | RECTAL | Status: DC
  Filled 2017-10-14: qty 1

## 2017-10-14 MED ORDER — MAGNESIUM CITRATE PO SOLN
1.0000 | Freq: Once | ORAL | Status: AC
  Administered 2017-10-14: 1 via ORAL
  Filled 2017-10-14: qty 296

## 2017-10-14 NOTE — Progress Notes (Signed)
[facility]                                                                              Patient Demographics  Gary Hicks, is a 32 y.o. male, DOB - 04-Jan-1986, GGY:[id number]  Admit date - 09/11/2017   Admitting Physician Quintella Baton, MD  Outpatient Primary MD for the patient is Patient, [name] Per  Outpatient specialists:   LOS - 33  days   Medical records reviewed and are as summarized below:    Chief Complaint  Patient presents with  . Weakness  . Leg Pain       Brief summary   32 year old male, active IVDA, who presents with generalized pain. Patient was incarcerated and released 3-4 weeks ago. Patient admits using IV methamphetamine as well as heroin in the past 24 hours prior to admission. Reports global polyarthralgias including his bilateral feet, bilateral knees, bilateral hips and shoulders. Also reports fevers and chills generalized myalgias. Pt admitted for further management. 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 Pt was also diagnosed with DVT Right gastrocnemius vein . He was started on Lovenox 2/18.He was also diagnosed with left knee septic infection. He underwent bursectomy and I and D by Dr Percell Miller 2-25.He reported worsening left hip pain 3-02, MRI showed, B/L hipSeptic arthritis and 13 mm fluid collection in the posterior aspect of the left obturator internus muscle most concerning for a small abscess.IR was consulted for arthrocentesis, patient couldn't tolerate procedure. Dr Percell Miller performed I and D of bilateral hip on 3/5.   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  Blood cultures 2/11 grew MSSA, repeat on 2/15 and 3/7 have been negative -2D echo showed large 1.1 cmx 1.8 cm multilobulated highly mobile vegetation on the right ventricular aspect of  the anterior leaflet of the tricuspid valve.  Moderate to severe tricuspid regurgitation. -Infectious disease was consulted, recommended continue IV Ancef for 6 weeks from 2/23 till 11/05/17 -History of IV drug use, currently on Suboxone, difficult placement hence patient will stay inpatient for the duration of antibiotics -No chest pain today, feeling nauseous.  Troponins negative, EKG negative, d-dimer was elevated, nonspecific likely due to acute bacteremia however CT angiogram of the chest was ordered, patient states that he does not want to do the CT chest  Septic left knee, bilateral hip joints -MRI of the left knee showed 4.8 x0.8x 4 cm fluid collection in the subcutaneous fat anterior to patella tendon with severe surrounding soft tissue edema concerning for septic arthritis. -MRI hip showed septic arthritis and 13 mm fluid collection in the posterior aspect of the left obturator internus muscle concerning for small abscess -Orthopedics was consulted (Dr Percell Miller).  Status post I&D and bursectomy of the left knee prepatellar bursitis on 09/23/17, status post I&D of septic arthritis bilateral hip on 10/04/17 -Cultures intraoperatively negative -MRI brain negative for abscess, Doppler of the left arm negative for DVT.  History of IV drug use -Dr. Damita Dunnings managing Suboxone twice daily -No high-dose  IV benzos unless required for seizures, no extra narcotics  Malnutrition -Continue nutritional supplements, dietitian was consulted  Generalized anxiety disorder -Psych was consulted, continue Zoloft 50 mg daily -May continue Klonopin as needed for 2 weeks and then wean off, not advised for long-term. -Psych was reconsulted for follow-up recommended increasing the Zoloft and tapering off the Klonopin  Seizures -Likely due to drug use, continue seizure precautions, as needed Ativan  -MRI brain negative for any abscess  Iron deficiency anemia -H&H currently stable, continue iron  supplementation  DVT right gastrocnemius vein with superficial saphenous thrombophlebitis -Recommended therapeutic Lovenox, patient refusing Lovenox at times  Thrombocytopenia -Resolved  Hyponatremia, hypokalemia -Resolved  Hepatitis C antibody positive -ID following  Vitamin D deficiency - Continue supplementation  Constipation -Added mag citrate, continue Senokot,   Code Status: full  DVT Prophylaxis:  Lovenox   Family Communication: Discussed in detail with the patient, all imaging results, lab results explained to the patient    Disposition Plan: Will stay inpatient for the duration of IV antibiotics.     Time Spent in minutes 15 minutes  Procedures:   I&D of L knee on 09/23/17  I &D of bilateral hip on 10/04/17    Consultants:   Infectious disease Orthopedics  Antimicrobials:  IV Ancef till 11/05/17  Medications  Scheduled Meds: . bisacodyl  10 mg Rectal Once  . buprenorphine-naloxone  1 tablet Sublingual BID  . enoxaparin (LOVENOX) injection  90 mg Subcutaneous Q24H  . famotidine  20 mg Oral Daily  . feeding supplement (ENSURE ENLIVE)  237 mL Oral TID BM  . ferrous sulfate  325 mg Oral BID WC  . hydrocortisone cream   Topical BID  . magnesium citrate  1 Bottle Oral Once  . multivitamin with minerals  1 tablet Oral Daily  . nicotine  14 mg Transdermal Daily  . polyethylene glycol  17 g Oral BID  . senna-docusate  1 tablet Oral BID  . sertraline  50 mg Oral Daily  . sodium chloride flush  10-40 mL Intracatheter Q12H  . Vitamin D (Ergocalciferol)  50,000 Units Oral Q Mon   Continuous Infusions: .  ceFAZolin (ANCEF) IV 2 g (10/14/17 0552)  . lactated ringers 10 mL/hr at 09/23/17 1329  . lactated ringers 10 mL/hr at 10/04/17 1237   PRN Meds:.acetaminophen, alum & mag hydroxide-simeth, clonazePAM, hydrOXYzine, ondansetron **OR** ondansetron (ZOFRAN) IV, oxyCODONE, sodium chloride, sodium chloride flush, sodium chloride flush,  traZODone   Antibiotics   Anti-infectives (From admission, onward)   Start     Dose/Rate Route Frequency Ordered Stop   09/23/17 0600  ceFAZolin (ANCEF) IVPB 2g/100 mL premix     2 g 200 mL/hr over 30 Minutes Intravenous To Surgery 09/22/17 1956 09/23/17 1436   09/23/17 0600  ceFAZolin (ANCEF) IVPB 2g/100 mL premix  Status:  Discontinued     2 g 200 mL/hr over 30 Minutes Intravenous On call to O.R. 09/22/17 1956 09/22/17 2002   09/15/17 1500  Oritavancin Diphosphate (ORBACTIV) 1,200 mg in dextrose 5 % IVPB     1,200 mg 333.3 mL/hr over 180 Minutes Intravenous Once 09/15/17 1357 09/15/17 2100   09/12/17 1400  ceFAZolin (ANCEF) IVPB 2g/100 mL premix     2 g 200 mL/hr over 30 Minutes Intravenous Every 8 hours 09/12/17 1300 11/05/17 2359   09/11/17 2100  vancomycin (VANCOCIN) IVPB 750 mg/150 ml premix  Status:  Discontinued     750 mg 150 mL/hr over 60 Minutes Intravenous Every 8 hours 09/11/17 2015  09/12/17 1300        Subjective:   Gary Hicks was seen and examined today.  Today feeling nauseous, states no chest pain or shortness of breath.  States having some constipation issues.  No fevers or chills.   No chest pain or shortness of breath, abdominal pain any nausea vomiting.    Objective:   Vitals:   10/13/17 1806 10/13/17 2210 10/14/17 0557 10/14/17 1002  BP: 98/77 94/62 95/72  103/73  Pulse: (!) 105 99 (!) 109 (!) 113  Resp: 18 18 18 18   Temp: 98 F (36.7 C) 98.1 F (36.7 C) 98.4 F (36.9 C) 97.9 F (36.6 C)  TempSrc: Oral Oral Oral Oral  SpO2: 94% 96% 94% 98%  Weight:      Height:        Intake/Output Summary (Last 24 hours) at 10/14/2017 1418 Last data filed at 10/14/2017 1003 Gross per 24 hour  Intake 1120 ml  Output 950 ml  Net 170 ml     Wt Readings from Last 3 Encounters:  10/12/17 60.3 kg (133 lb)  09/10/17 63.5 kg (140 lb)  01/05/15 63.5 kg (140 lb)     Exam  General: Alert and oriented x 3, NAD Eyes:  HEENT:   Cardiovascular: S1 S2  auscultated, Regular rate and rhythm. No pedal edema b/l Respiratory: Clear to auscultation bilaterally, no wheezing, rales or rhonchi Gastrointestinal: Soft, nontender, nondistended, + bowel sounds Ext: no pedal edema bilaterally Neuro: no new deficits Musculoskeletal: No digital cyanosis, clubbing Skin: tattoo Psych: Normal affect and demeanor, alert and oriented x3      Data Reviewed:  I have personally reviewed following labs and imaging studies  Micro Results Recent Results (from the past 240 hour(s))  Urine Culture     Status: Abnormal   Collection Time: 10/06/17  5:46 PM  Result Value Ref Range Status   Specimen Description URINE, CLEAN CATCH  Final   Special Requests NONE  Final   Culture (A)  Final    <10,000 COLONIES/mL Performed at [facility], [address], [place], [place] [address]    Report Status 10/08/2017 FINAL  Final  Culture, blood (routine x 2)     Status: None   Collection Time: 10/06/17  8:46 PM  Result Value Ref Range Status   Specimen Description BLOOD LEFT FOREARM  Final   Special Requests IN PEDIATRIC BOTTLE Blood Culture adequate volume  Final   Culture   Final    NO GROWTH 5 DAYS Performed at [facility], [place] [address] [place], [place] [address]    Report Status 10/11/2017 FINAL  Final  Culture, blood (routine x 2)     Status: None   Collection Time: 10/06/17  8:58 PM  Result Value Ref Range Status   Specimen Description BLOOD LEFT FOREARM  Final   Special Requests IN PEDIATRIC BOTTLE Blood Culture adequate volume  Final   Culture   Final    NO GROWTH 5 DAYS Performed at [facility], [place] [address] [place], [place] [address]    Report Status 10/11/2017 FINAL  Final    Radiology Reports Dg Chest 2 View  Result Date: 09-13-202019 CLINICAL DATA:  Chest pain. EXAM: CHEST - 2 VIEW COMPARISON:  Radiograph of October 06, 2017. FINDINGS: The heart size and mediastinal contours are within normal limits. No pneumothorax is  noted. Right lung is clear. Right-sided PICC line is unchanged in position. Stable large left pleural effusion is noted with  probable underlying atelectasis or infiltrate. The visualized skeletal structures are unremarkable. IMPRESSION: Stable large left pleural effusion is noted with probable underlying atelectasis or infiltrate. Electronically Signed   By: Marijo Conception, M.D.   On: August 23, 202019 15:53   Ct Angio Chest Pe W Or Wo Contrast  Result Date: 09/25/2017 CLINICAL DATA:  32 year old male with LEFT LOWER extremity thrombosis. IV drug abuser. Generalized pain. Fever chills and myalgias. EXAM: CT ANGIOGRAPHY CHEST WITH CONTRAST TECHNIQUE: Multidetector CT imaging of the chest was performed using the standard protocol during bolus administration of intravenous contrast. Multiplanar CT image reconstructions and MIPs were obtained to evaluate the vascular anatomy. CONTRAST:  138mL ISOVUE-370 IOPAMIDOL (ISOVUE-370) INJECTION 76% COMPARISON:  06/06/2016 and prior chest radiographs FINDINGS: Cardiovascular: This is a technically satisfactory study. No pulmonary emboli are identified. Old healed UPPER limits normal heart size noted. No evidence of thoracic aortic aneurysm or pericardial effusion. Mediastinum/Nodes: No enlarged mediastinal, hilar, or axillary lymph nodes. Thyroid gland, trachea, and esophagus demonstrate no significant findings. Lungs/Pleura: There are multiple irregular bilateral pulmonary nodules with index lesions as follows: An 8 x 13 mm SUPERIOR segment RIGHT LOWER lobe nodule (6:42) A 4 x 7 mm RIGHT UPPER lobe nodule (6:56) A 12 x 20 mm RIGHT LOWER lobe nodule (6:88) A 28 x 22 mm LEFT LOWER lobe nodule (6:91). These nodules are nonspecific but most likely represent infection or possibly septic emboli. Malignancy/metastatic disease is considered less likely. A small LEFT pleural effusion and LEFT basilar atelectasis noted. Upper Abdomen: No acute abnormality. Musculoskeletal: No acute  abnormality or suspicious bony lesion. Review of the MIP images confirms the above findings. IMPRESSION: 1. Multiple bilateral pulmonary nodules as described. Favor infection or possibly septic emboli. Other inflammatory processes or malignancy/metastatic disease considered less likely. 2. Small LEFT pleural effusion and LEFT basilar atelectasis 3. No evidence of pulmonary emboli or thoracic aortic aneurysm. Electronically Signed   By: Margarette Canada M.D.   On: 09/25/2017 18:42   Mr Jeri Cos DU Contrast  Result Date: 09/18/2017 CLINICAL DATA:  Endocarditis with muscle weakness and right hand numbness. EXAM: MRI HEAD WITHOUT AND WITH CONTRAST TECHNIQUE: Multiplanar, multiecho pulse sequences of the brain and surrounding structures were obtained without and with intravenous contrast. CONTRAST:  61mL MULTIHANCE GADOBENATE DIMEGLUMINE 529 MG/ML IV SOLN COMPARISON:  Head CT 09/12/2017 FINDINGS: Brain: The midline structures are normal. There is no acute infarct or acute hemorrhage. No mass lesion, hydrocephalus, dural abnormality or extra-axial collection. Focus of hyperintense T2-weighted signal within the left frontal periventricular white matter. Otherwise normal parenchymal signal. There is atrophy greater than expected for age with ex vacuo dilatation of the lateral ventricles. 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 No chronic microhemorrhage or superficial siderosis. Vascular: Major intracranial arterial and venous sinus flow voids are preserved. Skull and upper cervical spine: The visualized skull base, calvarium, upper cervical spine and extracranial soft tissues are normal. Sinuses/Orbits: No fluid levels or advanced mucosal thickening. No mastoid or middle ear effusion. Normal orbits. IMPRESSION: Age advanced volume loss with ex vacuo dilatation of the ventricles, but no acute abnormality. Electronically Signed   By: Ulyses Jarred M.D.   On: 09/18/2017 02:30   Mr Cervical Spine Wo Contrast  Result  Date: 09/20/2017 CLINICAL DATA:  Generalized pain, hand numbness and weakness. History of endocarditis and intravenous drug abuse. EXAM: MRI CERVICAL SPINE WITHOUT CONTRAST TECHNIQUE: Multiplanar, multisequence MR imaging of the cervical spine was performed. No intravenous contrast was administered. COMPARISON:  MRI of the head  September 18, 2017 FINDINGS: ALIGNMENT: Straightened cervical lordosis.  No malalignment. VERTEBRAE/DISCS: Vertebral bodies are intact. Intervertebral disc morphology's and signal are normal. Mild C6-7 ventral endplate spurring. No abnormal or acute bone marrow signal. CORD:Cervical spinal cord is normal morphology and signal characteristics from the cervicomedullary junction to level of T1-2, the most caudal well visualized level. POSTERIOR FOSSA, VERTEBRAL ARTERIES, PARASPINAL TISSUES: No MR findings of ligamentous injury. Vertebral artery flow voids present. Included posterior fossa and paraspinal soft tissues are normal. 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 DISC LEVELS (axial sequences mild to moderately motion degraded): C2-3 through C7-T1: No disc bulge, canal stenosis nor neural foraminal narrowing. Mild C3-4 through C5-6 facet arthropathy. IMPRESSION: 1. Motion degraded examination. 2. No fracture, malalignment or acute osseous process. No MR findings of infection by noncontrast MRI. 3. Early degenerative change of the cervical spine without canal stenosis or neural foraminal narrowing. Electronically Signed   By: Elon Alas M.D.   On: 09/20/2017 23:16   Mr Hip Right W Wo Contrast  Result Date: 10/02/2017 CLINICAL DATA:  Bilateral hip and back pain.  Left worse than right. EXAM: MRI OF THE LEFT HIP WITHOUT AND WITH CONTRAST TECHNIQUE: Multiplanar, multisequence MR imaging was performed both before and after administration of intravenous contrast. CONTRAST:  79mL MULTIHANCE GADOBENATE DIMEGLUMINE 529 MG/ML IV SOLN COMPARISON:  None. FINDINGS: Bones: No  hip fracture, dislocation or avascular necrosis. Marrow edema in the left femoral head and acetabulum. Mild marrow edema in the anterior right acetabulum. Bilateral pericapsular edema and enhancement. No periosteal reaction or bone destruction. No aggressive osseous lesion. Normal sacrum and sacroiliac joints. No SI joint widening or erosive changes. Articular cartilage and labrum Articular cartilage: Partial-thickness bilateral femoral heads and acetabulum, left greater than right. Small femoral inferior marginal osteophytes. Labrum: Grossly intact, but evaluation is limited by lack of intraarticular fluid. Joint or bursal effusion Joint effusion: Large left hip joint effusion with severe synovitis and enhancement. Small right hip joint effusion with synovitis and enhancement. No SI joint effusion. Bursae:  No bursa formation. Muscles and tendons Muscle edema in the left obturator internus muscle with a 13 mm fluid collection posteriorly within the muscle concerning for a small abscess. Muscle edema and mild enhancement in the adductor musculature bilaterally and right iliacus muscle. Muscle edema and enhancement of bilateral multifidus muscles. Other findings Miscellaneous: No pelvic free fluid. No fluid collection or hematoma. No inguinal lymphadenopathy. No inguinal hernia. IMPRESSION: 1. Large left hip joint effusion with enhancing synovitis, pericapsular edema and severe marrow edema on either side of the joint most concerning for septic arthritis. 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 2. Small right hip joint effusion with enhancing synovitis and pericapsular edema and enhancement most concerning for septic arthritis. 3. Muscle edema within the adductor musculature bilaterally and multifidus muscles bilaterally most concerning for infectious myositis. 13 mm fluid collection in the posterior aspect of the left obturator internus muscle most concerning for a small abscess.  Electronically Signed   By: Kathreen Devoid   On: 10/02/2017 11:36   Mr Hip Left W Wo Contrast  Result Date: 10/02/2017 CLINICAL DATA:  Bilateral hip and back pain.  Left worse than right. EXAM: MRI OF THE LEFT HIP WITHOUT AND WITH CONTRAST TECHNIQUE: Multiplanar, multisequence MR imaging was performed both before and after administration of intravenous contrast. CONTRAST:  60mL MULTIHANCE GADOBENATE DIMEGLUMINE 529 MG/ML IV SOLN COMPARISON:  None. FINDINGS: Bones: No hip fracture, dislocation or avascular necrosis. Marrow edema in  the left femoral head and acetabulum. Mild marrow edema in the anterior right acetabulum. Bilateral pericapsular edema and enhancement. No periosteal reaction or bone destruction. No aggressive osseous lesion. Normal sacrum and sacroiliac joints. No SI joint widening or erosive changes. Articular cartilage and labrum Articular cartilage: Partial-thickness bilateral femoral heads and acetabulum, left greater than right. Small femoral inferior marginal osteophytes. Labrum: Grossly intact, but evaluation is limited by lack of intraarticular fluid. Joint or bursal effusion Joint effusion: Large left hip joint effusion with severe synovitis and enhancement. Small right hip joint effusion with synovitis and enhancement. No SI joint effusion. Bursae:  No bursa formation. Muscles and tendons Muscle edema in the left obturator internus muscle with a 13 mm fluid collection posteriorly within the muscle concerning for a small abscess. Muscle edema and mild enhancement in the adductor musculature bilaterally and right iliacus muscle. Muscle edema and enhancement of bilateral multifidus muscles. Other findings Miscellaneous: No pelvic free fluid. No fluid collection or hematoma. No inguinal lymphadenopathy. No inguinal hernia. IMPRESSION: 1. Large left hip joint effusion with enhancing synovitis, pericapsular edema and severe marrow edema on either side of the joint most concerning for septic  arthritis. 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 2. Small right hip joint effusion with enhancing synovitis and pericapsular edema and enhancement most concerning for septic arthritis. 3. Muscle edema within the adductor musculature bilaterally and multifidus muscles bilaterally most concerning for infectious myositis. 13 mm fluid collection in the posterior aspect of the left obturator internus muscle most concerning for a small abscess. Electronically Signed   By: Kathreen Devoid   On: 10/02/2017 11:36   Mr Shoulder Right Wo Contrast  Result Date: 09/15/2017 CLINICAL DATA:  Fevers, chills, generalized myalgias, and polyarthralgia, involving the bilateral shoulders, hips, knees, and feet. History of IV drug abuse. EXAM: MRI OF THE RIGHT SHOULDER WITHOUT CONTRAST TECHNIQUE: Multiplanar, multisequence MR imaging of the shoulder was performed. No intravenous contrast was administered. COMPARISON:  Right shoulder x-rays dated September 11, 2017. FINDINGS: Despite efforts by the technologist and patient, motion artifact is present on today's exam and could not be eliminated. This reduces exam sensitivity and specificity. Rotator cuff: Intact supraspinatus, infraspinatus, teres minor and subscapularis tendons. Muscles: Mild edema within the supraspinatus muscle and along the posterior aspect of the infraspinatus muscle. No muscle atrophy. Biceps long head:  Intact and normally positioned. Acromioclavicular Joint: Normal acromioclavicular joint. Type II acromion. Small subacromial/subdeltoid bursal fluid. Glenohumeral Joint: No joint effusion. No chondral defect. Labrum: Grossly intact, but evaluation is limited by lack of intraarticular fluid. Bones:  No marrow abnormality, fracture or dislocation. Other: None. IMPRESSION: 1. Mild supraspinatus and infraspinatus muscle edema suggestive of myositis. 2. Mild subacromial/subdeltoid bursitis. 3. No drainable fluid collection.  No glenohumeral  joint effusion. Electronically Signed   By: Titus Dubin M.D.   On: 09/15/2017 08:22   Mr Knee Left Wo Contrast  Result Date: 09/21/2017 CLINICAL DATA:  32 year old man with active IV drug abuse, who presents with generalized pain. History is limited secondary to his acute illness including pain and fluctuations in alertness EXAM: MRI OF THE LEFT KNEE WITHOUT CONTRAST TECHNIQUE: Multiplanar, multisequence MR imaging of the knee was performed. No intravenous contrast was administered. COMPARISON:  None. FINDINGS: MENISCI Medial meniscus:  Intact. Lateral meniscus:  Intact. LIGAMENTS Cruciates:  Intact ACL and PCL. Collaterals: Medial collateral ligament is intact. Lateral collateral ligament complex is intact. CARTILAGE Patellofemoral:  No chondral defect. Medial:  No chondral defect. Lateral:  No chondral  defect. Joint: No joint effusion. Normal Hoffa's fat. No plical thickening. Popliteal Fossa: No Baker's cyst. Intact popliteus tendon. Mild soft tissue edema superficial to the lateral gastrocnemius muscle. Mild soft tissue edema deep to the distal biceps femoris muscle. Extensor Mechanism: Intact quadriceps tendon. Intact patellar tendon. Intact medial patellar retinaculum. Intact lateral patellar retinaculum. Intact MPFL. Bones: No focal marrow signal abnormality. No fracture or dislocation. Other: 4.8 x 0.8 x 4 cm complex fluid collection in the subcutaneous fat anterior to the patellar tendon with severe surrounding soft tissue edema most concerning for bursitis which may be secondarily infected. IMPRESSION: 1. 4.8 x 0.8 x 4 cm complex fluid collection in the subcutaneous fat anterior to the patellar tendon with severe surrounding soft tissue edema most concerning for bursitis which may be secondarily infected. 2. No evidence of septic arthritis. Electronically Signed   By: Kathreen Devoid   On: 09/21/2017 09:02   Dg Chest Port 1 View  Result Date: 10/06/2017 CLINICAL DATA:  Fever and left upper chest  pain for 1 week. EXAM: PORTABLE CHEST 1 VIEW COMPARISON:  Chest CTA 09/25/2017 FINDINGS: A right PICC terminates over the high right atrium. The cardiac silhouette is normal in size. Veiling opacity in the left mid and lower hemithorax is consistent with a persistent, small to moderate-sized pleural effusion. Mild parenchymal opacity in the left lung base likely represents atelectasis. No right lung consolidation is seen. Nodular right lung opacities on the prior CT are not clearly apparent on this radiograph. No pneumothorax is identified. No acute osseous abnormality is seen. IMPRESSION: Left pleural effusion and mild left basilar atelectasis. Electronically Signed   By: Logan Bores M.D.   On: 10/06/2017 11:12   [place] Soft Tissue Non Vascular  Result Date: 09/19/2017 CLINICAL DATA:  Leg pain EXAM: ULTRASOUND RIGHT LOWER EXTREMITY LIMITED TECHNIQUE: Ultrasound examination of the lower extremity soft tissues was performed in the area of clinical concern. COMPARISON:  Four days prior FINDINGS: Medial distal thigh great saphenous vein thrombosis that appears complete. The wall appears thickened as well. A smaller and more superficial subcutaneous vein is also thrombosed. There is regional subcutaneous reticulation. No neighboring abscess. IMPRESSION: Great saphenous vein thrombosis in the lower right thigh. A smaller neighboring subcutaneous vein is also thrombosed. Probable underlying thrombophlebitis. Consider DVT study to evaluate proximal extent. Electronically Signed   By: Monte Fantasia M.D.   On: 09/19/2017 09:47   Dg Hips Bilat With Pelvis 2v  Result Date: 10/01/2017 CLINICAL DATA:  Hip pain. EXAM: DG HIP (WITH OR WITHOUT PELVIS) 2V BILAT COMPARISON:  None. FINDINGS: There are degenerative changes in the left hip with an osteophyte off the femoral neck. No significant loss of joint space. No fractures or dislocations identified. No other acute abnormalities. 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  No other abnormalities. Electronically Signed   By: Dorise Bullion III M.D   On: 10/01/2017 12:46   [place] Ekg Site Rite  Result Date: 09/25/2017 If Site Rite image not attached, placement could not be confirmed due to current cardiac rhythm.   Lab Data:  CBC: Recent Labs  Lab 10/08/17 0452 10/12/17 0459 10/14/17 0422  WBC 8.9 9.3 12.4*  NEUTROABS 6.0 6.6  --   HGB 8.5* 8.9* 9.3*  HCT 25.8* 27.8* 28.7*  MCV 94.2 94.6 92.6  PLT 286 297 409   Basic Metabolic Panel: Recent Labs  Lab 10/08/17 0452 10/12/17 0459 10/14/17 0422  NA 135 135 133*  K  3.9 3.9 4.1  CL 96* 98* 95*  CO2 30 28 30   GLUCOSE 103* 112* 115*  BUN 5* 7 8  CREATININE 0.48* 0.61 0.59*  CALCIUM 8.3* 8.7* 8.9   GFR: Estimated Creatinine Clearance: 114.1 mL/min (A) (by C-G formula based on SCr of 0.59 mg/dL (L)). Liver Function Tests: No results for input(s): AST, ALT, ALKPHOS, BILITOT, PROT, ALBUMIN in the last 168 hours. No results for input(s): LIPASE, AMYLASE in the last 168 hours. No results for input(s): AMMONIA in the last 168 hours. Coagulation Profile: No results for input(s): INR, PROTIME in the last 168 hours. Cardiac Enzymes: Recent Labs  Lab 10/13/17 1746  TROPONINI <0.03   BNP (last 3 results) No results for input(s): PROBNP in the last 8760 hours. HbA1C: No results for input(s): HGBA1C in the last 72 hours. CBG: No results for input(s): GLUCAP in the last 168 hours. Lipid Profile: No results for input(s): CHOL, HDL, LDLCALC, TRIG, CHOLHDL, LDLDIRECT in the last 72 hours. Thyroid Function Tests: No results for input(s): TSH, T4TOTAL, FREET4, T3FREE, THYROIDAB in the last 72 hours. Anemia Panel: No results for input(s): VITAMINB12, FOLATE, FERRITIN, TIBC, IRON, RETICCTPCT in the last 72 hours. Urine analysis:    Component Value Date/Time   COLORURINE YELLOW 10/06/2017 [place] 10/06/2017 1746   LABSPEC 1.017 10/06/2017 1746    PHURINE 7.0 10/06/2017 1746   GLUCOSEU NEGATIVE 10/06/2017 1746   HGBUR MODERATE (A) 10/06/2017 1746   BILIRUBINUR NEGATIVE 10/06/2017 1746   KETONESUR NEGATIVE 10/06/2017 1746   PROTEINUR NEGATIVE 10/06/2017 1746   UROBILINOGEN 0.2 05/16/2012 0847   NITRITE NEGATIVE 10/06/2017 1746   LEUKOCYTESUR NEGATIVE 10/06/2017 1746     Sareena Odeh M.D. [facility] 10/14/2017, 2:18 PM  Pager: [phone] Between 7am to 7pm - call Pager - [phone]  After 7pm go to [email or web address] - password [email or web address]  Call night coverage person covering after 7pm

## 2017-10-14 NOTE — Progress Notes (Addendum)
Offered to take patient's sutures out twice today, both times he declined saying, "Let's do it later". Made patient aware that there was an order for them to be removed so it needs to happen today, pt said OK and he will let nurse know when he is ready.

## 2017-10-14 NOTE — Progress Notes (Signed)
Patient refused to go down for CT scan at 1230. He stated "I have a visitor coming at 2:30pm." I told him it would only take about 23mins. He stated, "Well I can't go down there with my stomach feeling upset." Asked patient if he was nausea, throwing up or having diarrhea and he denied all three. He stated he will go tomorrow. Paged Dr. Tana Coast and let her know.

## 2017-10-14 NOTE — Progress Notes (Signed)
Patient received pain meds but continues to refuse  Ct scan.Reviewed with patient the importance of procedure but continues to refuse.  Pt stated he will go tomorrow.so refused today am shift also

## 2017-10-14 NOTE — Progress Notes (Signed)
PT Cancellation Note  Patient Details Name: Gary Hicks MRN: [id number] DOB: [birth year]   Cancelled Treatment:    Reason Eval/Treat Not Completed: Other (comment); patient reports abdominal pain and planning for a family visit later this pm.  Will attempt another [place].   Reginia Naas 10/14/2017, 4:18 PM  Magda Kiel, [place] 10/14/2017

## 2017-10-15 ENCOUNTER — Inpatient Hospital Stay (HOSPITAL_COMMUNITY): Payer: Self-pay

## 2017-10-15 ENCOUNTER — Encounter (HOSPITAL_COMMUNITY): Payer: Self-pay

## 2017-10-15 MED ORDER — IOPAMIDOL (ISOVUE-370) INJECTION 76%
INTRAVENOUS | Status: AC
  Administered 2017-10-15: 100 mL
  Filled 2017-10-15: qty 100

## 2017-10-15 NOTE — Progress Notes (Signed)
[facility]                                                                              Patient Demographics  Gary Hicks, is a 32 y.o. male, DOB - 1985-09-02, GHW:[id number]  Admit date - 09/11/2017   Admitting Physician Quintella Baton, MD  Outpatient Primary MD for the patient is Patient, [name] Per  Outpatient specialists:   LOS - 34  days   Medical records reviewed and are as summarized below:    Chief Complaint  Patient presents with  . Weakness  . Leg Pain       Brief summary   32 year old male, active IVDA, who presents with generalized pain. Patient was incarcerated and released 3-4 weeks ago. Patient admits using IV methamphetamine as well as heroin in the past 24 hours prior to admission. Reports global polyarthralgias including his bilateral feet, bilateral knees, bilateral hips and shoulders. Also reports fevers and chills generalized myalgias. Pt admitted for further management. 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 Pt was also diagnosed with DVT Right gastrocnemius vein . He was started on Lovenox 2/18.He was also diagnosed with left knee septic infection. He underwent bursectomy and I and D by Dr Percell Miller 2-25.He reported worsening left hip pain 3-02, MRI showed, B/L hipSeptic arthritis and 13 mm fluid collection in the posterior aspect of the left obturator internus muscle most concerning for a small abscess.IR was consulted for arthrocentesis, patient couldn't tolerate procedure. Dr Percell Miller performed I and D of bilateral hip on 3/5.   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  Blood cultures 2/11 grew MSSA, repeat on 2/15 and 3/7 have been negative -2D echo showed large 1.1 cmx 1.8 cm multilobulated highly mobile vegetation on the right ventricular aspect of  the anterior leaflet of the tricuspid valve.  Moderate to severe tricuspid regurgitation. -Infectious disease was consulted, recommended continue IV Ancef for 6 weeks from 2/23 till 11/05/17 -History of IV drug use, currently on Suboxone, difficult placement hence patient will stay inpatient for the duration of antibiotics -Complaining of nausea and constipation, refused CTA of the chest  Septic left knee, bilateral hip joints -MRI of the left knee showed 4.8 x0.8x 4 cm fluid collection in the subcutaneous fat anterior to patella tendon with severe surrounding soft tissue edema concerning for septic arthritis. -MRI hip showed septic arthritis and 13 mm fluid collection in the posterior aspect of the left obturator internus muscle concerning for small abscess -Orthopedics was consulted (Dr Percell Miller).  Status post I&D and bursectomy of the left knee prepatellar bursitis on 09/23/17, status post I&D of septic arthritis bilateral hip on 10/04/17 -Cultures intraoperatively negative -MRI brain negative for abscess, Doppler of the left arm negative for DVT.  History of IV drug use -Dr. Damita Dunnings managing Suboxone twice daily -No high-dose IV benzos unless required for seizures, no extra narcotics  Malnutrition -Continue nutritional supplements, dietitian was consulted  Generalized anxiety disorder -Psych was consulted, continue Zoloft 50 mg daily -May  continue Klonopin as needed for 2 weeks and then wean off, not advised for long-term. -Psych was reconsulted for follow-up recommended increasing the Zoloft and tapering off the Klonopin  Seizures -Likely due to drug use, continue seizure precautions, as needed Ativan  -MRI brain negative for any abscess  Iron deficiency anemia -H&H currently stable, continue iron supplementation  DVT right gastrocnemius vein with superficial saphenous thrombophlebitis -Recommended therapeutic Lovenox, patient refusing Lovenox at  times  Thrombocytopenia -Resolved  Hyponatremia, hypokalemia -Resolved  Hepatitis C antibody positive -ID following  Vitamin D deficiency - Continue supplementation  Constipation -States constipated, hence feels nauseous however did not drink mag citrate.  -Continue Senokot-S, MiraLAX 17 g BID  Code Status: full  DVT Prophylaxis:  Lovenox   Family Communication: Discussed in detail with the patient, all imaging results, lab results explained to the patient    Disposition Plan: Will stay inpatient for the duration of IV antibiotics.     Time Spent in minutes 15 minutes  Procedures:   I&D of L knee on 09/23/17  I &D of bilateral hip on 10/04/17    Consultants:   Infectious disease Orthopedics  Antimicrobials:  IV Ancef till 11/05/17  Medications  Scheduled Meds: . bisacodyl  10 mg Rectal Once  . buprenorphine-naloxone  1 tablet Sublingual BID  . enoxaparin (LOVENOX) injection  90 mg Subcutaneous Q24H  . famotidine  20 mg Oral Daily  . feeding supplement (ENSURE ENLIVE)  237 mL Oral TID BM  . ferrous sulfate  325 mg Oral BID WC  . hydrocortisone cream   Topical BID  . multivitamin with minerals  1 tablet Oral Daily  . nicotine  14 mg Transdermal Daily  . polyethylene glycol  17 g Oral BID  . senna-docusate  1 tablet Oral BID  . sertraline  50 mg Oral Daily  . sodium chloride flush  10-40 mL Intracatheter Q12H  . Vitamin D (Ergocalciferol)  50,000 Units Oral Q Mon   Continuous Infusions: .  ceFAZolin (ANCEF) IV Stopped (10/15/17 0721)  . lactated ringers 10 mL/hr at 09/23/17 1329  . lactated ringers 10 mL/hr at 10/04/17 1237   PRN Meds:.acetaminophen, alum & mag hydroxide-simeth, clonazePAM, hydrOXYzine, ondansetron **OR** ondansetron (ZOFRAN) IV, oxyCODONE, sodium chloride, sodium chloride flush, sodium chloride flush, traZODone   Antibiotics   Anti-infectives (From admission, onward)   Start     Dose/Rate Route Frequency Ordered Stop   09/23/17 0600   ceFAZolin (ANCEF) IVPB 2g/100 mL premix     2 g 200 mL/hr over 30 Minutes Intravenous To Surgery 09/22/17 1956 09/23/17 1436   09/23/17 0600  ceFAZolin (ANCEF) IVPB 2g/100 mL premix  Status:  Discontinued     2 g 200 mL/hr over 30 Minutes Intravenous On call to O.R. 09/22/17 1956 09/22/17 2002   09/15/17 1500  Oritavancin Diphosphate (ORBACTIV) 1,200 mg in dextrose 5 % IVPB     1,200 mg 333.3 mL/hr over 180 Minutes Intravenous Once 09/15/17 1357 09/15/17 2100   09/12/17 1400  ceFAZolin (ANCEF) IVPB 2g/100 mL premix     2 g 200 mL/hr over 30 Minutes Intravenous Every 8 hours 09/12/17 1300 11/05/17 2359   09/11/17 2100  vancomycin (VANCOCIN) IVPB 750 mg/150 ml premix  Status:  Discontinued     750 mg 150 mL/hr over 60 Minutes Intravenous Every 8 hours 09/11/17 2015 09/12/17 1300        Subjective:   Gary Hicks was seen and examined today.  States still constipated, feels nauseous, no chest pain.  No fevers or chills.  No shortness of breath.    Objective:   Vitals:   10/14/17 1809 10/14/17 2058 10/15/17 0602 10/15/17 1002  BP: 101/68 100/65 97/64 94/66   Pulse: (!) 108 98 (!) 104 (!) 107  Resp: 18 19 20 18   Temp: 98.3 F (36.8 C) 98.8 F (37.1 C) 98.8 F (37.1 C) 98.3 F (36.8 C)  TempSrc: Oral Oral Oral Oral  SpO2: 96% 100% 100% 97%  Weight:  60.2 kg (132 lb 11.5 oz)    Height:        Intake/Output Summary (Last 24 hours) at 10/15/2017 1315 Last data filed at 10/15/2017 0651 Gross per 24 hour  Intake 2978.51 ml  Output 600 ml  Net 2378.51 ml     Wt Readings from Last 3 Encounters:  10/14/17 60.2 kg (132 lb 11.5 oz)  09/10/17 63.5 kg (140 lb)  01/05/15 63.5 kg (140 lb)     Exam  General: Alert and oriented x 3, NAD Eyes:  HEENT:  Cardiovascular: S1 S2 auscultated, sinus tachycardia, regular rate and rhythm. No pedal edema b/l Respiratory: Clear to auscultation bilaterally, no wheezing, rales or rhonchi Gastrointestinal: Soft, nontender, nondistended, + bowel  sounds Ext: no pedal edema bilaterally Neuro: no new deficits Musculoskeletal: No digital cyanosis, clubbing Skin: No rashes Psych: Normal affect and demeanor, alert and oriented x3    Data Reviewed:  I have personally reviewed following labs and imaging studies  Micro Results Recent Results (from the past 240 hour(s))  Urine Culture     Status: Abnormal   Collection Time: 10/06/17  5:46 PM  Result Value Ref Range Status   Specimen Description URINE, CLEAN CATCH  Final   Special Requests NONE  Final   Culture (A)  Final    <10,000 COLONIES/mL Performed at [facility], [address], [place], [place] [address]    Report Status 10/08/2017 FINAL  Final  Culture, blood (routine x 2)     Status: None   Collection Time: 10/06/17  8:46 PM  Result Value Ref Range Status   Specimen Description BLOOD LEFT FOREARM  Final   Special Requests IN PEDIATRIC BOTTLE Blood Culture adequate volume  Final   Culture   Final    NO GROWTH 5 DAYS Performed at [facility], [place] [address] [place], [place] [address]    Report Status 10/11/2017 FINAL  Final  Culture, blood (routine x 2)     Status: None   Collection Time: 10/06/17  8:58 PM  Result Value Ref Range Status   Specimen Description BLOOD LEFT FOREARM  Final   Special Requests IN PEDIATRIC BOTTLE Blood Culture adequate volume  Final   Culture   Final    NO GROWTH 5 DAYS Performed at [facility], [place] [address] [place], [place] [address]    Report Status 10/11/2017 FINAL  Final    Radiology Reports Dg Chest 2 View  Result Date: 2020-03-1918 CLINICAL DATA:  Chest pain. EXAM: CHEST - 2 VIEW COMPARISON:  Radiograph of October 06, 2017. FINDINGS: The heart size and mediastinal contours are within normal limits. No pneumothorax is noted. Right lung is clear. Right-sided PICC line is unchanged in position. Stable large left pleural effusion is noted with probable underlying atelectasis or infiltrate. The visualized skeletal  structures are unremarkable. IMPRESSION: Stable large left pleural effusion is noted with probable underlying atelectasis or infiltrate. Electronically Signed   By: Marijo Conception, M.D.   On: 02020-03-1918 15:53  Ct Angio Chest Pe W Or Wo Contrast  Result Date: 10/15/2017 CLINICAL DATA:  Evaluate for pulmonary embolus. Shortness of breath. EXAM: CT ANGIOGRAPHY CHEST WITH CONTRAST TECHNIQUE: Multidetector CT imaging of the chest was performed using the standard protocol during bolus administration of intravenous contrast. Multiplanar CT image reconstructions and MIPs were obtained to evaluate the vascular anatomy. CONTRAST:  124mL ISOVUE-370 IOPAMIDOL (ISOVUE-370) INJECTION 76% COMPARISON:  Chest CT 09/25/2017. FINDINGS: Cardiovascular: Normal heart size. No pericardial effusion. Normal caliber thoracic aorta. Central venous catheter tip terminates in the superior vena cava. Adequate opacification of the pulmonary arterial system. Motion artifact limits evaluation. No filling defect identified to suggest acute pulmonary embolus. Mediastinum/Nodes: No enlarged axillary, mediastinal or hilar lymphadenopathy. Esophagus is normal in appearance. Lungs/Pleura: Central airways are patent. Endobronchial material within the mid and peripheral right lower lobe bronchi which may represent aspiration. Focal consolidation surrounding the peripheral right lower lobe bronchi. Large layering left pleural effusion. There is consolidation involving the left lower lobe. 6 mm left upper lobe nodule (image 37; series 9), in the location of a prior cyst. 7 mm cystic change right upper lobe peripherally (image 59; series 9) in the area of a prior pulmonary nodule. Previously described right lower lobe nodule is significantly decreased in size measuring 8 mm (image 107; series 8), previously 13 mm. Upper Abdomen: Reflux of contrast into the hepatic veins. No acute process. Musculoskeletal: No aggressive or acute appearing osseous  lesions. Review of the MIP images confirms the above findings. IMPRESSION: 1. No evidence of acute pulmonary embolus. 2. Interval decrease in size of many of the previously described pulmonary nodules, likely secondary to improving infectious/inflammatory process. 3. Endobronchial material within the peripheral right lower lobe bronchi with adjacent consolidation which may represent aspiration. 4. Interval increase in size of large left pleural effusion and left basilar atelectasis. 5. Reflux of contrast into the hepatic veins raising the possibility of right heart insufficiency. Electronically Signed   By: Lovey Newcomer M.D.   On: 10/15/2017 12:24   Ct Angio Chest Pe W Or Wo Contrast  Result Date: 09/25/2017 CLINICAL DATA:  32 year old male with LEFT LOWER extremity thrombosis. IV drug abuser. Generalized pain. Fever chills and myalgias. EXAM: CT ANGIOGRAPHY CHEST WITH CONTRAST TECHNIQUE: Multidetector CT imaging of the chest was performed using the standard protocol during bolus administration of intravenous contrast. Multiplanar CT image reconstructions and MIPs were obtained to evaluate the vascular anatomy. CONTRAST:  16mL ISOVUE-370 IOPAMIDOL (ISOVUE-370) INJECTION 76% COMPARISON:  06/06/2016 and prior chest radiographs FINDINGS: Cardiovascular: This is a technically satisfactory study. No pulmonary emboli are identified. Old healed UPPER limits normal heart size noted. No evidence of thoracic aortic aneurysm or pericardial effusion. Mediastinum/Nodes: No enlarged mediastinal, hilar, or axillary lymph nodes. Thyroid gland, trachea, and esophagus demonstrate no significant findings. Lungs/Pleura: There are multiple irregular bilateral pulmonary nodules with index lesions as follows: An 8 x 13 mm SUPERIOR segment RIGHT LOWER lobe nodule (6:42) A 4 x 7 mm RIGHT UPPER lobe nodule (6:56) A 12 x 20 mm RIGHT LOWER lobe nodule (6:88) A 28 x 22 mm LEFT LOWER lobe nodule (6:91). These nodules are nonspecific but most  likely represent infection or possibly septic emboli. Malignancy/metastatic disease is considered less likely. A small LEFT pleural effusion and LEFT basilar atelectasis noted. Upper Abdomen: No acute abnormality. Musculoskeletal: No acute abnormality or suspicious bony lesion. Review of the MIP images confirms the above findings. IMPRESSION: 1. Multiple bilateral pulmonary nodules as described. Favor infection or possibly septic emboli.  Other inflammatory processes or malignancy/metastatic disease considered less likely. 2. Small LEFT pleural effusion and LEFT basilar atelectasis 3. No evidence of pulmonary emboli or thoracic aortic aneurysm. Electronically Signed   By: Margarette Canada M.D.   On: 09/25/2017 18:42   Mr Jeri Cos CW Contrast  Result Date: 09/18/2017 CLINICAL DATA:  Endocarditis with muscle weakness and right hand numbness. EXAM: MRI HEAD WITHOUT AND WITH CONTRAST TECHNIQUE: Multiplanar, multiecho pulse sequences of the brain and surrounding structures were obtained without and with intravenous contrast. CONTRAST:  82mL MULTIHANCE GADOBENATE DIMEGLUMINE 529 MG/ML IV SOLN COMPARISON:  Head CT 09/12/2017 FINDINGS: Brain: The midline structures are normal. There is no acute infarct or acute hemorrhage. No mass lesion, hydrocephalus, dural abnormality or extra-axial collection. Focus of hyperintense T2-weighted signal within the left frontal periventricular white matter. Otherwise normal parenchymal signal. There is atrophy greater than expected for age with ex vacuo dilatation of the lateral ventricles. 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 No chronic microhemorrhage or superficial siderosis. Vascular: Major intracranial arterial and venous sinus flow voids are preserved. Skull and upper cervical spine: The visualized skull base, calvarium, upper cervical spine and extracranial soft tissues are normal. Sinuses/Orbits: No fluid levels or advanced mucosal thickening. No mastoid or middle ear  effusion. Normal orbits. IMPRESSION: Age advanced volume loss with ex vacuo dilatation of the ventricles, but no acute abnormality. Electronically Signed   By: Ulyses Jarred M.D.   On: 09/18/2017 02:30   Mr Cervical Spine Wo Contrast  Result Date: 09/20/2017 CLINICAL DATA:  Generalized pain, hand numbness and weakness. History of endocarditis and intravenous drug abuse. EXAM: MRI CERVICAL SPINE WITHOUT CONTRAST TECHNIQUE: Multiplanar, multisequence MR imaging of the cervical spine was performed. No intravenous contrast was administered. COMPARISON:  MRI of the head September 18, 2017 FINDINGS: ALIGNMENT: Straightened cervical lordosis.  No malalignment. VERTEBRAE/DISCS: Vertebral bodies are intact. Intervertebral disc morphology's and signal are normal. Mild C6-7 ventral endplate spurring. No abnormal or acute bone marrow signal. CORD:Cervical spinal cord is normal morphology and signal characteristics from the cervicomedullary junction to level of T1-2, the most caudal well visualized level. POSTERIOR FOSSA, VERTEBRAL ARTERIES, PARASPINAL TISSUES: No MR findings of ligamentous injury. Vertebral artery flow voids present. Included posterior fossa and paraspinal soft tissues are normal. 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 DISC LEVELS (axial sequences mild to moderately motion degraded): C2-3 through C7-T1: No disc bulge, canal stenosis nor neural foraminal narrowing. Mild C3-4 through C5-6 facet arthropathy. IMPRESSION: 1. Motion degraded examination. 2. No fracture, malalignment or acute osseous process. No MR findings of infection by noncontrast MRI. 3. Early degenerative change of the cervical spine without canal stenosis or neural foraminal narrowing. Electronically Signed   By: Elon Alas M.D.   On: 09/20/2017 23:16   Mr Hip Right W Wo Contrast  Result Date: 10/02/2017 CLINICAL DATA:  Bilateral hip and back pain.  Left worse than right. EXAM: MRI OF THE LEFT HIP WITHOUT AND  WITH CONTRAST TECHNIQUE: Multiplanar, multisequence MR imaging was performed both before and after administration of intravenous contrast. CONTRAST:  66mL MULTIHANCE GADOBENATE DIMEGLUMINE 529 MG/ML IV SOLN COMPARISON:  None. FINDINGS: Bones: No hip fracture, dislocation or avascular necrosis. Marrow edema in the left femoral head and acetabulum. Mild marrow edema in the anterior right acetabulum. Bilateral pericapsular edema and enhancement. No periosteal reaction or bone destruction. No aggressive osseous lesion. Normal sacrum and sacroiliac joints. No SI joint widening or erosive changes. Articular cartilage and labrum Articular cartilage: Partial-thickness bilateral femoral  heads and acetabulum, left greater than right. Small femoral inferior marginal osteophytes. Labrum: Grossly intact, but evaluation is limited by lack of intraarticular fluid. Joint or bursal effusion Joint effusion: Large left hip joint effusion with severe synovitis and enhancement. Small right hip joint effusion with synovitis and enhancement. No SI joint effusion. Bursae:  No bursa formation. Muscles and tendons Muscle edema in the left obturator internus muscle with a 13 mm fluid collection posteriorly within the muscle concerning for a small abscess. Muscle edema and mild enhancement in the adductor musculature bilaterally and right iliacus muscle. Muscle edema and enhancement of bilateral multifidus muscles. Other findings Miscellaneous: No pelvic free fluid. No fluid collection or hematoma. No inguinal lymphadenopathy. No inguinal hernia. IMPRESSION: 1. Large left hip joint effusion with enhancing synovitis, pericapsular edema and severe marrow edema on either side of the joint most concerning for septic arthritis. 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 2. Small right hip joint effusion with enhancing synovitis and pericapsular edema and enhancement most concerning for septic arthritis. 3. Muscle edema within  the adductor musculature bilaterally and multifidus muscles bilaterally most concerning for infectious myositis. 13 mm fluid collection in the posterior aspect of the left obturator internus muscle most concerning for a small abscess. Electronically Signed   By: Kathreen Devoid   On: 10/02/2017 11:36   Mr Hip Left W Wo Contrast  Result Date: 10/02/2017 CLINICAL DATA:  Bilateral hip and back pain.  Left worse than right. EXAM: MRI OF THE LEFT HIP WITHOUT AND WITH CONTRAST TECHNIQUE: Multiplanar, multisequence MR imaging was performed both before and after administration of intravenous contrast. CONTRAST:  62mL MULTIHANCE GADOBENATE DIMEGLUMINE 529 MG/ML IV SOLN COMPARISON:  None. FINDINGS: Bones: No hip fracture, dislocation or avascular necrosis. Marrow edema in the left femoral head and acetabulum. Mild marrow edema in the anterior right acetabulum. Bilateral pericapsular edema and enhancement. No periosteal reaction or bone destruction. No aggressive osseous lesion. Normal sacrum and sacroiliac joints. No SI joint widening or erosive changes. Articular cartilage and labrum Articular cartilage: Partial-thickness bilateral femoral heads and acetabulum, left greater than right. Small femoral inferior marginal osteophytes. Labrum: Grossly intact, but evaluation is limited by lack of intraarticular fluid. Joint or bursal effusion Joint effusion: Large left hip joint effusion with severe synovitis and enhancement. Small right hip joint effusion with synovitis and enhancement. No SI joint effusion. Bursae:  No bursa formation. Muscles and tendons Muscle edema in the left obturator internus muscle with a 13 mm fluid collection posteriorly within the muscle concerning for a small abscess. Muscle edema and mild enhancement in the adductor musculature bilaterally and right iliacus muscle. Muscle edema and enhancement of bilateral multifidus muscles. Other findings Miscellaneous: No pelvic free fluid. No fluid collection or  hematoma. No inguinal lymphadenopathy. No inguinal hernia. IMPRESSION: 1. Large left hip joint effusion with enhancing synovitis, pericapsular edema and severe marrow edema on either side of the joint most concerning for septic arthritis. 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 2. Small right hip joint effusion with enhancing synovitis and pericapsular edema and enhancement most concerning for septic arthritis. 3. Muscle edema within the adductor musculature bilaterally and multifidus muscles bilaterally most concerning for infectious myositis. 13 mm fluid collection in the posterior aspect of the left obturator internus muscle most concerning for a small abscess. Electronically Signed   By: Kathreen Devoid   On: 10/02/2017 11:36   Mr Knee Left Wo Contrast  Result Date: 09/21/2017 CLINICAL DATA:  32 year old  man with active IV drug abuse, who presents with generalized pain. History is limited secondary to his acute illness including pain and fluctuations in alertness EXAM: MRI OF THE LEFT KNEE WITHOUT CONTRAST TECHNIQUE: Multiplanar, multisequence MR imaging of the knee was performed. No intravenous contrast was administered. COMPARISON:  None. FINDINGS: MENISCI Medial meniscus:  Intact. Lateral meniscus:  Intact. LIGAMENTS Cruciates:  Intact ACL and PCL. Collaterals: Medial collateral ligament is intact. Lateral collateral ligament complex is intact. CARTILAGE Patellofemoral:  No chondral defect. Medial:  No chondral defect. Lateral:  No chondral defect. Joint: No joint effusion. Normal Hoffa's fat. No plical thickening. Popliteal Fossa: No Baker's cyst. Intact popliteus tendon. Mild soft tissue edema superficial to the lateral gastrocnemius muscle. Mild soft tissue edema deep to the distal biceps femoris muscle. Extensor Mechanism: Intact quadriceps tendon. Intact patellar tendon. Intact medial patellar retinaculum. Intact lateral patellar retinaculum. Intact MPFL. Bones: No focal marrow  signal abnormality. No fracture or dislocation. Other: 4.8 x 0.8 x 4 cm complex fluid collection in the subcutaneous fat anterior to the patellar tendon with severe surrounding soft tissue edema most concerning for bursitis which may be secondarily infected. IMPRESSION: 1. 4.8 x 0.8 x 4 cm complex fluid collection in the subcutaneous fat anterior to the patellar tendon with severe surrounding soft tissue edema most concerning for bursitis which may be secondarily infected. 2. No evidence of septic arthritis. Electronically Signed   By: Kathreen Devoid   On: 09/21/2017 09:02   Dg Chest Port 1 View  Result Date: 10/06/2017 CLINICAL DATA:  Fever and left upper chest pain for 1 week. EXAM: PORTABLE CHEST 1 VIEW COMPARISON:  Chest CTA 09/25/2017 FINDINGS: A right PICC terminates over the high right [facility]. The cardiac silhouette is normal in size. Veiling opacity in the left mid and lower hemithorax is consistent with a persistent, small to moderate-sized pleural effusion. Mild parenchymal opacity in the left lung base likely represents atelectasis. No right lung consolidation is seen. Nodular right lung opacities on the prior CT are not clearly apparent on this radiograph. No pneumothorax is identified. No acute osseous abnormality is seen. IMPRESSION: Left pleural effusion and mild left basilar atelectasis. Electronically Signed   By: Logan Bores M.D.   On: 10/06/2017 11:12   [place] Soft Tissue Non Vascular  Result Date: 09/19/2017 CLINICAL DATA:  Leg pain EXAM: ULTRASOUND RIGHT LOWER EXTREMITY LIMITED TECHNIQUE: Ultrasound examination of the lower extremity soft tissues was performed in the area of clinical concern. COMPARISON:  Four days prior FINDINGS: Medial distal thigh great saphenous vein thrombosis that appears complete. The wall appears thickened as well. A smaller and more superficial subcutaneous vein is also thrombosed. There is regional subcutaneous reticulation. No neighboring  abscess. IMPRESSION: Great saphenous vein thrombosis in the lower right thigh. A smaller neighboring subcutaneous vein is also thrombosed. Probable underlying thrombophlebitis. Consider DVT study to evaluate proximal extent. Electronically Signed   By: Monte Fantasia M.D.   On: 09/19/2017 09:47   Dg Hips Bilat With Pelvis 2v  Result Date: 10/01/2017 CLINICAL DATA:  Hip pain. EXAM: DG HIP (WITH OR WITHOUT PELVIS) 2V BILAT COMPARISON:  None. FINDINGS: There are degenerative changes in the left hip with an osteophyte off the femoral neck. No significant loss of joint space. No fractures or dislocations identified. No other acute abnormalities. 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  No other abnormalities. Electronically Signed   By: Dorise Bullion III M.D   On: 10/01/2017 12:46  [place] Ekg Site Rite  Result Date: 09/25/2017 If [facility] image not attached, placement could not be confirmed due to current cardiac rhythm.   Lab Data:  CBC: Recent Labs  Lab 10/12/17 0459 10/14/17 0422  WBC 9.3 12.4*  NEUTROABS 6.6  --   HGB 8.9* 9.3*  HCT 27.8* 28.7*  MCV 94.6 92.6  PLT 297 329   Basic Metabolic Panel: Recent Labs  Lab 10/12/17 0459 10/14/17 0422  NA 135 133*  K 3.9 4.1  CL 98* 95*  CO2 28 30  GLUCOSE 112* 115*  BUN 7 8  CREATININE 0.61 0.59*  CALCIUM 8.7* 8.9   GFR: Estimated Creatinine Clearance: 113.9 mL/min (A) (by C-G formula based on SCr of 0.59 mg/dL (L)). Liver Function Tests: No results for input(s): AST, ALT, ALKPHOS, BILITOT, PROT, ALBUMIN in the last 168 hours. No results for input(s): LIPASE, AMYLASE in the last 168 hours. No results for input(s): AMMONIA in the last 168 hours. Coagulation Profile: No results for input(s): INR, PROTIME in the last 168 hours. Cardiac Enzymes: Recent Labs  Lab 10/13/17 1746  TROPONINI <0.03   BNP (last 3 results) No results for input(s): PROBNP in the last 8760 hours. HbA1C: No results for input(s): HGBA1C in  the last 72 hours. CBG: No results for input(s): GLUCAP in the last 168 hours. Lipid Profile: No results for input(s): CHOL, HDL, LDLCALC, TRIG, CHOLHDL, LDLDIRECT in the last 72 hours. Thyroid Function Tests: No results for input(s): TSH, T4TOTAL, FREET4, T3FREE, THYROIDAB in the last 72 hours. Anemia Panel: No results for input(s): VITAMINB12, FOLATE, FERRITIN, TIBC, IRON, RETICCTPCT in the last 72 hours. Urine analysis:    Component Value Date/Time   COLORURINE YELLOW 10/06/2017 [place] 10/06/2017 1746   LABSPEC 1.017 10/06/2017 1746   PHURINE 7.0 10/06/2017 1746   GLUCOSEU NEGATIVE 10/06/2017 1746   HGBUR MODERATE (A) 10/06/2017 1746   BILIRUBINUR NEGATIVE 10/06/2017 1746   KETONESUR NEGATIVE 10/06/2017 1746   PROTEINUR NEGATIVE 10/06/2017 1746   UROBILINOGEN 0.2 05/16/2012 0847   NITRITE NEGATIVE 10/06/2017 1746   LEUKOCYTESUR NEGATIVE 10/06/2017 1746     Rupa Lagan M.D. [facility] 10/15/2017, 1:15 PM  Pager: [phone] Between 7am to 7pm - call Pager - [phone]  After 7pm go to [email or web address] - password [email or web address]  Call night coverage person covering after 7pm

## 2017-10-16 LAB — CBC
HEMATOCRIT: 26.4 % — AB (ref 39.0–52.0)
Hemoglobin: 8.6 g/dL — ABNORMAL LOW (ref 13.0–17.0)
MCH: 30.5 pg (ref 26.0–34.0)
MCHC: 32.6 g/dL (ref 30.0–36.0)
MCV: 93.6 fL (ref 78.0–100.0)
Platelets: 259 10*3/uL (ref 150–400)
RBC: 2.82 MIL/uL — ABNORMAL LOW (ref 4.22–5.81)
RDW: 13.7 % (ref 11.5–15.5)
WBC: 7.7 10*3/uL (ref 4.0–10.5)

## 2017-10-16 LAB — BASIC METABOLIC PANEL
Anion gap: 8 (ref 5–15)
BUN: 8 mg/dL (ref 6–20)
CHLORIDE: 94 mmol/L — AB (ref 101–111)
CO2: 28 mmol/L (ref 22–32)
Calcium: 8.1 mg/dL — ABNORMAL LOW (ref 8.9–10.3)
Creatinine, Ser: 0.77 mg/dL (ref 0.61–1.24)
GFR calc non Af Amer: >60 mL/min (ref >60)
Glucose, Bld: 122 mg/dL — ABNORMAL HIGH (ref 65–99)
Potassium: 3.6 mmol/L (ref 3.5–5.1)
Sodium: 130 mmol/L — ABNORMAL LOW (ref 135–145)

## 2017-10-16 NOTE — Progress Notes (Addendum)
[facility]                                                                              Patient Demographics  Gary Hicks, is a 32 y.o. male, DOB - 11/14/1985, JIR:[id number]  Admit date - 09/11/2017   Admitting Physician Quintella Baton, MD  Outpatient Primary MD for the patient is Patient, [name] Per  Outpatient specialists:   LOS - 35  days   Medical records reviewed and are as summarized below:    Chief Complaint  Patient presents with  . Weakness  . Leg Pain       Brief summary   32 year old male, active IVDA, who presents with generalized pain. Patient was incarcerated and released 3-4 weeks ago. Patient admits using IV methamphetamine as well as heroin in the past 24 hours prior to admission. Reports global polyarthralgias including his bilateral feet, bilateral knees, bilateral hips and shoulders. Also reports fevers and chills generalized myalgias. Pt admitted for further management. 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 Pt was also diagnosed with DVT Right gastrocnemius vein . He was started on Lovenox 2/18.He was also diagnosed with left knee septic infection. He underwent bursectomy and I and D by Dr Percell Miller 2-25.He reported worsening left hip pain 3-02, MRI showed, B/L hipSeptic arthritis and 13 mm fluid collection in the posterior aspect of the left obturator internus muscle most concerning for a small abscess.IR was consulted for arthrocentesis, patient couldn't tolerate procedure. Dr Percell Miller performed I and D of bilateral hip on 3/5.   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  Blood cultures 2/11 grew MSSA, repeat on 2/15 and 3/7 have been negative -2D echo showed large 1.1 cmx 1.8 cm multilobulated highly mobile vegetation on the right ventricular aspect of  the anterior leaflet of the tricuspid valve.  Moderate to severe tricuspid regurgitation. -Infectious disease was consulted, recommended continue IV Ancef for 6 weeks from 2/23 till 11/05/17 -History of IV drug use, currently on Suboxone, difficult placement hence patient will stay inpatient for the duration of antibiotics -CTA chest negative for PE, no chest pain currently  Septic left knee, bilateral hip joints -MRI of the left knee showed 4.8 x0.8x 4 cm fluid collection in the subcutaneous fat anterior to patella tendon with severe surrounding soft tissue edema concerning for septic arthritis. -MRI hip showed septic arthritis and 13 mm fluid collection in the posterior aspect of the left obturator internus muscle concerning for small abscess -Orthopedics was consulted (Dr Percell Miller).  Status post I&D and bursectomy of the left knee prepatellar bursitis on 09/23/17, status post I&D of septic arthritis bilateral hip on 10/04/17 -Cultures intraoperatively negative -MRI brain negative for abscess, Doppler of the left arm negative for DVT.  Left-sided pleural effusion -Seen on CT scan, moderate, discussed about thoracentesis with the patient, he wants to "think about it", currently asymptomatic - switch to oral anti-coagulation if patient refuses thoracentesis, will give another 24 hours to the patient  History of IV drug  use -Dr. Damita Dunnings managing Suboxone twice daily -No high-dose IV benzos unless required for seizures, no extra narcotics  Malnutrition -Continue nutritional supplements, dietitian was consulted  Generalized anxiety disorder -Psych was consulted, continue Zoloft 50 mg daily -May continue Klonopin as needed for 2 weeks and then wean off, not advised for long-term. -Psych was reconsulted for follow-up recommended increasing the Zoloft and tapering off the Klonopin  Seizures -Likely due to drug use, continue seizure precautions, as needed Ativan  -MRI brain negative for any  abscess  Iron deficiency anemia -H&H currently stable, continue iron supplementation  DVT right gastrocnemius vein with superficial saphenous thrombophlebitis -Recommended therapeutic Lovenox, patient refusing Lovenox at times  Thrombocytopenia -Resolved  Hyponatremia, hypokalemia -Resolved  Hepatitis C antibody positive -ID following  Vitamin D deficiency - Continue supplementation  Constipation -Resolved, continue Senokot S, MiraLAX, feels a lot better today   Code Status: full  DVT Prophylaxis:  Lovenox   Family Communication: Discussed in detail with the patient, all imaging results, lab results explained to the patient    Disposition Plan: Will stay inpatient for the duration of IV antibiotics.     Time Spent in minutes 15 minutes  Procedures:   I&D of L knee on 09/23/17  I &D of bilateral hip on 10/04/17    Consultants:   Infectious disease Orthopedics  Antimicrobials:  IV Ancef till 11/05/17  Medications  Scheduled Meds: . bisacodyl  10 mg Rectal Once  . buprenorphine-naloxone  1 tablet Sublingual BID  . enoxaparin (LOVENOX) injection  90 mg Subcutaneous Q24H  . famotidine  20 mg Oral Daily  . feeding supplement (ENSURE ENLIVE)  237 mL Oral TID BM  . ferrous sulfate  325 mg Oral BID WC  . hydrocortisone cream   Topical BID  . multivitamin with minerals  1 tablet Oral Daily  . nicotine  14 mg Transdermal Daily  . polyethylene glycol  17 g Oral BID  . senna-docusate  1 tablet Oral BID  . sertraline  50 mg Oral Daily  . sodium chloride flush  10-40 mL Intracatheter Q12H  . Vitamin D (Ergocalciferol)  50,000 Units Oral Q Mon   Continuous Infusions: .  ceFAZolin (ANCEF) IV Stopped (10/16/17 5621)  . lactated ringers 10 mL/hr at 09/23/17 1329  . lactated ringers 10 mL/hr at 10/04/17 1237   PRN Meds:.acetaminophen, alum & mag hydroxide-simeth, clonazePAM, hydrOXYzine, ondansetron **OR** ondansetron (ZOFRAN) IV, oxyCODONE, sodium chloride, sodium  chloride flush, sodium chloride flush, traZODone   Antibiotics   Anti-infectives (From admission, onward)   Start     Dose/Rate Route Frequency Ordered Stop   09/23/17 0600  ceFAZolin (ANCEF) IVPB 2g/100 mL premix     2 g 200 mL/hr over 30 Minutes Intravenous To Surgery 09/22/17 1956 09/23/17 1436   09/23/17 0600  ceFAZolin (ANCEF) IVPB 2g/100 mL premix  Status:  Discontinued     2 g 200 mL/hr over 30 Minutes Intravenous On call to O.R. 09/22/17 1956 09/22/17 2002   09/15/17 1500  Oritavancin Diphosphate (ORBACTIV) 1,200 mg in dextrose 5 % IVPB     1,200 mg 333.3 mL/hr over 180 Minutes Intravenous Once 09/15/17 1357 09/15/17 2100   09/12/17 1400  ceFAZolin (ANCEF) IVPB 2g/100 mL premix     2 g 200 mL/hr over 30 Minutes Intravenous Every 8 hours 09/12/17 1300 11/05/17 2359   09/11/17 2100  vancomycin (VANCOCIN) IVPB 750 mg/150 ml premix  Status:  Discontinued     750 mg 150 mL/hr over 60 Minutes Intravenous Every  8 hours 09/11/17 2015 09/12/17 1300        Subjective:   Gary Hicks was seen and examined today.  Had a BM yesterday, feels little better today, no nausea vomiting, abdominal pain.  No chest pain.  No fevers or chills.      Objective:   Vitals:   10/15/17 1721 10/15/17 2057 10/16/17 0543 10/16/17 0900  BP: (!) 91/55 102/63 94/62 110/62  Pulse: 94 (!) 113 (!) 101 (!) 101  Resp: 18 19 20 16   Temp: 98.3 F (36.8 C) 98.8 F (37.1 C) 98.2 F (36.8 C) 98.2 F (36.8 C)  TempSrc: Oral Oral Oral Oral  SpO2: 96% 96% 97% 99%  Weight:  60.3 kg (132 lb 15 oz)    Height:        Intake/Output Summary (Last 24 hours) at 10/16/2017 1040 Last data filed at 10/16/2017 0653 Gross per 24 hour  Intake 4658.5 ml  Output 450 ml  Net 4208.5 ml     Wt Readings from Last 3 Encounters:  10/15/17 60.3 kg (132 lb 15 oz)  09/10/17 63.5 kg (140 lb)  01/05/15 63.5 kg (140 lb)     Exam   General: Alert and oriented x 3, NAD  Eyes:   HEENT:  Atraumatic, normocephalic, normal  oropharynx  Cardiovascular: S1 S2 auscultated, Regular rate and rhythm. No pedal edema b/l  Respiratory: Clear to auscultation bilaterally, no wheezing, rales or rhonchi  Gastrointestinal: Soft, nontender, nondistended, + bowel sounds  Ext: no pedal edema bilaterally  Neuro: no new deficits  Musculoskeletal: No digital cyanosis, clubbing  Skin: Tattoos  Psych: Normal affect and demeanor, alert and oriented x3    Data Reviewed:  I have personally reviewed following labs and imaging studies  Micro Results Recent Results (from the past 240 hour(s))  Urine Culture     Status: Abnormal   Collection Time: 10/06/17  5:46 PM  Result Value Ref Range Status   Specimen Description URINE, CLEAN CATCH  Final   Special Requests NONE  Final   Culture (A)  Final    <10,000 COLONIES/mL Performed at [facility], [address], [place], [place] [address]    Report Status 10/08/2017 FINAL  Final  Culture, blood (routine x 2)     Status: None   Collection Time: 10/06/17  8:46 PM  Result Value Ref Range Status   Specimen Description BLOOD LEFT FOREARM  Final   Special Requests IN PEDIATRIC BOTTLE Blood Culture adequate volume  Final   Culture   Final    NO GROWTH 5 DAYS Performed at [facility], [place] [address] [place], [place] [address]    Report Status 10/11/2017 FINAL  Final  Culture, blood (routine x 2)     Status: None   Collection Time: 10/06/17  8:58 PM  Result Value Ref Range Status   Specimen Description BLOOD LEFT FOREARM  Final   Special Requests IN PEDIATRIC BOTTLE Blood Culture adequate volume  Final   Culture   Final    NO GROWTH 5 DAYS Performed at [facility], [place] [address], [place],  [address]    Report Status 10/11/2017 FINAL  Final    Radiology Reports Dg Chest 2 View  Result Date: 12-16-2017 CLINICAL DATA:  Chest pain. EXAM: CHEST - 2 VIEW COMPARISON:  Radiograph of October 06, 2017. FINDINGS: The heart size and mediastinal contours  are within normal limits. No pneumothorax is noted. Right lung is clear. Right-sided PICC line  is unchanged in position. Stable large left pleural effusion is noted with probable underlying atelectasis or infiltrate. The visualized skeletal structures are unremarkable. IMPRESSION: Stable large left pleural effusion is noted with probable underlying atelectasis or infiltrate. Electronically Signed   By: Marijo Conception, M.D.   On: 02-08-2018 15:53   Ct Angio Chest Pe W Or Wo Contrast  Result Date: 10/15/2017 CLINICAL DATA:  Evaluate for pulmonary embolus. Shortness of breath. EXAM: CT ANGIOGRAPHY CHEST WITH CONTRAST TECHNIQUE: Multidetector CT imaging of the chest was performed using the standard protocol during bolus administration of intravenous contrast. Multiplanar CT image reconstructions and MIPs were obtained to evaluate the vascular anatomy. CONTRAST:  186mL ISOVUE-370 IOPAMIDOL (ISOVUE-370) INJECTION 76% COMPARISON:  Chest CT 09/25/2017. FINDINGS: Cardiovascular: Normal heart size. No pericardial effusion. Normal caliber thoracic aorta. Central venous catheter tip terminates in the superior vena cava. Adequate opacification of the pulmonary arterial system. Motion artifact limits evaluation. No filling defect identified to suggest acute pulmonary embolus. Mediastinum/Nodes: No enlarged axillary, mediastinal or hilar lymphadenopathy. Esophagus is normal in appearance. Lungs/Pleura: Central airways are patent. Endobronchial material within the mid and peripheral right lower lobe bronchi which may represent aspiration. Focal consolidation surrounding the peripheral right lower lobe bronchi. Large layering left pleural effusion. There is consolidation involving the left lower lobe. 6 mm left upper lobe nodule (image 37; series 9), in the location of a prior cyst. 7 mm cystic change right upper lobe peripherally (image 59; series 9) in the area of a prior pulmonary nodule. Previously described right lower  lobe nodule is significantly decreased in size measuring 8 mm (image 107; series 8), previously 13 mm. Upper Abdomen: Reflux of contrast into the hepatic veins. No acute process. Musculoskeletal: No aggressive or acute appearing osseous lesions. Review of the MIP images confirms the above findings. IMPRESSION: 1. No evidence of acute pulmonary embolus. 2. Interval decrease in size of many of the previously described pulmonary nodules, likely secondary to improving infectious/inflammatory process. 3. Endobronchial material within the peripheral right lower lobe bronchi with adjacent consolidation which may represent aspiration. 4. Interval increase in size of large left pleural effusion and left basilar atelectasis. 5. Reflux of contrast into the hepatic veins raising the possibility of right heart insufficiency. Electronically Signed   By: Lovey Newcomer M.D.   On: 10/15/2017 12:24   Ct Angio Chest Pe W Or Wo Contrast  Result Date: 09/25/2017 CLINICAL DATA:  32 year old male with LEFT LOWER extremity thrombosis. IV drug abuser. Generalized pain. Fever chills and myalgias. EXAM: CT ANGIOGRAPHY CHEST WITH CONTRAST TECHNIQUE: Multidetector CT imaging of the chest was performed using the standard protocol during bolus administration of intravenous contrast. Multiplanar CT image reconstructions and MIPs were obtained to evaluate the vascular anatomy. CONTRAST:  132mL ISOVUE-370 IOPAMIDOL (ISOVUE-370) INJECTION 76% COMPARISON:  06/06/2016 and prior chest radiographs FINDINGS: Cardiovascular: This is a technically satisfactory study. No pulmonary emboli are identified. Old healed UPPER limits normal heart size noted. No evidence of thoracic aortic aneurysm or pericardial effusion. Mediastinum/Nodes: No enlarged mediastinal, hilar, or axillary lymph nodes. Thyroid gland, trachea, and esophagus demonstrate no significant findings. Lungs/Pleura: There are multiple irregular bilateral pulmonary nodules with index lesions as  follows: An 8 x 13 mm SUPERIOR segment RIGHT LOWER lobe nodule (6:42) A 4 x 7 mm RIGHT UPPER lobe nodule (6:56) A 12 x 20 mm RIGHT LOWER lobe nodule (6:88) A 28 x 22 mm LEFT LOWER lobe nodule (6:91). These nodules are nonspecific but most likely represent infection or possibly  septic emboli. Malignancy/metastatic disease is considered less likely. A small LEFT pleural effusion and LEFT basilar atelectasis noted. Upper Abdomen: No acute abnormality. Musculoskeletal: No acute abnormality or suspicious bony lesion. Review of the MIP images confirms the above findings. IMPRESSION: 1. Multiple bilateral pulmonary nodules as described. Favor infection or possibly septic emboli. Other inflammatory processes or malignancy/metastatic disease considered less likely. 2. Small LEFT pleural effusion and LEFT basilar atelectasis 3. No evidence of pulmonary emboli or thoracic aortic aneurysm. Electronically Signed   By: Margarette Canada M.D.   On: 09/25/2017 18:42   Mr Jeri Cos WL Contrast  Result Date: 09/18/2017 CLINICAL DATA:  Endocarditis with muscle weakness and right hand numbness. EXAM: MRI HEAD WITHOUT AND WITH CONTRAST TECHNIQUE: Multiplanar, multiecho pulse sequences of the brain and surrounding structures were obtained without and with intravenous contrast. CONTRAST:  50mL MULTIHANCE GADOBENATE DIMEGLUMINE 529 MG/ML IV SOLN COMPARISON:  Head CT 09/12/2017 FINDINGS: Brain: The midline structures are normal. There is no acute infarct or acute hemorrhage. No mass lesion, hydrocephalus, dural abnormality or extra-axial collection. Focus of hyperintense T2-weighted signal within the left frontal periventricular white matter. Otherwise normal parenchymal signal. There is atrophy greater than expected for age with ex vacuo dilatation of the lateral ventricles. 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 No chronic microhemorrhage or superficial siderosis. Vascular: Major intracranial arterial and venous sinus flow voids are  preserved. Skull and upper cervical spine: The visualized skull base, calvarium, upper cervical spine and extracranial soft tissues are normal. Sinuses/Orbits: No fluid levels or advanced mucosal thickening. No mastoid or middle ear effusion. Normal orbits. IMPRESSION: Age advanced volume loss with ex vacuo dilatation of the ventricles, but no acute abnormality. Electronically Signed   By: Ulyses Jarred M.D.   On: 09/18/2017 02:30   Mr Cervical Spine Wo Contrast  Result Date: 09/20/2017 CLINICAL DATA:  Generalized pain, hand numbness and weakness. History of endocarditis and intravenous drug abuse. EXAM: MRI CERVICAL SPINE WITHOUT CONTRAST TECHNIQUE: Multiplanar, multisequence MR imaging of the cervical spine was performed. No intravenous contrast was administered. COMPARISON:  MRI of the head September 18, 2017 FINDINGS: ALIGNMENT: Straightened cervical lordosis.  No malalignment. VERTEBRAE/DISCS: Vertebral bodies are intact. Intervertebral disc morphology's and signal are normal. Mild C6-7 ventral endplate spurring. No abnormal or acute bone marrow signal. CORD:Cervical spinal cord is normal morphology and signal characteristics from the cervicomedullary junction to level of T1-2, the most caudal well visualized level. POSTERIOR FOSSA, VERTEBRAL ARTERIES, PARASPINAL TISSUES: No MR findings of ligamentous injury. Vertebral artery flow voids present. Included posterior fossa and paraspinal soft tissues are normal. 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 DISC LEVELS (axial sequences mild to moderately motion degraded): C2-3 through C7-T1: No disc bulge, canal stenosis nor neural foraminal narrowing. Mild C3-4 through C5-6 facet arthropathy. IMPRESSION: 1. Motion degraded examination. 2. No fracture, malalignment or acute osseous process. No MR findings of infection by noncontrast MRI. 3. Early degenerative change of the cervical spine without canal stenosis or neural foraminal narrowing.  Electronically Signed   By: Elon Alas M.D.   On: 09/20/2017 23:16   Mr Hip Right W Wo Contrast  Result Date: 10/02/2017 CLINICAL DATA:  Bilateral hip and back pain.  Left worse than right. EXAM: MRI OF THE LEFT HIP WITHOUT AND WITH CONTRAST TECHNIQUE: Multiplanar, multisequence MR imaging was performed both before and after administration of intravenous contrast. CONTRAST:  72mL MULTIHANCE GADOBENATE DIMEGLUMINE 529 MG/ML IV SOLN COMPARISON:  None. FINDINGS: Bones: No hip fracture, dislocation or avascular  necrosis. Marrow edema in the left femoral head and acetabulum. Mild marrow edema in the anterior right acetabulum. Bilateral pericapsular edema and enhancement. No periosteal reaction or bone destruction. No aggressive osseous lesion. Normal sacrum and sacroiliac joints. No SI joint widening or erosive changes. Articular cartilage and labrum Articular cartilage: Partial-thickness bilateral femoral heads and acetabulum, left greater than right. Small femoral inferior marginal osteophytes. Labrum: Grossly intact, but evaluation is limited by lack of intraarticular fluid. Joint or bursal effusion Joint effusion: Large left hip joint effusion with severe synovitis and enhancement. Small right hip joint effusion with synovitis and enhancement. No SI joint effusion. Bursae:  No bursa formation. Muscles and tendons Muscle edema in the left obturator internus muscle with a 13 mm fluid collection posteriorly within the muscle concerning for a small abscess. Muscle edema and mild enhancement in the adductor musculature bilaterally and right iliacus muscle. Muscle edema and enhancement of bilateral multifidus muscles. Other findings Miscellaneous: No pelvic free fluid. No fluid collection or hematoma. No inguinal lymphadenopathy. No inguinal hernia. IMPRESSION: 1. Large left hip joint effusion with enhancing synovitis, pericapsular edema and severe marrow edema on either side of the joint most concerning for  septic arthritis. 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 2. Small right hip joint effusion with enhancing synovitis and pericapsular edema and enhancement most concerning for septic arthritis. 3. Muscle edema within the adductor musculature bilaterally and multifidus muscles bilaterally most concerning for infectious myositis. 13 mm fluid collection in the posterior aspect of the left obturator internus muscle most concerning for a small abscess. Electronically Signed   By: Kathreen Devoid   On: 10/02/2017 11:36   Mr Hip Left W Wo Contrast  Result Date: 10/02/2017 CLINICAL DATA:  Bilateral hip and back pain.  Left worse than right. EXAM: MRI OF THE LEFT HIP WITHOUT AND WITH CONTRAST TECHNIQUE: Multiplanar, multisequence MR imaging was performed both before and after administration of intravenous contrast. CONTRAST:  11mL MULTIHANCE GADOBENATE DIMEGLUMINE 529 MG/ML IV SOLN COMPARISON:  None. FINDINGS: Bones: No hip fracture, dislocation or avascular necrosis. Marrow edema in the left femoral head and acetabulum. Mild marrow edema in the anterior right acetabulum. Bilateral pericapsular edema and enhancement. No periosteal reaction or bone destruction. No aggressive osseous lesion. Normal sacrum and sacroiliac joints. No SI joint widening or erosive changes. Articular cartilage and labrum Articular cartilage: Partial-thickness bilateral femoral heads and acetabulum, left greater than right. Small femoral inferior marginal osteophytes. Labrum: Grossly intact, but evaluation is limited by lack of intraarticular fluid. Joint or bursal effusion Joint effusion: Large left hip joint effusion with severe synovitis and enhancement. Small right hip joint effusion with synovitis and enhancement. No SI joint effusion. Bursae:  No bursa formation. Muscles and tendons Muscle edema in the left obturator internus muscle with a 13 mm fluid collection posteriorly within the muscle concerning for a small  abscess. Muscle edema and mild enhancement in the adductor musculature bilaterally and right iliacus muscle. Muscle edema and enhancement of bilateral multifidus muscles. Other findings Miscellaneous: No pelvic free fluid. No fluid collection or hematoma. No inguinal lymphadenopathy. No inguinal hernia. IMPRESSION: 1. Large left hip joint effusion with enhancing synovitis, pericapsular edema and severe marrow edema on either side of the joint most concerning for septic arthritis. 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 2. Small right hip joint effusion with enhancing synovitis and pericapsular edema and enhancement most concerning for septic arthritis. 3. Muscle edema within the adductor musculature bilaterally and multifidus muscles  bilaterally most concerning for infectious myositis. 13 mm fluid collection in the posterior aspect of the left obturator internus muscle most concerning for a small abscess. Electronically Signed   By: Kathreen Devoid   On: 10/02/2017 11:36   Mr Knee Left Wo Contrast  Result Date: 09/21/2017 CLINICAL DATA:  32 year old man with active IV drug abuse, who presents with generalized pain. History is limited secondary to his acute illness including pain and fluctuations in alertness EXAM: MRI OF THE LEFT KNEE WITHOUT CONTRAST TECHNIQUE: Multiplanar, multisequence MR imaging of the knee was performed. No intravenous contrast was administered. COMPARISON:  None. FINDINGS: MENISCI Medial meniscus:  Intact. Lateral meniscus:  Intact. LIGAMENTS Cruciates:  Intact ACL and PCL. Collaterals: Medial collateral ligament is intact. Lateral collateral ligament complex is intact. CARTILAGE Patellofemoral:  No chondral defect. Medial:  No chondral defect. Lateral:  No chondral defect. Joint: No joint effusion. Normal Hoffa's fat. No plical thickening. Popliteal Fossa: No Baker's cyst. Intact popliteus tendon. Mild soft tissue edema superficial to the lateral gastrocnemius  muscle. Mild soft tissue edema deep to the distal biceps femoris muscle. Extensor Mechanism: Intact quadriceps tendon. Intact patellar tendon. Intact medial patellar retinaculum. Intact lateral patellar retinaculum. Intact MPFL. Bones: No focal marrow signal abnormality. No fracture or dislocation. Other: 4.8 x 0.8 x 4 cm complex fluid collection in the subcutaneous fat anterior to the patellar tendon with severe surrounding soft tissue edema most concerning for bursitis which may be secondarily infected. IMPRESSION: 1. 4.8 x 0.8 x 4 cm complex fluid collection in the subcutaneous fat anterior to the patellar tendon with severe surrounding soft tissue edema most concerning for bursitis which may be secondarily infected. 2. No evidence of septic arthritis. Electronically Signed   By: Kathreen Devoid   On: 09/21/2017 09:02   Dg Chest Port 1 View  Result Date: 10/06/2017 CLINICAL DATA:  Fever and left upper chest pain for 1 week. EXAM: PORTABLE CHEST 1 VIEW COMPARISON:  Chest CTA 09/25/2017 FINDINGS: A right PICC terminates over the high right atrium. The cardiac silhouette is normal in size. Veiling opacity in the left mid and lower hemithorax is consistent with a persistent, small to moderate-sized pleural effusion. Mild parenchymal opacity in the left lung base likely represents atelectasis. No right lung consolidation is seen. Nodular right lung opacities on the prior CT are not clearly apparent on this radiograph. No pneumothorax is identified. No acute osseous abnormality is seen. IMPRESSION: Left pleural effusion and mild left basilar atelectasis. Electronically Signed   By: Logan Bores M.D.   On: 10/06/2017 11:12   [place] Soft Tissue Non Vascular  Result Date: 09/19/2017 CLINICAL DATA:  Leg pain EXAM: ULTRASOUND RIGHT LOWER EXTREMITY LIMITED TECHNIQUE: Ultrasound examination of the lower extremity soft tissues was performed in the area of clinical concern. COMPARISON:  Four days prior  FINDINGS: Medial distal thigh great saphenous vein thrombosis that appears complete. The wall appears thickened as well. A smaller and more superficial subcutaneous vein is also thrombosed. There is regional subcutaneous reticulation. No neighboring abscess. IMPRESSION: Great saphenous vein thrombosis in the lower right thigh. A smaller neighboring subcutaneous vein is also thrombosed. Probable underlying thrombophlebitis. Consider DVT study to evaluate proximal extent. Electronically Signed   By: Monte Fantasia M.D.   On: 09/19/2017 09:47   Dg Hips Bilat With Pelvis 2v  Result Date: 10/01/2017 CLINICAL DATA:  Hip pain. EXAM: DG HIP (WITH OR WITHOUT PELVIS) 2V BILAT COMPARISON:  None. FINDINGS: There are degenerative changes  in the left hip with an osteophyte off the femoral neck. No significant loss of joint space. No fractures or dislocations identified. No other acute abnormalities. 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  No other abnormalities. Electronically Signed   By: Dorise Bullion III M.D   On: 10/01/2017 12:46   [place] Ekg Site Rite  Result Date: 09/25/2017 If Site Rite image not attached, placement could not be confirmed due to current cardiac rhythm.   Lab Data:  CBC: Recent Labs  Lab 10/12/17 0459 10/14/17 0422  WBC 9.3 12.4*  NEUTROABS 6.6  --   HGB 8.9* 9.3*  HCT 27.8* 28.7*  MCV 94.6 92.6  PLT 297 539   Basic Metabolic Panel: Recent Labs  Lab 10/12/17 0459 10/14/17 0422  NA 135 133*  K 3.9 4.1  CL 98* 95*  CO2 28 30  GLUCOSE 112* 115*  BUN 7 8  CREATININE 0.61 0.59*  CALCIUM 8.7* 8.9   GFR: Estimated Creatinine Clearance: 114.1 mL/min (A) (by C-G formula based on SCr of 0.59 mg/dL (L)). Liver Function Tests: No results for input(s): AST, ALT, ALKPHOS, BILITOT, PROT, ALBUMIN in the last 168 hours. No results for input(s): LIPASE, AMYLASE in the last 168 hours. No results for input(s): AMMONIA in the last 168 hours. Coagulation Profile: No  results for input(s): INR, PROTIME in the last 168 hours. Cardiac Enzymes: Recent Labs  Lab 10/13/17 1746  TROPONINI <0.03   BNP (last 3 results) No results for input(s): PROBNP in the last 8760 hours. HbA1C: No results for input(s): HGBA1C in the last 72 hours. CBG: No results for input(s): GLUCAP in the last 168 hours. Lipid Profile: No results for input(s): CHOL, HDL, LDLCALC, TRIG, CHOLHDL, LDLDIRECT in the last 72 hours. Thyroid Function Tests: No results for input(s): TSH, T4TOTAL, FREET4, T3FREE, THYROIDAB in the last 72 hours. Anemia Panel: No results for input(s): VITAMINB12, FOLATE, FERRITIN, TIBC, IRON, RETICCTPCT in the last 72 hours. Urine analysis:    Component Value Date/Time   COLORURINE YELLOW 10/06/2017 [place] 10/06/2017 1746   LABSPEC 1.017 10/06/2017 1746   PHURINE 7.0 10/06/2017 1746   GLUCOSEU NEGATIVE 10/06/2017 1746   HGBUR MODERATE (A) 10/06/2017 1746   BILIRUBINUR NEGATIVE 10/06/2017 1746   KETONESUR NEGATIVE 10/06/2017 1746   PROTEINUR NEGATIVE 10/06/2017 1746   UROBILINOGEN 0.2 05/16/2012 0847   NITRITE NEGATIVE 10/06/2017 1746   LEUKOCYTESUR NEGATIVE 10/06/2017 1746     Gary Hicks M.D. [facility] 10/16/2017, 10:40 AM  Pager: [phone] Between 7am to 7pm - call Pager - [phone]  After 7pm go to [email or web address] - password [email or web address]  Call night coverage person covering after 7pm

## 2017-10-17 LAB — BASIC METABOLIC PANEL
Anion gap: 10 (ref 5–15)
BUN: 11 mg/dL (ref 6–20)
CALCIUM: 8.4 mg/dL — AB (ref 8.9–10.3)
CO2: 29 mmol/L (ref 22–32)
Chloride: 95 mmol/L — ABNORMAL LOW (ref 101–111)
Creatinine, Ser: 0.63 mg/dL (ref 0.61–1.24)
GFR calc Af Amer: >60 mL/min (ref >60)
GFR calc non Af Amer: >60 mL/min (ref >60)
GLUCOSE: 108 mg/dL — AB (ref 65–99)
Potassium: 4.3 mmol/L (ref 3.5–5.1)
Sodium: 134 mmol/L — ABNORMAL LOW (ref 135–145)

## 2017-10-17 LAB — CBC
HCT: 27.4 % — ABNORMAL LOW (ref 39.0–52.0)
HEMOGLOBIN: 9 g/dL — AB (ref 13.0–17.0)
MCH: 30.6 pg (ref 26.0–34.0)
MCHC: 32.8 g/dL (ref 30.0–36.0)
MCV: 93.2 fL (ref 78.0–100.0)
Platelets: 269 10*3/uL (ref 150–400)
RBC: 2.94 MIL/uL — ABNORMAL LOW (ref 4.22–5.81)
RDW: 13.5 % (ref 11.5–15.5)
WBC: 9.2 10*3/uL (ref 4.0–10.5)

## 2017-10-17 NOTE — Progress Notes (Signed)
[facility]                                                                              Patient Demographics  Gary Hicks, is a 32 y.o. male, DOB - [birth year], HKV:[id number]  Admit date - 09/11/2017   Admitting Physician Quintella Baton, MD  Outpatient Primary MD for the patient is Patient, [name] Per  Outpatient specialists:   LOS - 36  days   Medical records reviewed and are as summarized below:    Chief Complaint  Patient presents with  . Weakness  . Leg Pain       Brief summary   32 year old male, active IVDA, who presents with generalized pain. Patient was incarcerated and released 3-4 weeks ago. Patient admits using IV methamphetamine as well as heroin in the past 24 hours prior to admission. Reports global polyarthralgias including his bilateral feet, bilateral knees, bilateral hips and shoulders. Also reports fevers and chills generalized myalgias. Pt admitted for further management. 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 Pt was also diagnosed with DVT Right gastrocnemius vein . He was started on Lovenox 2/18.He was also diagnosed with left knee septic infection. He underwent bursectomy and I and D by Dr Percell Miller 2-25.He reported worsening left hip pain 3-02, MRI showed, B/L hipSeptic arthritis and 13 mm fluid collection in the posterior aspect of the left obturator internus muscle most concerning for a small abscess.IR was consulted for arthrocentesis, patient couldn't tolerate procedure. Dr Percell Miller performed I and D of bilateral hip on 3/5.   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  Blood cultures 2/11 grew MSSA, repeat on 2/15 and 3/7 have been negative -2D echo showed large 1.1 cmx 1.8 cm multilobulated highly mobile vegetation on the right ventricular aspect of  the anterior leaflet of the tricuspid valve.  Moderate to severe tricuspid regurgitation. -Infectious disease was consulted, recommended continue IV Ancef for 6 weeks from 2/23 till 11/05/17 -History of IV drug use, currently on Suboxone, difficult placement hence patient will stay inpatient for the duration of antibiotics -CTA chest negative for PE, no chest pain currently  Septic left knee, bilateral hip joints -MRI of the left knee showed 4.8 x0.8x 4 cm fluid collection in the subcutaneous fat anterior to patella tendon with severe surrounding soft tissue edema concerning for septic arthritis. -MRI hip showed septic arthritis and 13 mm fluid collection in the posterior aspect of the left obturator internus muscle concerning for small abscess -Orthopedics was consulted (Dr Percell Miller).  Status post I&D and bursectomy of the left knee prepatellar bursitis on 09/23/17, status post I&D of septic arthritis bilateral hip on 10/04/17 -Cultures intraoperatively negative -MRI brain negative for abscess, Doppler of the left arm negative for DVT. -No complaints today  Left-sided pleural effusion -Seen on CT scan, moderate, he has not made decision about thoracentesis yet.   - switch to oral anti-coagulation if patient refuses thoracentesis  History of IV drug use -Dr. Damita Dunnings managing Suboxone twice daily -No high-dose  IV benzos unless required for seizures, no extra narcotics  Malnutrition -Continue nutritional supplements, dietitian was consulted  Generalized anxiety disorder -Psych was consulted, continue Zoloft 50 mg daily -May continue Klonopin as needed for 2 weeks and then wean off, not advised for long-term. -Psych was reconsulted for follow-up recommended increasing the Zoloft and tapering off the Klonopin  Seizures -Likely due to drug use, continue seizure precautions, as needed Ativan  -MRI brain negative for any abscess  Iron deficiency anemia -H&H currently stable, continue iron  supplementation  DVT right gastrocnemius vein with superficial saphenous thrombophlebitis -Recommended therapeutic Lovenox, patient refusing Lovenox at times  Thrombocytopenia -Resolved  Hyponatremia, hypokalemia -Resolved  Hepatitis C antibody positive -ID following  Vitamin D deficiency - Continue supplementation  Constipation -Resolved, continue Senokot S, MiraLAX, feels a lot better today   Code Status: full  DVT Prophylaxis:  Lovenox   Family Communication: Discussed in detail with the patient, all imaging results, lab results explained to the patient    Disposition Plan: Will stay inpatient for the duration of IV antibiotics.     Time Spent in minutes 15 minutes  Procedures:   I&D of L knee on 09/23/17  I &D of bilateral hip on 10/04/17    Consultants:   Infectious disease Orthopedics  Antimicrobials:  IV Ancef till 11/05/17  Medications  Scheduled Meds: . bisacodyl  10 mg Rectal Once  . buprenorphine-naloxone  1 tablet Sublingual BID  . enoxaparin (LOVENOX) injection  90 mg Subcutaneous Q24H  . famotidine  20 mg Oral Daily  . feeding supplement (ENSURE ENLIVE)  237 mL Oral TID BM  . ferrous sulfate  325 mg Oral BID WC  . hydrocortisone cream   Topical BID  . multivitamin with minerals  1 tablet Oral Daily  . nicotine  14 mg Transdermal Daily  . polyethylene glycol  17 g Oral BID  . senna-docusate  1 tablet Oral BID  . sertraline  50 mg Oral Daily  . sodium chloride flush  10-40 mL Intracatheter Q12H  . Vitamin D (Ergocalciferol)  50,000 Units Oral Q Mon   Continuous Infusions: .  ceFAZolin (ANCEF) IV Stopped (10/17/17 5188)  . lactated ringers 10 mL/hr at 09/23/17 1329  . lactated ringers 10 mL/hr at 10/04/17 1237   PRN Meds:.acetaminophen, alum & mag hydroxide-simeth, clonazePAM, hydrOXYzine, ondansetron **OR** ondansetron (ZOFRAN) IV, oxyCODONE, sodium chloride, sodium chloride flush, sodium chloride flush, traZODone   Antibiotics    Anti-infectives (From admission, onward)   Start     Dose/Rate Route Frequency Ordered Stop   09/23/17 0600  ceFAZolin (ANCEF) IVPB 2g/100 mL premix     2 g 200 mL/hr over 30 Minutes Intravenous To Surgery 09/22/17 1956 09/23/17 1436   09/23/17 0600  ceFAZolin (ANCEF) IVPB 2g/100 mL premix  Status:  Discontinued     2 g 200 mL/hr over 30 Minutes Intravenous On call to O.R. 09/22/17 1956 09/22/17 2002   09/15/17 1500  Oritavancin Diphosphate (ORBACTIV) 1,200 mg in dextrose 5 % IVPB     1,200 mg 333.3 mL/hr over 180 Minutes Intravenous Once 09/15/17 1357 09/15/17 2100   09/12/17 1400  ceFAZolin (ANCEF) IVPB 2g/100 mL premix     2 g 200 mL/hr over 30 Minutes Intravenous Every 8 hours 09/12/17 1300 11/05/17 2359   09/11/17 2100  vancomycin (VANCOCIN) IVPB 750 mg/150 ml premix  Status:  Discontinued     750 mg 150 mL/hr over 60 Minutes Intravenous Every 8 hours 09/11/17 2015 09/12/17 1300  Subjective:   Gary Hicks was seen and examined today.  No complaints, feeling better, no chest pain, nausea, vomiting, abdominal pain.  No fevers or chills.  Yes  Objective:   Vitals:   10/16/17 1624 10/16/17 2100 10/17/17 0600 10/17/17 1015  BP: 92/60 94/60 98/61  102/65  Pulse: (!) 105 (!) 104 (!) 116 (!) 113  Resp: 16 19 20 20   Temp: 99 F (37.2 C) 98.5 F (36.9 C) 98.7 F (37.1 C) 99.2 F (37.3 C)  TempSrc: Oral Oral Oral Oral  SpO2: 99% 100% 95% 96%  Weight:  60.2 kg (132 lb 11.5 oz)    Height:        Intake/Output Summary (Last 24 hours) at 10/17/2017 1107 Last data filed at 10/17/2017 7782 Gross per 24 hour  Intake 717 ml  Output 0 ml  Net 717 ml     Wt Readings from Last 3 Encounters:  10/16/17 60.2 kg (132 lb 11.5 oz)  09/10/17 63.5 kg (140 lb)  01/05/15 63.5 kg (140 lb)     Exam  General: Alert and oriented x 3, NAD Eyes:  HEENT:   Cardiovascular: S1 S2 auscultated, RRR No pedal edema b/l Respiratory: Clear to auscultation bilaterally, no wheezing, rales  or rhonchi Gastrointestinal: Soft, nontender, nondistended, + bowel sounds Ext: no pedal edema bilaterally Neuro: no new deficits Musculoskeletal: No digital cyanosis, clubbing Skin: tatoos Psych: Normal affect and demeanor, alert and oriented x3      Data Reviewed:  I have personally reviewed following labs and imaging studies  Micro Results No results found for this or any previous visit (from the past 240 hour(s)).  Radiology Reports Dg Chest 2 View  Result Date: 2020/11/1017 CLINICAL DATA:  Chest pain. EXAM: CHEST - 2 VIEW COMPARISON:  Radiograph of October 06, 2017. FINDINGS: The heart size and mediastinal contours are within normal limits. No pneumothorax is noted. Right lung is clear. Right-sided PICC line is unchanged in position. Stable large left pleural effusion is noted with probable underlying atelectasis or infiltrate. The visualized skeletal structures are unremarkable. IMPRESSION: Stable large left pleural effusion is noted with probable underlying atelectasis or infiltrate. Electronically Signed   By: Marijo Conception, M.D.   On: 02020/11/1017 15:53   Ct Angio Chest Pe W Or Wo Contrast  Result Date: 10/15/2017 CLINICAL DATA:  Evaluate for pulmonary embolus. Shortness of breath. EXAM: CT ANGIOGRAPHY CHEST WITH CONTRAST TECHNIQUE: Multidetector CT imaging of the chest was performed using the standard protocol during bolus administration of intravenous contrast. Multiplanar CT image reconstructions and MIPs were obtained to evaluate the vascular anatomy. CONTRAST:  139mL ISOVUE-370 IOPAMIDOL (ISOVUE-370) INJECTION 76% COMPARISON:  Chest CT 09/25/2017. FINDINGS: Cardiovascular: Normal heart size. No pericardial effusion. Normal caliber thoracic aorta. Central venous catheter tip terminates in the superior vena cava. Adequate opacification of the pulmonary arterial system. Motion artifact limits evaluation. No filling defect identified to suggest acute pulmonary embolus. Mediastinum/Nodes:  No enlarged axillary, mediastinal or hilar lymphadenopathy. Esophagus is normal in appearance. Lungs/Pleura: Central airways are patent. Endobronchial material within the mid and peripheral right lower lobe bronchi which may represent aspiration. Focal consolidation surrounding the peripheral right lower lobe bronchi. Large layering left pleural effusion. There is consolidation involving the left lower lobe. 6 mm left upper lobe nodule (image 37; series 9), in the location of a prior cyst. 7 mm cystic change right upper lobe peripherally (image 59; series 9) in the area of a prior pulmonary nodule. Previously described right lower lobe nodule is significantly  decreased in size measuring 8 mm (image 107; series 8), previously 13 mm. Upper Abdomen: Reflux of contrast into the hepatic veins. No acute process. Musculoskeletal: No aggressive or acute appearing osseous lesions. Review of the MIP images confirms the above findings. IMPRESSION: 1. No evidence of acute pulmonary embolus. 2. Interval decrease in size of many of the previously described pulmonary nodules, likely secondary to improving infectious/inflammatory process. 3. Endobronchial material within the peripheral right lower lobe bronchi with adjacent consolidation which may represent aspiration. 4. Interval increase in size of large left pleural effusion and left basilar atelectasis. 5. Reflux of contrast into the hepatic veins raising the possibility of right heart insufficiency. Electronically Signed   By: Lovey Newcomer M.D.   On: 10/15/2017 12:24   Ct Angio Chest Pe W Or Wo Contrast  Result Date: 09/25/2017 CLINICAL DATA:  32 year old male with LEFT LOWER extremity thrombosis. IV drug abuser. Generalized pain. Fever chills and myalgias. EXAM: CT ANGIOGRAPHY CHEST WITH CONTRAST TECHNIQUE: Multidetector CT imaging of the chest was performed using the standard protocol during bolus administration of intravenous contrast. Multiplanar CT image  reconstructions and MIPs were obtained to evaluate the vascular anatomy. CONTRAST:  127mL ISOVUE-370 IOPAMIDOL (ISOVUE-370) INJECTION 76% COMPARISON:  06/06/2016 and prior chest radiographs FINDINGS: Cardiovascular: This is a technically satisfactory study. No pulmonary emboli are identified. Old healed UPPER limits normal heart size noted. No evidence of thoracic aortic aneurysm or pericardial effusion. Mediastinum/Nodes: No enlarged mediastinal, hilar, or axillary lymph nodes. Thyroid gland, trachea, and esophagus demonstrate no significant findings. Lungs/Pleura: There are multiple irregular bilateral pulmonary nodules with index lesions as follows: An 8 x 13 mm SUPERIOR segment RIGHT LOWER lobe nodule (6:42) A 4 x 7 mm RIGHT UPPER lobe nodule (6:56) A 12 x 20 mm RIGHT LOWER lobe nodule (6:88) A 28 x 22 mm LEFT LOWER lobe nodule (6:91). These nodules are nonspecific but most likely represent infection or possibly septic emboli. Malignancy/metastatic disease is considered less likely. A small LEFT pleural effusion and LEFT basilar atelectasis noted. Upper Abdomen: No acute abnormality. Musculoskeletal: No acute abnormality or suspicious bony lesion. Review of the MIP images confirms the above findings. IMPRESSION: 1. Multiple bilateral pulmonary nodules as described. Favor infection or possibly septic emboli. Other inflammatory processes or malignancy/metastatic disease considered less likely. 2. Small LEFT pleural effusion and LEFT basilar atelectasis 3. No evidence of pulmonary emboli or thoracic aortic aneurysm. Electronically Signed   By: Margarette Canada M.D.   On: 09/25/2017 18:42   Mr Jeri Cos PI Contrast  Result Date: 09/18/2017 CLINICAL DATA:  Endocarditis with muscle weakness and right hand numbness. EXAM: MRI HEAD WITHOUT AND WITH CONTRAST TECHNIQUE: Multiplanar, multiecho pulse sequences of the brain and surrounding structures were obtained without and with intravenous contrast. CONTRAST:  74mL  MULTIHANCE GADOBENATE DIMEGLUMINE 529 MG/ML IV SOLN COMPARISON:  Head CT 09/12/2017 FINDINGS: Brain: The midline structures are normal. There is no acute infarct or acute hemorrhage. No mass lesion, hydrocephalus, dural abnormality or extra-axial collection. Focus of hyperintense T2-weighted signal within the left frontal periventricular white matter. Otherwise normal parenchymal signal. There is atrophy greater than expected for age with ex vacuo dilatation of the lateral ventricles. 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 No chronic microhemorrhage or superficial siderosis. Vascular: Major intracranial arterial and venous sinus flow voids are preserved. Skull and upper cervical spine: The visualized skull base, calvarium, upper cervical spine and extracranial soft tissues are normal. Sinuses/Orbits: No fluid levels or advanced mucosal thickening. No mastoid or middle  ear effusion. Normal orbits. IMPRESSION: Age advanced volume loss with ex vacuo dilatation of the ventricles, but no acute abnormality. Electronically Signed   By: Ulyses Jarred M.D.   On: 09/18/2017 02:30   Mr Cervical Spine Wo Contrast  Result Date: 09/20/2017 CLINICAL DATA:  Generalized pain, hand numbness and weakness. History of endocarditis and intravenous drug abuse. EXAM: MRI CERVICAL SPINE WITHOUT CONTRAST TECHNIQUE: Multiplanar, multisequence MR imaging of the cervical spine was performed. No intravenous contrast was administered. COMPARISON:  MRI of the head September 18, 2017 FINDINGS: ALIGNMENT: Straightened cervical lordosis.  No malalignment. VERTEBRAE/DISCS: Vertebral bodies are intact. Intervertebral disc morphology's and signal are normal. Mild C6-7 ventral endplate spurring. No abnormal or acute bone marrow signal. CORD:Cervical spinal cord is normal morphology and signal characteristics from the cervicomedullary junction to level of T1-2, the most caudal well visualized level. POSTERIOR FOSSA, VERTEBRAL ARTERIES,  PARASPINAL TISSUES: No MR findings of ligamentous injury. Vertebral artery flow voids present. Included posterior fossa and paraspinal soft tissues are normal. 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 DISC LEVELS (axial sequences mild to moderately motion degraded): C2-3 through C7-T1: No disc bulge, canal stenosis nor neural foraminal narrowing. Mild C3-4 through C5-6 facet arthropathy. IMPRESSION: 1. Motion degraded examination. 2. No fracture, malalignment or acute osseous process. No MR findings of infection by noncontrast MRI. 3. Early degenerative change of the cervical spine without canal stenosis or neural foraminal narrowing. Electronically Signed   By: Elon Alas M.D.   On: 09/20/2017 23:16   Mr Hip Right W Wo Contrast  Result Date: 10/02/2017 CLINICAL DATA:  Bilateral hip and back pain.  Left worse than right. EXAM: MRI OF THE LEFT HIP WITHOUT AND WITH CONTRAST TECHNIQUE: Multiplanar, multisequence MR imaging was performed both before and after administration of intravenous contrast. CONTRAST:  46mL MULTIHANCE GADOBENATE DIMEGLUMINE 529 MG/ML IV SOLN COMPARISON:  None. FINDINGS: Bones: No hip fracture, dislocation or avascular necrosis. Marrow edema in the left femoral head and acetabulum. Mild marrow edema in the anterior right acetabulum. Bilateral pericapsular edema and enhancement. No periosteal reaction or bone destruction. No aggressive osseous lesion. Normal sacrum and sacroiliac joints. No SI joint widening or erosive changes. Articular cartilage and labrum Articular cartilage: Partial-thickness bilateral femoral heads and acetabulum, left greater than right. Small femoral inferior marginal osteophytes. Labrum: Grossly intact, but evaluation is limited by lack of intraarticular fluid. Joint or bursal effusion Joint effusion: Large left hip joint effusion with severe synovitis and enhancement. Small right hip joint effusion with synovitis and enhancement. No SI joint  effusion. Bursae:  No bursa formation. Muscles and tendons Muscle edema in the left obturator internus muscle with a 13 mm fluid collection posteriorly within the muscle concerning for a small abscess. Muscle edema and mild enhancement in the adductor musculature bilaterally and right iliacus muscle. Muscle edema and enhancement of bilateral multifidus muscles. Other findings Miscellaneous: No pelvic free fluid. No fluid collection or hematoma. No inguinal lymphadenopathy. No inguinal hernia. IMPRESSION: 1. Large left hip joint effusion with enhancing synovitis, pericapsular edema and severe marrow edema on either side of the joint most concerning for septic arthritis. 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 2. Small right hip joint effusion with enhancing synovitis and pericapsular edema and enhancement most concerning for septic arthritis. 3. Muscle edema within the adductor musculature bilaterally and multifidus muscles bilaterally most concerning for infectious myositis. 13 mm fluid collection in the posterior aspect of the left obturator internus muscle most concerning for a small  abscess. Electronically Signed   By: Kathreen Devoid   On: 10/02/2017 11:36   Mr Hip Left W Wo Contrast  Result Date: 10/02/2017 CLINICAL DATA:  Bilateral hip and back pain.  Left worse than right. EXAM: MRI OF THE LEFT HIP WITHOUT AND WITH CONTRAST TECHNIQUE: Multiplanar, multisequence MR imaging was performed both before and after administration of intravenous contrast. CONTRAST:  44mL MULTIHANCE GADOBENATE DIMEGLUMINE 529 MG/ML IV SOLN COMPARISON:  None. FINDINGS: Bones: No hip fracture, dislocation or avascular necrosis. Marrow edema in the left femoral head and acetabulum. Mild marrow edema in the anterior right acetabulum. Bilateral pericapsular edema and enhancement. No periosteal reaction or bone destruction. No aggressive osseous lesion. Normal sacrum and sacroiliac joints. No SI joint widening or  erosive changes. Articular cartilage and labrum Articular cartilage: Partial-thickness bilateral femoral heads and acetabulum, left greater than right. Small femoral inferior marginal osteophytes. Labrum: Grossly intact, but evaluation is limited by lack of intraarticular fluid. Joint or bursal effusion Joint effusion: Large left hip joint effusion with severe synovitis and enhancement. Small right hip joint effusion with synovitis and enhancement. No SI joint effusion. Bursae:  No bursa formation. Muscles and tendons Muscle edema in the left obturator internus muscle with a 13 mm fluid collection posteriorly within the muscle concerning for a small abscess. Muscle edema and mild enhancement in the adductor musculature bilaterally and right iliacus muscle. Muscle edema and enhancement of bilateral multifidus muscles. Other findings Miscellaneous: No pelvic free fluid. No fluid collection or hematoma. No inguinal lymphadenopathy. No inguinal hernia. IMPRESSION: 1. Large left hip joint effusion with enhancing synovitis, pericapsular edema and severe marrow edema on either side of the joint most concerning for septic arthritis. 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 2. Small right hip joint effusion with enhancing synovitis and pericapsular edema and enhancement most concerning for septic arthritis. 3. Muscle edema within the adductor musculature bilaterally and multifidus muscles bilaterally most concerning for infectious myositis. 13 mm fluid collection in the posterior aspect of the left obturator internus muscle most concerning for a small abscess. Electronically Signed   By: Kathreen Devoid   On: 10/02/2017 11:36   Mr Knee Left Wo Contrast  Result Date: 09/21/2017 CLINICAL DATA:  32 year old man with active IV drug abuse, who presents with generalized pain. History is limited secondary to his acute illness including pain and fluctuations in alertness EXAM: MRI OF THE LEFT KNEE WITHOUT  CONTRAST TECHNIQUE: Multiplanar, multisequence MR imaging of the knee was performed. No intravenous contrast was administered. COMPARISON:  None. FINDINGS: MENISCI Medial meniscus:  Intact. Lateral meniscus:  Intact. LIGAMENTS Cruciates:  Intact ACL and PCL. Collaterals: Medial collateral ligament is intact. Lateral collateral ligament complex is intact. CARTILAGE Patellofemoral:  No chondral defect. Medial:  No chondral defect. Lateral:  No chondral defect. Joint: No joint effusion. Normal Hoffa's fat. No plical thickening. Popliteal Fossa: No Baker's cyst. Intact popliteus tendon. Mild soft tissue edema superficial to the lateral gastrocnemius muscle. Mild soft tissue edema deep to the distal biceps femoris muscle. Extensor Mechanism: Intact quadriceps tendon. Intact patellar tendon. Intact medial patellar retinaculum. Intact lateral patellar retinaculum. Intact MPFL. Bones: No focal marrow signal abnormality. No fracture or dislocation. Other: 4.8 x 0.8 x 4 cm complex fluid collection in the subcutaneous fat anterior to the patellar tendon with severe surrounding soft tissue edema most concerning for bursitis which may be secondarily infected. IMPRESSION: 1. 4.8 x 0.8 x 4 cm complex fluid collection in the subcutaneous fat anterior to the  patellar tendon with severe surrounding soft tissue edema most concerning for bursitis which may be secondarily infected. 2. No evidence of septic arthritis. Electronically Signed   By: Kathreen Devoid   On: 09/21/2017 09:02   Dg Chest Port 1 View  Result Date: 10/06/2017 CLINICAL DATA:  Fever and left upper chest pain for 1 week. EXAM: PORTABLE CHEST 1 VIEW COMPARISON:  Chest CTA 09/25/2017 FINDINGS: A right PICC terminates over the high right atrium. The cardiac silhouette is normal in size. Veiling opacity in the left mid and lower hemithorax is consistent with a persistent, small to moderate-sized pleural effusion. Mild parenchymal opacity in the left lung base likely  represents atelectasis. No right lung consolidation is seen. Nodular right lung opacities on the prior CT are not clearly apparent on this radiograph. No pneumothorax is identified. No acute osseous abnormality is seen. IMPRESSION: Left pleural effusion and mild left basilar atelectasis. Electronically Signed   By: Logan Bores M.D.   On: 10/06/2017 11:12   [place] Soft Tissue Non Vascular  Result Date: 09/19/2017 CLINICAL DATA:  Leg pain EXAM: ULTRASOUND RIGHT LOWER EXTREMITY LIMITED TECHNIQUE: Ultrasound examination of the lower extremity soft tissues was performed in the area of clinical concern. COMPARISON:  Four days prior FINDINGS: Medial distal thigh great saphenous vein thrombosis that appears complete. The wall appears thickened as well. A smaller and more superficial subcutaneous vein is also thrombosed. There is regional subcutaneous reticulation. No neighboring abscess. IMPRESSION: Great saphenous vein thrombosis in the lower right thigh. A smaller neighboring subcutaneous vein is also thrombosed. Probable underlying thrombophlebitis. Consider DVT study to evaluate proximal extent. Electronically Signed   By: Monte Fantasia M.D.   On: 09/19/2017 09:47   Dg Hips Bilat With Pelvis 2v  Result Date: 10/01/2017 CLINICAL DATA:  Hip pain. EXAM: DG HIP (WITH OR WITHOUT PELVIS) 2V BILAT COMPARISON:  None. FINDINGS: There are degenerative changes in the left hip with an osteophyte off the femoral neck. No significant loss of joint space. No fractures or dislocations identified. No other acute abnormalities. 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  No other abnormalities. Electronically Signed   By: Dorise Bullion III M.D   On: 10/01/2017 12:46   [place] Ekg Site Rite  Result Date: 09/25/2017 If Site Rite image not attached, placement could not be confirmed due to current cardiac rhythm.   Lab Data:  CBC: Recent Labs  Lab 10/12/17 0459 10/14/17 0422 10/16/17 1518  10/17/17 0438  WBC 9.3 12.4* 7.7 9.2  NEUTROABS 6.6  --   --   --   HGB 8.9* 9.3* 8.6* 9.0*  HCT 27.8* 28.7* 26.4* 27.4*  MCV 94.6 92.6 93.6 93.2  PLT 297 300 259 010   Basic Metabolic Panel: Recent Labs  Lab 10/12/17 0459 10/14/17 0422 10/16/17 1518 10/17/17 0438  NA 135 133* 130* 134*  K 3.9 4.1 3.6 4.3  CL 98* 95* 94* 95*  CO2 28 30 28 29   GLUCOSE 112* 115* 122* 108*  BUN 7 8 8 11   CREATININE 0.61 0.59* 0.77 0.63  CALCIUM 8.7* 8.9 8.1* 8.4*   GFR: Estimated Creatinine Clearance: 113.9 mL/min (by C-G formula based on SCr of 0.63 mg/dL). Liver Function Tests: No results for input(s): AST, ALT, ALKPHOS, BILITOT, PROT, ALBUMIN in the last 168 hours. No results for input(s): LIPASE, AMYLASE in the last 168 hours. No results for input(s): AMMONIA in the last 168 hours. Coagulation Profile: No results for input(s): INR, PROTIME in  the last 168 hours. Cardiac Enzymes: Recent Labs  Lab 10/13/17 1746  TROPONINI <0.03   BNP (last 3 results) No results for input(s): PROBNP in the last 8760 hours. HbA1C: No results for input(s): HGBA1C in the last 72 hours. CBG: No results for input(s): GLUCAP in the last 168 hours. Lipid Profile: No results for input(s): CHOL, HDL, LDLCALC, TRIG, CHOLHDL, LDLDIRECT in the last 72 hours. Thyroid Function Tests: No results for input(s): TSH, T4TOTAL, FREET4, T3FREE, THYROIDAB in the last 72 hours. Anemia Panel: No results for input(s): VITAMINB12, FOLATE, FERRITIN, TIBC, IRON, RETICCTPCT in the last 72 hours. Urine analysis:    Component Value Date/Time   COLORURINE YELLOW 10/06/2017 [place] 10/06/2017 1746   LABSPEC 1.017 10/06/2017 1746   PHURINE 7.0 10/06/2017 1746   GLUCOSEU NEGATIVE 10/06/2017 1746   HGBUR MODERATE (A) 10/06/2017 1746   BILIRUBINUR NEGATIVE 10/06/2017 1746   KETONESUR NEGATIVE 10/06/2017 1746   PROTEINUR NEGATIVE 10/06/2017 1746   UROBILINOGEN 0.2 05/16/2012 0847   NITRITE NEGATIVE  10/06/2017 1746   LEUKOCYTESUR NEGATIVE 10/06/2017 1746     Ripudeep Rai M.D. [facility] 10/17/2017, 11:07 AM  Pager: [phone] Between 7am to 7pm - call Pager - [phone]  After 7pm go to [email or web address] - password [email or web address]  Call night coverage person covering after 7pm

## 2017-10-17 NOTE — Progress Notes (Signed)
Physical Therapy Treatment Patient Details Name: Gary Hicks MRN: [id number] DOB: [birth year] Today's Date: 10/17/2017    History of Present Illness Pt is a 32 y/o male with active IV drug use presenting with generalized pain. Family is concerned about seizures due to finding him in fetal position with abnormal eye movements. He reports global polyarthralgia, use of heroin and methamphetamines in the past 24 hours at time of admission. Diagnosed with staph bacteremia, severe sepsis. PMH bacterial endocarditis, heroin abuse, possible Hepatitis C, opiate abuse, polysubstance abuse, seizures.  Pt also with positive right LE DVT on 2/18.  On Lovenox.  Pt is s/p I & D of bilat hips on 3/5.     PT Comments    Pt progressing towards functional mobility goals. Pt requiring min cues and encouragement to participate with PT today. He demonstrates improvements with bed mobility and transfers with increased time and utilizes bil UEs to assist L LE. He is ambulating with increased WB through LLE with RW. Functional mobility most limited by generalized pain with pt somewhat anxious about his pain at this time. Current plan remains appropriate. Will continue to follow acutely and progress as tolerated.   Follow Up Recommendations  Supervision/Assistance - 24 hour;SNF     Equipment Recommendations  Rolling walker with 5" wheels    Recommendations for Other Services       Precautions / Restrictions Precautions Precautions: Fall Restrictions Weight Bearing Restrictions: No RLE Weight Bearing: Weight bearing as tolerated LLE Weight Bearing: Weight bearing as tolerated    Mobility  Bed Mobility Overal bed mobility: Needs Assistance Bed Mobility: Supine to Sit;Sit to Supine     Supine to sit: Supervision Sit to supine: Supervision   General bed mobility comments: supervision for safety, increased time and effort required cue to pain. pt uses UEs to assist with moving L LE to  EOB  Transfers Overall transfer level: Needs assistance Equipment used: Rolling walker (2 wheeled) Transfers: Sit to/from Stand Sit to Stand: Supervision         General transfer comment: supervision for safety with increased effort and time due to pain.  Ambulation/Gait Ambulation/Gait assistance: Supervision Ambulation Distance (Feet): 140 Feet Assistive device: Rolling walker (2 wheeled) Gait Pattern/deviations: Step-to pattern;Antalgic;Decreased stride length Gait velocity: decreased Gait velocity interpretation: Below normal speed for age/gender General Gait Details: antalgic gait with increased WB through bil UEs on RW to off-load L LE. pt with partial WB (almost full WB on LLE towards end of walk) with knee slightly flexed throughout   Stairs            Wheelchair Mobility    Modified Rankin (Stroke Patients Only)       Balance Overall balance assessment: Needs assistance Sitting-balance support: Feet supported Sitting balance-Leahy Scale: Good     Standing balance support: Bilateral upper extremity supported Standing balance-Leahy Scale: Poor Standing balance comment: relies on UEs for support                            Cognition Arousal/Alertness: Awake/alert Behavior During Therapy: WFL for tasks assessed/performed Overall Cognitive Status: Within Functional Limits for tasks assessed                                        Exercises      General Comments General comments (skin integrity, edema, etc.): VSS  Pertinent Vitals/Pain Pain Assessment: 0-10 Pain Score: 8  Pain Location: all over especially L knee Pain Descriptors / Indicators: Aching;Sore Pain Intervention(s): Monitored during session;Limited activity within patient's tolerance;Repositioned    Home Living                      Prior Function            PT Goals (current goals can now be found in the care plan section) Acute Rehab PT  Goals Patient Stated Goal: to have less pain PT Goal Formulation: With patient/family Time For Goal Achievement: 10/21/17 Potential to Achieve Goals: Fair Progress towards PT goals: Progressing toward goals    Frequency    Min 3X/week      PT Plan Current plan remains appropriate    Co-evaluation              AM-PAC PT "6 Clicks" Daily Activity  Outcome Measure  Difficulty turning over in bed (including adjusting bedclothes, sheets and blankets)?: None Difficulty moving from lying on back to sitting on the side of the bed? : None Difficulty sitting down on and standing up from a chair with arms (e.g., wheelchair, bedside commode, etc,.)?: None Help needed moving to and from a bed to chair (including a wheelchair)?: None Help needed walking in hospital room?: None Help needed climbing 3-5 steps with a railing? : A Lot 6 Click Score: 22    End of Session Equipment Utilized During Treatment: Gait belt Activity Tolerance: Patient tolerated treatment well;Patient limited by pain Patient left: in bed;with call bell/phone within reach;with bed alarm set Nurse Communication: Mobility status PT Visit Diagnosis: Unsteadiness on feet (R26.81);Other abnormalities of gait and mobility (R26.89) Pain - Right/Left: Left Pain - part of body: Knee     Time: 4008-6761 PT Time Calculation (min) (ACUTE ONLY): 11 min  Charges:  $Gait Training: 8-22 mins                    G Codes:       [name], SPT   [name] 10/17/2017, 2:47 PM

## 2017-10-17 NOTE — Progress Notes (Signed)
Patient refused PICC line dsg change at this time.

## 2017-10-17 NOTE — Progress Notes (Signed)
PT Cancellation Note  Patient Details Name: Gary Hicks MRN: [id number] DOB: 1986/01/21   Cancelled Treatment:    Reason Eval/Treat Not Completed: Patient at procedure or test/unavailable(attempted x2 today, patient requested later visit upon return patient having blood draw and requested come back later in afternoon)   [name] J Renlee Floor 10/17/2017, 12:11 PM Alben Deeds, PT DPT  Board Certified Neurologic Specialist [phone]

## 2017-10-17 NOTE — Progress Notes (Signed)
Nutrition Follow-up  DOCUMENTATION CODES:   Not applicable  INTERVENTION:    Provide MVI daily  Ensure Enlive po TID, each supplement provides 350 kcal and 20 grams of protein  Continue to encourage family/friends to bring in food for patient if patient prefers this to hospital food  NUTRITION DIAGNOSIS:   Increased nutrient needs related to acute illness as evidenced by estimated needs.  Ongoing  GOAL:   Patient will meet greater than or equal to 90% of their needs  Not meeting  MONITOR:   PO intake, Supplement acceptance, Labs, Weight trends  REASON FOR ASSESSMENT:   Malnutrition Screening Tool    ASSESSMENT:   32 yo male admitted severe sepsis with bacterial endocarditis, MSSA bacteremia. Pt with active IV drug abuse (meth and heroin)   Pt reports appetite remains off and on. He has not had hospital food since last RD visit. Family is bringing 1-2 meals per [place] that consist of chicken sandwiches, chicken fingers, and pizza. RD observed a bag of [employer] and a box filled with chips at bedside. Pt reports he is drinking 3-4 Ensures per [place] and would like to continue them. Pt refused his Ensure this morning. Weight noted to be stable since last RD visit. Will continue with current interventions.   Medications reviewed and include: ferrous sulfate, MVi with minerals, Vit D Labs reviewed: Na 134 (L)   Diet Order:  Seizure precautions Diet regular Room service appropriate? Yes; Fluid consistency: Thin  EDUCATION NEEDS:   Education needs have been addressed  Skin:  Skin Assessment: (incision knee and hip)  Last BM:  10/16/17  Height:   Ht Readings from Last 1 Encounters:  09/23/17 5\' 9"  (1.753 m)    Weight:   Wt Readings from Last 1 Encounters:  10/16/17 132 lb 11.5 oz (60.2 kg)    Ideal Body Weight:  72.7 kg  BMI:  Body mass index is 19.6 kg/m.  Estimated Nutritional Needs:   Kcal:  2200-2400 kcals  Protein:  110-120 g  Fluid:  >/= 2  L    Mariana Single RD, LDN Clinical Nutrition Pager # [phone]

## 2017-10-18 LAB — CBC
HEMATOCRIT: 27.7 % — AB (ref 39.0–52.0)
Hemoglobin: 8.7 g/dL — ABNORMAL LOW (ref 13.0–17.0)
MCH: 29 pg (ref 26.0–34.0)
MCHC: 31.4 g/dL (ref 30.0–36.0)
MCV: 92.3 fL (ref 78.0–100.0)
Platelets: 278 10*3/uL (ref 150–400)
RBC: 3 MIL/uL — AB (ref 4.22–5.81)
RDW: 13.5 % (ref 11.5–15.5)
WBC: 12.4 10*3/uL — AB (ref 4.0–10.5)

## 2017-10-18 LAB — BASIC METABOLIC PANEL
Anion gap: 11 (ref 5–15)
BUN: 10 mg/dL (ref 6–20)
CHLORIDE: 93 mmol/L — AB (ref 101–111)
CO2: 29 mmol/L (ref 22–32)
Calcium: 8.5 mg/dL — ABNORMAL LOW (ref 8.9–10.3)
Creatinine, Ser: 0.71 mg/dL (ref 0.61–1.24)
GFR calc Af Amer: >60 mL/min (ref >60)
GFR calc non Af Amer: >60 mL/min (ref >60)
Glucose, Bld: 112 mg/dL — ABNORMAL HIGH (ref 65–99)
POTASSIUM: 4.1 mmol/L (ref 3.5–5.1)
Sodium: 133 mmol/L — ABNORMAL LOW (ref 135–145)

## 2017-10-18 LAB — PROCALCITONIN: Procalcitonin: 0.18 ng/mL

## 2017-10-18 MED ORDER — APIXABAN 5 MG PO TABS
5.0000 mg | ORAL_TABLET | Freq: Two times a day (BID) | ORAL | Status: DC
  Administered 2017-10-18 – 2017-11-05 (×37): 5 mg via ORAL
  Filled 2017-10-18 (×37): qty 1

## 2017-10-18 NOTE — Progress Notes (Signed)
ANTICOAGULATION CONSULT NOTE - Initial Consult  Pharmacy Consult for Apixaban Indication:DVT  Allergies  Allergen Reactions  . Prednisone Hives  . Amoxicillin Hives    Patient Measurements: Height: 5\' 9"  (175.3 cm) Weight: 132 lb 11.5 oz (60.2 kg) IBW/kg (Calculated) : 70.7   Vital Signs: Temp: 98.6 F (37 C) (03/19 0924) Temp Source: Oral (03/19 0924) BP: 99/64 (03/19 0924) Pulse Rate: 110 (03/19 0924)  Labs: Recent Labs    10/16/17 1518 10/17/17 0438  HGB 8.6* 9.0*  HCT 26.4* 27.4*  PLT 259 269  CREATININE 0.77 0.63    Estimated Creatinine Clearance: 113.9 mL/min (by C-G formula based on SCr of 0.63 mg/dL).   Medical History: Past Medical History:  Diagnosis Date  . Bacterial endocarditis   . Heroin abuse ([place])   . Liver disease    possible Hep. C no treatment or confirmation   . Opiate abuse, continuous ([place])   . Polysubstance abuse ([place])   . Seizures ([place])     Medications:    Assessment: 32 yo has been on enoxparin for DVT for >1 week. Patient has been refusing injections and has asked for oral anticoagulation. Asked by MD to start apixaban. Will not load with apixaban due to previous enoxaparin therapy.   Goal of Therapy:  Monitor CBC and PLT and renal function.    Plan:  Initiate apixaban 5mg  bid Will monitor CBC and renal function  Ilisa Hayworth A Brynda Heick 10/18/2017,9:52 AM

## 2017-10-18 NOTE — Progress Notes (Signed)
[facility]                                                                              Patient Demographics  Gary Hicks, is a 32 y.o. male, DOB - 10-06-1985, CBJ:[id number]  Admit date - 09/11/2017   Admitting Physician Quintella Baton, MD  Outpatient Primary MD for the patient is Patient, [name] Per  Outpatient specialists:   LOS - 37  days   Medical records reviewed and are as summarized below:    Chief Complaint  Patient presents with  . Weakness  . Leg Pain       Brief summary   32 year old male, active IVDA, who presents with generalized pain. Patient was incarcerated and released 3-4 weeks ago. Patient admits using IV methamphetamine as well as heroin in the past 24 hours prior to admission. Reports global polyarthralgias including his bilateral feet, bilateral knees, bilateral hips and shoulders. Also reports fevers and chills generalized myalgias. Pt admitted for further management. 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 Pt was also diagnosed with DVT Right gastrocnemius vein . He was started on Lovenox 2/18.He was also diagnosed with left knee septic infection. He underwent bursectomy and I and D by Dr Percell Miller 2-25.He reported worsening left hip pain 3-02, MRI showed, B/L hipSeptic arthritis and 13 mm fluid collection in the posterior aspect of the left obturator internus muscle most concerning for a small abscess.IR was consulted for arthrocentesis, patient couldn't tolerate procedure. Dr Percell Miller performed I and D of bilateral hip on 3/5.   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  Blood cultures 2/11 grew MSSA, repeat on 2/15 and 3/7 have been negative -2D echo showed large 1.1 cmx 1.8 cm multilobulated highly mobile vegetation on the right ventricular aspect of  the anterior leaflet of the tricuspid valve.  Moderate to severe tricuspid regurgitation. -Infectious disease was consulted, recommended continue IV Ancef for 6 weeks from 2/23 till 11/05/17 -History of IV drug use, currently on Suboxone, difficult placement hence patient will stay inpatient for the duration of antibiotics -CTA chest negative for PE, no chest pain currently -Spike low-grade fever last night, no leukocytosis, no complaints.  obtain CBC, BMET, pro-calcitonin  Septic left knee, bilateral hip joints -MRI of the left knee showed 4.8 x0.8x 4 cm fluid collection in the subcutaneous fat anterior to patella tendon with severe surrounding soft tissue edema concerning for septic arthritis. -MRI hip showed septic arthritis and 13 mm fluid collection in the posterior aspect of the left obturator internus muscle concerning for small abscess -Orthopedics was consulted (Dr Percell Miller).  Status post I&D and bursectomy of the left knee prepatellar bursitis on 09/23/17, status post I&D of septic arthritis bilateral hip on 10/04/17 -Cultures intraoperatively negative -MRI brain negative for abscess, Doppler of the left arm negative for DVT. -No complaints today   Left-sided pleural effusion -Seen on CT scan, moderate, patient has decided not to pursue thoracentesis at all. -Will DC therapeutic lovenox and placed on NOAC for  the right lower extremity DVT.  DVT right gastrocnemius vein with superficial saphenous thrombophlebitis -Recommended therapeutic Lovenox, patient refused Lovenox at times -Patient has not decided not to proceed with thoracentesis, will change to NOAC  History of IV drug use -Dr. Damita Dunnings managing Suboxone twice daily -No high-dose IV benzos unless required for seizures, no extra narcotics  Malnutrition -Continue nutritional supplements, dietitian was consulted  Generalized anxiety disorder -Psych was consulted, continue Zoloft 50 mg daily -May continue Klonopin as needed for 2  weeks and then wean off, not advised for long-term. -Psych was reconsulted for follow-up recommended increasing the Zoloft and tapering off the Klonopin  Seizures -Likely due to drug use, continue seizure precautions, as needed Ativan  -MRI brain negative for any abscess  Iron deficiency anemia -H&H stable, continue iron supplementation  Thrombocytopenia -Resolved  Hyponatremia, hypokalemia -Resolved  Hepatitis C antibody positive -ID following  Vitamin D deficiency - Continue supplementation  Constipation -Resolved, continue Senokot S, MiraLAX  Code Status: full  DVT Prophylaxis:  Lovenox   Family Communication: Discussed in detail with the patient, all imaging results, lab results explained to the patient    Disposition Plan: Will stay inpatient for the duration of IV antibiotics.     Time Spent in minutes 25 minutes  Procedures:   I&D of L knee on 09/23/17  I &D of bilateral hip on 10/04/17    Consultants:   Infectious disease Orthopedics  Antimicrobials:  IV Ancef till 11/05/17  Medications  Scheduled Meds: . apixaban  5 mg Oral BID  . bisacodyl  10 mg Rectal Once  . buprenorphine-naloxone  1 tablet Sublingual BID  . famotidine  20 mg Oral Daily  . feeding supplement (ENSURE ENLIVE)  237 mL Oral TID BM  . ferrous sulfate  325 mg Oral BID WC  . hydrocortisone cream   Topical BID  . multivitamin with minerals  1 tablet Oral Daily  . nicotine  14 mg Transdermal Daily  . polyethylene glycol  17 g Oral BID  . senna-docusate  1 tablet Oral BID  . sertraline  50 mg Oral Daily  . sodium chloride flush  10-40 mL Intracatheter Q12H  . Vitamin D (Ergocalciferol)  50,000 Units Oral Q Mon   Continuous Infusions: .  ceFAZolin (ANCEF) IV Stopped (10/18/17 6283)  . lactated ringers 10 mL/hr at 09/23/17 1329  . lactated ringers 10 mL/hr at 10/04/17 1237   PRN Meds:.acetaminophen, alum & mag hydroxide-simeth, clonazePAM, hydrOXYzine, ondansetron **OR**  ondansetron (ZOFRAN) IV, oxyCODONE, sodium chloride, sodium chloride flush, sodium chloride flush, traZODone   Antibiotics   Anti-infectives (From admission, onward)   Start     Dose/Rate Route Frequency Ordered Stop   09/23/17 0600  ceFAZolin (ANCEF) IVPB 2g/100 mL premix     2 g 200 mL/hr over 30 Minutes Intravenous To Surgery 09/22/17 1956 09/23/17 1436   09/23/17 0600  ceFAZolin (ANCEF) IVPB 2g/100 mL premix  Status:  Discontinued     2 g 200 mL/hr over 30 Minutes Intravenous On call to O.R. 09/22/17 1956 09/22/17 2002   09/15/17 1500  Oritavancin Diphosphate (ORBACTIV) 1,200 mg in dextrose 5 % IVPB     1,200 mg 333.3 mL/hr over 180 Minutes Intravenous Once 09/15/17 1357 09/15/17 2100   09/12/17 1400  ceFAZolin (ANCEF) IVPB 2g/100 mL premix     2 g 200 mL/hr over 30 Minutes Intravenous Every 8 hours 09/12/17 1300 11/05/17 2359   09/11/17 2100  vancomycin (VANCOCIN) IVPB 750 mg/150 ml premix  Status:  Discontinued     750 mg 150 mL/hr over 60 Minutes Intravenous Every 8 hours 09/11/17 2015 09/12/17 1300        Subjective:   Corene Cornea Dallman was seen and examined today.  Denies any complaints today, however spiking low-grade fevers overnight.  Currently on IV cefazolin, denies any chills, nausea, vomiting or any worsening pain.  Objective:   Vitals:   10/17/17 1015 10/17/17 2122 10/18/17 0506 10/18/17 0924  BP: 102/65 112/75 110/60 99/64  Pulse: (!) 113 (!) 116 (!) 120 (!) 110  Resp: 20 18 18 16   Temp: 99.2 F (37.3 C) (!) 100.9 F (38.3 C) (!) 100.5 F (38.1 C) 98.6 F (37 C)  TempSrc: Oral Oral Oral Oral  SpO2: 96% 97% 100% 95%  Weight:      Height:        Intake/Output Summary (Last 24 hours) at 10/18/2017 1142 Last data filed at 10/18/2017 1127 Gross per 24 hour  Intake 1180 ml  Output 500 ml  Net 680 ml     Wt Readings from Last 3 Encounters:  10/16/17 60.2 kg (132 lb 11.5 oz)  09/10/17 63.5 kg (140 lb)  01/05/15 63.5 kg (140 lb)     Exam   General:  Alert and oriented x 3, NAD  Eyes:   HEENT:  Atraumatic, normocephalic  Cardiovascular: S1 S2 auscultated, no rubs, murmurs or gallops. Regular rate and rhythm. No pedal edema b/l  Respiratory: Clear to auscultation bilaterally, no wheezing, rales or rhonchi  Gastrointestinal: Soft, nontender, nondistended, + bowel sounds  Ext: no pedal edema bilaterally  Neuro: no new deficits  Musculoskeletal: No digital cyanosis, clubbing  Skin:tatoos  Psych: Normal affect and demeanor, alert and oriented x3    Data Reviewed:  I have personally reviewed following labs and imaging studies  Micro Results No results found for this or any previous visit (from the past 240 hour(s)).  Radiology Reports Dg Chest 2 View  Result Date: 2020-07-118 CLINICAL DATA:  Chest pain. EXAM: CHEST - 2 VIEW COMPARISON:  Radiograph of October 06, 2017. FINDINGS: The heart size and mediastinal contours are within normal limits. No pneumothorax is noted. Right lung is clear. Right-sided PICC line is unchanged in position. Stable large left pleural effusion is noted with probable underlying atelectasis or infiltrate. The visualized skeletal structures are unremarkable. IMPRESSION: Stable large left pleural effusion is noted with probable underlying atelectasis or infiltrate. Electronically Signed   By: Marijo Conception, M.D.   On: 02020-07-118 15:53   Ct Angio Chest Pe W Or Wo Contrast  Result Date: 10/15/2017 CLINICAL DATA:  Evaluate for pulmonary embolus. Shortness of breath. EXAM: CT ANGIOGRAPHY CHEST WITH CONTRAST TECHNIQUE: Multidetector CT imaging of the chest was performed using the standard protocol during bolus administration of intravenous contrast. Multiplanar CT image reconstructions and MIPs were obtained to evaluate the vascular anatomy. CONTRAST:  150mL ISOVUE-370 IOPAMIDOL (ISOVUE-370) INJECTION 76% COMPARISON:  Chest CT 09/25/2017. FINDINGS: Cardiovascular: Normal heart size. No pericardial effusion. Normal  caliber thoracic aorta. Central venous catheter tip terminates in the superior vena cava. Adequate opacification of the pulmonary arterial system. Motion artifact limits evaluation. No filling defect identified to suggest acute pulmonary embolus. Mediastinum/Nodes: No enlarged axillary, mediastinal or hilar lymphadenopathy. Esophagus is normal in appearance. Lungs/Pleura: Central airways are patent. Endobronchial material within the mid and peripheral right lower lobe bronchi which may represent aspiration. Focal consolidation surrounding the peripheral right lower lobe bronchi. Large layering left pleural effusion. There is consolidation involving the left lower  lobe. 6 mm left upper lobe nodule (image 37; series 9), in the location of a prior cyst. 7 mm cystic change right upper lobe peripherally (image 59; series 9) in the area of a prior pulmonary nodule. Previously described right lower lobe nodule is significantly decreased in size measuring 8 mm (image 107; series 8), previously 13 mm. Upper Abdomen: Reflux of contrast into the hepatic veins. No acute process. Musculoskeletal: No aggressive or acute appearing osseous lesions. Review of the MIP images confirms the above findings. IMPRESSION: 1. No evidence of acute pulmonary embolus. 2. Interval decrease in size of many of the previously described pulmonary nodules, likely secondary to improving infectious/inflammatory process. 3. Endobronchial material within the peripheral right lower lobe bronchi with adjacent consolidation which may represent aspiration. 4. Interval increase in size of large left pleural effusion and left basilar atelectasis. 5. Reflux of contrast into the hepatic veins raising the possibility of right heart insufficiency. Electronically Signed   By: Lovey Newcomer M.D.   On: 10/15/2017 12:24   Ct Angio Chest Pe W Or Wo Contrast  Result Date: 09/25/2017 CLINICAL DATA:  32 year old male with LEFT LOWER extremity thrombosis. IV drug abuser.  Generalized pain. Fever chills and myalgias. EXAM: CT ANGIOGRAPHY CHEST WITH CONTRAST TECHNIQUE: Multidetector CT imaging of the chest was performed using the standard protocol during bolus administration of intravenous contrast. Multiplanar CT image reconstructions and MIPs were obtained to evaluate the vascular anatomy. CONTRAST:  147mL ISOVUE-370 IOPAMIDOL (ISOVUE-370) INJECTION 76% COMPARISON:  06/06/2016 and prior chest radiographs FINDINGS: Cardiovascular: This is a technically satisfactory study. No pulmonary emboli are identified. Old healed UPPER limits normal heart size noted. No evidence of thoracic aortic aneurysm or pericardial effusion. Mediastinum/Nodes: No enlarged mediastinal, hilar, or axillary lymph nodes. Thyroid gland, trachea, and esophagus demonstrate no significant findings. Lungs/Pleura: There are multiple irregular bilateral pulmonary nodules with index lesions as follows: An 8 x 13 mm SUPERIOR segment RIGHT LOWER lobe nodule (6:42) A 4 x 7 mm RIGHT UPPER lobe nodule (6:56) A 12 x 20 mm RIGHT LOWER lobe nodule (6:88) A 28 x 22 mm LEFT LOWER lobe nodule (6:91). These nodules are nonspecific but most likely represent infection or possibly septic emboli. Malignancy/metastatic disease is considered less likely. A small LEFT pleural effusion and LEFT basilar atelectasis noted. Upper Abdomen: No acute abnormality. Musculoskeletal: No acute abnormality or suspicious bony lesion. Review of the MIP images confirms the above findings. IMPRESSION: 1. Multiple bilateral pulmonary nodules as described. Favor infection or possibly septic emboli. Other inflammatory processes or malignancy/metastatic disease considered less likely. 2. Small LEFT pleural effusion and LEFT basilar atelectasis 3. No evidence of pulmonary emboli or thoracic aortic aneurysm. Electronically Signed   By: Margarette Canada M.D.   On: 09/25/2017 18:42   Mr Cervical Spine Wo Contrast  Result Date: 09/20/2017 CLINICAL DATA:   Generalized pain, hand numbness and weakness. History of endocarditis and intravenous drug abuse. EXAM: MRI CERVICAL SPINE WITHOUT CONTRAST TECHNIQUE: Multiplanar, multisequence MR imaging of the cervical spine was performed. No intravenous contrast was administered. COMPARISON:  MRI of the head September 18, 2017 FINDINGS: ALIGNMENT: Straightened cervical lordosis.  No malalignment. VERTEBRAE/DISCS: Vertebral bodies are intact. Intervertebral disc morphology's and signal are normal. Mild C6-7 ventral endplate spurring. No abnormal or acute bone marrow signal. CORD:Cervical spinal cord is normal morphology and signal characteristics from the cervicomedullary junction to level of T1-2, the most caudal well visualized level. POSTERIOR FOSSA, VERTEBRAL ARTERIES, PARASPINAL TISSUES: No MR findings of ligamentous injury. Vertebral artery  flow voids present. Included posterior fossa and paraspinal soft tissues are normal. 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 DISC LEVELS (axial sequences mild to moderately motion degraded): C2-3 through C7-T1: No disc bulge, canal stenosis nor neural foraminal narrowing. Mild C3-4 through C5-6 facet arthropathy. IMPRESSION: 1. Motion degraded examination. 2. No fracture, malalignment or acute osseous process. No MR findings of infection by noncontrast MRI. 3. Early degenerative change of the cervical spine without canal stenosis or neural foraminal narrowing. Electronically Signed   By: Elon Alas M.D.   On: 09/20/2017 23:16   Mr Hip Right W Wo Contrast  Result Date: 10/02/2017 CLINICAL DATA:  Bilateral hip and back pain.  Left worse than right. EXAM: MRI OF THE LEFT HIP WITHOUT AND WITH CONTRAST TECHNIQUE: Multiplanar, multisequence MR imaging was performed both before and after administration of intravenous contrast. CONTRAST:  80mL MULTIHANCE GADOBENATE DIMEGLUMINE 529 MG/ML IV SOLN COMPARISON:  None. FINDINGS: Bones: No hip fracture, dislocation or  avascular necrosis. Marrow edema in the left femoral head and acetabulum. Mild marrow edema in the anterior right acetabulum. Bilateral pericapsular edema and enhancement. No periosteal reaction or bone destruction. No aggressive osseous lesion. Normal sacrum and sacroiliac joints. No SI joint widening or erosive changes. Articular cartilage and labrum Articular cartilage: Partial-thickness bilateral femoral heads and acetabulum, left greater than right. Small femoral inferior marginal osteophytes. Labrum: Grossly intact, but evaluation is limited by lack of intraarticular fluid. Joint or bursal effusion Joint effusion: Large left hip joint effusion with severe synovitis and enhancement. Small right hip joint effusion with synovitis and enhancement. No SI joint effusion. Bursae:  No bursa formation. Muscles and tendons Muscle edema in the left obturator internus muscle with a 13 mm fluid collection posteriorly within the muscle concerning for a small abscess. Muscle edema and mild enhancement in the adductor musculature bilaterally and right iliacus muscle. Muscle edema and enhancement of bilateral multifidus muscles. Other findings Miscellaneous: No pelvic free fluid. No fluid collection or hematoma. No inguinal lymphadenopathy. No inguinal hernia. IMPRESSION: 1. Large left hip joint effusion with enhancing synovitis, pericapsular edema and severe marrow edema on either side of the joint most concerning for septic arthritis. 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 2. Small right hip joint effusion with enhancing synovitis and pericapsular edema and enhancement most concerning for septic arthritis. 3. Muscle edema within the adductor musculature bilaterally and multifidus muscles bilaterally most concerning for infectious myositis. 13 mm fluid collection in the posterior aspect of the left obturator internus muscle most concerning for a small abscess. Electronically Signed   By: Kathreen Devoid   On: 10/02/2017 11:36   Mr Hip Left W Wo Contrast  Result Date: 10/02/2017 CLINICAL DATA:  Bilateral hip and back pain.  Left worse than right. EXAM: MRI OF THE LEFT HIP WITHOUT AND WITH CONTRAST TECHNIQUE: Multiplanar, multisequence MR imaging was performed both before and after administration of intravenous contrast. CONTRAST:  27mL MULTIHANCE GADOBENATE DIMEGLUMINE 529 MG/ML IV SOLN COMPARISON:  None. FINDINGS: Bones: No hip fracture, dislocation or avascular necrosis. Marrow edema in the left femoral head and acetabulum. Mild marrow edema in the anterior right acetabulum. Bilateral pericapsular edema and enhancement. No periosteal reaction or bone destruction. No aggressive osseous lesion. Normal sacrum and sacroiliac joints. No SI joint widening or erosive changes. Articular cartilage and labrum Articular cartilage: Partial-thickness bilateral femoral heads and acetabulum, left greater than right. Small femoral inferior marginal osteophytes. Labrum: Grossly intact, but evaluation is limited by lack of  intraarticular fluid. Joint or bursal effusion Joint effusion: Large left hip joint effusion with severe synovitis and enhancement. Small right hip joint effusion with synovitis and enhancement. No SI joint effusion. Bursae:  No bursa formation. Muscles and tendons Muscle edema in the left obturator internus muscle with a 13 mm fluid collection posteriorly within the muscle concerning for a small abscess. Muscle edema and mild enhancement in the adductor musculature bilaterally and right iliacus muscle. Muscle edema and enhancement of bilateral multifidus muscles. Other findings Miscellaneous: No pelvic free fluid. No fluid collection or hematoma. No inguinal lymphadenopathy. No inguinal hernia. IMPRESSION: 1. Large left hip joint effusion with enhancing synovitis, pericapsular edema and severe marrow edema on either side of the joint most concerning for septic arthritis. There is underlying  mild-moderate osteoarthritis of the left hip. 2. Small right hip joint effusion with enhancing synovitis and pericapsular edema and enhancement most concerning for septic arthritis. 3. Muscle edema within the adductor musculature bilaterally and multifidus muscles bilaterally most concerning for infectious myositis. 13 mm fluid collection in the posterior aspect of the left obturator internus muscle most concerning for a small abscess. Electronically Signed   By: Kathreen Devoid   On: 10/02/2017 11:36   Mr Knee Left Wo Contrast  Result Date: 09/21/2017 CLINICAL DATA:  32 year old man with active IV drug abuse, who presents with generalized pain. History is limited secondary to his acute illness including pain and fluctuations in alertness EXAM: MRI OF THE LEFT KNEE WITHOUT CONTRAST TECHNIQUE: Multiplanar, multisequence MR imaging of the knee was performed. No intravenous contrast was administered. COMPARISON:  None. FINDINGS: MENISCI Medial meniscus:  Intact. Lateral meniscus:  Intact. LIGAMENTS Cruciates:  Intact ACL and PCL. Collaterals: Medial collateral ligament is intact. Lateral collateral ligament complex is intact. CARTILAGE Patellofemoral:  No chondral defect. Medial:  No chondral defect. Lateral:  No chondral defect. Joint: No joint effusion. Normal Hoffa's fat. No plical thickening. Popliteal Fossa: No Baker's cyst. Intact popliteus tendon. Mild soft tissue edema superficial to the lateral gastrocnemius muscle. Mild soft tissue edema deep to the distal biceps femoris muscle. Extensor Mechanism: Intact quadriceps tendon. Intact patellar tendon. Intact medial patellar retinaculum. Intact lateral patellar retinaculum. Intact MPFL. Bones: No focal marrow signal abnormality. No fracture or dislocation. Other: 4.8 x 0.8 x 4 cm complex fluid collection in the subcutaneous fat anterior to the patellar tendon with severe surrounding soft tissue edema most concerning for bursitis which may be secondarily infected.  IMPRESSION: 1. 4.8 x 0.8 x 4 cm complex fluid collection in the subcutaneous fat anterior to the patellar tendon with severe surrounding soft tissue edema most concerning for bursitis which may be secondarily infected. 2. No evidence of septic arthritis. Electronically Signed   By: Kathreen Devoid   On: 09/21/2017 09:02   Dg Chest Port 1 View  Result Date: 10/06/2017 CLINICAL DATA:  Fever and left upper chest pain for 1 week. EXAM: PORTABLE CHEST 1 VIEW COMPARISON:  Chest CTA 09/25/2017 FINDINGS: A right PICC terminates over the high right atrium. The cardiac silhouette is normal in size. Veiling opacity in the left mid and lower hemithorax is consistent with a persistent, small to moderate-sized pleural effusion. Mild parenchymal opacity in the left lung base likely represents atelectasis. No right lung consolidation is seen. Nodular right lung opacities on the prior CT are not clearly apparent on this radiograph. No pneumothorax is identified. No acute osseous abnormality is seen. IMPRESSION: Left pleural effusion and mild left basilar atelectasis. Electronically Signed   By:  Logan Bores M.D.   On: 10/06/2017 11:12   [place] Soft Tissue Non Vascular  Result Date: 09/19/2017 CLINICAL DATA:  Leg pain EXAM: ULTRASOUND RIGHT LOWER EXTREMITY LIMITED TECHNIQUE: Ultrasound examination of the lower extremity soft tissues was performed in the area of clinical concern. COMPARISON:  Four days prior FINDINGS: Medial distal thigh great saphenous vein thrombosis that appears complete. The wall appears thickened as well. A smaller and more superficial subcutaneous vein is also thrombosed. There is regional subcutaneous reticulation. No neighboring abscess. IMPRESSION: Great saphenous vein thrombosis in the lower right thigh. A smaller neighboring subcutaneous vein is also thrombosed. Probable underlying thrombophlebitis. Consider DVT study to evaluate proximal extent. Electronically Signed   By: Monte Fantasia M.D.   On: 09/19/2017 09:47   Dg Hips Bilat With Pelvis 2v  Result Date: 10/01/2017 CLINICAL DATA:  Hip pain. EXAM: DG HIP (WITH OR WITHOUT PELVIS) 2V BILAT COMPARISON:  None. FINDINGS: There are degenerative changes in the left hip with an osteophyte off the femoral neck. No significant loss of joint space. No fractures or dislocations identified. No other acute abnormalities. 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  No other abnormalities. Electronically Signed   By: Dorise Bullion III M.D   On: 10/01/2017 12:46   [place] Ekg Site Rite  Result Date: 09/25/2017 If Site Rite image not attached, placement could not be confirmed due to current cardiac rhythm.   Lab Data:  CBC: Recent Labs  Lab 10/12/17 0459 10/14/17 0422 10/16/17 1518 10/17/17 0438  WBC 9.3 12.4* 7.7 9.2  NEUTROABS 6.6  --   --   --   HGB 8.9* 9.3* 8.6* 9.0*  HCT 27.8* 28.7* 26.4* 27.4*  MCV 94.6 92.6 93.6 93.2  PLT 297 300 259 829   Basic Metabolic Panel: Recent Labs  Lab 10/12/17 0459 10/14/17 0422 10/16/17 1518 10/17/17 0438  NA 135 133* 130* 134*  K 3.9 4.1 3.6 4.3  CL 98* 95* 94* 95*  CO2 28 30 28 29   GLUCOSE 112* 115* 122* 108*  BUN 7 8 8 11   CREATININE 0.61 0.59* 0.77 0.63  CALCIUM 8.7* 8.9 8.1* 8.4*   GFR: Estimated Creatinine Clearance: 113.9 mL/min (by C-G formula based on SCr of 0.63 mg/dL). Liver Function Tests: No results for input(s): AST, ALT, ALKPHOS, BILITOT, PROT, ALBUMIN in the last 168 hours. No results for input(s): LIPASE, AMYLASE in the last 168 hours. No results for input(s): AMMONIA in the last 168 hours. Coagulation Profile: No results for input(s): INR, PROTIME in the last 168 hours. Cardiac Enzymes: Recent Labs  Lab 10/13/17 1746  TROPONINI <0.03   BNP (last 3 results) No results for input(s): PROBNP in the last 8760 hours. HbA1C: No results for input(s): HGBA1C in the last 72 hours. CBG: No results for input(s): GLUCAP in the last 168  hours. Lipid Profile: No results for input(s): CHOL, HDL, LDLCALC, TRIG, CHOLHDL, LDLDIRECT in the last 72 hours. Thyroid Function Tests: No results for input(s): TSH, T4TOTAL, FREET4, T3FREE, THYROIDAB in the last 72 hours. Anemia Panel: No results for input(s): VITAMINB12, FOLATE, FERRITIN, TIBC, IRON, RETICCTPCT in the last 72 hours. Urine analysis:    Component Value Date/Time   COLORURINE YELLOW 10/06/2017 [place] 10/06/2017 1746   LABSPEC 1.017 10/06/2017 1746   PHURINE 7.0 10/06/2017 1746   GLUCOSEU NEGATIVE 10/06/2017 1746   HGBUR MODERATE (A) 10/06/2017 1746   BILIRUBINUR NEGATIVE 10/06/2017 1746   KETONESUR NEGATIVE 10/06/2017 1746  PROTEINUR NEGATIVE 10/06/2017 1746   UROBILINOGEN 0.2 05/16/2012 0847   NITRITE NEGATIVE 10/06/2017 1746   LEUKOCYTESUR NEGATIVE 10/06/2017 1746     Shivam Mestas M.D. [facility] 10/18/2017, 11:42 AM  Pager: [phone] Between 7am to 7pm - call Pager - [phone]  After 7pm go to [email or web address] - password [email or web address]  Call night coverage person covering after 7pm

## 2017-10-18 NOTE — Discharge Instructions (Signed)
Left Knee Pre-Patellar Bursitis: Elevate left leg.   Maintain dressings until follow up.   Keep ace wrap on for compression. You may bear weight as tolerated.  Call for appointment to be seen in the office by Dr. Alain Marion in 1-2 weeks.  Information on my medicine - ELIQUIS (apixaban)  This medication education was reviewed with me or my healthcare representative as part of my discharge preparation.    Why was Eliquis prescribed for you? Eliquis was prescribed for you to reduce the risk of a blood clots forming   What do You need to know about Eliquis ? Take your Eliquis TWICE DAILY - one tablet in the morning and one tablet in the evening with or without food.  It would be best to take the doses about the same time each [place].  If you have difficulty swallowing the tablet whole please discuss with your pharmacist how to take the medication safely.  Take Eliquis exactly as prescribed by your doctor and DO NOT stop taking Eliquis without talking to the doctor who prescribed the medication.  Stopping may increase your risk of developing a new clot or stroke.  Refill your prescription before you run out.  After discharge, you should have regular check-up appointments with your healthcare provider that is prescribing your Eliquis.  In the future your dose may need to be changed if your kidney function or weight changes by a significant amount or as you get older.  What do you do if you miss a dose? If you miss a dose, take it as soon as you remember on the same [place] and resume taking twice daily.  Do not take more than one dose of ELIQUIS at the same time.  Important Safety Information A possible side effect of Eliquis is bleeding. You should call your healthcare provider right away if you experience any of the following: ? Bleeding from an injury or your nose that does not stop. ? Unusual colored urine (red or dark brown) or unusual colored stools (red or black). ? Unusual bruising  for unknown reasons. ? A serious fall or if you hit your head (even if there is no bleeding).  Some medicines may interact with Eliquis and might increase your risk of bleeding or clotting while on Eliquis. To help avoid this, consult your healthcare provider or pharmacist prior to using any new prescription or non-prescription medications, including herbals, vitamins, non-steroidal anti-inflammatory drugs (NSAIDs) and supplements.  This website has more information on Eliquis (apixaban): [email or web address].

## 2017-10-19 ENCOUNTER — Inpatient Hospital Stay (HOSPITAL_COMMUNITY): Payer: Self-pay

## 2017-10-19 DIAGNOSIS — R5383 Other fatigue: Secondary | ICD-10-CM

## 2017-10-19 DIAGNOSIS — M7052 Other bursitis of knee, left knee: Secondary | ICD-10-CM

## 2017-10-19 DIAGNOSIS — R110 Nausea: Secondary | ICD-10-CM

## 2017-10-19 DIAGNOSIS — F1190 Opioid use, unspecified, uncomplicated: Secondary | ICD-10-CM

## 2017-10-19 DIAGNOSIS — I079 Rheumatic tricuspid valve disease, unspecified: Secondary | ICD-10-CM

## 2017-10-19 DIAGNOSIS — Z95828 Presence of other vascular implants and grafts: Secondary | ICD-10-CM

## 2017-10-19 DIAGNOSIS — J90 Pleural effusion, not elsewhere classified: Secondary | ICD-10-CM

## 2017-10-19 DIAGNOSIS — I2690 Septic pulmonary embolism without acute cor pulmonale: Secondary | ICD-10-CM

## 2017-10-19 DIAGNOSIS — R05 Cough: Secondary | ICD-10-CM

## 2017-10-19 DIAGNOSIS — R5381 Other malaise: Secondary | ICD-10-CM

## 2017-10-19 DIAGNOSIS — F1121 Opioid dependence, in remission: Secondary | ICD-10-CM | POA: Diagnosis present

## 2017-10-19 DIAGNOSIS — R509 Fever, unspecified: Secondary | ICD-10-CM | POA: Diagnosis not present

## 2017-10-19 DIAGNOSIS — M009 Pyogenic arthritis, unspecified: Secondary | ICD-10-CM

## 2017-10-19 NOTE — Progress Notes (Signed)
[facility]                                                                              Patient Demographics  Gary Hicks, is a 32 y.o. male, DOB - [birth year], HQI:[id number]  Admit date - 09/11/2017   Admitting Physician Quintella Baton, MD  Outpatient Primary MD for the patient is Patient, [name] Per  Outpatient specialists:   LOS - 38  days   Medical records reviewed and are as summarized below:    Chief Complaint  Patient presents with  . Weakness  . Leg Pain       Brief summary   32 year old male, active IVDA, who presents with generalized pain. Patient was incarcerated and released 3-4 weeks ago. Patient admits using IV methamphetamine as well as heroin in the past 24 hours prior to admission. Reports global polyarthralgias including his bilateral feet, bilateral knees, bilateral hips and shoulders. Also reports fevers and chills generalized myalgias. Pt admitted for further management. 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 Pt was also diagnosed with DVT Right gastrocnemius vein . He was started on Lovenox 2/18.He was also diagnosed with left knee septic infection. He underwent bursectomy and I and D by Dr Percell Miller 2-25.He reported worsening left hip pain 3-02, MRI showed, B/L hipSeptic arthritis and 13 mm fluid collection in the posterior aspect of the left obturator internus muscle most concerning for a small abscess.IR was consulted for arthrocentesis, patient couldn't tolerate procedure. Dr Percell Miller performed I and D of bilateral hip on 3/5.   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  Blood cultures 2/11 grew MSSA, repeat on 2/15 and 3/7 have been negative -2D echo showed large 1.1 cmx 1.8 cm multilobulated highly mobile vegetation on the right ventricular aspect of  the anterior leaflet of the tricuspid valve.  Moderate to severe tricuspid regurgitation. -Infectious disease was consulted, recommended continue IV Ancef for 6 weeks from 2/23 till 11/05/17 -History of IV drug use, currently on Suboxone, difficult placement hence patient will stay inpatient for the duration of antibiotics -CTA chest negative for PE, no chest pain currently -Noted intermittent low-grade fevers since 3/18.  No overt symptoms to suggest new source.  Discussed with Dr. Megan Salon, ID and his input appreciated >checking repeat blood cultures, chest x-ray and continue cefazolin. -Chest x-ray shows persistent LLL atelectasis/pneumonia with moderate left pleural effusion and new infiltrate in right lower lobe.  However patient lacks pneumonia symptoms.  Discuss with patient in a.m. regarding thoracentesis and defer antibiotic management to ID.  Septic left knee, bilateral hip joints -MRI of the left knee showed 4.8 x0.8x 4 cm fluid collection in the subcutaneous fat anterior to patella tendon with severe surrounding soft tissue edema concerning for septic arthritis. -MRI hip showed septic arthritis and 13 mm fluid collection in the posterior aspect of the left obturator internus muscle concerning for small abscess -Orthopedics was consulted (Dr Percell Miller).  Status post I&D and bursectomy of the left knee prepatellar bursitis on 09/23/17,  status post I&D of septic arthritis bilateral hip on 10/04/17 -Cultures intraoperatively negative -MRI brain negative for abscess, Doppler of the left arm negative for DVT. -No complaints today   Left-sided pleural effusion -Seen on CT scan, moderate, patient has decided not to pursue thoracentesis at all. -Will DC therapeutic lovenox and placed on NOAC for the right lower extremity DVT.  Apixaban will need to be held if patient agrees to thoracentesis.  He has refused thoracentesis in the past.  DVT right gastrocnemius vein with superficial saphenous  thrombophlebitis -Recommended therapeutic Lovenox, patient refused Lovenox at times -Patient has not decided not to proceed with thoracentesis, had been changed to NOAC  History of IV drug use -Dr. Damita Dunnings managing Suboxone twice daily -No high-dose IV benzos unless required for seizures, no extra narcotics  Malnutrition -Continue nutritional supplements, dietitian was consulted  Generalized anxiety disorder -Psych was consulted, continue Zoloft 50 mg daily -May continue Klonopin as needed for 2 weeks and then wean off, not advised for long-term. -Psych was reconsulted for follow-up recommended increasing the Zoloft and tapering off the Klonopin  Seizures -Likely due to drug use, continue seizure precautions, as needed Ativan  -MRI brain negative for any abscess  Iron deficiency anemia -H&H stable, continue iron supplementation  Thrombocytopenia -Resolved  Hyponatremia, hypokalemia -Resolved  Hepatitis C antibody positive -ID following  Vitamin D deficiency - Continue supplementation  Constipation -Resolved, continue Senokot S, MiraLAX  Code Status: full  DVT Prophylaxis: Eliquis  Family Communication: Discussed in detail with the patient, all imaging results, lab results explained to the patient    Disposition Plan: Will stay inpatient for the duration of IV antibiotics, end date 11/05/17.     Time Spent in minutes 25 minutes  Procedures:   I&D of L knee on 09/23/17  I &D of bilateral hip on 10/04/17    Consultants:   Infectious disease Orthopedics  Antimicrobials:  IV Ancef till 11/05/17  Medications  Scheduled Meds: . apixaban  5 mg Oral BID  . bisacodyl  10 mg Rectal Once  . buprenorphine-naloxone  1 tablet Sublingual BID  . famotidine  20 mg Oral Daily  . feeding supplement (ENSURE ENLIVE)  237 mL Oral TID BM  . ferrous sulfate  325 mg Oral BID WC  . hydrocortisone cream   Topical BID  . multivitamin with minerals  1 tablet Oral Daily  .  nicotine  14 mg Transdermal Daily  . polyethylene glycol  17 g Oral BID  . senna-docusate  1 tablet Oral BID  . sertraline  50 mg Oral Daily  . sodium chloride flush  10-40 mL Intracatheter Q12H  . Vitamin D (Ergocalciferol)  50,000 Units Oral Q Mon   Continuous Infusions: .  ceFAZolin (ANCEF) IV Stopped (10/19/17 1414)  . lactated ringers 10 mL/hr at 09/23/17 1329  . lactated ringers 10 mL/hr at 10/04/17 1237   PRN Meds:.acetaminophen, alum & mag hydroxide-simeth, clonazePAM, hydrOXYzine, ondansetron **OR** ondansetron (ZOFRAN) IV, oxyCODONE, sodium chloride, sodium chloride flush, sodium chloride flush, traZODone   Antibiotics   Anti-infectives (From admission, onward)   Start     Dose/Rate Route Frequency Ordered Stop   09/23/17 0600  ceFAZolin (ANCEF) IVPB 2g/100 mL premix     2 g 200 mL/hr over 30 Minutes Intravenous To Surgery 09/22/17 1956 09/23/17 1436   09/23/17 0600  ceFAZolin (ANCEF) IVPB 2g/100 mL premix  Status:  Discontinued     2 g 200 mL/hr over 30 Minutes Intravenous On call to O.R. 09/22/17 1956  09/22/17 2002   09/15/17 1500  Oritavancin Diphosphate (ORBACTIV) 1,200 mg in dextrose 5 % IVPB     1,200 mg 333.3 mL/hr over 180 Minutes Intravenous Once 09/15/17 1357 09/15/17 2100   09/12/17 1400  ceFAZolin (ANCEF) IVPB 2g/100 mL premix     2 g 200 mL/hr over 30 Minutes Intravenous Every 8 hours 09/12/17 1300 11/05/17 2359   09/11/17 2100  vancomycin (VANCOCIN) IVPB 750 mg/150 ml premix  Status:  Discontinued     750 mg 150 mL/hr over 60 Minutes Intravenous Every 8 hours 09/11/17 2015 09/12/17 1300        Subjective:   Corene Cornea Culpepper was seen and examined today.  Noted ongoing low-grade fevers since 3/18.  Intermittent nausea without vomiting, abdominal pain or diarrhea.  Reports chronic bilateral shoulder pain which has not significantly changed since admission as per his report.  Denies any other source suggestive of cause for his fevers.  No skin rash or open  wounds.  Objective:   Vitals:   10/18/17 2300 10/19/17 0518 10/19/17 0920 10/19/17 1700  BP:  (!) 101/55 99/62 90/61   Pulse:  (!) 115 (!) 114 99  Resp:  18 20 18   Temp: 98.6 F (37 C) 99.6 F (37.6 C) (!) 101.8 F (38.8 C) 98.2 F (36.8 C)  TempSrc: Oral Oral Oral Oral  SpO2:  99% 96% 97%  Weight:      Height:        Intake/Output Summary (Last 24 hours) at 10/19/2017 1816 Last data filed at 10/19/2017 0519 Gross per 24 hour  Intake 890 ml  Output 700 ml  Net 190 ml     Wt Readings from Last 3 Encounters:  10/16/17 60.2 kg (132 lb 11.5 oz)  09/10/17 63.5 kg (140 lb)  01/05/15 63.5 kg (140 lb)     Exam   General: Alert and oriented x 3, NAD.  Does not appear septic or toxic.  Cardiovascular: S1 S2 auscultated, no rubs, murmurs or gallops. Regular rate and rhythm. No pedal edema b/l.  Telemetry personally reviewed: Sinus tachycardia in the 100s-110s.  Respiratory: Reduced breath sounds in the bases, left greater than sign right but no crackles, wheezing or rhonchi.  Rest of lung fields clear to auscultation.  Gastrointestinal: Soft, nontender, nondistended, + bowel sounds  Ext: no pedal edema bilaterally.  Right upper extremity PICC line site without acute findings.  Neuro: no new deficits.  Alert and oriented x3.  Musculoskeletal: No digital cyanosis, clubbing  Skin:tatoos  Psych: Flat affect., alert and oriented x3.  No focal neurological deficits.   Data Reviewed:  I have personally reviewed following labs and imaging studies  Micro Results No results found for this or any previous visit (from the past 240 hour(s)).  Radiology Reports Dg Chest 2 View  Result Date: 10/19/2017 CLINICAL DATA:  New onset of fever. Recent episodes of chest pain. History of endocarditis, substance abuse, current smoker. EXAM: CHEST - 2 VIEW COMPARISON:  Chest x-ray and chest CT scan of October 13, 2017 FINDINGS: The lungs are adequately inflated. There is new airspace opacity  in the right lower lung. There is persistent increased density in the left mid and lower lung with obscuration of the left hemidiaphragm. The heart is top-normal in size. The pulmonary vascularity is normal. The right PICC line tip projects over the distal third of the SVC. IMPRESSION: Persistent left lower lobe atelectasis or pneumonia with moderate-sized left pleural effusion. New infiltrate in the right lower lobe compatible with pneumonia.  Electronically Signed   By: David  Martinique M.D.   On: 10/19/2017 13:32   Dg Chest 2 View  Result Date: Jul 27, 202019 CLINICAL DATA:  Chest pain. EXAM: CHEST - 2 VIEW COMPARISON:  Radiograph of October 06, 2017. FINDINGS: The heart size and mediastinal contours are within normal limits. No pneumothorax is noted. Right lung is clear. Right-sided PICC line is unchanged in position. Stable large left pleural effusion is noted with probable underlying atelectasis or infiltrate. The visualized skeletal structures are unremarkable. IMPRESSION: Stable large left pleural effusion is noted with probable underlying atelectasis or infiltrate. Electronically Signed   By: Marijo Conception, M.D.   On: 0Jul 27, 202019 15:53   Ct Angio Chest Pe W Or Wo Contrast  Result Date: 10/15/2017 CLINICAL DATA:  Evaluate for pulmonary embolus. Shortness of breath. EXAM: CT ANGIOGRAPHY CHEST WITH CONTRAST TECHNIQUE: Multidetector CT imaging of the chest was performed using the standard protocol during bolus administration of intravenous contrast. Multiplanar CT image reconstructions and MIPs were obtained to evaluate the vascular anatomy. CONTRAST:  16mL ISOVUE-370 IOPAMIDOL (ISOVUE-370) INJECTION 76% COMPARISON:  Chest CT 09/25/2017. FINDINGS: Cardiovascular: Normal heart size. No pericardial effusion. Normal caliber thoracic aorta. Central venous catheter tip terminates in the superior vena cava. Adequate opacification of the pulmonary arterial system. Motion artifact limits evaluation. No filling defect  identified to suggest acute pulmonary embolus. Mediastinum/Nodes: No enlarged axillary, mediastinal or hilar lymphadenopathy. Esophagus is normal in appearance. Lungs/Pleura: Central airways are patent. Endobronchial material within the mid and peripheral right lower lobe bronchi which may represent aspiration. Focal consolidation surrounding the peripheral right lower lobe bronchi. Large layering left pleural effusion. There is consolidation involving the left lower lobe. 6 mm left upper lobe nodule (image 37; series 9), in the location of a prior cyst. 7 mm cystic change right upper lobe peripherally (image 59; series 9) in the area of a prior pulmonary nodule. Previously described right lower lobe nodule is significantly decreased in size measuring 8 mm (image 107; series 8), previously 13 mm. Upper Abdomen: Reflux of contrast into the hepatic veins. No acute process. Musculoskeletal: No aggressive or acute appearing osseous lesions. Review of the MIP images confirms the above findings. IMPRESSION: 1. No evidence of acute pulmonary embolus. 2. Interval decrease in size of many of the previously described pulmonary nodules, likely secondary to improving infectious/inflammatory process. 3. Endobronchial material within the peripheral right lower lobe bronchi with adjacent consolidation which may represent aspiration. 4. Interval increase in size of large left pleural effusion and left basilar atelectasis. 5. Reflux of contrast into the hepatic veins raising the possibility of right heart insufficiency. Electronically Signed   By: Lovey Newcomer M.D.   On: 10/15/2017 12:24   Ct Angio Chest Pe W Or Wo Contrast  Result Date: 09/25/2017 CLINICAL DATA:  32 year old male with LEFT LOWER extremity thrombosis. IV drug abuser. Generalized pain. Fever chills and myalgias. EXAM: CT ANGIOGRAPHY CHEST WITH CONTRAST TECHNIQUE: Multidetector CT imaging of the chest was performed using the standard protocol during bolus  administration of intravenous contrast. Multiplanar CT image reconstructions and MIPs were obtained to evaluate the vascular anatomy. CONTRAST:  153mL ISOVUE-370 IOPAMIDOL (ISOVUE-370) INJECTION 76% COMPARISON:  06/06/2016 and prior chest radiographs FINDINGS: Cardiovascular: This is a technically satisfactory study. No pulmonary emboli are identified. Old healed UPPER limits normal heart size noted. No evidence of thoracic aortic aneurysm or pericardial effusion. Mediastinum/Nodes: No enlarged mediastinal, hilar, or axillary lymph nodes. Thyroid gland, trachea, and esophagus demonstrate no significant findings. Lungs/Pleura: There are  multiple irregular bilateral pulmonary nodules with index lesions as follows: An 8 x 13 mm SUPERIOR segment RIGHT LOWER lobe nodule (6:42) A 4 x 7 mm RIGHT UPPER lobe nodule (6:56) A 12 x 20 mm RIGHT LOWER lobe nodule (6:88) A 28 x 22 mm LEFT LOWER lobe nodule (6:91). These nodules are nonspecific but most likely represent infection or possibly septic emboli. Malignancy/metastatic disease is considered less likely. A small LEFT pleural effusion and LEFT basilar atelectasis noted. Upper Abdomen: No acute abnormality. Musculoskeletal: No acute abnormality or suspicious bony lesion. Review of the MIP images confirms the above findings. IMPRESSION: 1. Multiple bilateral pulmonary nodules as described. Favor infection or possibly septic emboli. Other inflammatory processes or malignancy/metastatic disease considered less likely. 2. Small LEFT pleural effusion and LEFT basilar atelectasis 3. No evidence of pulmonary emboli or thoracic aortic aneurysm. Electronically Signed   By: Margarette Canada M.D.   On: 09/25/2017 18:42   Mr Cervical Spine Wo Contrast  Result Date: 09/20/2017 CLINICAL DATA:  Generalized pain, hand numbness and weakness. History of endocarditis and intravenous drug abuse. EXAM: MRI CERVICAL SPINE WITHOUT CONTRAST TECHNIQUE: Multiplanar, multisequence MR imaging of the  cervical spine was performed. No intravenous contrast was administered. COMPARISON:  MRI of the head September 18, 2017 FINDINGS: ALIGNMENT: Straightened cervical lordosis.  No malalignment. VERTEBRAE/DISCS: Vertebral bodies are intact. Intervertebral disc morphology's and signal are normal. Mild C6-7 ventral endplate spurring. No abnormal or acute bone marrow signal. CORD:Cervical spinal cord is normal morphology and signal characteristics from the cervicomedullary junction to level of T1-2, the most caudal well visualized level. POSTERIOR FOSSA, VERTEBRAL ARTERIES, PARASPINAL TISSUES: No MR findings of ligamentous injury. Vertebral artery flow voids present. Included posterior fossa and paraspinal soft tissues are normal. 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 DISC LEVELS (axial sequences mild to moderately motion degraded): C2-3 through C7-T1: No disc bulge, canal stenosis nor neural foraminal narrowing. Mild C3-4 through C5-6 facet arthropathy. IMPRESSION: 1. Motion degraded examination. 2. No fracture, malalignment or acute osseous process. No MR findings of infection by noncontrast MRI. 3. Early degenerative change of the cervical spine without canal stenosis or neural foraminal narrowing. Electronically Signed   By: Elon Alas M.D.   On: 09/20/2017 23:16   Mr Hip Right W Wo Contrast  Result Date: 10/02/2017 CLINICAL DATA:  Bilateral hip and back pain.  Left worse than right. EXAM: MRI OF THE LEFT HIP WITHOUT AND WITH CONTRAST TECHNIQUE: Multiplanar, multisequence MR imaging was performed both before and after administration of intravenous contrast. CONTRAST:  37mL MULTIHANCE GADOBENATE DIMEGLUMINE 529 MG/ML IV SOLN COMPARISON:  None. FINDINGS: Bones: No hip fracture, dislocation or avascular necrosis. Marrow edema in the left femoral head and acetabulum. Mild marrow edema in the anterior right acetabulum. Bilateral pericapsular edema and enhancement. No periosteal reaction or bone  destruction. No aggressive osseous lesion. Normal sacrum and sacroiliac joints. No SI joint widening or erosive changes. Articular cartilage and labrum Articular cartilage: Partial-thickness bilateral femoral heads and acetabulum, left greater than right. Small femoral inferior marginal osteophytes. Labrum: Grossly intact, but evaluation is limited by lack of intraarticular fluid. Joint or bursal effusion Joint effusion: Large left hip joint effusion with severe synovitis and enhancement. Small right hip joint effusion with synovitis and enhancement. No SI joint effusion. Bursae:  No bursa formation. Muscles and tendons Muscle edema in the left obturator internus muscle with a 13 mm fluid collection posteriorly within the muscle concerning for a small abscess. Muscle edema and mild enhancement in  the adductor musculature bilaterally and right iliacus muscle. Muscle edema and enhancement of bilateral multifidus muscles. Other findings Miscellaneous: No pelvic free fluid. No fluid collection or hematoma. No inguinal lymphadenopathy. No inguinal hernia. IMPRESSION: 1. Large left hip joint effusion with enhancing synovitis, pericapsular edema and severe marrow edema on either side of the joint most concerning for septic arthritis. 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 2. Small right hip joint effusion with enhancing synovitis and pericapsular edema and enhancement most concerning for septic arthritis. 3. Muscle edema within the adductor musculature bilaterally and multifidus muscles bilaterally most concerning for infectious myositis. 13 mm fluid collection in the posterior aspect of the left obturator internus muscle most concerning for a small abscess. Electronically Signed   By: Kathreen Devoid   On: 10/02/2017 11:36   Mr Hip Left W Wo Contrast  Result Date: 10/02/2017 CLINICAL DATA:  Bilateral hip and back pain.  Left worse than right. EXAM: MRI OF THE LEFT HIP WITHOUT AND WITH CONTRAST  TECHNIQUE: Multiplanar, multisequence MR imaging was performed both before and after administration of intravenous contrast. CONTRAST:  38mL MULTIHANCE GADOBENATE DIMEGLUMINE 529 MG/ML IV SOLN COMPARISON:  None. FINDINGS: Bones: No hip fracture, dislocation or avascular necrosis. Marrow edema in the left femoral head and acetabulum. Mild marrow edema in the anterior right acetabulum. Bilateral pericapsular edema and enhancement. No periosteal reaction or bone destruction. No aggressive osseous lesion. Normal sacrum and sacroiliac joints. No SI joint widening or erosive changes. Articular cartilage and labrum Articular cartilage: Partial-thickness bilateral femoral heads and acetabulum, left greater than right. Small femoral inferior marginal osteophytes. Labrum: Grossly intact, but evaluation is limited by lack of intraarticular fluid. Joint or bursal effusion Joint effusion: Large left hip joint effusion with severe synovitis and enhancement. Small right hip joint effusion with synovitis and enhancement. No SI joint effusion. Bursae:  No bursa formation. Muscles and tendons Muscle edema in the left obturator internus muscle with a 13 mm fluid collection posteriorly within the muscle concerning for a small abscess. Muscle edema and mild enhancement in the adductor musculature bilaterally and right iliacus muscle. Muscle edema and enhancement of bilateral multifidus muscles. Other findings Miscellaneous: No pelvic free fluid. No fluid collection or hematoma. No inguinal lymphadenopathy. No inguinal hernia. IMPRESSION: 1. Large left hip joint effusion with enhancing synovitis, pericapsular edema and severe marrow edema on either side of the joint most concerning for septic arthritis. 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 2. Small right hip joint effusion with enhancing synovitis and pericapsular edema and enhancement most concerning for septic arthritis. 3. Muscle edema within the adductor  musculature bilaterally and multifidus muscles bilaterally most concerning for infectious myositis. 13 mm fluid collection in the posterior aspect of the left obturator internus muscle most concerning for a small abscess. Electronically Signed   By: Kathreen Devoid   On: 10/02/2017 11:36   Mr Knee Left Wo Contrast  Result Date: 09/21/2017 CLINICAL DATA:  32 year old man with active IV drug abuse, who presents with generalized pain. History is limited secondary to his acute illness including pain and fluctuations in alertness EXAM: MRI OF THE LEFT KNEE WITHOUT CONTRAST TECHNIQUE: Multiplanar, multisequence MR imaging of the knee was performed. No intravenous contrast was administered. COMPARISON:  None. FINDINGS: MENISCI Medial meniscus:  Intact. Lateral meniscus:  Intact. LIGAMENTS Cruciates:  Intact ACL and PCL. Collaterals: Medial collateral ligament is intact. Lateral collateral ligament complex is intact. CARTILAGE Patellofemoral:  No chondral defect. Medial:  No chondral  defect. Lateral:  No chondral defect. Joint: No joint effusion. Normal Hoffa's fat. No plical thickening. Popliteal Fossa: No Baker's cyst. Intact popliteus tendon. Mild soft tissue edema superficial to the lateral gastrocnemius muscle. Mild soft tissue edema deep to the distal biceps femoris muscle. Extensor Mechanism: Intact quadriceps tendon. Intact patellar tendon. Intact medial patellar retinaculum. Intact lateral patellar retinaculum. Intact MPFL. Bones: No focal marrow signal abnormality. No fracture or dislocation. Other: 4.8 x 0.8 x 4 cm complex fluid collection in the subcutaneous fat anterior to the patellar tendon with severe surrounding soft tissue edema most concerning for bursitis which may be secondarily infected. IMPRESSION: 1. 4.8 x 0.8 x 4 cm complex fluid collection in the subcutaneous fat anterior to the patellar tendon with severe surrounding soft tissue edema most concerning for bursitis which may be secondarily  infected. 2. No evidence of septic arthritis. Electronically Signed   By: Kathreen Devoid   On: 09/21/2017 09:02   Dg Chest Port 1 View  Result Date: 10/06/2017 CLINICAL DATA:  Fever and left upper chest pain for 1 week. EXAM: PORTABLE CHEST 1 VIEW COMPARISON:  Chest CTA 09/25/2017 FINDINGS: A right PICC terminates over the high right atrium. The cardiac silhouette is normal in size. Veiling opacity in the left mid and lower hemithorax is consistent with a persistent, small to moderate-sized pleural effusion. Mild parenchymal opacity in the left lung base likely represents atelectasis. No right lung consolidation is seen. Nodular right lung opacities on the prior CT are not clearly apparent on this radiograph. No pneumothorax is identified. No acute osseous abnormality is seen. IMPRESSION: Left pleural effusion and mild left basilar atelectasis. Electronically Signed   By: Logan Bores M.D.   On: 10/06/2017 11:12   Dg Hips Bilat With Pelvis 2v  Result Date: 10/01/2017 CLINICAL DATA:  Hip pain. EXAM: DG HIP (WITH OR WITHOUT PELVIS) 2V BILAT COMPARISON:  None. FINDINGS: There are degenerative changes in the left hip with an osteophyte off the femoral neck. No significant loss of joint space. No fractures or dislocations identified. No other acute abnormalities. 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  No other abnormalities. Electronically Signed   By: Dorise Bullion III M.D   On: 10/01/2017 12:46   [place] Ekg Site Rite  Result Date: 09/25/2017 If Site Rite image not attached, placement could not be confirmed due to current cardiac rhythm.   Lab Data:  CBC: Recent Labs  Lab 10/14/17 0422 10/16/17 1518 10/17/17 0438 10/18/17 1654  WBC 12.4* 7.7 9.2 12.4*  HGB 9.3* 8.6* 9.0* 8.7*  HCT 28.7* 26.4* 27.4* 27.7*  MCV 92.6 93.6 93.2 92.3  PLT 300 259 269 431   Basic Metabolic Panel: Recent Labs  Lab 10/14/17 0422 10/16/17 1518 10/17/17 0438 10/18/17 1654  NA 133* 130* 134* 133*  K  4.1 3.6 4.3 4.1  CL 95* 94* 95* 93*  CO2 30 28 29 29   GLUCOSE 115* 122* 108* 112*  BUN 8 8 11 10   CREATININE 0.59* 0.77 0.63 0.71  CALCIUM 8.9 8.1* 8.4* 8.5*   GFR: Estimated Creatinine Clearance: 113.9 mL/min (by C-G formula based on SCr of 0.71 mg/dL). Liver Function Tests: No results for input(s): AST, ALT, ALKPHOS, BILITOT, PROT, ALBUMIN in the last 168 hours. No results for input(s): LIPASE, AMYLASE in the last 168 hours. No results for input(s): AMMONIA in the last 168 hours. Coagulation Profile: No results for input(s): INR, PROTIME in the last 168 hours. Cardiac Enzymes: Recent Labs  Lab 10/13/17 1746  TROPONINI <0.03   BNP (last 3 results) No results for input(s): PROBNP in the last 8760 hours. HbA1C: No results for input(s): HGBA1C in the last 72 hours. CBG: No results for input(s): GLUCAP in the last 168 hours. Lipid Profile: No results for input(s): CHOL, HDL, LDLCALC, TRIG, CHOLHDL, LDLDIRECT in the last 72 hours. Thyroid Function Tests: No results for input(s): TSH, T4TOTAL, FREET4, T3FREE, THYROIDAB in the last 72 hours. Anemia Panel: No results for input(s): VITAMINB12, FOLATE, FERRITIN, TIBC, IRON, RETICCTPCT in the last 72 hours. Urine analysis:    Component Value Date/Time   COLORURINE YELLOW 10/06/2017 [place] 10/06/2017 1746   LABSPEC 1.017 10/06/2017 1746   PHURINE 7.0 10/06/2017 1746   GLUCOSEU NEGATIVE 10/06/2017 1746   HGBUR MODERATE (A) 10/06/2017 1746   BILIRUBINUR NEGATIVE 10/06/2017 1746   KETONESUR NEGATIVE 10/06/2017 1746   PROTEINUR NEGATIVE 10/06/2017 1746   UROBILINOGEN 0.2 05/16/2012 0847   NITRITE NEGATIVE 10/06/2017 1746   LEUKOCYTESUR NEGATIVE 10/06/2017 [place], MD, FACP, FHM. [facility] Pager [phone]  If 7PM-7AM, please contact night-coverage [email or web address] Password [facility] 10/19/2017, 6:23 PM

## 2017-10-19 NOTE — Progress Notes (Signed)
PT Cancellation Note  Patient Details Name: Gary Hicks MRN: [id number] DOB: 1986/01/06   Cancelled Treatment:    Reason Eval/Treat Not Completed: Patient declined, no reason specified. Pt refusing to work with PT at this time, reporting he has a fever. PT will continue to f/u with pt as available.    [place] 10/19/2017, 3:42 PM

## 2017-10-19 NOTE — Consult Note (Signed)
[facility]    Date of Admission:  09/11/2017   Total days of antibiotics 39              Reason for Consult: Fever    Referring Provider: Dr. Vernell Leep  Assessment: The cause of his recent fevers is uncertain.  I see no evidence of drug allergy.  He has not developed any diarrhea to suggest C. difficile colitis.  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  I will continue cefazolin for now and check repeat blood cultures.  I will also check a chest x-ray to see if his large left pleural effusion persists.  If so, I would ask him to consider allowing thoracentesis.  If no source of his fever is forthcoming I would consider repeat MRI of his right shoulder.  I will follow with you.  Plan: 1. Continue cefazolin 2. Repeat blood cultures 3. PA and lateral chest x-ray  Principal Problem:   Fever Active Problems:   Bacteremia due to Staphylococcus aureus   Staphylococcal arthritis of right shoulder (HCC)   Septic infrapatellar bursitis of left knee   Staphylococcal arthritis of left hip (HCC)   Staphylococcal arthritis of right hip (HCC)   Endocarditis of tricuspid valve   Calf abscess   Heroin abuse (HCC)   Polysubstance abuse (HCC)   Severe sepsis (HCC)   Normocytic anemia   Cigarette smoker   Saphenous vein thrombophlebitis, right   Anxiety   IVDU (intravenous drug user)   Scheduled Meds: . apixaban  5 mg Oral BID  . bisacodyl  10 mg Rectal Once  . buprenorphine-naloxone  1 tablet Sublingual BID  . famotidine  20 mg Oral Daily  . feeding supplement (ENSURE ENLIVE)  237 mL Oral TID BM  . ferrous sulfate  325 mg Oral BID WC  . hydrocortisone cream   Topical BID  . multivitamin with minerals  1 tablet Oral Daily  . nicotine  14 mg Transdermal Daily  . polyethylene glycol  17 g Oral BID  . senna-docusate  1 tablet Oral BID  . sertraline  50 mg Oral Daily  .  sodium chloride flush  10-40 mL Intracatheter Q12H  . Vitamin D (Ergocalciferol)  50,000 Units Oral Q Mon   Continuous Infusions: .  ceFAZolin (ANCEF) IV Stopped (10/19/17 3710)  . lactated ringers 10 mL/hr at 09/23/17 1329  . lactated ringers 10 mL/hr at 10/04/17 1237   PRN Meds:.acetaminophen, alum & mag hydroxide-simeth, clonazePAM, hydrOXYzine, ondansetron **OR** ondansetron (ZOFRAN) IV, oxyCODONE, sodium chloride, sodium chloride flush, sodium chloride flush, traZODone  HPI: Gary Hicks is a 32 y.o. male who was admitted on 09/10/2017 with MSSA bacteremia complicated by tricuspid valve endocarditis, septic pulmonary emboli, right pleural effusion, and widely disseminated infection causing left knee septic prepatellar bursitis, left septic hip and right shoulder myositis.  He has now completed 39 days of IV antibiotic therapy.  Repeat blood cultures became negative by 09/16/2016.  He underwent incision and drainage of his left knee prepatellar bursa on 09/26/2017 and of his left hip on 10/04/2017.  He had been feeling better but then began to develop fever 2 nights ago.  He is having some increased aching pain in his right shoulder and some mild increase in nausea but otherwise has not noted any new complaints.   Review of Systems: Review of Systems  Constitutional: Positive  for chills, fever and malaise/fatigue. Negative for diaphoresis and weight loss.  HENT: Negative for congestion and sore throat.   Respiratory: Positive for cough. Negative for sputum production and shortness of breath.        He has mild, residual dry cough.  Cardiovascular: Negative for chest pain.       His left pleuritic chest pain has resolved.  Gastrointestinal: Positive for nausea. Negative for abdominal pain, diarrhea and vomiting.  Genitourinary: Negative for dysuria.  Musculoskeletal: Positive for joint pain. Negative for myalgias.       He has bilateral shoulder pain, right greater than left and that has  increased recently.  He still has some left hip pain but feels like this is slowly improving.  His left knee pain is markedly improved.  Skin: Negative for itching and rash.  Neurological: Negative for dizziness and headaches.    Past Medical History:  Diagnosis Date  . Bacterial endocarditis   . Heroin abuse ([place])   . Liver disease    possible Hep. C no treatment or confirmation   . Opiate abuse, continuous ([place])   . Polysubstance abuse ([place])   . Seizures ([place])     Social History   Tobacco Use  . Smoking status: Current Every Abraha Smoker    Packs/Apolinar: 0.50    Types: Cigarettes  . Smokeless tobacco: Current User  Substance Use Topics  . Alcohol use: No  . Drug use: Yes    Types: IV    Comment: heroin    History reviewed. No pertinent family history. Allergies  Allergen Reactions  . Prednisone Hives  . Amoxicillin Hives    OBJECTIVE: Blood pressure 99/62, pulse (!) 114, temperature (!) 101.8 F (38.8 C), temperature source Oral, resp. rate 20, height 5\' 9"  (1.753 m), weight 132 lb 11.5 oz (60.2 kg), SpO2 96 %.  Physical Exam  Constitutional: He is oriented to person, place, and time.  He is resting quietly in bed.  HENT:  Mouth/Throat: No oropharyngeal exudate.  Eyes: Conjunctivae are normal.  Neck: Neck supple.  Cardiovascular: Regular rhythm.  No murmur heard. He is tachycardic.  Pulmonary/Chest: Effort normal. He has no wheezes. He has no rales.  He has diminished breath sounds in left base posteriorly.  Abdominal: Soft. He exhibits no distension and no mass. There is no tenderness.  Musculoskeletal: Normal range of motion. He exhibits tenderness. He exhibits no edema.  He has pain with range of motion of both shoulders and left hip.  There is no unusual redness, swelling or warmth noted.  Left hip and knee incisions are healing nicely without evidence of active infection.  Neurological: He is alert and oriented to person, place, and time.  Skin: No rash  noted.  His PICC site appears normal.  He is heavily tattooed.  Psychiatric: Mood and affect normal.    Lab Results Lab Results  Component Value Date   WBC 12.4 (H) 10/18/2017   HGB 8.7 (L) 10/18/2017   HCT 27.7 (L) 10/18/2017   MCV 92.3 10/18/2017   PLT 278 10/18/2017    Lab Results  Component Value Date   CREATININE 0.71 10/18/2017   BUN 10 10/18/2017   NA 133 (L) 10/18/2017   K 4.1 10/18/2017   CL 93 (L) 10/18/2017   CO2 29 10/18/2017    Lab Results  Component Value Date   ALT 46 09/14/2017   AST 97 (H) 09/14/2017   ALKPHOS 91 09/14/2017   BILITOT 0.7 09/14/2017  Microbiology: No results found for this or any previous visit (from the past 240 hour(s)).  Michel Bickers, MD [facility] [phone] pager   [phone] cell 10/19/2017, 11:53 AM

## 2017-10-20 DIAGNOSIS — M549 Dorsalgia, unspecified: Secondary | ICD-10-CM

## 2017-10-20 DIAGNOSIS — R918 Other nonspecific abnormal finding of lung field: Secondary | ICD-10-CM

## 2017-10-20 DIAGNOSIS — J181 Lobar pneumonia, unspecified organism: Secondary | ICD-10-CM

## 2017-10-20 DIAGNOSIS — J189 Pneumonia, unspecified organism: Secondary | ICD-10-CM | POA: Insufficient documentation

## 2017-10-20 LAB — BASIC METABOLIC PANEL
ANION GAP: 11 (ref 5–15)
BUN: 9 mg/dL (ref 6–20)
CALCIUM: 8.1 mg/dL — AB (ref 8.9–10.3)
CO2: 26 mmol/L (ref 22–32)
Chloride: 93 mmol/L — ABNORMAL LOW (ref 101–111)
Creatinine, Ser: 0.58 mg/dL — ABNORMAL LOW (ref 0.61–1.24)
GFR calc Af Amer: >60 mL/min (ref >60)
GFR calc non Af Amer: >60 mL/min (ref >60)
Glucose, Bld: 119 mg/dL — ABNORMAL HIGH (ref 65–99)
Potassium: 4.1 mmol/L (ref 3.5–5.1)
Sodium: 130 mmol/L — ABNORMAL LOW (ref 135–145)

## 2017-10-20 LAB — CBC
HEMATOCRIT: 25.6 % — AB (ref 39.0–52.0)
HEMOGLOBIN: 8.2 g/dL — AB (ref 13.0–17.0)
MCH: 29.3 pg (ref 26.0–34.0)
MCHC: 32 g/dL (ref 30.0–36.0)
MCV: 91.4 fL (ref 78.0–100.0)
Platelets: 288 10*3/uL (ref 150–400)
RBC: 2.8 MIL/uL — ABNORMAL LOW (ref 4.22–5.81)
RDW: 13.7 % (ref 11.5–15.5)
WBC: 12.5 10*3/uL — ABNORMAL HIGH (ref 4.0–10.5)

## 2017-10-20 MED ORDER — SODIUM CHLORIDE 0.9 % IV SOLN
2.0000 g | Freq: Three times a day (TID) | INTRAVENOUS | Status: AC
  Administered 2017-10-20 – 2017-10-25 (×16): 2 g via INTRAVENOUS
  Filled 2017-10-20 (×17): qty 2

## 2017-10-20 NOTE — Progress Notes (Signed)
[facility]                                                                              Patient Demographics  Gary Hicks, is a 32 y.o. male, DOB - [birth year], GHW:[id number]  Admit date - 09/11/2017   Admitting Physician Quintella Baton, MD  Outpatient Primary MD for the patient is Patient, [name] Per  Outpatient specialists:   LOS - 39  days   Medical records reviewed and are as summarized below:    Chief Complaint  Patient presents with  . Weakness  . Leg Pain       Brief summary   32 year old male, active IVDA, who presents with generalized pain. Patient was incarcerated and released 3-4 weeks ago. Patient admits using IV methamphetamine as well as heroin in the past 24 hours prior to admission. Reports global polyarthralgias including his bilateral feet, bilateral knees, bilateral hips and shoulders. Also reports fevers and chills generalized myalgias. Pt admitted for further management. 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 Pt was also diagnosed with DVT Right gastrocnemius vein . He was started on Lovenox 2/18.He was also diagnosed with left knee septic infection. He underwent bursectomy and I and D by Dr Percell Miller 2-25.He reported worsening left hip pain 3-02, MRI showed, B/L hipSeptic arthritis and 13 mm fluid collection in the posterior aspect of the left obturator internus muscle most concerning for a small abscess.IR was consulted for arthrocentesis, patient couldn't tolerate procedure. Dr Percell Miller performed I and D of bilateral hip on 3/5.   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  Blood cultures 2/11 grew MSSA, repeat on 2/15 and 3/7 have been negative -2D echo showed large 1.1 cmx 1.8 cm multilobulated highly mobile vegetation on the right ventricular aspect of  the anterior leaflet of the tricuspid valve.  Moderate to severe tricuspid regurgitation. -Infectious disease was consulted, recommended continue IV Ancef for 6 weeks from 2/23 till 11/05/17 (Ancef changed to cefepime on 3/21 due to new fevers) -History of IV drug use, currently on Suboxone, difficult placement hence patient will stay inpatient for the duration of antibiotics -CTA chest negative for PE, no chest pain currently -Noted intermittent low-grade fevers since 3/18.  No overt symptoms to suggest new source.  Discussed with Dr. Megan Salon, ID and his input appreciated >checking repeat blood cultures, chest x-ray and continue cefazolin. -Chest x-ray 3/20 showed persistent LLL atelectasis/pneumonia with moderate left pleural effusion and new infiltrate in right lower lobe.  However patient lacks pneumonia symptoms.   -Discussed with Dr. Megan Salon 3/21.  As per ID follow-up, patient describes some dyspnea, cough and decreased appetite that is new over the last 3-4 days, chest x-ray with new RLL infiltrate compared to prior and improved left pleural effusion.  They have broadened antibiotics to IV cefepime to cover for HCAP in addition to existing MSSA bacteremia. -Patient continues to decline thoracentesis. -Monitor cultures and fever curve.  Septic left knee, bilateral hip joints -MRI of the left knee showed  4.8 x0.8x 4 cm fluid collection in the subcutaneous fat anterior to patella tendon with severe surrounding soft tissue edema concerning for septic arthritis. -MRI hip showed septic arthritis and 13 mm fluid collection in the posterior aspect of the left obturator internus muscle concerning for small abscess -Orthopedics was consulted (Dr Percell Miller).  Status post I&D and bursectomy of the left knee prepatellar bursitis on 09/23/17, status post I&D of septic arthritis bilateral hip on 10/04/17 -Cultures intraoperatively negative -MRI brain negative for abscess, Doppler of the left arm negative for  DVT. -No complaints today   Left-sided pleural effusion -Seen on CT scan, moderate, patient has decided not to pursue thoracentesis at all. -Will DC therapeutic lovenox and placed on NOAC for the right lower extremity DVT.  Apixaban will need to be held if patient agrees to thoracentesis.  He has refused thoracentesis in the past and again now.  DVT right gastrocnemius vein with superficial saphenous thrombophlebitis -Recommended therapeutic Lovenox, patient refused Lovenox at times -Patient has not decided not to proceed with thoracentesis, had been changed to NOAC  History of IV drug use -Dr. Damita Dunnings managing Suboxone twice daily -No high-dose IV benzos unless required for seizures, no extra narcotics  Malnutrition -Continue nutritional supplements, dietitian was consulted  Generalized anxiety disorder -Psych was consulted, continue Zoloft 50 mg daily -May continue Klonopin as needed for 2 weeks and then wean off, not advised for long-term. -Psych was reconsulted for follow-up recommended increasing the Zoloft and tapering off the Klonopin  Seizures -Likely due to drug use, continue seizure precautions, as needed Ativan  -MRI brain negative for any abscess  Iron deficiency anemia -H&H stable, continue iron supplementation  Thrombocytopenia -Resolved  Hyponatremia, hypokalemia -Resolved  Hepatitis C antibody positive -ID following  Vitamin D deficiency - Continue supplementation  Constipation -Resolved, continue Senokot S, MiraLAX  Code Status: full  DVT Prophylaxis: Eliquis  Family Communication: Discussed in detail with the patient, all imaging results, lab results explained to the patient    Disposition Plan: Will stay inpatient for the duration of IV antibiotics, end date 11/05/17.     Time Spent in minutes 25 minutes  Procedures:   I&D of L knee on 09/23/17  I &D of bilateral hip on 10/04/17    Consultants:   Infectious  disease Orthopedics  Antimicrobials:  IV Ancef till 11/05/17-3/21 Ancef now changed to cefepime on 3/21, await final workup and recommendations by ID.  Medications  Scheduled Meds: . apixaban  5 mg Oral BID  . bisacodyl  10 mg Rectal Once  . buprenorphine-naloxone  1 tablet Sublingual BID  . famotidine  20 mg Oral Daily  . feeding supplement (ENSURE ENLIVE)  237 mL Oral TID BM  . ferrous sulfate  325 mg Oral BID WC  . hydrocortisone cream   Topical BID  . multivitamin with minerals  1 tablet Oral Daily  . nicotine  14 mg Transdermal Daily  . polyethylene glycol  17 g Oral BID  . senna-docusate  1 tablet Oral BID  . sertraline  50 mg Oral Daily  . sodium chloride flush  10-40 mL Intracatheter Q12H  . Vitamin D (Ergocalciferol)  50,000 Units Oral Q Mon   Continuous Infusions: . ceFEPime (MAXIPIME) IV Stopped (10/20/17 1620)  . lactated ringers 10 mL/hr at 09/23/17 1329  . lactated ringers 10 mL/hr at 10/04/17 1237   PRN Meds:.acetaminophen, alum & mag hydroxide-simeth, clonazePAM, hydrOXYzine, ondansetron **OR** ondansetron (ZOFRAN) IV, oxyCODONE, sodium chloride, sodium chloride flush, sodium chloride flush, traZODone  Antibiotics   Anti-infectives (From admission, onward)   Start     Dose/Rate Route Frequency Ordered Stop   10/20/17 1600  ceFEPIme (MAXIPIME) 2 g in sodium chloride 0.9 % 100 mL IVPB     2 g 200 mL/hr over 30 Minutes Intravenous Every 8 hours 10/20/17 1517 10/25/17 1559   09/23/17 0600  ceFAZolin (ANCEF) IVPB 2g/100 mL premix     2 g 200 mL/hr over 30 Minutes Intravenous To Surgery 09/22/17 1956 09/23/17 1436   09/23/17 0600  ceFAZolin (ANCEF) IVPB 2g/100 mL premix  Status:  Discontinued     2 g 200 mL/hr over 30 Minutes Intravenous On call to O.R. 09/22/17 1956 09/22/17 2002   09/15/17 1500  Oritavancin Diphosphate (ORBACTIV) 1,200 mg in dextrose 5 % IVPB     1,200 mg 333.3 mL/hr over 180 Minutes Intravenous Once 09/15/17 1357 09/15/17 2100   09/12/17  1400  ceFAZolin (ANCEF) IVPB 2g/100 mL premix  Status:  Discontinued     2 g 200 mL/hr over 30 Minutes Intravenous Every 8 hours 09/12/17 1300 10/20/17 1517   09/11/17 2100  vancomycin (VANCOCIN) IVPB 750 mg/150 ml premix  Status:  Discontinued     750 mg 150 mL/hr over 60 Minutes Intravenous Every 8 hours 09/11/17 2015 09/12/17 1300        Subjective:   Patient reports ongoing low-grade fevers, up to 101.6 last night.  Some intermittent dyspnea, minimal dry cough.  Objective:   Vitals:   10/19/17 1700 10/19/17 2139 10/20/17 0542 10/20/17 1000  BP: 90/61 99/60 94/63  99/65  Pulse: 99 (!) 110 (!) 106 98  Resp: 18 19 20 18   Temp: 98.2 F (36.8 C) (!) 101.6 F (38.7 C) (!) 101.2 F (38.4 C) 98.3 F (36.8 C)  TempSrc: Oral Oral Oral Oral  SpO2: 97% 95% 92% 97%  Weight:  60.3 kg (132 lb 15 oz)    Height:        Intake/Output Summary (Last 24 hours) at 10/20/2017 1622 Last data filed at 10/20/2017 0600 Gross per 24 hour  Intake 1477 ml  Output 0 ml  Net 1477 ml     Wt Readings from Last 3 Encounters:  10/19/17 60.3 kg (132 lb 15 oz)  09/10/17 63.5 kg (140 lb)  01/05/15 63.5 kg (140 lb)     Exam: No change in exam compared to 3/20.   General: Alert and oriented x 3, NAD.  Does not appear septic or toxic.  Cardiovascular: S1 S2 auscultated, no rubs, murmurs or gallops. Regular rate and rhythm. No pedal edema b/l.  Telemetry personally reviewed: Sinus tachycardia in the 100s-110s.  Respiratory: Reduced breath sounds in the bases, left greater than sign right but no crackles, wheezing or rhonchi.  Rest of lung fields clear to auscultation.  Gastrointestinal: Soft, nontender, nondistended, + bowel sounds  Ext: no pedal edema bilaterally.  Right upper extremity PICC line site without acute findings.  Neuro: no new deficits.  Alert and oriented x3.  Musculoskeletal: No digital cyanosis, clubbing  Skin:tatoos  Psych: Flat affect., alert and oriented x3.  No focal  neurological deficits.   Data Reviewed:  I have personally reviewed following labs and imaging studies  Micro Results Recent Results (from the past 240 hour(s))  Culture, blood (routine x 2)     Status: None (Preliminary result)   Collection Time: 10/19/17  2:06 PM  Result Value Ref Range Status   Specimen Description BLOOD BLOOD LEFT WRIST  Final   Special Requests  Final    BOTTLES DRAWN AEROBIC ONLY Blood Culture results may not be optimal due to an inadequate volume of blood received in culture bottles   Culture   Final    NO GROWTH < 24 HOURS Performed at [facility] [address], [place], [place] [address]    Report Status PENDING  Incomplete    Radiology Reports Dg Chest 2 View  Result Date: 10/19/2017 CLINICAL DATA:  New onset of fever. Recent episodes of chest pain. History of endocarditis, substance abuse, current smoker. EXAM: CHEST - 2 VIEW COMPARISON:  Chest x-ray and chest CT scan of October 13, 2017 FINDINGS: The lungs are adequately inflated. There is new airspace opacity in the right lower lung. There is persistent increased density in the left mid and lower lung with obscuration of the left hemidiaphragm. The heart is top-normal in size. The pulmonary vascularity is normal. The right PICC line tip projects over the distal third of the SVC. IMPRESSION: Persistent left lower lobe atelectasis or pneumonia with moderate-sized left pleural effusion. New infiltrate in the right lower lobe compatible with pneumonia. Electronically Signed   By: David  Martinique M.D.   On: 10/19/2017 13:32   Dg Chest 2 View  Result Date: 10/26/202019 CLINICAL DATA:  Chest pain. EXAM: CHEST - 2 VIEW COMPARISON:  Radiograph of October 06, 2017. FINDINGS: The heart size and mediastinal contours are within normal limits. No pneumothorax is noted. Right lung is clear. Right-sided PICC line is unchanged in position. Stable large left pleural effusion is noted with probable underlying atelectasis or  infiltrate. The visualized skeletal structures are unremarkable. IMPRESSION: Stable large left pleural effusion is noted with probable underlying atelectasis or infiltrate. Electronically Signed   By: Marijo Conception, M.D.   On: 010/26/202019 15:53   Ct Angio Chest Pe W Or Wo Contrast  Result Date: 10/15/2017 CLINICAL DATA:  Evaluate for pulmonary embolus. Shortness of breath. EXAM: CT ANGIOGRAPHY CHEST WITH CONTRAST TECHNIQUE: Multidetector CT imaging of the chest was performed using the standard protocol during bolus administration of intravenous contrast. Multiplanar CT image reconstructions and MIPs were obtained to evaluate the vascular anatomy. CONTRAST:  122mL ISOVUE-370 IOPAMIDOL (ISOVUE-370) INJECTION 76% COMPARISON:  Chest CT 09/25/2017. FINDINGS: Cardiovascular: Normal heart size. No pericardial effusion. Normal caliber thoracic aorta. Central venous catheter tip terminates in the superior vena cava. Adequate opacification of the pulmonary arterial system. Motion artifact limits evaluation. No filling defect identified to suggest acute pulmonary embolus. Mediastinum/Nodes: No enlarged axillary, mediastinal or hilar lymphadenopathy. Esophagus is normal in appearance. Lungs/Pleura: Central airways are patent. Endobronchial material within the mid and peripheral right lower lobe bronchi which may represent aspiration. Focal consolidation surrounding the peripheral right lower lobe bronchi. Large layering left pleural effusion. There is consolidation involving the left lower lobe. 6 mm left upper lobe nodule (image 37; series 9), in the location of a prior cyst. 7 mm cystic change right upper lobe peripherally (image 59; series 9) in the area of a prior pulmonary nodule. Previously described right lower lobe nodule is significantly decreased in size measuring 8 mm (image 107; series 8), previously 13 mm. Upper Abdomen: Reflux of contrast into the hepatic veins. No acute process. Musculoskeletal: No  aggressive or acute appearing osseous lesions. Review of the MIP images confirms the above findings. IMPRESSION: 1. No evidence of acute pulmonary embolus. 2. Interval decrease in size of many of the previously described pulmonary nodules, likely secondary to improving infectious/inflammatory process. 3. Endobronchial material within the peripheral  right lower lobe bronchi with adjacent consolidation which may represent aspiration. 4. Interval increase in size of large left pleural effusion and left basilar atelectasis. 5. Reflux of contrast into the hepatic veins raising the possibility of right heart insufficiency. Electronically Signed   By: Lovey Newcomer M.D.   On: 10/15/2017 12:24   Ct Angio Chest Pe W Or Wo Contrast  Result Date: 09/25/2017 CLINICAL DATA:  32 year old male with LEFT LOWER extremity thrombosis. IV drug abuser. Generalized pain. Fever chills and myalgias. EXAM: CT ANGIOGRAPHY CHEST WITH CONTRAST TECHNIQUE: Multidetector CT imaging of the chest was performed using the standard protocol during bolus administration of intravenous contrast. Multiplanar CT image reconstructions and MIPs were obtained to evaluate the vascular anatomy. CONTRAST:  147mL ISOVUE-370 IOPAMIDOL (ISOVUE-370) INJECTION 76% COMPARISON:  06/06/2016 and prior chest radiographs FINDINGS: Cardiovascular: This is a technically satisfactory study. No pulmonary emboli are identified. Old healed UPPER limits normal heart size noted. No evidence of thoracic aortic aneurysm or pericardial effusion. Mediastinum/Nodes: No enlarged mediastinal, hilar, or axillary lymph nodes. Thyroid gland, trachea, and esophagus demonstrate no significant findings. Lungs/Pleura: There are multiple irregular bilateral pulmonary nodules with index lesions as follows: An 8 x 13 mm SUPERIOR segment RIGHT LOWER lobe nodule (6:42) A 4 x 7 mm RIGHT UPPER lobe nodule (6:56) A 12 x 20 mm RIGHT LOWER lobe nodule (6:88) A 28 x 22 mm LEFT LOWER lobe nodule (6:91).  These nodules are nonspecific but most likely represent infection or possibly septic emboli. Malignancy/metastatic disease is considered less likely. A small LEFT pleural effusion and LEFT basilar atelectasis noted. Upper Abdomen: No acute abnormality. Musculoskeletal: No acute abnormality or suspicious bony lesion. Review of the MIP images confirms the above findings. IMPRESSION: 1. Multiple bilateral pulmonary nodules as described. Favor infection or possibly septic emboli. Other inflammatory processes or malignancy/metastatic disease considered less likely. 2. Small LEFT pleural effusion and LEFT basilar atelectasis 3. No evidence of pulmonary emboli or thoracic aortic aneurysm. Electronically Signed   By: Margarette Canada M.D.   On: 09/25/2017 18:42   Mr Cervical Spine Wo Contrast  Result Date: 09/20/2017 CLINICAL DATA:  Generalized pain, hand numbness and weakness. History of endocarditis and intravenous drug abuse. EXAM: MRI CERVICAL SPINE WITHOUT CONTRAST TECHNIQUE: Multiplanar, multisequence MR imaging of the cervical spine was performed. No intravenous contrast was administered. COMPARISON:  MRI of the head September 18, 2017 FINDINGS: ALIGNMENT: Straightened cervical lordosis.  No malalignment. VERTEBRAE/DISCS: Vertebral bodies are intact. Intervertebral disc morphology's and signal are normal. Mild C6-7 ventral endplate spurring. No abnormal or acute bone marrow signal. CORD:Cervical spinal cord is normal morphology and signal characteristics from the cervicomedullary junction to level of T1-2, the most caudal well visualized level. POSTERIOR FOSSA, VERTEBRAL ARTERIES, PARASPINAL TISSUES: No MR findings of ligamentous injury. Vertebral artery flow voids present. Included posterior fossa and paraspinal soft tissues are normal. 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 DISC LEVELS (axial sequences mild to moderately motion degraded): C2-3 through C7-T1: No disc bulge, canal stenosis nor  neural foraminal narrowing. Mild C3-4 through C5-6 facet arthropathy. IMPRESSION: 1. Motion degraded examination. 2. No fracture, malalignment or acute osseous process. No MR findings of infection by noncontrast MRI. 3. Early degenerative change of the cervical spine without canal stenosis or neural foraminal narrowing. Electronically Signed   By: Elon Alas M.D.   On: 09/20/2017 23:16   Mr Hip Right W Wo Contrast  Result Date: 10/02/2017 CLINICAL DATA:  Bilateral hip and back pain.  Left worse than  right. EXAM: MRI OF THE LEFT HIP WITHOUT AND WITH CONTRAST TECHNIQUE: Multiplanar, multisequence MR imaging was performed both before and after administration of intravenous contrast. CONTRAST:  58mL MULTIHANCE GADOBENATE DIMEGLUMINE 529 MG/ML IV SOLN COMPARISON:  None. FINDINGS: Bones: No hip fracture, dislocation or avascular necrosis. Marrow edema in the left femoral head and acetabulum. Mild marrow edema in the anterior right acetabulum. Bilateral pericapsular edema and enhancement. No periosteal reaction or bone destruction. No aggressive osseous lesion. Normal sacrum and sacroiliac joints. No SI joint widening or erosive changes. Articular cartilage and labrum Articular cartilage: Partial-thickness bilateral femoral heads and acetabulum, left greater than right. Small femoral inferior marginal osteophytes. Labrum: Grossly intact, but evaluation is limited by lack of intraarticular fluid. Joint or bursal effusion Joint effusion: Large left hip joint effusion with severe synovitis and enhancement. Small right hip joint effusion with synovitis and enhancement. No SI joint effusion. Bursae:  No bursa formation. Muscles and tendons Muscle edema in the left obturator internus muscle with a 13 mm fluid collection posteriorly within the muscle concerning for a small abscess. Muscle edema and mild enhancement in the adductor musculature bilaterally and right iliacus muscle. Muscle edema and enhancement of  bilateral multifidus muscles. Other findings Miscellaneous: No pelvic free fluid. No fluid collection or hematoma. No inguinal lymphadenopathy. No inguinal hernia. IMPRESSION: 1. Large left hip joint effusion with enhancing synovitis, pericapsular edema and severe marrow edema on either side of the joint most concerning for septic arthritis. 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 2. Small right hip joint effusion with enhancing synovitis and pericapsular edema and enhancement most concerning for septic arthritis. 3. Muscle edema within the adductor musculature bilaterally and multifidus muscles bilaterally most concerning for infectious myositis. 13 mm fluid collection in the posterior aspect of the left obturator internus muscle most concerning for a small abscess. Electronically Signed   By: Kathreen Devoid   On: 10/02/2017 11:36   Mr Hip Left W Wo Contrast  Result Date: 10/02/2017 CLINICAL DATA:  Bilateral hip and back pain.  Left worse than right. EXAM: MRI OF THE LEFT HIP WITHOUT AND WITH CONTRAST TECHNIQUE: Multiplanar, multisequence MR imaging was performed both before and after administration of intravenous contrast. CONTRAST:  46mL MULTIHANCE GADOBENATE DIMEGLUMINE 529 MG/ML IV SOLN COMPARISON:  None. FINDINGS: Bones: No hip fracture, dislocation or avascular necrosis. Marrow edema in the left femoral head and acetabulum. Mild marrow edema in the anterior right acetabulum. Bilateral pericapsular edema and enhancement. No periosteal reaction or bone destruction. No aggressive osseous lesion. Normal sacrum and sacroiliac joints. No SI joint widening or erosive changes. Articular cartilage and labrum Articular cartilage: Partial-thickness bilateral femoral heads and acetabulum, left greater than right. Small femoral inferior marginal osteophytes. Labrum: Grossly intact, but evaluation is limited by lack of intraarticular fluid. Joint or bursal effusion Joint effusion: Large left hip  joint effusion with severe synovitis and enhancement. Small right hip joint effusion with synovitis and enhancement. No SI joint effusion. Bursae:  No bursa formation. Muscles and tendons Muscle edema in the left obturator internus muscle with a 13 mm fluid collection posteriorly within the muscle concerning for a small abscess. Muscle edema and mild enhancement in the adductor musculature bilaterally and right iliacus muscle. Muscle edema and enhancement of bilateral multifidus muscles. Other findings Miscellaneous: No pelvic free fluid. No fluid collection or hematoma. No inguinal lymphadenopathy. No inguinal hernia. IMPRESSION: 1. Large left hip joint effusion with enhancing synovitis, pericapsular edema and severe marrow edema on either side  of the joint most concerning for septic arthritis. 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 2. Small right hip joint effusion with enhancing synovitis and pericapsular edema and enhancement most concerning for septic arthritis. 3. Muscle edema within the adductor musculature bilaterally and multifidus muscles bilaterally most concerning for infectious myositis. 13 mm fluid collection in the posterior aspect of the left obturator internus muscle most concerning for a small abscess. Electronically Signed   By: Kathreen Devoid   On: 10/02/2017 11:36   Mr Knee Left Wo Contrast  Result Date: 09/21/2017 CLINICAL DATA:  32 year old man with active IV drug abuse, who presents with generalized pain. History is limited secondary to his acute illness including pain and fluctuations in alertness EXAM: MRI OF THE LEFT KNEE WITHOUT CONTRAST TECHNIQUE: Multiplanar, multisequence MR imaging of the knee was performed. No intravenous contrast was administered. COMPARISON:  None. FINDINGS: MENISCI Medial meniscus:  Intact. Lateral meniscus:  Intact. LIGAMENTS Cruciates:  Intact ACL and PCL. Collaterals: Medial collateral ligament is intact. Lateral collateral ligament  complex is intact. CARTILAGE Patellofemoral:  No chondral defect. Medial:  No chondral defect. Lateral:  No chondral defect. Joint: No joint effusion. Normal Hoffa's fat. No plical thickening. Popliteal Fossa: No Baker's cyst. Intact popliteus tendon. Mild soft tissue edema superficial to the lateral gastrocnemius muscle. Mild soft tissue edema deep to the distal biceps femoris muscle. Extensor Mechanism: Intact quadriceps tendon. Intact patellar tendon. Intact medial patellar retinaculum. Intact lateral patellar retinaculum. Intact MPFL. Bones: No focal marrow signal abnormality. No fracture or dislocation. Other: 4.8 x 0.8 x 4 cm complex fluid collection in the subcutaneous fat anterior to the patellar tendon with severe surrounding soft tissue edema most concerning for bursitis which may be secondarily infected. IMPRESSION: 1. 4.8 x 0.8 x 4 cm complex fluid collection in the subcutaneous fat anterior to the patellar tendon with severe surrounding soft tissue edema most concerning for bursitis which may be secondarily infected. 2. No evidence of septic arthritis. Electronically Signed   By: Kathreen Devoid   On: 09/21/2017 09:02   Dg Chest Port 1 View  Result Date: 10/06/2017 CLINICAL DATA:  Fever and left upper chest pain for 1 week. EXAM: PORTABLE CHEST 1 VIEW COMPARISON:  Chest CTA 09/25/2017 FINDINGS: A right PICC terminates over the high right atrium. The cardiac silhouette is normal in size. Veiling opacity in the left mid and lower hemithorax is consistent with a persistent, small to moderate-sized pleural effusion. Mild parenchymal opacity in the left lung base likely represents atelectasis. No right lung consolidation is seen. Nodular right lung opacities on the prior CT are not clearly apparent on this radiograph. No pneumothorax is identified. No acute osseous abnormality is seen. IMPRESSION: Left pleural effusion and mild left basilar atelectasis. Electronically Signed   By: Logan Bores M.D.   On:  10/06/2017 11:12   Dg Hips Bilat With Pelvis 2v  Result Date: 10/01/2017 CLINICAL DATA:  Hip pain. EXAM: DG HIP (WITH OR WITHOUT PELVIS) 2V BILAT COMPARISON:  None. FINDINGS: There are degenerative changes in the left hip with an osteophyte off the femoral neck. No significant loss of joint space. No fractures or dislocations identified. No other acute abnormalities. 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  No other abnormalities. Electronically Signed   By: Dorise Bullion III M.D   On: 10/01/2017 12:46   [place] Ekg Site Rite  Result Date: 09/25/2017 If Site Rite image not attached, placement could not be confirmed due to current cardiac  rhythm.   Lab Data:  CBC: Recent Labs  Lab 10/14/17 0422 10/16/17 1518 10/17/17 0438 10/18/17 1654 10/20/17 0426  WBC 12.4* 7.7 9.2 12.4* 12.5*  HGB 9.3* 8.6* 9.0* 8.7* 8.2*  HCT 28.7* 26.4* 27.4* 27.7* 25.6*  MCV 92.6 93.6 93.2 92.3 91.4  PLT 300 259 269 278 563   Basic Metabolic Panel: Recent Labs  Lab 10/14/17 0422 10/16/17 1518 10/17/17 0438 10/18/17 1654 10/20/17 0426  NA 133* 130* 134* 133* 130*  K 4.1 3.6 4.3 4.1 4.1  CL 95* 94* 95* 93* 93*  CO2 30 28 29 29 26   GLUCOSE 115* 122* 108* 112* 119*  BUN 8 8 11 10 9   CREATININE 0.59* 0.77 0.63 0.71 0.58*  CALCIUM 8.9 8.1* 8.4* 8.5* 8.1*   GFR: Estimated Creatinine Clearance: 114.1 mL/min (A) (by C-G formula based on SCr of 0.58 mg/dL (L)). Liver Function Tests: No results for input(s): AST, ALT, ALKPHOS, BILITOT, PROT, ALBUMIN in the last 168 hours. No results for input(s): LIPASE, AMYLASE in the last 168 hours. No results for input(s): AMMONIA in the last 168 hours. Coagulation Profile: No results for input(s): INR, PROTIME in the last 168 hours. Cardiac Enzymes: Recent Labs  Lab 10/13/17 1746  TROPONINI <0.03   BNP (last 3 results) No results for input(s): PROBNP in the last 8760 hours. HbA1C: No results for input(s): HGBA1C in the last 72 hours. CBG: No  results for input(s): GLUCAP in the last 168 hours. Lipid Profile: No results for input(s): CHOL, HDL, LDLCALC, TRIG, CHOLHDL, LDLDIRECT in the last 72 hours. Thyroid Function Tests: No results for input(s): TSH, T4TOTAL, FREET4, T3FREE, THYROIDAB in the last 72 hours. Anemia Panel: No results for input(s): VITAMINB12, FOLATE, FERRITIN, TIBC, IRON, RETICCTPCT in the last 72 hours. Urine analysis:    Component Value Date/Time   COLORURINE YELLOW 10/06/2017 [place] 10/06/2017 1746   LABSPEC 1.017 10/06/2017 1746   PHURINE 7.0 10/06/2017 1746   GLUCOSEU NEGATIVE 10/06/2017 1746   HGBUR MODERATE (A) 10/06/2017 1746   BILIRUBINUR NEGATIVE 10/06/2017 1746   KETONESUR NEGATIVE 10/06/2017 1746   PROTEINUR NEGATIVE 10/06/2017 1746   UROBILINOGEN 0.2 05/16/2012 0847   NITRITE NEGATIVE 10/06/2017 1746   LEUKOCYTESUR NEGATIVE 10/06/2017 [place], MD, FACP, FHM. [facility] Pager [phone]  If 7PM-7AM, please contact night-coverage [email or web address] Password [facility] 10/20/2017, 4:22 PM

## 2017-10-20 NOTE — Progress Notes (Signed)
Physical Therapy Treatment Patient Details Name: Gary Hicks MRN: [id number] DOB: 05-11-1986 Today's Date: 10/20/2017    History of Present Illness Pt is a 32 y/o male with active IV drug use presenting with generalized pain. Family is concerned about seizures due to finding him in fetal position with abnormal eye movements. He reports global polyarthralgia, use of heroin and methamphetamines in the past 24 hours at time of admission. Diagnosed with staph bacteremia, severe sepsis. PMH bacterial endocarditis, heroin abuse, possible Hepatitis C, opiate abuse, polysubstance abuse, seizures.  Pt also with positive right LE DVT on 2/18.  On Lovenox.  Pt is s/p I & D of bilat hips on 3/5.     PT Comments    Pt again limited secondary to pain this session. Pt also self-limiting and required max encouragement for participation. Pt only agreeable to sitting EOB and use of IS. Pt would continue to benefit from skilled physical therapy services at this time while admitted and after d/c to address the below listed limitations in order to improve overall safety and independence with functional mobility.    Follow Up Recommendations  Supervision/Assistance - 24 hour;SNF     Equipment Recommendations  Rolling walker with 5" wheels    Recommendations for Other Services       Precautions / Restrictions Precautions Precautions: Fall Restrictions Weight Bearing Restrictions: No    Mobility  Bed Mobility Overal bed mobility: Needs Assistance Bed Mobility: Supine to Sit;Sit to Supine     Supine to sit: Supervision Sit to supine: Supervision   General bed mobility comments: supervision for safety, increased time and effort required cue to pain. pt uses UEs to assist with moving L LE back onto bed  Transfers                 General transfer comment: pt refusing this session  Ambulation/Gait                 Stairs            Wheelchair Mobility    Modified Rankin  (Stroke Patients Only)       Balance Overall balance assessment: Needs assistance Sitting-balance support: Feet supported Sitting balance-Leahy Scale: Good                                      Cognition Arousal/Alertness: Awake/alert Behavior During Therapy: WFL for tasks assessed/performed Overall Cognitive Status: Within Functional Limits for tasks assessed                                        Exercises Other Exercises Other Exercises: pt performed breathing exercise with IS x5 with cueing    General Comments        Pertinent Vitals/Pain Pain Assessment: Faces Faces Pain Scale: Hurts even more Pain Location: generalized Pain Descriptors / Indicators: Aching;Sore Pain Intervention(s): Monitored during session;Repositioned    Home Living                      Prior Function            PT Goals (current goals can now be found in the care plan section) Acute Rehab PT Goals PT Goal Formulation: With patient/family Time For Goal Achievement: 10/21/17 Potential to Achieve Goals: Fair Progress towards PT goals:  Not progressing toward goals - comment(pt limited secondary to pain, and self-limiting)    Frequency    Min 3X/week      PT Plan Current plan remains appropriate    Co-evaluation              AM-PAC PT "6 Clicks" Daily Activity  Outcome Measure  Difficulty turning over in bed (including adjusting bedclothes, sheets and blankets)?: A Little Difficulty moving from lying on back to sitting on the side of the bed? : A Little Difficulty sitting down on and standing up from a chair with arms (e.g., wheelchair, bedside commode, etc,.)?: Unable Help needed moving to and from a bed to chair (including a wheelchair)?: A Little Help needed walking in hospital room?: A Little Help needed climbing 3-5 steps with a railing? : A Lot 6 Click Score: 15    End of Session   Activity Tolerance: Patient limited by  fatigue;Patient limited by pain;Other (comment)(self-limiting) Patient left: in bed;with call bell/phone within reach;Other (comment)(telesitter) Nurse Communication: Mobility status PT Visit Diagnosis: Unsteadiness on feet (R26.81);Other abnormalities of gait and mobility (R26.89) Pain - part of body: (generalized)     Time: 2080-2233 PT Time Calculation (min) (ACUTE ONLY): 14 min  Charges:  $Therapeutic Activity: 8-22 mins                    G Codes:       [place], [place], [place] 10/20/2017, 2:36 PM

## 2017-10-20 NOTE — Progress Notes (Signed)
[facility] for Infectious Disease  Date of Admission:  09/11/2017     Total days of antibiotics 40  Cefazolin [place] 40   ASSESSMENT: Fevers persistant overnight, wbc count elevated. He describes some shortness of breath, cough and decreased appetite that is new over the last 3-4 days. No other worsened pain at previously infected/debrided joints or alternative joints. CXR with new RLL infiltrate compared to previous study a week prior and persistent LLL effusion/infiltrate which was previously described to be large and now read to be moderate sized. Per my read I agree that his left appears improved.   PLAN: 1. Will broaden to cefepime for now to cover for potential emerging HCAP and his existing MSSA disseminated infection. He has a penicillin allergy so piperacillin-tazobactam not an option.  2. Will hold off on vancomycin for now considering his symptoms are fairly mild for now.   Principal Problem:   Fever Active Problems:   Bacteremia due to Staphylococcus aureus   Staphylococcal arthritis of right shoulder (HCC)   Septic infrapatellar bursitis of left knee   Staphylococcal arthritis of left hip (HCC)   Staphylococcal arthritis of right hip (HCC)   Endocarditis of tricuspid valve   Saphenous vein thrombophlebitis, right   Heroin abuse (HCC)   Polysubstance abuse (HCC)   Severe sepsis (HCC)   Normocytic anemia   Cigarette smoker   Calf abscess   Anxiety   IVDU (intravenous drug user)   . apixaban  5 mg Oral BID  . bisacodyl  10 mg Rectal Once  . buprenorphine-naloxone  1 tablet Sublingual BID  . famotidine  20 mg Oral Daily  . feeding supplement (ENSURE ENLIVE)  237 mL Oral TID BM  . ferrous sulfate  325 mg Oral BID WC  . hydrocortisone cream   Topical BID  . multivitamin with minerals  1 tablet Oral Daily  . nicotine  14 mg Transdermal Daily  . polyethylene glycol  17 g Oral BID  . senna-docusate  1 tablet Oral BID  . sertraline  50 mg Oral Daily    . sodium chloride flush  10-40 mL Intracatheter Q12H  . Vitamin D (Ergocalciferol)  50,000 Units Oral Q Mon    SUBJECTIVE: Feels OK but decreased appetite, mild cough (non-productive) and shortness of breath over the last 3-4 days. He has been up walking with physical therapy and no worsened joint pain. Is hopeful that these fevers will not alter his projected discharge date.    Allergies  Allergen Reactions  . Prednisone Hives  . Amoxicillin Hives    OBJECTIVE: Vitals:   10/19/17 1700 10/19/17 2139 10/20/17 0542 10/20/17 1000  BP: 90/61 99/60 94/63  99/65  Pulse: 99 (!) 110 (!) 106 98  Resp: 18 19 20 18   Temp: 98.2 F (36.8 C) (!) 101.6 F (38.7 C) (!) 101.2 F (38.4 C) 98.3 F (36.8 C)  TempSrc: Oral Oral Oral Oral  SpO2: 97% 95% 92% 97%  Weight:  132 lb 15 oz (60.3 kg)    Height:       Body mass index is 19.63 kg/m.  Physical Exam  Constitutional: He is oriented to person, place, and time and well-developed, well-nourished, and in no distress.  HENT:  Mouth/Throat: Oropharynx is clear and moist. No oral lesions. Normal dentition. No dental caries.  Eyes: Pupils are equal, round, and reactive to light. No scleral icterus.  Neck: Normal range of motion.  Cardiovascular: Normal rate, regular rhythm and normal  heart sounds.  No murmur heard. Pulmonary/Chest: Effort normal and breath sounds normal. No respiratory distress.  Generally diminished lung sounds.   Abdominal: Soft. He exhibits no distension. There is no tenderness.  Lymphadenopathy:    He has no cervical adenopathy.  Neurological: He is alert and oriented to person, place, and time.  Skin: Skin is warm and dry. No rash noted.  Psychiatric: Mood and affect normal.  Vitals reviewed.   Lab Results Lab Results  Component Value Date   WBC 12.5 (H) 10/20/2017   HGB 8.2 (L) 10/20/2017   HCT 25.6 (L) 10/20/2017   MCV 91.4 10/20/2017   PLT 288 10/20/2017    Lab Results  Component Value Date   CREATININE  0.58 (L) 10/20/2017   BUN 9 10/20/2017   NA 130 (L) 10/20/2017   K 4.1 10/20/2017   CL 93 (L) 10/20/2017   CO2 26 10/20/2017    Lab Results  Component Value Date   ALT 46 09/14/2017   AST 97 (H) 09/14/2017   ALKPHOS 91 09/14/2017   BILITOT 0.7 09/14/2017     Microbiology: No results found for this or any previous visit (from the past 240 hour(s)).  Janene Madeira, MSN, NP-C [facility] for Infectious [place] [facility] Cell: [phone] Pager: [phone]  10/20/2017  11:27 AM

## 2017-10-21 DIAGNOSIS — J181 Lobar pneumonia, unspecified organism: Secondary | ICD-10-CM

## 2017-10-21 DIAGNOSIS — R008 Other abnormalities of heart beat: Secondary | ICD-10-CM

## 2017-10-21 MED ORDER — CEFAZOLIN SODIUM-DEXTROSE 2-4 GM/100ML-% IV SOLN
2.0000 g | Freq: Three times a day (TID) | INTRAVENOUS | Status: DC
  Administered 2017-10-26 – 2017-11-05 (×32): 2 g via INTRAVENOUS
  Filled 2017-10-21 (×33): qty 100

## 2017-10-21 NOTE — Progress Notes (Signed)
Physical Therapy Treatment Patient Details Name: Gary Hicks MRN: [id number] DOB: [birth year] Today's Date: 10/21/2017    History of Present Illness Pt is a 32 y/o male with active IV drug use presenting with generalized pain. Family is concerned about seizures due to finding him in fetal position with abnormal eye movements. He reports global polyarthralgia, use of heroin and methamphetamines in the past 24 hours at time of admission. Diagnosed with staph bacteremia, severe sepsis. PMH bacterial endocarditis, heroin abuse, possible Hepatitis C, opiate abuse, polysubstance abuse, seizures.  Pt also with positive right LE DVT on 2/18.  On Lovenox.  Pt is s/p I & D of bilat hips on 3/5.     PT Comments    Pt making steady progress with functional mobility and agreeable to ambulation this session. Pt would continue to benefit from skilled physical therapy services at this time while admitted and after d/c to address the below listed limitations in order to improve overall safety and independence with functional mobility.    Follow Up Recommendations  Home health PT;Supervision/Assistance - 24 hour     Equipment Recommendations  Rolling walker with 5" wheels    Recommendations for Other Services       Precautions / Restrictions Precautions Precautions: Fall Restrictions Weight Bearing Restrictions: No    Mobility  Bed Mobility Overal bed mobility: Needs Assistance Bed Mobility: Supine to Sit;Sit to Supine     Supine to sit: Supervision Sit to supine: Supervision   General bed mobility comments: supervision for safety, increased time and effort required cue to pain. pt uses UEs to assist with moving L LE back onto bed  Transfers Overall transfer level: Needs assistance Equipment used: Rolling walker (2 wheeled) Transfers: Sit to/from Stand Sit to Stand: Supervision         General transfer comment: supervision for safety  Ambulation/Gait Ambulation/Gait assistance:  Supervision Ambulation Distance (Feet): 200 Feet Assistive device: Rolling walker (2 wheeled) Gait Pattern/deviations: Step-through pattern;Decreased step length - right;Decreased step length - left;Decreased stride length Gait velocity: decreased Gait velocity interpretation: Below normal speed for age/gender General Gait Details: very slow, cautious gait but steady with RW, no need for physical assistance and no LOB   Stairs            Wheelchair Mobility    Modified Rankin (Stroke Patients Only)       Balance Overall balance assessment: Needs assistance Sitting-balance support: Feet supported Sitting balance-Leahy Scale: Good     Standing balance support: Bilateral upper extremity supported Standing balance-Leahy Scale: Poor                              Cognition Arousal/Alertness: Awake/alert Behavior During Therapy: WFL for tasks assessed/performed Overall Cognitive Status: Within Functional Limits for tasks assessed                                        Exercises      General Comments        Pertinent Vitals/Pain Pain Assessment: Faces Faces Pain Scale: Hurts even more Pain Location: generalized Pain Descriptors / Indicators: Aching;Sore Pain Intervention(s): Monitored during session;Repositioned    Home Living                      Prior Function  PT Goals (current goals can now be found in the care plan section) Acute Rehab PT Goals PT Goal Formulation: With patient/family Time For Goal Achievement: 10/21/17 Potential to Achieve Goals: Fair Progress towards PT goals: Progressing toward goals    Frequency    Min 3X/week      PT Plan Discharge plan needs to be updated    Co-evaluation              AM-PAC PT "6 Clicks" Daily Activity  Outcome Measure  Difficulty turning over in bed (including adjusting bedclothes, sheets and blankets)?: None Difficulty moving from lying on back  to sitting on the side of the bed? : None Difficulty sitting down on and standing up from a chair with arms (e.g., wheelchair, bedside commode, etc,.)?: Unable Help needed moving to and from a bed to chair (including a wheelchair)?: None Help needed walking in hospital room?: None Help needed climbing 3-5 steps with a railing? : A Little 6 Click Score: 20    End of Session   Activity Tolerance: Patient limited by fatigue;Patient limited by pain Patient left: in bed;with call bell/phone within reach Nurse Communication: Mobility status PT Visit Diagnosis: Unsteadiness on feet (R26.81);Other abnormalities of gait and mobility (R26.89)     Time: 4128-7867 PT Time Calculation (min) (ACUTE ONLY): 14 min  Charges:  $Gait Training: 8-22 mins                    G Codes:       [name], [place], [place] 10/21/2017, 5:34 PM

## 2017-10-21 NOTE — Progress Notes (Signed)
[facility] for Infectious Disease  Date of Admission:  09/11/2017     Total days of antibiotics 41  Cefepime [place] 2   ASSESSMENT: Gary Hicks feels a little better today although still having some temperatures of about 100 (of which he is not symptomatic for). Has a new gallop on cardiac exam which I have not heard prior to today. No other symptoms that would suggest worsening heart failure at this point. Discussed with Dr. Algis Liming today.   PLAN: 1. Please try to have patient collect sputum sample if he is able to.  2. Will continue cefepime x 5 days through 3/26 then transition back to cefazolin 3/27 - 11/05/17.  3. Will hold off on rechecking TTE to evaluate for now - if he develops signs of heart failure or fevers continue may need to re-consider   Dr. Baxter Flattery will be covering for ID over the weekend and can be reached at [phone] for any questions.   Principal Problem:   Right lower lobe pneumonia ([place]) Active Problems:   Bacteremia due to Staphylococcus aureus   Staphylococcal arthritis of right shoulder (HCC)   Septic infrapatellar bursitis of left knee   Staphylococcal arthritis of left hip (HCC)   Staphylococcal arthritis of right hip (HCC)   Endocarditis of tricuspid valve   Saphenous vein thrombophlebitis, right   Heroin abuse (HCC)   Polysubstance abuse (HCC)   Severe sepsis (HCC)   Normocytic anemia   Cigarette smoker   Calf abscess   Anxiety   IVDU (intravenous drug user)   Fever   . apixaban  5 mg Oral BID  . bisacodyl  10 mg Rectal Once  . buprenorphine-naloxone  1 tablet Sublingual BID  . famotidine  20 mg Oral Daily  . feeding supplement (ENSURE ENLIVE)  237 mL Oral TID BM  . ferrous sulfate  325 mg Oral BID WC  . hydrocortisone cream   Topical BID  . multivitamin with minerals  1 tablet Oral Daily  . nicotine  14 mg Transdermal Daily  . polyethylene glycol  17 g Oral BID  . senna-docusate  1 tablet Oral BID  . sertraline  50 mg Oral  Daily  . sodium chloride flush  10-40 mL Intracatheter Q12H  . Vitamin D (Ergocalciferol)  50,000 Units Oral Q Mon    SUBJECTIVE: Temp 100.3 early this morning - has not rechecked yet today. Tells me he is feeling a little better today. Still with light cough and some posterior thoracic chest pain to his right side. Has not been out of bed much lately.   Allergies  Allergen Reactions  . Prednisone Hives  . Amoxicillin Hives    OBJECTIVE: Vitals:   10/20/17 1000 10/20/17 1725 10/20/17 2101 10/21/17 0509  BP: 99/65 100/63 103/65 93/67  Pulse: 98 (!) 108 (!) 110 (!) 112  Resp: 18 18 19 20   Temp: 98.3 F (36.8 C) 99.1 F (37.3 C) 98.6 F (37 C) 100.3 F (37.9 C)  TempSrc: Oral Oral Oral Oral  SpO2: 97% 98% 95% 92%  Weight:   132 lb 11.5 oz (60.2 kg)   Height:       Body mass index is 19.6 kg/m.  Physical Exam  Constitutional: He is oriented to person, place, and time and well-developed, well-nourished, and in no distress.  HENT:  Mouth/Throat: Oropharynx is clear and moist. No oral lesions. Normal dentition. No dental caries.  Eyes: Pupils are equal, round, and reactive to light. No  scleral icterus.  Neck: Normal range of motion.  Cardiovascular: Normal rate and regular rhythm. Exam reveals gallop.  No murmur heard. Pulmonary/Chest: Effort normal and breath sounds normal. No respiratory distress.  Generally diminished lung sounds.   Abdominal: Soft. He exhibits no distension. There is no tenderness.  Lymphadenopathy:    He has no cervical adenopathy.  Neurological: He is alert and oriented to person, place, and time.  Skin: Skin is warm and dry. No rash noted.  Psychiatric: Mood and affect normal.  Vitals reviewed.   Lab Results Lab Results  Component Value Date   WBC 12.5 (H) 10/20/2017   HGB 8.2 (L) 10/20/2017   HCT 25.6 (L) 10/20/2017   MCV 91.4 10/20/2017   PLT 288 10/20/2017    Lab Results  Component Value Date   CREATININE 0.58 (L) 10/20/2017   BUN 9  10/20/2017   NA 130 (L) 10/20/2017   K 4.1 10/20/2017   CL 93 (L) 10/20/2017   CO2 26 10/20/2017    Lab Results  Component Value Date   ALT 46 09/14/2017   AST 97 (H) 09/14/2017   ALKPHOS 91 09/14/2017   BILITOT 0.7 09/14/2017     Microbiology: BCx 3/20 >> no growth   Janene Madeira, MSN, NP-C [facility] for Infectious [place] Cell: [phone] Pager: [phone]  10/21/2017  9:20 AM

## 2017-10-21 NOTE — Progress Notes (Signed)
    I re-evaluated Gary Hicks today for management of Buprenorphine for severe opioid use disorder. He appears ill today on exam, looks fatigued, depressed, deconditioned, and complaining of persistent fevers and discomfort. He is having no symptoms of withdrawal and cravings are totally controlled with Bu[place] 16mg  daily, also receiving about one dose daily of oxycodone 5mg  for breakthrough pain. He has been declining a few medical interventions, such as thoracentesis of the left sided pleural effusion, but compliant with IV antibiotics through the left arm PICC. I strongly encouraged him to reconsider, talked to him about the high risk of parapneumonic effusions if left untreated, and he says he will reconsider if he is no better in 1-2 days.   If he has increased pain related to procedures or surgeries upcoming, I would recommend adding Dilaudid 2mg  IV in addition to Bupe 16mg  daily. The dilaudid dose may need to be increased from there because of his high tolerance and the receptor competition from the Bupe. It seems like he is declining, so feel free to call/text me if you have any questions about what to do with the Bupe dosing as things progress.   Lalla Brothers, MD Pager: [phone] Cell: [phone]

## 2017-10-21 NOTE — Progress Notes (Signed)
[facility]                                                                              Patient Demographics  Gary Hicks, is a 32 y.o. male, DOB - 02-12-1986, LFY:[id number]  Admit date - 09/11/2017   Admitting Physician Quintella Baton, MD  Outpatient Primary MD for the patient is Patient, [name] Per  Outpatient specialists:   LOS - 40  days   Medical records reviewed and are as summarized below:    Chief Complaint  Patient presents with  . Weakness  . Leg Pain       Brief summary   32 year old male, active IVDA, who presents with generalized pain. Patient was incarcerated and released 3-4 weeks ago. Patient admits using IV methamphetamine as well as heroin in the past 24 hours prior to admission. Reports global polyarthralgias including his bilateral feet, bilateral knees, bilateral hips and shoulders. Also reports fevers and chills generalized myalgias. Pt admitted for further management. 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 Pt was also diagnosed with DVT Right gastrocnemius vein . He was started on Lovenox 2/18.He was also diagnosed with left knee septic infection. He underwent bursectomy and I and D by Dr Percell Miller 2-25.He reported worsening left hip pain 3-02, MRI showed, B/L hipSeptic arthritis and 13 mm fluid collection in the posterior aspect of the left obturator internus muscle most concerning for a small abscess.IR was consulted for arthrocentesis, patient couldn't tolerate procedure. Dr Percell Miller performed I and D of bilateral hip on 3/5.   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  Blood cultures 2/11 grew MSSA, repeat on 2/15 and 3/7 have been negative -2D echo showed large 1.1 cmx 1.8 cm multilobulated highly mobile vegetation on the right ventricular aspect of  the anterior leaflet of the tricuspid valve.  Moderate to severe tricuspid regurgitation. -Infectious disease was consulted, recommended continue IV Ancef for 6 weeks from 2/23 till 11/05/17 (Ancef changed to cefepime on 3/21 due to new fevers) -History of IV drug use, currently on Suboxone, difficult placement hence patient will stay inpatient for the duration of antibiotics -CTA chest negative for PE, no chest pain currently -Noted intermittent low-grade fevers since 3/18.  No overt symptoms to suggest new source.  Discussed with Dr. Megan Salon, ID and his input appreciated >checking repeat blood cultures, chest x-ray and continue cefazolin. -Chest x-ray 3/20 showed persistent LLL atelectasis/pneumonia with moderate left pleural effusion and new infiltrate in right lower lobe.  However patient lacks pneumonia symptoms.   -Discussed with Dr. Megan Salon 3/21.  As per ID follow-up, patient describes some dyspnea, cough and decreased appetite that is new over the last 3-4 days, chest x-ray with new RLL infiltrate compared to prior and improved left pleural effusion.  They have broadened antibiotics to IV cefepime to cover for HCAP in addition to existing MSSA bacteremia. -Patient continues to decline thoracentesis. -Clinically improving.  Fever curve down. -Discussed with Dr. Megan Salon, ID on 3/22: Suspects embolization from his endocarditis  causing pleuritic right sided chest pain, dyspnea, cough and possible HCAP.  He recommends cefepime for at least 5 days before transitioning back to cefazolin.  Septic left knee, bilateral hip joints -MRI of the left knee showed 4.8 x0.8x 4 cm fluid collection in the subcutaneous fat anterior to patella tendon with severe surrounding soft tissue edema concerning for septic arthritis. -MRI hip showed septic arthritis and 13 mm fluid collection in the posterior aspect of the left obturator internus muscle concerning for small abscess -Orthopedics was consulted (Dr Percell Miller).   Status post I&D and bursectomy of the left knee prepatellar bursitis on 09/23/17, status post I&D of septic arthritis bilateral hip on 10/04/17 -Cultures intraoperatively negative -MRI brain negative for abscess, Doppler of the left arm negative for DVT. -No complaints today   Left-sided pleural effusion -Seen on CT scan, moderate, patient has decided not to pursue thoracentesis at all. -Will DC therapeutic lovenox and placed on NOAC for the right lower extremity DVT.  Apixaban will need to be held if patient agrees to thoracentesis.  He has refused thoracentesis in the past and again now.  DVT right gastrocnemius vein with superficial saphenous thrombophlebitis -Recommended therapeutic Lovenox, patient refused Lovenox at times -Patient has not decided not to proceed with thoracentesis, had been changed to NOAC  History of IV drug use -Dr. Damita Dunnings managing Suboxone twice daily -No high-dose IV benzos unless required for seizures, no extra narcotics  Malnutrition -Continue nutritional supplements, dietitian was consulted  Generalized anxiety disorder -Psych was consulted, continue Zoloft 50 mg daily -May continue Klonopin as needed for 2 weeks and then wean off, not advised for long-term. -Psych was reconsulted for follow-up recommended increasing the Zoloft and tapering off the Klonopin  Seizures -Likely due to drug use, continue seizure precautions, as needed Ativan  -MRI brain negative for any abscess  Iron deficiency anemia -H&H stable, continue iron supplementation  Thrombocytopenia -Resolved  Hyponatremia, hypokalemia -Resolved  Hepatitis C antibody positive -ID following  Vitamin D deficiency - Continue supplementation  Constipation -Resolved, continue Senokot S, MiraLAX  Code Status: full  DVT Prophylaxis: Eliquis  Family Communication: Discussed in detail with the patient, all imaging results, lab results explained to the patient    Disposition Plan: Will stay  inpatient for the duration of IV antibiotics, end date 11/05/17.     Time Spent in minutes 25 minutes  Procedures:   I&D of L knee on 09/23/17  I &D of bilateral hip on 10/04/17    Consultants:   Infectious disease Orthopedics  Antimicrobials:  IV Ancef till 11/05/17-3/21 Ancef now changed to cefepime on 3/21, await final workup and recommendations by ID.  Medications  Scheduled Meds: . apixaban  5 mg Oral BID  . bisacodyl  10 mg Rectal Once  . buprenorphine-naloxone  1 tablet Sublingual BID  . famotidine  20 mg Oral Daily  . feeding supplement (ENSURE ENLIVE)  237 mL Oral TID BM  . ferrous sulfate  325 mg Oral BID WC  . hydrocortisone cream   Topical BID  . multivitamin with minerals  1 tablet Oral Daily  . nicotine  14 mg Transdermal Daily  . polyethylene glycol  17 g Oral BID  . senna-docusate  1 tablet Oral BID  . sertraline  50 mg Oral Daily  . sodium chloride flush  10-40 mL Intracatheter Q12H  . Vitamin D (Ergocalciferol)  50,000 Units Oral Q Mon   Continuous Infusions: . [START ON 10/26/2017]  ceFAZolin (ANCEF) IV    .  ceFEPime (MAXIPIME) IV Stopped (10/21/17 0807)  . lactated ringers 10 mL/hr at 09/23/17 1329  . lactated ringers 10 mL/hr at 10/04/17 1237   PRN Meds:.acetaminophen, alum & mag hydroxide-simeth, clonazePAM, hydrOXYzine, ondansetron **OR** ondansetron (ZOFRAN) IV, oxyCODONE, sodium chloride, sodium chloride flush, sodium chloride flush, traZODone   Antibiotics   Anti-infectives (From admission, onward)   Start     Dose/Rate Route Frequency Ordered Stop   10/26/17 0600  ceFAZolin (ANCEF) IVPB 2g/100 mL premix     2 g 200 mL/hr over 30 Minutes Intravenous Every 8 hours 10/21/17 1123 11/06/17 0559   10/20/17 1600  ceFEPIme (MAXIPIME) 2 g in sodium chloride 0.9 % 100 mL IVPB     2 g 200 mL/hr over 30 Minutes Intravenous Every 8 hours 10/20/17 1517 10/25/17 2359   09/23/17 0600  ceFAZolin (ANCEF) IVPB 2g/100 mL premix     2 g 200 mL/hr over 30  Minutes Intravenous To Surgery 09/22/17 1956 09/23/17 1436   09/23/17 0600  ceFAZolin (ANCEF) IVPB 2g/100 mL premix  Status:  Discontinued     2 g 200 mL/hr over 30 Minutes Intravenous On call to O.R. 09/22/17 1956 09/22/17 2002   09/15/17 1500  Oritavancin Diphosphate (ORBACTIV) 1,200 mg in dextrose 5 % IVPB     1,200 mg 333.3 mL/hr over 180 Minutes Intravenous Once 09/15/17 1357 09/15/17 2100   09/12/17 1400  ceFAZolin (ANCEF) IVPB 2g/100 mL premix  Status:  Discontinued     2 g 200 mL/hr over 30 Minutes Intravenous Every 8 hours 09/12/17 1300 10/20/17 1517   09/11/17 2100  vancomycin (VANCOCIN) IVPB 750 mg/150 ml premix  Status:  Discontinued     750 mg 150 mL/hr over 60 Minutes Intravenous Every 8 hours 09/11/17 2015 09/12/17 1300        Subjective:   Patient reports feeling somewhat better today.  Improved dyspnea, right-sided chest pain.  Not much cough.  Objective:   Vitals:   10/20/17 1725 10/20/17 2101 10/21/17 0509 10/21/17 1044  BP: 100/63 103/65 93/67 109/65  Pulse: (!) 108 (!) 110 (!) 112 (!) 113  Resp: 18 19 20 20   Temp: 99.1 F (37.3 C) 98.6 F (37 C) 100.3 F (37.9 C) 99.4 F (37.4 C)  TempSrc: Oral Oral Oral Oral  SpO2: 98% 95% 92% 94%  Weight:  60.2 kg (132 lb 11.5 oz)    Height:        Intake/Output Summary (Last 24 hours) at 10/21/2017 1603 Last data filed at 10/21/2017 1500 Gross per 24 hour  Intake 1417 ml  Output 0 ml  Net 1417 ml     Wt Readings from Last 3 Encounters:  10/20/17 60.2 kg (132 lb 11.5 oz)  09/10/17 63.5 kg (140 lb)  01/05/15 63.5 kg (140 lb)     Exam: No change in exam compared to 3/20.   General: Alert and oriented x 3, NAD.  Does not appear septic or toxic.  Cardiovascular: S1 and S2 heard, regular and mildly tachycardic.  No JVD, murmurs or pedal edema.  Gallop +.  Respiratory: Reduced breath sounds in the bases, left greater than sign right but no crackles, wheezing or rhonchi.  Rest of lung fields clear to  auscultation.  Stable without change.  Gastrointestinal: Soft, nontender, nondistended, + bowel sounds  Ext: no pedal edema bilaterally.  Right upper extremity PICC line site without acute findings.  Neuro: no new deficits.  Alert and oriented x3.  Musculoskeletal: No digital cyanosis, clubbing  Skin:tatoos  Psych: Flat  affect., alert and oriented x3.  No focal neurological deficits.   Data Reviewed:  I have personally reviewed following labs and imaging studies  Micro Results Recent Results (from the past 240 hour(s))  Culture, blood (routine x 2)     Status: None (Preliminary result)   Collection Time: 10/19/17  2:06 PM  Result Value Ref Range Status   Specimen Description BLOOD BLOOD LEFT WRIST  Final   Special Requests   Final    BOTTLES DRAWN AEROBIC ONLY Blood Culture results may not be optimal due to an inadequate volume of blood received in culture bottles   Culture   Final    NO GROWTH 2 DAYS Performed at [facility] [id number] [place], [place] [address]    Report Status PENDING  Incomplete    Radiology Reports Dg Chest 2 View  Result Date: 10/19/2017 CLINICAL DATA:  New onset of fever. Recent episodes of chest pain. History of endocarditis, substance abuse, current smoker. EXAM: CHEST - 2 VIEW COMPARISON:  Chest x-ray and chest CT scan of October 13, 2017 FINDINGS: The lungs are adequately inflated. There is new airspace opacity in the right lower lung. There is persistent increased density in the left mid and lower lung with obscuration of the left hemidiaphragm. The heart is top-normal in size. The pulmonary vascularity is normal. The right PICC line tip projects over the distal third of the SVC. IMPRESSION: Persistent left lower lobe atelectasis or pneumonia with moderate-sized left pleural effusion. New infiltrate in the right lower lobe compatible with pneumonia. Electronically Signed   By: David  Martinique M.D.   On: 10/19/2017 13:32   Dg Chest 2  View  Result Date: 05-15-2018 CLINICAL DATA:  Chest pain. EXAM: CHEST - 2 VIEW COMPARISON:  Radiograph of October 06, 2017. FINDINGS: The heart size and mediastinal contours are within normal limits. No pneumothorax is noted. Right lung is clear. Right-sided PICC line is unchanged in position. Stable large left pleural effusion is noted with probable underlying atelectasis or infiltrate. The visualized skeletal structures are unremarkable. IMPRESSION: Stable large left pleural effusion is noted with probable underlying atelectasis or infiltrate. Electronically Signed   By: Marijo Conception, M.D.   On: 010-14-2019 15:53   Ct Angio Chest Pe W Or Wo Contrast  Result Date: 10/15/2017 CLINICAL DATA:  Evaluate for pulmonary embolus. Shortness of breath. EXAM: CT ANGIOGRAPHY CHEST WITH CONTRAST TECHNIQUE: Multidetector CT imaging of the chest was performed using the standard protocol during bolus administration of intravenous contrast. Multiplanar CT image reconstructions and MIPs were obtained to evaluate the vascular anatomy. CONTRAST:  13mL ISOVUE-370 IOPAMIDOL (ISOVUE-370) INJECTION 76% COMPARISON:  Chest CT 09/25/2017. FINDINGS: Cardiovascular: Normal heart size. No pericardial effusion. Normal caliber thoracic aorta. Central venous catheter tip terminates in the superior vena cava. Adequate opacification of the pulmonary arterial system. Motion artifact limits evaluation. No filling defect identified to suggest acute pulmonary embolus. Mediastinum/Nodes: No enlarged axillary, mediastinal or hilar lymphadenopathy. Esophagus is normal in appearance. Lungs/Pleura: Central airways are patent. Endobronchial material within the mid and peripheral right lower lobe bronchi which may represent aspiration. Focal consolidation surrounding the peripheral right lower lobe bronchi. Large layering left pleural effusion. There is consolidation involving the left lower lobe. 6 mm left upper lobe nodule (image 37; series 9), in the  location of a prior cyst. 7 mm cystic change right upper lobe peripherally (image 59; series 9) in the area of a prior pulmonary nodule. Previously described right lower lobe nodule  is significantly decreased in size measuring 8 mm (image 107; series 8), previously 13 mm. Upper Abdomen: Reflux of contrast into the hepatic veins. No acute process. Musculoskeletal: No aggressive or acute appearing osseous lesions. Review of the MIP images confirms the above findings. IMPRESSION: 1. No evidence of acute pulmonary embolus. 2. Interval decrease in size of many of the previously described pulmonary nodules, likely secondary to improving infectious/inflammatory process. 3. Endobronchial material within the peripheral right lower lobe bronchi with adjacent consolidation which may represent aspiration. 4. Interval increase in size of large left pleural effusion and left basilar atelectasis. 5. Reflux of contrast into the hepatic veins raising the possibility of right heart insufficiency. Electronically Signed   By: Lovey Newcomer M.D.   On: 10/15/2017 12:24   Ct Angio Chest Pe W Or Wo Contrast  Result Date: 09/25/2017 CLINICAL DATA:  32 year old male with LEFT LOWER extremity thrombosis. IV drug abuser. Generalized pain. Fever chills and myalgias. EXAM: CT ANGIOGRAPHY CHEST WITH CONTRAST TECHNIQUE: Multidetector CT imaging of the chest was performed using the standard protocol during bolus administration of intravenous contrast. Multiplanar CT image reconstructions and MIPs were obtained to evaluate the vascular anatomy. CONTRAST:  125mL ISOVUE-370 IOPAMIDOL (ISOVUE-370) INJECTION 76% COMPARISON:  06/06/2016 and prior chest radiographs FINDINGS: Cardiovascular: This is a technically satisfactory study. No pulmonary emboli are identified. Old healed UPPER limits normal heart size noted. No evidence of thoracic aortic aneurysm or pericardial effusion. Mediastinum/Nodes: No enlarged mediastinal, hilar, or axillary lymph nodes.  Thyroid gland, trachea, and esophagus demonstrate no significant findings. Lungs/Pleura: There are multiple irregular bilateral pulmonary nodules with index lesions as follows: An 8 x 13 mm SUPERIOR segment RIGHT LOWER lobe nodule (6:42) A 4 x 7 mm RIGHT UPPER lobe nodule (6:56) A 12 x 20 mm RIGHT LOWER lobe nodule (6:88) A 28 x 22 mm LEFT LOWER lobe nodule (6:91). These nodules are nonspecific but most likely represent infection or possibly septic emboli. Malignancy/metastatic disease is considered less likely. A small LEFT pleural effusion and LEFT basilar atelectasis noted. Upper Abdomen: No acute abnormality. Musculoskeletal: No acute abnormality or suspicious bony lesion. Review of the MIP images confirms the above findings. IMPRESSION: 1. Multiple bilateral pulmonary nodules as described. Favor infection or possibly septic emboli. Other inflammatory processes or malignancy/metastatic disease considered less likely. 2. Small LEFT pleural effusion and LEFT basilar atelectasis 3. No evidence of pulmonary emboli or thoracic aortic aneurysm. Electronically Signed   By: Margarette Canada M.D.   On: 09/25/2017 18:42   Mr Hip Right W Wo Contrast  Result Date: 10/02/2017 CLINICAL DATA:  Bilateral hip and back pain.  Left worse than right. EXAM: MRI OF THE LEFT HIP WITHOUT AND WITH CONTRAST TECHNIQUE: Multiplanar, multisequence MR imaging was performed both before and after administration of intravenous contrast. CONTRAST:  46mL MULTIHANCE GADOBENATE DIMEGLUMINE 529 MG/ML IV SOLN COMPARISON:  None. FINDINGS: Bones: No hip fracture, dislocation or avascular necrosis. Marrow edema in the left femoral head and acetabulum. Mild marrow edema in the anterior right acetabulum. Bilateral pericapsular edema and enhancement. No periosteal reaction or bone destruction. No aggressive osseous lesion. Normal sacrum and sacroiliac joints. No SI joint widening or erosive changes. Articular cartilage and labrum Articular cartilage:  Partial-thickness bilateral femoral heads and acetabulum, left greater than right. Small femoral inferior marginal osteophytes. Labrum: Grossly intact, but evaluation is limited by lack of intraarticular fluid. Joint or bursal effusion Joint effusion: Large left hip joint effusion with severe synovitis and enhancement. Small right hip joint effusion with  synovitis and enhancement. No SI joint effusion. Bursae:  No bursa formation. Muscles and tendons Muscle edema in the left obturator internus muscle with a 13 mm fluid collection posteriorly within the muscle concerning for a small abscess. Muscle edema and mild enhancement in the adductor musculature bilaterally and right iliacus muscle. Muscle edema and enhancement of bilateral multifidus muscles. Other findings Miscellaneous: No pelvic free fluid. No fluid collection or hematoma. No inguinal lymphadenopathy. No inguinal hernia. IMPRESSION: 1. Large left hip joint effusion with enhancing synovitis, pericapsular edema and severe marrow edema on either side of the joint most concerning for septic arthritis. 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 2. Small right hip joint effusion with enhancing synovitis and pericapsular edema and enhancement most concerning for septic arthritis. 3. Muscle edema within the adductor musculature bilaterally and multifidus muscles bilaterally most concerning for infectious myositis. 13 mm fluid collection in the posterior aspect of the left obturator internus muscle most concerning for a small abscess. Electronically Signed   By: Kathreen Devoid   On: 10/02/2017 11:36   Mr Hip Left W Wo Contrast  Result Date: 10/02/2017 CLINICAL DATA:  Bilateral hip and back pain.  Left worse than right. EXAM: MRI OF THE LEFT HIP WITHOUT AND WITH CONTRAST TECHNIQUE: Multiplanar, multisequence MR imaging was performed both before and after administration of intravenous contrast. CONTRAST:  15mL MULTIHANCE GADOBENATE DIMEGLUMINE  529 MG/ML IV SOLN COMPARISON:  None. FINDINGS: Bones: No hip fracture, dislocation or avascular necrosis. Marrow edema in the left femoral head and acetabulum. Mild marrow edema in the anterior right acetabulum. Bilateral pericapsular edema and enhancement. No periosteal reaction or bone destruction. No aggressive osseous lesion. Normal sacrum and sacroiliac joints. No SI joint widening or erosive changes. Articular cartilage and labrum Articular cartilage: Partial-thickness bilateral femoral heads and acetabulum, left greater than right. Small femoral inferior marginal osteophytes. Labrum: Grossly intact, but evaluation is limited by lack of intraarticular fluid. Joint or bursal effusion Joint effusion: Large left hip joint effusion with severe synovitis and enhancement. Small right hip joint effusion with synovitis and enhancement. No SI joint effusion. Bursae:  No bursa formation. Muscles and tendons Muscle edema in the left obturator internus muscle with a 13 mm fluid collection posteriorly within the muscle concerning for a small abscess. Muscle edema and mild enhancement in the adductor musculature bilaterally and right iliacus muscle. Muscle edema and enhancement of bilateral multifidus muscles. Other findings Miscellaneous: No pelvic free fluid. No fluid collection or hematoma. No inguinal lymphadenopathy. No inguinal hernia. IMPRESSION: 1. Large left hip joint effusion with enhancing synovitis, pericapsular edema and severe marrow edema on either side of the joint most concerning for septic arthritis. 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 2. Small right hip joint effusion with enhancing synovitis and pericapsular edema and enhancement most concerning for septic arthritis. 3. Muscle edema within the adductor musculature bilaterally and multifidus muscles bilaterally most concerning for infectious myositis. 13 mm fluid collection in the posterior aspect of the left obturator  internus muscle most concerning for a small abscess. Electronically Signed   By: Kathreen Devoid   On: 10/02/2017 11:36   Dg Chest Port 1 View  Result Date: 10/06/2017 CLINICAL DATA:  Fever and left upper chest pain for 1 week. EXAM: PORTABLE CHEST 1 VIEW COMPARISON:  Chest CTA 09/25/2017 FINDINGS: A right PICC terminates over the high right atrium. The cardiac silhouette is normal in size. Veiling opacity in the left mid and lower hemithorax is consistent  with a persistent, small to moderate-sized pleural effusion. Mild parenchymal opacity in the left lung base likely represents atelectasis. No right lung consolidation is seen. Nodular right lung opacities on the prior CT are not clearly apparent on this radiograph. No pneumothorax is identified. No acute osseous abnormality is seen. IMPRESSION: Left pleural effusion and mild left basilar atelectasis. Electronically Signed   By: Logan Bores M.D.   On: 10/06/2017 11:12   Dg Hips Bilat With Pelvis 2v  Result Date: 10/01/2017 CLINICAL DATA:  Hip pain. EXAM: DG HIP (WITH OR WITHOUT PELVIS) 2V BILAT COMPARISON:  None. FINDINGS: There are degenerative changes in the left hip with an osteophyte off the femoral neck. No significant loss of joint space. No fractures or dislocations identified. No other acute abnormalities. 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  No other abnormalities. Electronically Signed   By: Dorise Bullion III M.D   On: 10/01/2017 12:46   [place] Ekg Site Rite  Result Date: 09/25/2017 If Site Rite image not attached, placement could not be confirmed due to current cardiac rhythm.   Lab Data:  CBC: Recent Labs  Lab 10/16/17 1518 10/17/17 0438 10/18/17 1654 10/20/17 0426  WBC 7.7 9.2 12.4* 12.5*  HGB 8.6* 9.0* 8.7* 8.2*  HCT 26.4* 27.4* 27.7* 25.6*  MCV 93.6 93.2 92.3 91.4  PLT 259 269 278 485   Basic Metabolic Panel: Recent Labs  Lab 10/16/17 1518 10/17/17 0438 10/18/17 1654 10/20/17 0426  NA 130* 134* 133*  130*  K 3.6 4.3 4.1 4.1  CL 94* 95* 93* 93*  CO2 28 29 29 26   GLUCOSE 122* 108* 112* 119*  BUN 8 11 10 9   CREATININE 0.77 0.63 0.71 0.58*  CALCIUM 8.1* 8.4* 8.5* 8.1*   GFR: Estimated Creatinine Clearance: 113.9 mL/min (A) (by C-G formula based on SCr of 0.58 mg/dL (L)). Liver Function Tests: No results for input(s): AST, ALT, ALKPHOS, BILITOT, PROT, ALBUMIN in the last 168 hours. No results for input(s): LIPASE, AMYLASE in the last 168 hours. No results for input(s): AMMONIA in the last 168 hours. Coagulation Profile: No results for input(s): INR, PROTIME in the last 168 hours. Cardiac Enzymes: No results for input(s): CKTOTAL, CKMB, CKMBINDEX, TROPONINI in the last 168 hours. BNP (last 3 results) No results for input(s): PROBNP in the last 8760 hours. HbA1C: No results for input(s): HGBA1C in the last 72 hours. CBG: No results for input(s): GLUCAP in the last 168 hours. Lipid Profile: No results for input(s): CHOL, HDL, LDLCALC, TRIG, CHOLHDL, LDLDIRECT in the last 72 hours. Thyroid Function Tests: No results for input(s): TSH, T4TOTAL, FREET4, T3FREE, THYROIDAB in the last 72 hours. Anemia Panel: No results for input(s): VITAMINB12, FOLATE, FERRITIN, TIBC, IRON, RETICCTPCT in the last 72 hours. Urine analysis:    Component Value Date/Time   COLORURINE YELLOW 10/06/2017 [place] 10/06/2017 1746   LABSPEC 1.017 10/06/2017 1746   PHURINE 7.0 10/06/2017 1746   GLUCOSEU NEGATIVE 10/06/2017 1746   HGBUR MODERATE (A) 10/06/2017 1746   BILIRUBINUR NEGATIVE 10/06/2017 1746   KETONESUR NEGATIVE 10/06/2017 1746   PROTEINUR NEGATIVE 10/06/2017 1746   UROBILINOGEN 0.2 05/16/2012 0847   NITRITE NEGATIVE 10/06/2017 1746   LEUKOCYTESUR NEGATIVE 10/06/2017 [place], MD, FACP, FHM. [facility] Pager [phone]  If 7PM-7AM, please contact night-coverage [email or web address] Password [facility] 10/21/2017, 4:03 PM

## 2017-10-21 NOTE — Progress Notes (Signed)
Patient with new rash on right axilla from today. Steroid cream as ordered for buttocks was used. Effective therapy. MD aware.

## 2017-10-22 NOTE — Progress Notes (Signed)
[facility]                                                                              Gary Hicks Demographics  Gary Hicks, is a 32 y.o. male, DOB - [birth year], EVO:[id number]  Admit date - 09/11/2017   Admitting Physician Quintella Baton, MD  Outpatient Primary MD for the Gary Hicks is Gary Hicks, [name] Per  Outpatient specialists:   LOS - 41  days   Medical records reviewed and are as summarized below:    Chief Complaint  Gary Hicks presents with  . Weakness  . Leg Pain       Brief summary   32 year old male, active IVDA, who presents with generalized pain. Gary Hicks was incarcerated and released 3-4 weeks ago. Gary Hicks admits using IV methamphetamine as well as heroin in the past 24 hours prior to admission. Reports global polyarthralgias including his bilateral feet, bilateral knees, bilateral hips and shoulders. Also reports fevers and chills generalized myalgias. Pt admitted for further management. 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 Pt was also diagnosed with DVT Right gastrocnemius vein . He was started on Lovenox 2/18.He was also diagnosed with left knee septic infection. He underwent bursectomy and I and D by Dr Percell Miller 2-25.He reported worsening left hip pain 3-02, MRI showed, B/L hipSeptic arthritis and 13 mm fluid collection in the posterior aspect of the left obturator internus muscle most concerning for a small abscess.IR was consulted for arthrocentesis, Gary Hicks couldn't tolerate procedure. Dr Percell Miller performed I and D of bilateral hip on 3/5.  Awaiting completion of full course of IV antibiotics in hospital.  Developed new onset fever couple days ago, likely due to HCAP from septic emboli.  IV antibiotics broadened to cefepime.  ID following.   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  Blood cultures 2/11 grew MSSA, repeat on 2/15 and 3/7 have been negative -2D echo showed large 1.1 cmx 1.8 cm multilobulated highly mobile vegetation on the right ventricular aspect of the anterior leaflet of the tricuspid valve.  Moderate to severe tricuspid regurgitation. -Infectious disease was consulted, recommended continue IV Ancef for 6 weeks from 2/23 till 11/05/17 (Ancef changed to cefepime on 3/21 due to new fevers) -History of IV drug use, currently on Suboxone, difficult placement hence Gary Hicks will stay inpatient for the duration of antibiotics -CTA chest negative for PE, no chest pain currently -Noted intermittent low-grade fevers since 3/18.  ID was consulted again. Suspected septic embolization embolization from his endocarditis causing pleuritic right sided chest pain, dyspnea, cough and possible HCAP.  ID recommended cefepime for at least 5 days (Schechter 3 of 5) before transitioning back to cefazolin.  -Clinically improved, defervesced, improved symptomatically.  Septic left knee, bilateral hip joints -MRI of the left knee showed 4.8 x0.8x 4 cm fluid collection in the subcutaneous fat anterior to patella tendon with severe surrounding soft tissue edema concerning for septic arthritis. -MRI hip showed septic arthritis and 13 mm fluid collection in the posterior aspect of the  left obturator internus muscle concerning for small abscess -Orthopedics was consulted (Dr Percell Miller).  Status post I&D and bursectomy of the left knee prepatellar bursitis on 09/23/17, status post I&D of septic arthritis bilateral hip on 10/04/17 -Cultures intraoperatively negative -MRI brain negative for abscess, Doppler of the left arm negative for DVT. -No complaints today   Left-sided pleural effusion -Seen on CT scan, moderate, Gary Hicks has decided not to pursue thoracentesis at all. - Gary Hicks has repeatedly declined thoracentesis.  DVT right gastrocnemius vein with superficial saphenous  thrombophlebitis -Continue Eliquis.  History of IV drug use -Dr. Damita Dunnings managing Suboxone twice daily -No high-dose IV benzos unless required for seizures, no extra narcotics  Malnutrition -Continue nutritional supplements, dietitian was consulted  Generalized anxiety disorder -Psych was consulted, continue Zoloft 50 mg daily -May continue Klonopin as needed for 2 weeks and then wean off, not advised for long-term. -Psych was reconsulted for follow-up recommended increasing the Zoloft and tapering off the Klonopin  Seizures -Likely due to drug use, continue seizure precautions, as needed Ativan  -MRI brain negative for any abscess  Iron deficiency anemia -H&H stable, continue iron supplementation  Thrombocytopenia -Resolved  Hyponatremia, hypokalemia -Resolved  Hepatitis C antibody positive -ID following  Vitamin D deficiency - Continue supplementation  Constipation -Resolved, continue Senokot S, MiraLAX  Right axillary region rash:?  Allergic given response to topical hydrocortisone, continue and closely follow.  Code Status: full  DVT Prophylaxis: Eliquis Family Communication: None at bedside. Disposition Plan: Will stay inpatient for the duration of IV antibiotics, end date 11/05/17, unless change recommended by ID.     Time Spent in minutes 25 minutes  Procedures:   I&D of L knee on 09/23/17  I &D of bilateral hip on 10/04/17    Consultants:   Infectious disease Orthopedics  Antimicrobials:  IV Ancef till 11/05/17-3/21 Ancef now changed to cefepime on 3/21, await final workup and recommendations by ID.  Medications  Scheduled Meds: . apixaban  5 mg Oral BID  . bisacodyl  10 mg Rectal Once  . buprenorphine-naloxone  1 tablet Sublingual BID  . famotidine  20 mg Oral Daily  . feeding supplement (ENSURE ENLIVE)  237 mL Oral TID BM  . ferrous sulfate  325 mg Oral BID WC  . hydrocortisone cream   Topical BID  . multivitamin with minerals  1 tablet Oral  Daily  . nicotine  14 mg Transdermal Daily  . polyethylene glycol  17 g Oral BID  . senna-docusate  1 tablet Oral BID  . sertraline  50 mg Oral Daily  . sodium chloride flush  10-40 mL Intracatheter Q12H  . Vitamin D (Ergocalciferol)  50,000 Units Oral Q Mon   Continuous Infusions: . [START ON 10/26/2017]  ceFAZolin (ANCEF) IV    . ceFEPime (MAXIPIME) IV Stopped (10/22/17 1014)  . lactated ringers 10 mL/hr at 09/23/17 1329  . lactated ringers 10 mL/hr at 10/04/17 1237   PRN Meds:.acetaminophen, alum & mag hydroxide-simeth, clonazePAM, hydrOXYzine, ondansetron **OR** ondansetron (ZOFRAN) IV, oxyCODONE, sodium chloride, sodium chloride flush, sodium chloride flush, traZODone   Antibiotics   Anti-infectives (From admission, onward)   Start     Dose/Rate Route Frequency Ordered Stop   10/26/17 0600  ceFAZolin (ANCEF) IVPB 2g/100 mL premix     2 g 200 mL/hr over 30 Minutes Intravenous Every 8 hours 10/21/17 1123 11/06/17 0559   10/20/17 1600  ceFEPIme (MAXIPIME) 2 g in sodium chloride 0.9 % 100 mL IVPB     2 g 200  mL/hr over 30 Minutes Intravenous Every 8 hours 10/20/17 1517 10/25/17 2359   09/23/17 0600  ceFAZolin (ANCEF) IVPB 2g/100 mL premix     2 g 200 mL/hr over 30 Minutes Intravenous To Surgery 09/22/17 1956 09/23/17 1436   09/23/17 0600  ceFAZolin (ANCEF) IVPB 2g/100 mL premix  Status:  Discontinued     2 g 200 mL/hr over 30 Minutes Intravenous On call to O.R. 09/22/17 1956 09/22/17 2002   09/15/17 1500  Oritavancin Diphosphate (ORBACTIV) 1,200 mg in dextrose 5 % IVPB     1,200 mg 333.3 mL/hr over 180 Minutes Intravenous Once 09/15/17 1357 09/15/17 2100   09/12/17 1400  ceFAZolin (ANCEF) IVPB 2g/100 mL premix  Status:  Discontinued     2 g 200 mL/hr over 30 Minutes Intravenous Every 8 hours 09/12/17 1300 10/20/17 1517   09/11/17 2100  vancomycin (VANCOCIN) IVPB 750 mg/150 ml premix  Status:  Discontinued     750 mg 150 mL/hr over 60 Minutes Intravenous Every 8 hours 09/11/17  2015 09/12/17 1300        Subjective:   Overall feels much improved compared to 3 days ago.  No fevers.  Cough, dyspnea and right-sided pleuritic chest pain have significantly improved.  Reported rash underneath his right armpit, applied topical hydrocortisone yesterday and better today.  Not pruritic.  Objective:   Vitals:   10/21/17 1623 10/21/17 2225 10/22/17 0442 10/22/17 1041  BP: 101/65 103/73 95/61 101/64  Pulse: (!) 110 98 100 100  Resp: 20 18 18 16   Temp: 98.8 F (37.1 C) 98.7 F (37.1 C) 99 F (37.2 C) 98.1 F (36.7 C)  TempSrc: Oral Oral Oral Oral  SpO2: 96% 98% 95% 97%  Weight:      Height:        Intake/Output Summary (Last 24 hours) at 10/22/2017 1549 Last data filed at 10/22/2017 1430 Gross per 24 hour  Intake 660 ml  Output 0 ml  Net 660 ml     Wt Readings from Last 3 Encounters:  10/20/17 60.2 kg (132 lb 11.5 oz)  09/10/17 63.5 kg (140 lb)  01/05/15 63.5 kg (140 lb)     Exam:    General: Pleasant young male, moderately built and nourished lying comfortably supine in bed.  Cardiovascular: S1 and S2 heard, regular and mildly tachycardic.  No JVD, murmurs or pedal edema.  Gallop +.  Stable without change.  Respiratory: Diminished breath sounds in the bases left greater than sign right with occasional right basal crackles.  Rest of lung fields clear to auscultation.  No pleural rub.  No increased work of breathing.  Gastrointestinal: Soft, nontender, nondistended, + bowel sounds.  Stable without change.  Ext: no pedal edema bilaterally.  Right upper extremity PICC line site without acute findings.  Neuro: no new deficits.  Alert and oriented x3.  Musculoskeletal: No digital cyanosis, clubbing  Skin:tatoos.  Very faintly erythematous oval shaped rash right axilla with no other acute findings.  Psych: Flat affect., alert and oriented x3.  No focal neurological deficits.   Data Reviewed:  I have personally reviewed following labs and imaging  studies  Micro Results Recent Results (from the past 240 hour(s))  Culture, blood (routine x 2)     Status: None (Preliminary result)   Collection Time: 10/19/17  2:06 PM  Result Value Ref Range Status   Specimen Description BLOOD BLOOD LEFT WRIST  Final   Special Requests   Final    BOTTLES DRAWN AEROBIC ONLY Blood Culture  results may not be optimal due to an inadequate volume of blood received in culture bottles   Culture   Final    NO GROWTH 3 DAYS Performed at [facility], [place] [address] [place], [place] [address]    Report Status PENDING  Incomplete    Radiology Reports Dg Chest 2 View  Result Date: 10/19/2017 CLINICAL DATA:  New onset of fever. Recent episodes of chest pain. History of endocarditis, substance abuse, current smoker. EXAM: CHEST - 2 VIEW COMPARISON:  Chest x-ray and chest CT scan of October 13, 2017 FINDINGS: The lungs are adequately inflated. There is new airspace opacity in the right lower lung. There is persistent increased density in the left mid and lower lung with obscuration of the left hemidiaphragm. The heart is top-normal in size. The pulmonary vascularity is normal. The right PICC line tip projects over the distal third of the SVC. IMPRESSION: Persistent left lower lobe atelectasis or pneumonia with moderate-sized left pleural effusion. New infiltrate in the right lower lobe compatible with pneumonia. Electronically Signed   By: David  Martinique M.D.   On: 10/19/2017 13:32   Dg Chest 2 View  Result Date: 2020-01-2518 CLINICAL DATA:  Chest pain. EXAM: CHEST - 2 VIEW COMPARISON:  Radiograph of October 06, 2017. FINDINGS: The heart size and mediastinal contours are within normal limits. No pneumothorax is noted. Right lung is clear. Right-sided PICC line is unchanged in position. Stable large left pleural effusion is noted with probable underlying atelectasis or infiltrate. The visualized skeletal structures are unremarkable. IMPRESSION: Stable large left pleural  effusion is noted with probable underlying atelectasis or infiltrate. Electronically Signed   By: Marijo Conception, M.D.   On: 02020-01-2518 15:53   Ct Angio Chest Pe W Or Wo Contrast  Result Date: 10/15/2017 CLINICAL DATA:  Evaluate for pulmonary embolus. Shortness of breath. EXAM: CT ANGIOGRAPHY CHEST WITH CONTRAST TECHNIQUE: Multidetector CT imaging of the chest was performed using the standard protocol during bolus administration of intravenous contrast. Multiplanar CT image reconstructions and MIPs were obtained to evaluate the vascular anatomy. CONTRAST:  146mL ISOVUE-370 IOPAMIDOL (ISOVUE-370) INJECTION 76% COMPARISON:  Chest CT 09/25/2017. FINDINGS: Cardiovascular: Normal heart size. No pericardial effusion. Normal caliber thoracic aorta. Central venous catheter tip terminates in the superior vena cava. Adequate opacification of the pulmonary arterial system. Motion artifact limits evaluation. No filling defect identified to suggest acute pulmonary embolus. Mediastinum/Nodes: No enlarged axillary, mediastinal or hilar lymphadenopathy. Esophagus is normal in appearance. Lungs/Pleura: Central airways are patent. Endobronchial material within the mid and peripheral right lower lobe bronchi which may represent aspiration. Focal consolidation surrounding the peripheral right lower lobe bronchi. Large layering left pleural effusion. There is consolidation involving the left lower lobe. 6 mm left upper lobe nodule (image 37; series 9), in the location of a prior cyst. 7 mm cystic change right upper lobe peripherally (image 59; series 9) in the area of a prior pulmonary nodule. Previously described right lower lobe nodule is significantly decreased in size measuring 8 mm (image 107; series 8), previously 13 mm. Upper Abdomen: Reflux of contrast into the hepatic veins. No acute process. Musculoskeletal: No aggressive or acute appearing osseous lesions. Review of the MIP images confirms the above findings. IMPRESSION:  1. No evidence of acute pulmonary embolus. 2. Interval decrease in size of many of the previously described pulmonary nodules, likely secondary to improving infectious/inflammatory process. 3. Endobronchial material within the peripheral right lower lobe bronchi with adjacent consolidation which may represent aspiration.  4. Interval increase in size of large left pleural effusion and left basilar atelectasis. 5. Reflux of contrast into the hepatic veins raising the possibility of right heart insufficiency. Electronically Signed   By: Lovey Newcomer M.D.   On: 10/15/2017 12:24   Ct Angio Chest Pe W Or Wo Contrast  Result Date: 09/25/2017 CLINICAL DATA:  32 year old male with LEFT LOWER extremity thrombosis. IV drug abuser. Generalized pain. Fever chills and myalgias. EXAM: CT ANGIOGRAPHY CHEST WITH CONTRAST TECHNIQUE: Multidetector CT imaging of the chest was performed using the standard protocol during bolus administration of intravenous contrast. Multiplanar CT image reconstructions and MIPs were obtained to evaluate the vascular anatomy. CONTRAST:  130mL ISOVUE-370 IOPAMIDOL (ISOVUE-370) INJECTION 76% COMPARISON:  06/06/2016 and prior chest radiographs FINDINGS: Cardiovascular: This is a technically satisfactory study. No pulmonary emboli are identified. Old healed UPPER limits normal heart size noted. No evidence of thoracic aortic aneurysm or pericardial effusion. Mediastinum/Nodes: No enlarged mediastinal, hilar, or axillary lymph nodes. Thyroid gland, trachea, and esophagus demonstrate no significant findings. Lungs/Pleura: There are multiple irregular bilateral pulmonary nodules with index lesions as follows: An 8 x 13 mm SUPERIOR segment RIGHT LOWER lobe nodule (6:42) A 4 x 7 mm RIGHT UPPER lobe nodule (6:56) A 12 x 20 mm RIGHT LOWER lobe nodule (6:88) A 28 x 22 mm LEFT LOWER lobe nodule (6:91). These nodules are nonspecific but most likely represent infection or possibly septic emboli.  Malignancy/metastatic disease is considered less likely. A small LEFT pleural effusion and LEFT basilar atelectasis noted. Upper Abdomen: No acute abnormality. Musculoskeletal: No acute abnormality or suspicious bony lesion. Review of the MIP images confirms the above findings. IMPRESSION: 1. Multiple bilateral pulmonary nodules as described. Favor infection or possibly septic emboli. Other inflammatory processes or malignancy/metastatic disease considered less likely. 2. Small LEFT pleural effusion and LEFT basilar atelectasis 3. No evidence of pulmonary emboli or thoracic aortic aneurysm. Electronically Signed   By: Margarette Canada M.D.   On: 09/25/2017 18:42   Mr Hip Right W Wo Contrast  Result Date: 10/02/2017 CLINICAL DATA:  Bilateral hip and back pain.  Left worse than right. EXAM: MRI OF THE LEFT HIP WITHOUT AND WITH CONTRAST TECHNIQUE: Multiplanar, multisequence MR imaging was performed both before and after administration of intravenous contrast. CONTRAST:  18mL MULTIHANCE GADOBENATE DIMEGLUMINE 529 MG/ML IV SOLN COMPARISON:  None. FINDINGS: Bones: No hip fracture, dislocation or avascular necrosis. Marrow edema in the left femoral head and acetabulum. Mild marrow edema in the anterior right acetabulum. Bilateral pericapsular edema and enhancement. No periosteal reaction or bone destruction. No aggressive osseous lesion. Normal sacrum and sacroiliac joints. No SI joint widening or erosive changes. Articular cartilage and labrum Articular cartilage: Partial-thickness bilateral femoral heads and acetabulum, left greater than right. Small femoral inferior marginal osteophytes. Labrum: Grossly intact, but evaluation is limited by lack of intraarticular fluid. Joint or bursal effusion Joint effusion: Large left hip joint effusion with severe synovitis and enhancement. Small right hip joint effusion with synovitis and enhancement. No SI joint effusion. Bursae:  No bursa formation. Muscles and tendons Muscle edema  in the left obturator internus muscle with a 13 mm fluid collection posteriorly within the muscle concerning for a small abscess. Muscle edema and mild enhancement in the adductor musculature bilaterally and right iliacus muscle. Muscle edema and enhancement of bilateral multifidus muscles. Other findings Miscellaneous: No pelvic free fluid. No fluid collection or hematoma. No inguinal lymphadenopathy. No inguinal hernia. IMPRESSION: 1. Large left hip joint effusion with enhancing synovitis,  pericapsular edema and severe marrow edema on either side of the joint most concerning for septic arthritis. 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 2. Small right hip joint effusion with enhancing synovitis and pericapsular edema and enhancement most concerning for septic arthritis. 3. Muscle edema within the adductor musculature bilaterally and multifidus muscles bilaterally most concerning for infectious myositis. 13 mm fluid collection in the posterior aspect of the left obturator internus muscle most concerning for a small abscess. Electronically Signed   By: Kathreen Devoid   On: 10/02/2017 11:36   Mr Hip Left W Wo Contrast  Result Date: 10/02/2017 CLINICAL DATA:  Bilateral hip and back pain.  Left worse than right. EXAM: MRI OF THE LEFT HIP WITHOUT AND WITH CONTRAST TECHNIQUE: Multiplanar, multisequence MR imaging was performed both before and after administration of intravenous contrast. CONTRAST:  92mL MULTIHANCE GADOBENATE DIMEGLUMINE 529 MG/ML IV SOLN COMPARISON:  None. FINDINGS: Bones: No hip fracture, dislocation or avascular necrosis. Marrow edema in the left femoral head and acetabulum. Mild marrow edema in the anterior right acetabulum. Bilateral pericapsular edema and enhancement. No periosteal reaction or bone destruction. No aggressive osseous lesion. Normal sacrum and sacroiliac joints. No SI joint widening or erosive changes. Articular cartilage and labrum Articular cartilage:  Partial-thickness bilateral femoral heads and acetabulum, left greater than right. Small femoral inferior marginal osteophytes. Labrum: Grossly intact, but evaluation is limited by lack of intraarticular fluid. Joint or bursal effusion Joint effusion: Large left hip joint effusion with severe synovitis and enhancement. Small right hip joint effusion with synovitis and enhancement. No SI joint effusion. Bursae:  No bursa formation. Muscles and tendons Muscle edema in the left obturator internus muscle with a 13 mm fluid collection posteriorly within the muscle concerning for a small abscess. Muscle edema and mild enhancement in the adductor musculature bilaterally and right iliacus muscle. Muscle edema and enhancement of bilateral multifidus muscles. Other findings Miscellaneous: No pelvic free fluid. No fluid collection or hematoma. No inguinal lymphadenopathy. No inguinal hernia. IMPRESSION: 1. Large left hip joint effusion with enhancing synovitis, pericapsular edema and severe marrow edema on either side of the joint most concerning for septic arthritis. 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 2. Small right hip joint effusion with enhancing synovitis and pericapsular edema and enhancement most concerning for septic arthritis. 3. Muscle edema within the adductor musculature bilaterally and multifidus muscles bilaterally most concerning for infectious myositis. 13 mm fluid collection in the posterior aspect of the left obturator internus muscle most concerning for a small abscess. Electronically Signed   By: Kathreen Devoid   On: 10/02/2017 11:36   Dg Chest Port 1 View  Result Date: 10/06/2017 CLINICAL DATA:  Fever and left upper chest pain for 1 week. EXAM: PORTABLE CHEST 1 VIEW COMPARISON:  Chest CTA 09/25/2017 FINDINGS: A right PICC terminates over the high right atrium. The cardiac silhouette is normal in size. Veiling opacity in the left mid and lower hemithorax is consistent with a  persistent, small to moderate-sized pleural effusion. Mild parenchymal opacity in the left lung base likely represents atelectasis. No right lung consolidation is seen. Nodular right lung opacities on the prior CT are not clearly apparent on this radiograph. No pneumothorax is identified. No acute osseous abnormality is seen. IMPRESSION: Left pleural effusion and mild left basilar atelectasis. Electronically Signed   By: Logan Bores M.D.   On: 10/06/2017 11:12   Dg Hips Bilat With Pelvis 2v  Result Date: 10/01/2017 CLINICAL DATA:  Hip pain. EXAM: DG HIP (WITH OR WITHOUT PELVIS) 2V BILAT COMPARISON:  None. FINDINGS: There are degenerative changes in the left hip with an osteophyte off the femoral neck. No significant loss of joint space. No fractures or dislocations identified. No other acute abnormalities. 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  No other abnormalities. Electronically Signed   By: Dorise Bullion III M.D   On: 10/01/2017 12:46   [place] Ekg Site Rite  Result Date: 09/25/2017 If Site Rite image not attached, placement could not be confirmed due to current cardiac rhythm.   Lab Data:  CBC: Recent Labs  Lab 10/16/17 1518 10/17/17 0438 10/18/17 1654 10/20/17 0426  WBC 7.7 9.2 12.4* 12.5*  HGB 8.6* 9.0* 8.7* 8.2*  HCT 26.4* 27.4* 27.7* 25.6*  MCV 93.6 93.2 92.3 91.4  PLT 259 269 278 914   Basic Metabolic Panel: Recent Labs  Lab 10/16/17 1518 10/17/17 0438 10/18/17 1654 10/20/17 0426  NA 130* 134* 133* 130*  K 3.6 4.3 4.1 4.1  CL 94* 95* 93* 93*  CO2 28 29 29 26   GLUCOSE 122* 108* 112* 119*  BUN 8 11 10 9   CREATININE 0.77 0.63 0.71 0.58*  CALCIUM 8.1* 8.4* 8.5* 8.1*   GFR: Estimated Creatinine Clearance: 113.9 mL/min (A) (by C-G formula based on SCr of 0.58 mg/dL (L)). Liver Function Tests: No results for input(s): AST, ALT, ALKPHOS, BILITOT, PROT, ALBUMIN in the last 168 hours. No results for input(s): LIPASE, AMYLASE in the last 168 hours. No  results for input(s): AMMONIA in the last 168 hours. Coagulation Profile: No results for input(s): INR, PROTIME in the last 168 hours. Cardiac Enzymes: No results for input(s): CKTOTAL, CKMB, CKMBINDEX, TROPONINI in the last 168 hours. BNP (last 3 results) No results for input(s): PROBNP in the last 8760 hours. HbA1C: No results for input(s): HGBA1C in the last 72 hours. CBG: No results for input(s): GLUCAP in the last 168 hours. Lipid Profile: No results for input(s): CHOL, HDL, LDLCALC, TRIG, CHOLHDL, LDLDIRECT in the last 72 hours. Thyroid Function Tests: No results for input(s): TSH, T4TOTAL, FREET4, T3FREE, THYROIDAB in the last 72 hours. Anemia Panel: No results for input(s): VITAMINB12, FOLATE, FERRITIN, TIBC, IRON, RETICCTPCT in the last 72 hours. Urine analysis:    Component Value Date/Time   COLORURINE YELLOW 10/06/2017 [place] 10/06/2017 1746   LABSPEC 1.017 10/06/2017 1746   PHURINE 7.0 10/06/2017 1746   GLUCOSEU NEGATIVE 10/06/2017 1746   HGBUR MODERATE (A) 10/06/2017 1746   BILIRUBINUR NEGATIVE 10/06/2017 1746   KETONESUR NEGATIVE 10/06/2017 1746   PROTEINUR NEGATIVE 10/06/2017 1746   UROBILINOGEN 0.2 05/16/2012 0847   NITRITE NEGATIVE 10/06/2017 1746   LEUKOCYTESUR NEGATIVE 10/06/2017 Heath, MD, FACP, FHM. [facility] Pager [phone]  If 7PM-7AM, please contact night-coverage [email or web address] Password [facility] 10/22/2017, 3:49 PM

## 2017-10-23 LAB — BASIC METABOLIC PANEL
ANION GAP: 10 (ref 5–15)
BUN: 8 mg/dL (ref 6–20)
CALCIUM: 8.3 mg/dL — AB (ref 8.9–10.3)
CHLORIDE: 96 mmol/L — AB (ref 101–111)
CO2: 28 mmol/L (ref 22–32)
Creatinine, Ser: 0.56 mg/dL — ABNORMAL LOW (ref 0.61–1.24)
GFR calc non Af Amer: >60 mL/min (ref >60)
Glucose, Bld: 111 mg/dL — ABNORMAL HIGH (ref 65–99)
POTASSIUM: 4.2 mmol/L (ref 3.5–5.1)
Sodium: 134 mmol/L — ABNORMAL LOW (ref 135–145)

## 2017-10-23 LAB — CBC
HEMATOCRIT: 25.7 % — AB (ref 39.0–52.0)
Hemoglobin: 8 g/dL — ABNORMAL LOW (ref 13.0–17.0)
MCH: 28.3 pg (ref 26.0–34.0)
MCHC: 31.1 g/dL (ref 30.0–36.0)
MCV: 90.8 fL (ref 78.0–100.0)
Platelets: 289 10*3/uL (ref 150–400)
RBC: 2.83 MIL/uL — ABNORMAL LOW (ref 4.22–5.81)
RDW: 14 % (ref 11.5–15.5)
WBC: 8.6 10*3/uL (ref 4.0–10.5)

## 2017-10-23 NOTE — Progress Notes (Signed)
According to patients mother, Ryanne, DD, was supposed to arrange for patient to go downstairs to visit his daughter. This RN and Camera operator Gwenlyn Perking unaware of this information. MD notified. MD gave verbal order that patient could go off unit if accompanied by RN. Verbal orders placed. Will continue to monitor.

## 2017-10-23 NOTE — Progress Notes (Signed)
PROGRESS NOTE    Gary Hicks  IRW:[id number] DOB: 07-26-1986 DOA: 09/11/2017 PCP: Patient, [name] Per      Brief Narrative:  32 year old male, active IVDA, who presents with generalized pain. Patient was incarcerated and released 3-4 weeks ago. Patient admits using IV methamphetamine as well as heroin in the past 24 hours prior to admission. Reports global polyarthralgias including his bilateral feet, bilateral knees, bilateral hips and shoulders. Also reports fevers and chills generalized myalgias. Pt admitted for further management. 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 Pt was also diagnosed with DVT Right gastrocnemius vein . He was started on Lovenox 2/18.He was also diagnosed with left knee septic infection. He underwent bursectomy and I and D by Dr Percell Miller 2-25.He reported worsening left hip pain 3-02, MRI showed, B/L hipSeptic arthritis and 13 mm fluid collection in the posterior aspect of the left obturator internus muscle most concerning for a small abscess.IR was consulted for arthrocentesis, patient couldn't tolerate procedure. Dr Percell Miller performed I and D of bilateral hip on 3/5.     Assessment & Plan: Please see progress note from Dr. Algis Liming from 3/23 for interim summary.  Tricuspid endocarditis MSSA bacteremia -Continue cefepime for 2 more days, then cefazolin again   HCAP -Continue cefepime 2 more days  Distal DVT -Continue Eliquis  IVDU use disorder -Continue suboxone  Anxiety -Continue Zoloft  Anemia Iron deficiency, stable.    DVT prophylaxis: N/A, Eliquis Code Status: FULL Family Communication: None MDM and disposition Plan: The below labs and imaging reports were reviewed.  The patient's status is clinically stable.  He remains in the hospital to complete IV antibiotics for a life-threatening infection/endocarditis, as he has no viable alternatives to IV antibiotics in the hospital.     Consultants:    ID  Ortho   Subjective: Cough improving.  No new fever, no confusion.   No weakness, no chest pain, no new joint pains.  No events overnight, feels like he would like to be left alone.  Objective: Vitals:   10/22/17 1816 10/22/17 2223 10/23/17 0639 10/23/17 1015  BP: 109/73 (!) 99/58 96/62 102/72  Pulse: 99 98 (!) 102 100  Resp:      Temp: 98 F (36.7 C) 98.6 F (37 C) 98.6 F (37 C) 98.4 F (36.9 C)  TempSrc: Oral Oral Oral Oral  SpO2: 99% 97% 97% 99%  Weight:  61.8 kg (136 lb 3.9 oz)    Height:        Intake/Output Summary (Last 24 hours) at 10/23/2017 1418 Last data filed at 10/23/2017 0900 Gross per 24 hour  Intake 1080 ml  Output 970 ml  Net 110 ml   Filed Weights   10/19/17 2139 10/20/17 2101 10/22/17 2223  Weight: 60.3 kg (132 lb 15 oz) 60.2 kg (132 lb 11.5 oz) 61.8 kg (136 lb 3.9 oz)    Examination: General appearance: Thin adult male, sleeping, easily rousable and in no acute distress.   Cardiac: RRR, nl S1-S2, no murmurs appreciated.  Capillary refill is brisk.  No LE edema.    Respiratory: Normal respiratory rate and rhythm.  CTAB without rales or wheezes. Abdomen: Abdomen soft.  No TTP. No ascites, distension, hepatosplenomegaly.   MSK: No deformities or effusions.     Data Reviewed: I have personally reviewed following labs and imaging studies:  CBC: Recent Labs  Lab 10/16/17 1518 10/17/17 0438 10/18/17 1654 10/20/17 0426 10/23/17 0342  WBC 7.7 9.2 12.4* 12.5*  8.6  HGB 8.6* 9.0* 8.7* 8.2* 8.0*  HCT 26.4* 27.4* 27.7* 25.6* 25.7*  MCV 93.6 93.2 92.3 91.4 90.8  PLT 259 269 278 288 735   Basic Metabolic Panel: Recent Labs  Lab 10/16/17 1518 10/17/17 0438 10/18/17 1654 10/20/17 0426 10/23/17 0342  NA 130* 134* 133* 130* 134*  K 3.6 4.3 4.1 4.1 4.2  CL 94* 95* 93* 93* 96*  CO2 28 29 29 26 28   GLUCOSE 122* 108* 112* 119* 111*  BUN 8 11 10 9 8   CREATININE 0.77 0.63 0.71 0.58* 0.56*  CALCIUM 8.1* 8.4* 8.5* 8.1* 8.3*    GFR: Estimated Creatinine Clearance: 116.9 mL/min (A) (by C-G formula based on SCr of 0.56 mg/dL (L)). Liver Function Tests: No results for input(s): AST, ALT, ALKPHOS, BILITOT, PROT, ALBUMIN in the last 168 hours. No results for input(s): LIPASE, AMYLASE in the last 168 hours. No results for input(s): AMMONIA in the last 168 hours. Coagulation Profile: No results for input(s): INR, PROTIME in the last 168 hours. Cardiac Enzymes: No results for input(s): CKTOTAL, CKMB, CKMBINDEX, TROPONINI in the last 168 hours. BNP (last 3 results) No results for input(s): PROBNP in the last 8760 hours. HbA1C: No results for input(s): HGBA1C in the last 72 hours. CBG: No results for input(s): GLUCAP in the last 168 hours. Lipid Profile: No results for input(s): CHOL, HDL, LDLCALC, TRIG, CHOLHDL, LDLDIRECT in the last 72 hours. Thyroid Function Tests: No results for input(s): TSH, T4TOTAL, FREET4, T3FREE, THYROIDAB in the last 72 hours. Anemia Panel: No results for input(s): VITAMINB12, FOLATE, FERRITIN, TIBC, IRON, RETICCTPCT in the last 72 hours. Urine analysis:    Component Value Date/Time   COLORURINE YELLOW 10/06/2017 1746   APPEARANCEUR CLEAR 10/06/2017 1746   LABSPEC 1.017 10/06/2017 1746   PHURINE 7.0 10/06/2017 1746   GLUCOSEU NEGATIVE 10/06/2017 1746   HGBUR MODERATE (A) 10/06/2017 1746   BILIRUBINUR NEGATIVE 10/06/2017 1746   KETONESUR NEGATIVE 10/06/2017 1746   PROTEINUR NEGATIVE 10/06/2017 1746   UROBILINOGEN 0.2 05/16/2012 0847   NITRITE NEGATIVE 10/06/2017 1746   LEUKOCYTESUR NEGATIVE 10/06/2017 1746   Sepsis Labs: @LABRCNTIP (procalcitonin:4,lacticacidven:4)  ) Recent Results (from the past 240 hour(s))  Culture, blood (routine x 2)     Status: None (Preliminary result)   Collection Time: 10/19/17  2:06 PM  Result Value Ref Range Status   Specimen Description BLOOD BLOOD LEFT WRIST  Final   Special Requests   Final    BOTTLES DRAWN AEROBIC ONLY Blood Culture results  may not be optimal due to an inadequate volume of blood received in culture bottles   Culture   Final    NO GROWTH 4 DAYS Performed at [facility], [place] [address] [place], [place] [address]    Report Status PENDING  Incomplete         Radiology Studies: No results found.      Scheduled Meds: . apixaban  5 mg Oral BID  . bisacodyl  10 mg Rectal Once  . buprenorphine-naloxone  1 tablet Sublingual BID  . famotidine  20 mg Oral Daily  . feeding supplement (ENSURE ENLIVE)  237 mL Oral TID BM  . ferrous sulfate  325 mg Oral BID WC  . hydrocortisone cream   Topical BID  . multivitamin with minerals  1 tablet Oral Daily  . nicotine  14 mg Transdermal Daily  . polyethylene glycol  17 g Oral BID  . senna-docusate  1 tablet Oral BID  . sertraline  50 mg  Oral Daily  . sodium chloride flush  10-40 mL Intracatheter Q12H  . Vitamin D (Ergocalciferol)  50,000 Units Oral Q Mon   Continuous Infusions: . [START ON 10/26/2017]  ceFAZolin (ANCEF) IV    . ceFEPime (MAXIPIME) IV Stopped (10/23/17 1129)  . lactated ringers 10 mL/hr at 09/23/17 1329  . lactated ringers 10 mL/hr at 10/04/17 1237     LOS: 42 days    Time spent: 15 minutes    Edwin Dada, MD [facility] 10/23/2017, 2:18 PM     Pager [phone] --- please page though AMION:  [email or web address] Password [email or web address] If 7PM-7AM, please contact night-coverage

## 2017-10-24 LAB — CULTURE, BLOOD (ROUTINE X 2): CULTURE: NO GROWTH

## 2017-10-24 NOTE — Progress Notes (Signed)
[facility] for Infectious Disease  Date of Admission:  09/11/2017     Total days of antibiotics 41  Cefepime [place] 2   ASSESSMENT: [place] feels much improved compared to last week. Cough is improving and shortness of breath resolving Bene by [place]. True HCAP vs embolization of vegetation off tricuspid valve.   PLAN: 1. Continue Cefepime for one more [place] 3/26 and back to ancef to finish out his treatment for disseminated MSSA infections/endocarditis through 11/05/17.  2. Will need outpatient follow up in ID clinic 7-10 days after he completes treatment and is discharged.  3. Will also need follow up with IM clinic for suboxone management at discharge.   We will sign off for now but available as needed should his condition change.    Principal Problem:   Right lower lobe pneumonia ([place]) Active Problems:   Bacteremia due to Staphylococcus aureus   Staphylococcal arthritis of right shoulder (HCC)   Septic infrapatellar bursitis of left knee   Staphylococcal arthritis of left hip (HCC)   Staphylococcal arthritis of right hip (HCC)   Endocarditis of tricuspid valve   Saphenous vein thrombophlebitis, right   Heroin abuse (HCC)   Polysubstance abuse (HCC)   Severe sepsis (HCC)   Normocytic anemia   Cigarette smoker   Calf abscess   Anxiety   IVDU (intravenous drug user)   Fever   . apixaban  5 mg Oral BID  . bisacodyl  10 mg Rectal Once  . buprenorphine-naloxone  1 tablet Sublingual BID  . famotidine  20 mg Oral Daily  . feeding supplement (ENSURE ENLIVE)  237 mL Oral TID BM  . ferrous sulfate  325 mg Oral BID WC  . hydrocortisone cream   Topical BID  . multivitamin with minerals  1 tablet Oral Daily  . nicotine  14 mg Transdermal Daily  . polyethylene glycol  17 g Oral BID  . senna-docusate  1 tablet Oral BID  . sertraline  50 mg Oral Daily  . sodium chloride flush  10-40 mL Intracatheter Q12H  . Vitamin D (Ergocalciferol)  50,000 Units Oral Q Mon     SUBJECTIVE: Afebrile over the weekend. He is feeling better and had a hair cut. Chest pain and cough are improved significantly.   Allergies  Allergen Reactions  . Prednisone Hives  . Amoxicillin Hives    OBJECTIVE: Vitals:   10/23/17 0639 10/23/17 1015 10/23/17 2254 10/24/17 0615  BP: 96/62 102/72 (!) 100/58 91/63  Pulse: (!) 102 100 100 99  Resp:      Temp: 98.6 F (37 C) 98.4 F (36.9 C) 98.7 F (37.1 C) 99 F (37.2 C)  TempSrc: Oral Oral Oral Oral  SpO2: 97% 99% 95% 95%  Weight:   135 lb 2.3 oz (61.3 kg)   Height:       Body mass index is 19.96 kg/m.  Physical Exam  Constitutional: He is oriented to person, place, and time and well-developed, well-nourished, and in no distress.  HENT:  Mouth/Throat: Oropharynx is clear and moist. No oral lesions. Normal dentition. No dental caries.  Eyes: Pupils are equal, round, and reactive to light. No scleral icterus.  Neck: Normal range of motion.  Cardiovascular: Normal rate and regular rhythm. Exam reveals gallop.  No murmur heard. Pulmonary/Chest: Effort normal and breath sounds normal. No respiratory distress.  Abdominal: Soft. He exhibits no distension. There is no tenderness.  Lymphadenopathy:    He has no cervical adenopathy.  Neurological: He is alert and oriented to person, place, and time.  Skin: Skin is warm and dry. No rash noted.  Psychiatric: Mood and affect normal.  Vitals reviewed.   Lab Results Lab Results  Component Value Date   WBC 8.6 10/23/2017   HGB 8.0 (L) 10/23/2017   HCT 25.7 (L) 10/23/2017   MCV 90.8 10/23/2017   PLT 289 10/23/2017    Lab Results  Component Value Date   CREATININE 0.56 (L) 10/23/2017   BUN 8 10/23/2017   NA 134 (L) 10/23/2017   K 4.2 10/23/2017   CL 96 (L) 10/23/2017   CO2 28 10/23/2017    Lab Results  Component Value Date   ALT 46 09/14/2017   AST 97 (H) 09/14/2017   ALKPHOS 91 09/14/2017   BILITOT 0.7 09/14/2017     Microbiology: BCx 3/20 >> no growth    Janene Madeira, MSN, NP-C [facility]ious [place] Cell: [phone] Pager: [phone]  10/24/2017  10:33 AM

## 2017-10-24 NOTE — Progress Notes (Signed)
Nutrition Follow-up  DOCUMENTATION CODES:   Not applicable  INTERVENTION:   -Ensure Enlive po TID, each supplement provides 350 kcal and 20 grams of protein  -Continue MVI daily  -Family to continue to bring in outside food for patient  NUTRITION DIAGNOSIS:   Increased nutrient needs related to acute illness as evidenced by estimated needs.  Ongoing   GOAL:   Patient will meet greater than or equal to 90% of their needs  Progressing  MONITOR:   PO intake, Supplement acceptance, Labs, Weight trends  REASON FOR ASSESSMENT:   Malnutrition Screening Tool    ASSESSMENT:   32 yo male admitted severe sepsis with bacterial endocarditis, MSSA bacteremia. Pt with active IV drug abuse (meth and heroin)  Pt remains in hospital for IV antibiotic therapy (end date of IV antibiotics is 11/05/17); overall improving Family continues to bring in outside food for patient as pt does not like hospital meals; recorded po intake of hospital meals is 0% but pt indicates that he is eating.  Pt taking some Ensure Enlive daily  Weight relatively stable since the beginning of the month  Labs: sodium 134 Meds: MVI, ferrous sulfate, Vitamin D   Diet Order:  Seizure precautions Diet regular Room service appropriate? Yes; Fluid consistency: Thin  EDUCATION NEEDS:   Education needs have been addressed  Skin:  Skin Assessment: (incision knee and hip)  Last BM:  10/16/17  Height:   Ht Readings from Last 1 Encounters:  09/23/17 5\' 9"  (1.753 m)    Weight:   Wt Readings from Last 1 Encounters:  10/23/17 135 lb 2.3 oz (61.3 kg)    Ideal Body Weight:  72.7 kg  BMI:  Body mass index is 19.96 kg/m.  Estimated Nutritional Needs:   Kcal:  2200-2400 kcals  Protein:  110-120 g  Fluid:  >/= 2 L  Kerman Passey MS, RD, LDN, CNSC [phone] Pager  [phone] Weekend/On-Call Pager

## 2017-10-24 NOTE — Progress Notes (Signed)
PROGRESS NOTE    Lyriq Jarchow Christon  HCW:[id number] DOB: 1986-01-22 DOA: 09/11/2017 PCP: Patient, [name] Per      Brief Narrative:  32 year old male, active IVDA, who presents with generalized pain. Patient was incarcerated and released 3-4 weeks ago. Patient admits using IV methamphetamine as well as heroin in the past 24 hours prior to admission. Reports global polyarthralgias including his bilateral feet, bilateral knees, bilateral hips and shoulders. Also reports fevers and chills generalized myalgias. Pt admitted for further management. 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 Pt was also diagnosed with DVT Right gastrocnemius vein . He was started on Lovenox 2/18.He was also diagnosed with left knee septic infection. He underwent bursectomy and I and D by Dr Percell Miller 2-25.He reported worsening left hip pain 3-02, MRI showed, B/L hipSeptic arthritis and 13 mm fluid collection in the posterior aspect of the left obturator internus muscle most concerning for a small abscess.IR was consulted for arthrocentesis, patient couldn't tolerate procedure. Dr Percell Miller performed I and D of bilateral hip on 3/5.     Assessment & Plan: Please see progress note from Dr. Algis Liming from 3/23 for interim summary.  Tricuspid endocarditis MSSA bacteremia -Continue cefepime for 1 more days, then cefazolin again, orders already placed  HCAP -Continue cefepime 1 more days  Distal DVT -Continue Eliquis, plan for 3 months  IVDU use disorder -Continue suboxone  Anxiety -Continue Zoloft  Anemia Iron deficiency, stable.  Ulnar neuropathy -OT eval    DVT prophylaxis: N/A, Eliquis Code Status: FULL Family Communication: None MDM and disposition Plan: The below labs and imaging reports were reviewed.  The patient's status is clinically stable.  He remains in the hospital to complete IV antibiotics for a life-threatening infection/endocarditis, as he has no  viable alternatives to IV antibiotics in the hospital.     Consultants:   ID  Ortho   Subjective: No new fever, respiratory difficulty.  No chest pain.  He feels discomfort in his bilateral hands, 4th and 5th digits, numbness and aching, worse with bending elbows, relieved with extension, but now severe and constant.  No skin changes, no joint swelling, no redness or erythema.     Objective: Vitals:   10/23/17 1015 10/23/17 2254 10/24/17 0615 10/24/17 1012  BP: 102/72 (!) 100/58 91/63 99/66   Pulse: 100 100 99 97  Resp:      Temp: 98.4 F (36.9 C) 98.7 F (37.1 C) 99 F (37.2 C) 98 F (36.7 C)  TempSrc: Oral Oral Oral Oral  SpO2: 99% 95% 95% 93%  Weight:  61.3 kg (135 lb 2.3 oz)    Height:        Intake/Output Summary (Last 24 hours) at 10/24/2017 1526 Last data filed at 10/24/2017 1430 Gross per 24 hour  Intake 360 ml  Output 650 ml  Net -290 ml   Filed Weights   10/20/17 2101 10/22/17 2223 10/23/17 2254  Weight: 60.2 kg (132 lb 11.5 oz) 61.8 kg (136 lb 3.9 oz) 61.3 kg (135 lb 2.3 oz)    Examination: General appearance: Thin adult male, sleeping, easily rousable and in no acute distress.   Cardiac: RRR, nl S1-S2, no murmurs appreciated.  Capillary refill is brisk.  No LE edema.    Respiratory: Normal respiratory rate and rhythm.  CTAB without rales or wheezes. Abdomen: Abdomen soft.  No TTP. No ascites, distension, hepatosplenomegaly.   MSK: No deformities or effusions.  The hands bilaterally look normal without redness, swelling,  effusions.     Data Reviewed: I have personally reviewed following labs and imaging studies:  CBC: Recent Labs  Lab 10/18/17 1654 10/20/17 0426 10/23/17 0342  WBC 12.4* 12.5* 8.6  HGB 8.7* 8.2* 8.0*  HCT 27.7* 25.6* 25.7*  MCV 92.3 91.4 90.8  PLT 278 288 829   Basic Metabolic Panel: Recent Labs  Lab 10/18/17 1654 10/20/17 0426 10/23/17 0342  NA 133* 130* 134*  K 4.1 4.1 4.2  CL 93* 93* 96*  CO2 29 26 28   GLUCOSE  112* 119* 111*  BUN 10 9 8   CREATININE 0.71 0.58* 0.56*  CALCIUM 8.5* 8.1* 8.3*   GFR: Estimated Creatinine Clearance: 116 mL/min (A) (by C-G formula based on SCr of 0.56 mg/dL (L)). Liver Function Tests: No results for input(s): AST, ALT, ALKPHOS, BILITOT, PROT, ALBUMIN in the last 168 hours. No results for input(s): LIPASE, AMYLASE in the last 168 hours. No results for input(s): AMMONIA in the last 168 hours. Coagulation Profile: No results for input(s): INR, PROTIME in the last 168 hours. Cardiac Enzymes: No results for input(s): CKTOTAL, CKMB, CKMBINDEX, TROPONINI in the last 168 hours. BNP (last 3 results) No results for input(s): PROBNP in the last 8760 hours. HbA1C: No results for input(s): HGBA1C in the last 72 hours. CBG: No results for input(s): GLUCAP in the last 168 hours. Lipid Profile: No results for input(s): CHOL, HDL, LDLCALC, TRIG, CHOLHDL, LDLDIRECT in the last 72 hours. Thyroid Function Tests: No results for input(s): TSH, T4TOTAL, FREET4, T3FREE, THYROIDAB in the last 72 hours. Anemia Panel: No results for input(s): VITAMINB12, FOLATE, FERRITIN, TIBC, IRON, RETICCTPCT in the last 72 hours. Urine analysis:    Component Value Date/Time   COLORURINE YELLOW 10/06/2017 1746   APPEARANCEUR CLEAR 10/06/2017 1746   LABSPEC 1.017 10/06/2017 1746   PHURINE 7.0 10/06/2017 1746   GLUCOSEU NEGATIVE 10/06/2017 1746   HGBUR MODERATE (A) 10/06/2017 1746   BILIRUBINUR NEGATIVE 10/06/2017 1746   KETONESUR NEGATIVE 10/06/2017 1746   PROTEINUR NEGATIVE 10/06/2017 1746   UROBILINOGEN 0.2 05/16/2012 0847   NITRITE NEGATIVE 10/06/2017 1746   LEUKOCYTESUR NEGATIVE 10/06/2017 1746   Sepsis Labs: @LABRCNTIP (procalcitonin:4,lacticacidven:4)  ) Recent Results (from the past 240 hour(s))  Culture, blood (routine x 2)     Status: None   Collection Time: 10/19/17  2:06 PM  Result Value Ref Range Status   Specimen Description BLOOD BLOOD LEFT WRIST  Final   Special Requests    Final    BOTTLES DRAWN AEROBIC ONLY Blood Culture results may not be optimal due to an inadequate volume of blood received in culture bottles   Culture   Final    NO GROWTH 5 DAYS Performed at [facility], [place] [address] [place], [place] [address]    Report Status 10/24/2017 FINAL  Final         Radiology Studies: No results found.      Scheduled Meds: . apixaban  5 mg Oral BID  . buprenorphine-naloxone  1 tablet Sublingual BID  . famotidine  20 mg Oral Daily  . feeding supplement (ENSURE ENLIVE)  237 mL Oral TID BM  . ferrous sulfate  325 mg Oral BID WC  . hydrocortisone cream   Topical BID  . multivitamin with minerals  1 tablet Oral Daily  . nicotine  14 mg Transdermal Daily  . polyethylene glycol  17 g Oral BID  . senna-docusate  1 tablet Oral BID  . sertraline  50 mg Oral Daily  . sodium  chloride flush  10-40 mL Intracatheter Q12H  . Vitamin D (Ergocalciferol)  50,000 Units Oral Q Mon   Continuous Infusions: . [START ON 10/26/2017]  ceFAZolin (ANCEF) IV    . ceFEPime (MAXIPIME) IV Stopped (10/24/17 0841)  . lactated ringers 10 mL/hr at 09/23/17 1329  . lactated ringers 10 mL/hr at 10/04/17 1237     LOS: 43 days    Time spent: 15 minutes    Edwin Dada, MD [facility] 10/24/2017, 3:26 PM     Pager [phone] --- please page though AMION:  [email or web address] Password [email or web address] If 7PM-7AM, please contact night-coverage

## 2017-10-24 NOTE — Progress Notes (Signed)
Physical Therapy Treatment Patient Details Name: Gary Hicks MRN: [id number] DOB: [birth year] Today's Date: 10/24/2017    History of Present Illness Pt is a 32 y/o male with active IV drug use presenting with generalized pain. Family is concerned about seizures due to finding him in fetal position with abnormal eye movements. He reports global polyarthralgia, use of heroin and methamphetamines in the past 24 hours at time of admission. Diagnosed with staph bacteremia, severe sepsis. PMH bacterial endocarditis, heroin abuse, possible Hepatitis C, opiate abuse, polysubstance abuse, seizures.  Pt also with positive right LE DVT on 2/18.  On Lovenox.  Pt is s/p I & D of bilat hips on 3/5.     PT Comments    Pt progressing well towards functional mobility goals. Pt supervision for bed mobility, transfers, and ambulation with RW at this time. Pt able to ambulate ~10 ft with R hall rail though noted increase in antalgic gait compared to walking with RW, no instability or LOB notes. Discussed attempting less restrictive assistive device with pt next session and pt agreed. Goals updated to reflect current pt status. Will continue to follow acutely and progress as tolerated.      Follow Up Recommendations  Home health PT;Supervision/Assistance - 24 hour     Equipment Recommendations  Rolling walker with 5" wheels    Recommendations for Other Services       Precautions / Restrictions Precautions Precautions: Fall Restrictions Weight Bearing Restrictions: No RLE Weight Bearing: Weight bearing as tolerated LLE Weight Bearing: Weight bearing as tolerated    Mobility  Bed Mobility Overal bed mobility: Needs Assistance Bed Mobility: Supine to Sit;Sit to Supine     Supine to sit: Supervision Sit to supine: Supervision   General bed mobility comments: supervision for safety, increased time with pt assisting LLE with UEs  Transfers Overall transfer level: Needs assistance Equipment used:  Rolling walker (2 wheeled) Transfers: Sit to/from Stand Sit to Stand: Supervision         General transfer comment: supervision from EOB for safety and stability, proper hand placement on RW without cues  Ambulation/Gait Ambulation/Gait assistance: Supervision Ambulation Distance (Feet): 200 Feet Assistive device: Rolling walker (2 wheeled)(hall rail) Gait Pattern/deviations: Step-through pattern;Antalgic Gait velocity: decreased Gait velocity interpretation: Below normal speed for age/gender General Gait Details: no need for physical assist, no LOB, pt with slow cadence but improved from previous sessions. pt with RW for majority of ambulation but able to walk for ~10 ft with R hall rail though noted increase in antalgic gait pattern compared to RW. pt would like to attempt walking with a cane next session   Stairs            Wheelchair Mobility    Modified Rankin (Stroke Patients Only)       Balance Overall balance assessment: Needs assistance Sitting-balance support: Feet supported Sitting balance-Leahy Scale: Good     Standing balance support: Bilateral upper extremity supported Standing balance-Leahy Scale: Poor Standing balance comment: relies on UEs for balance                            Cognition Arousal/Alertness: Awake/alert Behavior During Therapy: WFL for tasks assessed/performed Overall Cognitive Status: Within Functional Limits for tasks assessed  Exercises General Exercises - Lower Extremity Hip Flexion/Marching: AROM;Both;10 reps;Standing    General Comments General comments (skin integrity, edema, etc.): VSS      Pertinent Vitals/Pain Pain Assessment: Faces Faces Pain Scale: Hurts little more Pain Location: lateral L knee Pain Descriptors / Indicators: Aching;Sore Pain Intervention(s): Monitored during session;Repositioned;Limited activity within patient's tolerance     Home Living                      Prior Function            PT Goals (current goals can now be found in the care plan section) Acute Rehab PT Goals Patient Stated Goal: to have less pain PT Goal Formulation: With patient/family Time For Goal Achievement: 11/07/17 Potential to Achieve Goals: Good Progress towards PT goals: Progressing toward goals;Goals met and updated - see care plan    Frequency    Min 3X/week      PT Plan Current plan remains appropriate    Co-evaluation              AM-PAC PT "6 Clicks" Daily Activity  Outcome Measure  Difficulty turning over in bed (including adjusting bedclothes, sheets and blankets)?: None Difficulty moving from lying on back to sitting on the side of the bed? : None Difficulty sitting down on and standing up from a chair with arms (e.g., wheelchair, bedside commode, etc,.)?: None Help needed moving to and from a bed to chair (including a wheelchair)?: None Help needed walking in hospital room?: None Help needed climbing 3-5 steps with a railing? : A Little 6 Click Score: 23    End of Session Equipment Utilized During Treatment: Gait belt Activity Tolerance: Patient tolerated treatment well Patient left: in bed;with call bell/phone within reach Nurse Communication: Mobility status PT Visit Diagnosis: Unsteadiness on feet (R26.81);Other abnormalities of gait and mobility (R26.89) Pain - Right/Left: Left Pain - part of body: Knee     Time: 2761-4709 PT Time Calculation (min) (ACUTE ONLY): 13 min  Charges:  $Gait Training: 8-22 mins                    G Codes:      [name], SPT   [name] 10/24/2017, 3:21 PM

## 2017-10-25 NOTE — Progress Notes (Addendum)
Occupational Therapy Treatment Patient Details Name: Gary Hicks MRN: [id number] DOB: 1985/09/04 Today's Date: 10/25/2017    History of present illness Pt is a 32 y/o male with active IV drug use presenting with generalized pain. Family is concerned about seizures due to finding him in fetal position with abnormal eye movements. He reports global polyarthralgia, use of heroin and methamphetamines in the past 24 hours at time of admission. Diagnosed with staph bacteremia, severe sepsis. PMH bacterial endocarditis, heroin abuse, possible Hepatitis C, opiate abuse, polysubstance abuse, seizures.  Pt also with positive right LE DVT on 2/18.  On Lovenox.  Pt is s/p I & D of bilat hips on 3/5.    OT comments  Returned for additional visit to initiate LUE HEP, pt supine in bed and agreeable to treatment. Pt completing theraputty, fine motor, and ROM exercises using LUE with min verbal cues throughout; corresponding handouts provided. Will continue to follow acutely to progress exercise program and to provide additional strengthening/fine motor activities PRN.     Follow Up Recommendations  No OT follow up    Equipment Recommendations  None recommended by OT          Precautions / Restrictions Precautions Precautions: Fall Restrictions Weight Bearing Restrictions: No RLE Weight Bearing: Weight bearing as tolerated LLE Weight Bearing: Weight bearing as tolerated                                                     ADL either performed or assessed with clinical judgement   ADL                                         General ADL Comments: pt seen for LUE strengthening/fine motor activities; issued tan theraputty LUE fine motor exercises with pt return demonstrating with min verbal cues; educated pt to complete HEP on his own throughout the [name]/week with pt verbalizing understanding                       Cognition Arousal/Alertness:  Awake/alert Behavior During Therapy: Flat affect Overall Cognitive Status: Within Functional Limits for tasks assessed                                          Exercises Hand Exercises Digit Composite Flexion: AROM;5 reps;Left;Supine Composite Extension: AROM;5 reps;Left;Supine Digit Lifts: AROM;5 reps;Left;Supine Opposition: AROM;5 reps;Left;Supine Other Exercises Other Exercises: pt performed tendon gliding exercise with LUE x3 times through with min verbal cues Other Exercises: pt completing theraputty exercises with LUE using tan theraputty and HEP and with min verbal cues                Pertinent Vitals/ Pain       Pain Assessment: Faces Faces Pain Scale: Hurts little more Pain Location: L hand with ROM Pain Descriptors / Indicators: Aching;Grimacing Pain Intervention(s): Monitored during session  Home Living Family/patient expects to be discharged to:: Private residence Living Arrangements: Spouse/significant other Available Help at Discharge: Family Type of Home: Other(Comment)(condo) Home Access: Level entry     Home Layout: One level     Bathroom Shower/Tub: Tub/shower  unit   Bathroom Toilet: Standard     Home Equipment: None          Prior Functioning/Environment Level of Independence: Independent            Frequency  Min 2X/week        Progress Toward Goals  OT Goals(current goals can now be found in the care plan section)  Progress towards OT goals: Progressing toward goals  Acute Rehab OT Goals Patient Stated Goal: to have less pain OT Goal Formulation: With patient Time For Goal Achievement: 11/08/17 Potential to Achieve Goals: Good ADL Goals Pt/caregiver will Perform Home Exercise Program: Increased strength;Increased ROM;Left upper extremity;With theraputty;With written HEP provided(fine motor/coordination)  Plan Discharge plan remains appropriate                     AM-PAC PT "6 Clicks" Daily  Activity     Outcome Measure   Help from another person eating meals?: None Help from another person taking care of personal grooming?: None Help from another person toileting, which includes using toliet, bedpan, or urinal?: None Help from another person bathing (including washing, rinsing, drying)?: None Help from another person to put on and taking off regular upper body clothing?: None Help from another person to put on and taking off regular lower body clothing?: None 6 Click Score: 24    End of Session    OT Visit Diagnosis: Other symptoms and signs involving the nervous system (R29.898)   Activity Tolerance Patient tolerated treatment well   Patient Left in bed;with call bell/phone within reach   Nurse Communication Mobility status        Time: 1211-1224 OT Time Calculation (min): 13 min  Charges: OT General Charges $OT Visit: 1 Visit OT Evaluation $OT Eval Low Complexity: 1 Low OT Treatments $Therapeutic Activity: 8-22 mins  Lou Cal, OT Pager [phone] 10/25/2017    Gary Hicks 10/25/2017, 1:34 PM

## 2017-10-25 NOTE — Evaluation (Signed)
Occupational Therapy Evaluation Patient Details Name: Gary Hicks MRN: [id number] DOB: 05/16/1986 Today's Date: 10/25/2017    History of Present Illness Pt is a 32 y/o male with active IV drug use presenting with generalized pain. Family is concerned about seizures due to finding him in fetal position with abnormal eye movements. He reports global polyarthralgia, use of heroin and methamphetamines in the past 24 hours at time of admission. Diagnosed with staph bacteremia, severe sepsis. PMH bacterial endocarditis, heroin abuse, possible Hepatitis C, opiate abuse, polysubstance abuse, seizures.  Pt also with positive right LE DVT on 2/18.  On Lovenox.  Pt is s/p I & D of bilat hips on 3/5.    Clinical Impression   OT orders received to address Pt's LUE functional impairments due to ulnar neuropathy. Pt presenting with decreased sensation, and strength in L digits; able to move digits through full AROM, though with increased effort and pain during ROM completion. Pt will benefit from further acute OT services to address LUE deficits and to maximize LUE function; pt does not currently require bracing/splints, feel deficits can be managed with strengthening/fine motor activities, will continue to monitor and address PRN.     Follow Up Recommendations  No OT follow up    Equipment Recommendations  None recommended by OT           Precautions / Restrictions Precautions Precautions: Fall Restrictions Weight Bearing Restrictions: No RLE Weight Bearing: Weight bearing as tolerated LLE Weight Bearing: Weight bearing as tolerated                                                                                          ADL either performed or assessed with clinical judgement   ADL                                         General ADL Comments: OT order received to address ulnar neuropathy in LUE; ADLs not assessed at this time                        Pertinent Vitals/Pain Pain Assessment: Faces Faces Pain Scale: Hurts little more Pain Location: L hand with ROM Pain Descriptors / Indicators: Aching;Grimacing Pain Intervention(s): Monitored during session     Hand Dominance Right   Extremity/Trunk Assessment Upper Extremity Assessment Upper Extremity Assessment: LUE deficits/detail LUE Deficits / Details: weakness noted in L grip, moreso in 4th/5th digits, pt reports numbness/decreased sensation in 4th and 5th digit (volar>dorsal); pt able to move digits through full ROM, with increased effort noted in 5th digit  LUE Sensation: decreased light touch LUE Coordination: decreased fine motor           Communication Communication Communication: No difficulties   Cognition Arousal/Alertness: Awake/alert Behavior During Therapy: Flat affect Overall Cognitive Status: Within Functional Limits for tasks assessed  Home Living Family/patient expects to be discharged to:: Private residence Living Arrangements: Spouse/significant other Available Help at Discharge: Family Type of Home: Other(Comment)(condo) Home Access: Level entry     Home Layout: One level     Bathroom Shower/Tub: Teacher, early years/pre: [place]: None          Prior Functioning/Environment Level of Independence: Independent                 OT Problem List: Impaired UE functional use;Pain;Decreased strength      OT Treatment/Interventions: Therapeutic exercise;Therapeutic activities    OT Goals(Current goals can be found in the care plan section) Acute Rehab OT Goals Patient Stated Goal: to have less pain OT Goal Formulation: With patient Time For Goal Achievement: 11/08/17 Potential to Achieve Goals: Good  OT Frequency: Min 2X/week    AM-PAC PT "6 Clicks" Daily Activity     Outcome Measure Help from another  person eating meals?: None Help from another person taking care of personal grooming?: None Help from another person toileting, which includes using toliet, bedpan, or urinal?: None Help from another person bathing (including washing, rinsing, drying)?: None Help from another person to put on and taking off regular upper body clothing?: None Help from another person to put on and taking off regular lower body clothing?: None 6 Click Score: 24   End of Session Nurse Communication: Mobility status  Activity Tolerance: Patient tolerated treatment well Patient left: in bed;with call bell/phone within reach  OT Visit Diagnosis: Other symptoms and signs involving the nervous system (W97.989)                Time: 2119-4174 OT Time Calculation (min): 9 min Charges:  OT General Charges $OT Visit: 1 Visit OT Evaluation $OT Eval Low Complexity: 1 Low G-Codes:     Lou Cal, OT Pager [phone] 10/25/2017   Gary Hicks 10/25/2017, 1:15 PM

## 2017-10-25 NOTE — Progress Notes (Addendum)
Physical Therapy Treatment Patient Details Name: Gary Hicks MRN: [id number] DOB: January 17, 1986 Today's Date: 10/25/2017    History of Present Illness Pt is a 32 y/o male with active IV drug use presenting with generalized pain. Family is concerned about seizures due to finding him in fetal position with abnormal eye movements. He reports global polyarthralgia, use of heroin and methamphetamines in the past 24 hours at time of admission. Diagnosed with staph bacteremia, severe sepsis. PMH bacterial endocarditis, heroin abuse, possible Hepatitis C, opiate abuse, polysubstance abuse, seizures.  Pt also with positive right LE DVT on 2/18.  On Lovenox.  Pt is s/p I & D of bilat hips on 3/5.     PT Comments    Pt progressing well towards functional mobility goals. Pt with increased activity tolerance compared to previous sessions. He ambulated ~400 ft with RW then transitioned to [facility] for ~400 additional ft with min verbal cues for upright posture and to widen BOS. Pt demonstrating mod I for bed mobility and supervision for transfers and ambulation.  Updated recommendation for home equipment to [facility]. Will continue to follow and progress as tolerated.    Follow Up Recommendations  Home health PT;Supervision/Assistance - 24 hour     Equipment Recommendations  Cane    Recommendations for Other Services       Precautions / Restrictions Precautions Precautions: Fall Restrictions Weight Bearing Restrictions: No RLE Weight Bearing: Weight bearing as tolerated LLE Weight Bearing: Weight bearing as tolerated    Mobility  Bed Mobility Overal bed mobility: Modified Independent Bed Mobility: Supine to Sit;Sit to Supine     Supine to sit: Modified independent (Device/Increase time) Sit to supine: Modified independent (Device/Increase time)   General bed mobility comments: mod I for increased time  Transfers Overall transfer level: Needs assistance Equipment used: Rolling walker (2  wheeled);Straight cane Transfers: Sit to/from Stand Sit to Stand: Supervision         General transfer comment: supervision from EOB for safety and stability, no cues or physical assist  required  Ambulation/Gait Ambulation/Gait assistance: Supervision Ambulation Distance (Feet): 800 Feet Assistive device: Rolling walker (2 wheeled);Straight cane Gait Pattern/deviations: Step-through pattern;Antalgic;Narrow base of support Gait velocity: decreased Gait velocity interpretation: Below normal speed for age/gender General Gait Details: supervision for safety and modest instability, pt ambulated ~400 ft with RW then transitioned to [facility]. pt able to manage [facility] safely with min VCs for posture and wider BOS   Stairs            Wheelchair Mobility    Modified Rankin (Stroke Patients Only)       Balance Overall balance assessment: Needs assistance Sitting-balance support: Feet supported Sitting balance-Leahy Scale: Good     Standing balance support: During functional activity Standing balance-Leahy Scale: Fair Standing balance comment: pt able to perform unsupported static standing, requires single UE support for balance during ambulation                            Cognition Arousal/Alertness: Awake/alert Behavior During Therapy: Flat affect Overall Cognitive Status: Within Functional Limits for tasks assessed                                        Exercises Hand Exercises Digit Composite Flexion: AROM;5 reps;Left;Supine Composite Extension: AROM;5 reps;Left;Supine Digit Lifts: AROM;5 reps;Left;Supine Opposition: AROM;5 reps;Left;Supine Other Exercises Other Exercises:  pt performed tendon gliding exercise with LUE x3 times through with min verbal cues Other Exercises: pt completing theraputty exercises with LUE using tan theraputty and HEP and with min verbal cues    General Comments General comments (skin integrity, edema, etc.): VSS       Pertinent Vitals/Pain Pain Assessment: Faces Faces Pain Scale: Hurts little more Pain Location: (L lateral knee) Pain Descriptors / Indicators: Aching;Grimacing Pain Intervention(s): Limited activity within patient's tolerance;Monitored during session;Repositioned    Home Living Family/patient expects to be discharged to:: Private residence Living Arrangements: Spouse/significant other Available Help at Discharge: Family Type of Home: Other(Comment)(condo) Home Access: Level entry   Home Layout: One level Home Equipment: None      Prior Function Level of Independence: Independent          PT Goals (current goals can now be found in the care plan section) Acute Rehab PT Goals Patient Stated Goal: to have less pain and go home PT Goal Formulation: With patient/family Time For Goal Achievement: 11/07/17 Potential to Achieve Goals: Good Progress towards PT goals: Progressing toward goals    Frequency    Min 3X/week      PT Plan Current plan remains appropriate    Co-evaluation              AM-PAC PT "6 Clicks" Daily Activity  Outcome Measure  Difficulty turning over in bed (including adjusting bedclothes, sheets and blankets)?: None Difficulty moving from lying on back to sitting on the side of the bed? : None Difficulty sitting down on and standing up from a chair with arms (e.g., wheelchair, bedside commode, etc,.)?: None Help needed moving to and from a bed to chair (including a wheelchair)?: None Help needed walking in hospital room?: None Help needed climbing 3-5 steps with a railing? : A Little 6 Click Score: 23    End of Session Equipment Utilized During Treatment: Gait belt Activity Tolerance: Patient tolerated treatment well Patient left: in bed;with call bell/phone within reach Nurse Communication: Mobility status PT Visit Diagnosis: Unsteadiness on feet (R26.81);Other abnormalities of gait and mobility (R26.89) Pain - Right/Left: Left Pain -  part of body: Knee     Time: 8185-6314 PT Time Calculation (min) (ACUTE ONLY): 21 min  Charges:  $Gait Training: 8-22 mins                    G Codes:       [name], SPT   [name] 10/25/2017, 3:28 PM

## 2017-10-25 NOTE — Progress Notes (Signed)
[facility]                                                                              Patient Demographics  Gary Hicks, is a 32 y.o. male, DOB - 1985/10/28, JEH:[id number]  Admit date - 09/11/2017   Admitting Physician Quintella Baton, MD  Outpatient Primary MD for the patient is Patient, [name] Per  Outpatient specialists:   LOS - 44  days   Medical records reviewed and are as summarized below:    Chief Complaint  Patient presents with  . Weakness  . Leg Pain       Brief summary   32 year old male, active IVDA, who presents with generalized pain. Patient was incarcerated and released 3-4 weeks ago. Patient admits using IV methamphetamine as well as heroin in the past 24 hours prior to admission. Reports global polyarthralgias including his bilateral feet, bilateral knees, bilateral hips and shoulders. Also reports fevers and chills generalized myalgias. Pt admitted for further management. 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 Pt was also diagnosed with DVT Right gastrocnemius vein . He was started on Lovenox 2/18.He was also diagnosed with left knee septic infection. He underwent bursectomy and I and D by Dr Percell Miller 2-25.He reported worsening left hip pain 3-02, MRI showed, B/L hipSeptic arthritis and 13 mm fluid collection in the posterior aspect of the left obturator internus muscle most concerning for a small abscess.IR was consulted for arthrocentesis, patient couldn't tolerate procedure. Dr Percell Miller performed I and D of bilateral hip on 3/5.  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  Blood cultures 2/11 grew MSSA, repeat on 2/15 and 3/7 have been negative -2D echo showed large 1.1 cmx 1.8 cm multilobulated highly mobile vegetation on the right ventricular aspect of the  anterior leaflet of the tricuspid valve.  Moderate to severe tricuspid regurgitation. -ID was consulted, recommended continue IV Ancef for 6 weeks from 2/23 till 11/05/17 -Ancef changed to cefepime on 3/21 due to new fevers.  ID was reconsulted and suspected septic embolization from endocarditis causing pleuritic right-sided chest pain dyspnea, cough, possible H CAP.  Patient also has left-sided pleural effusion that he declined to do thoracentesis. -History of IV drug use, currently on Suboxone, difficult placement hence patient will stay inpatient for the duration of antibiotics -CTA chest negative for PE, no chest pain currently -ID recommended complete cefepime on 3/26, then continue cefazolin for MSSA endocarditis till 11/05/17.  ID will follow up in the office outpatient.  Septic left knee, bilateral hip joints -MRI of the left knee showed 4.8 x0.8x 4 cm fluid collection in the subcutaneous fat anterior to patella tendon with severe surrounding soft tissue edema concerning for septic arthritis. -MRI hip showed septic arthritis and 13 mm fluid collection in the posterior aspect of the left obturator internus muscle concerning for small abscess -Orthopedics was consulted (Dr Percell Miller).  Status post I&D and bursectomy of the left knee prepatellar bursitis on 09/23/17, status post I&D of septic arthritis bilateral hip  on 10/04/17 -Cultures intraoperatively negative -MRI brain negative for abscess, Doppler of the left arm negative for DVT.   Left-sided pleural effusion -Seen on CT scan, moderate, patient has decided not to pursue thoracentesis at all. - Patient has repeatedly declined thoracentesis.  DVT right gastrocnemius vein with superficial saphenous thrombophlebitis -Continue Eliquis.  History of IV drug use -Dr. Damita Dunnings managing Suboxone twice daily -No high-dose IV benzos unless required for seizures, no extra narcotics  Malnutrition -Continue nutritional supplements, dietitian was  consulted  Generalized anxiety disorder -Psych was consulted, continue Zoloft 50 mg daily -May continue Klonopin as needed for 2 weeks and then wean off, not advised for long-term. -Psych was reconsulted for follow-up, recommended increasing the Zoloft and tapering off the Klonopin  Seizures -Likely due to drug use, continue seizure precautions, as needed Ativan  -MRI brain negative for any abscess  Iron deficiency anemia -H&H stable, continue iron supplementation  Thrombocytopenia -Resolved  Hyponatremia, hypokalemia -Resolved  Hepatitis C antibody positive -ID following  Vitamin D deficiency - Continue supplementation  Constipation -Resolved, continue Senokot S, MiraLAX   Code Status: full  DVT Prophylaxis: Apixaban Family Communication: Discussed in detail with the patient, all imaging results, lab results explained to the patient    Disposition Plan: Will stay inpatient for the duration of IV antibiotics, end date 11/05/17, unless change recommended by ID.      Time Spent in minutes   25 minutes  Procedures:  I&D of left knee on 09/23/17 I&D of bilateral hip on 10/04/17  Consultants:   Infectious disease Orthopedics  Antimicrobials:   IV Ancef till 11/05/17  Cefepime to complete on 3/26 today   Medications  Scheduled Meds: . apixaban  5 mg Oral BID  . buprenorphine-naloxone  1 tablet Sublingual BID  . famotidine  20 mg Oral Daily  . feeding supplement (ENSURE ENLIVE)  237 mL Oral TID BM  . ferrous sulfate  325 mg Oral BID WC  . hydrocortisone cream   Topical BID  . multivitamin with minerals  1 tablet Oral Daily  . nicotine  14 mg Transdermal Daily  . polyethylene glycol  17 g Oral BID  . senna-docusate  1 tablet Oral BID  . sertraline  50 mg Oral Daily  . sodium chloride flush  10-40 mL Intracatheter Q12H  . Vitamin D (Ergocalciferol)  50,000 Units Oral Q Mon   Continuous Infusions: . [START ON 10/26/2017]  ceFAZolin (ANCEF) IV    .  ceFEPime (MAXIPIME) IV 2 g (10/25/17 0902)  . lactated ringers 10 mL/hr at 09/23/17 1329  . lactated ringers 10 mL/hr at 10/04/17 1237   PRN Meds:.acetaminophen, alum & mag hydroxide-simeth, clonazePAM, hydrOXYzine, ondansetron **OR** ondansetron (ZOFRAN) IV, oxyCODONE, sodium chloride, sodium chloride flush, sodium chloride flush, traZODone   Antibiotics   Anti-infectives (From admission, onward)   Start     Dose/Rate Route Frequency Ordered Stop   10/26/17 0600  ceFAZolin (ANCEF) IVPB 2g/100 mL premix     2 g 200 mL/hr over 30 Minutes Intravenous Every 8 hours 10/21/17 1123 11/06/17 0559   10/20/17 1600  ceFEPIme (MAXIPIME) 2 g in sodium chloride 0.9 % 100 mL IVPB     2 g 200 mL/hr over 30 Minutes Intravenous Every 8 hours 10/20/17 1517 10/25/17 2359   09/23/17 0600  ceFAZolin (ANCEF) IVPB 2g/100 mL premix     2 g 200 mL/hr over 30 Minutes Intravenous To Surgery 09/22/17 1956 09/23/17 1436   09/23/17 0600  ceFAZolin (ANCEF) IVPB 2g/100 mL premix  Status:  Discontinued     2 g 200 mL/hr over 30 Minutes Intravenous On call to O.R. 09/22/17 1956 09/22/17 2002   09/15/17 1500  Oritavancin Diphosphate (ORBACTIV) 1,200 mg in dextrose 5 % IVPB     1,200 mg 333.3 mL/hr over 180 Minutes Intravenous Once 09/15/17 1357 09/15/17 2100   09/12/17 1400  ceFAZolin (ANCEF) IVPB 2g/100 mL premix  Status:  Discontinued     2 g 200 mL/hr over 30 Minutes Intravenous Every 8 hours 09/12/17 1300 10/20/17 1517   09/11/17 2100  vancomycin (VANCOCIN) IVPB 750 mg/150 ml premix  Status:  Discontinued     750 mg 150 mL/hr over 60 Minutes Intravenous Every 8 hours 09/11/17 2015 09/12/17 1300        Subjective:   Gary Hicks was seen and examined today.  Denies any specific complaints today.  No fevers, still feels somewhat congested, right-sided pleuritic chest pain improving. Patient denies dizziness, abdominal pain, N/V/D/C, new weakness, numbess, tingling. No acute events overnight.    Objective:    Vitals:   10/24/17 1705 10/24/17 2035 10/25/17 0417 10/25/17 0909  BP: (!) 88/58 106/66 100/61 97/64  Pulse: (!) 102 96 96 98  Resp:   19 18  Temp: 98.9 F (37.2 C) 98.6 F (37 C) 98.7 F (37.1 C) 98.2 F (36.8 C)  TempSrc: Oral Oral Oral Oral  SpO2: 96% 97% 98% 98%  Weight:  61.2 kg (134 lb 14.7 oz)    Height:        Intake/Output Summary (Last 24 hours) at 10/25/2017 1154 Last data filed at 10/25/2017 1000 Gross per 24 hour  Intake 1254 ml  Output 650 ml  Net 604 ml     Wt Readings from Last 3 Encounters:  10/24/17 61.2 kg (134 lb 14.7 oz)  09/10/17 63.5 kg (140 lb)  01/05/15 63.5 kg (140 lb)     Exam  General: Alert and oriented x 3, NAD  Eyes:   HEENT:   Cardiovascular: S1 S2 auscultated, Regular rate and rhythm.  Respiratory: Diminished breath sounds at the bases with scattered rhonchi   Gastrointestinal: Soft, nontender, nondistended, + bowel sounds  Ext: no pedal edema bilaterally  Neuro: no new deficits  Musculoskeletal: No digital cyanosis, clubbing  Skin: Tattoos  Psych: Normal affect and demeanor, alert and oriented x3    Data Reviewed:  I have personally reviewed following labs and imaging studies  Micro Results Recent Results (from the past 240 hour(s))  Culture, blood (routine x 2)     Status: None   Collection Time: 10/19/17  2:06 PM  Result Value Ref Range Status   Specimen Description BLOOD BLOOD LEFT WRIST  Final   Special Requests   Final    BOTTLES DRAWN AEROBIC ONLY Blood Culture results may not be optimal due to an inadequate volume of blood received in culture bottles   Culture   Final    NO GROWTH 5 DAYS Performed at [facility], [place] [address], [place], [place] [address]    Report Status 10/24/2017 FINAL  Final    Radiology Reports Dg Chest 2 View  Result Date: 10/19/2017 CLINICAL DATA:  New onset of fever. Recent episodes of chest pain. History of endocarditis, substance abuse, current smoker. EXAM: CHEST  - 2 VIEW COMPARISON:  Chest x-ray and chest CT scan of October 13, 2017 FINDINGS: The lungs are adequately inflated. There is new airspace opacity in the right lower lung. There is persistent increased density in the  left mid and lower lung with obscuration of the left hemidiaphragm. The heart is top-normal in size. The pulmonary vascularity is normal. The right PICC line tip projects over the distal third of the SVC. IMPRESSION: Persistent left lower lobe atelectasis or pneumonia with moderate-sized left pleural effusion. New infiltrate in the right lower lobe compatible with pneumonia. Electronically Signed   By: David  Martinique M.D.   On: 10/19/2017 13:32   Dg Chest 2 View  Result Date: May 10, 202019 CLINICAL DATA:  Chest pain. EXAM: CHEST - 2 VIEW COMPARISON:  Radiograph of October 06, 2017. FINDINGS: The heart size and mediastinal contours are within normal limits. No pneumothorax is noted. Right lung is clear. Right-sided PICC line is unchanged in position. Stable large left pleural effusion is noted with probable underlying atelectasis or infiltrate. The visualized skeletal structures are unremarkable. IMPRESSION: Stable large left pleural effusion is noted with probable underlying atelectasis or infiltrate. Electronically Signed   By: Marijo Conception, M.D.   On: 0May 10, 202019 15:53   Ct Angio Chest Pe W Or Wo Contrast  Result Date: 10/15/2017 CLINICAL DATA:  Evaluate for pulmonary embolus. Shortness of breath. EXAM: CT ANGIOGRAPHY CHEST WITH CONTRAST TECHNIQUE: Multidetector CT imaging of the chest was performed using the standard protocol during bolus administration of intravenous contrast. Multiplanar CT image reconstructions and MIPs were obtained to evaluate the vascular anatomy. CONTRAST:  172mL ISOVUE-370 IOPAMIDOL (ISOVUE-370) INJECTION 76% COMPARISON:  Chest CT 09/25/2017. FINDINGS: Cardiovascular: Normal heart size. No pericardial effusion. Normal caliber thoracic aorta. Central venous catheter tip  terminates in the superior vena cava. Adequate opacification of the pulmonary arterial system. Motion artifact limits evaluation. No filling defect identified to suggest acute pulmonary embolus. Mediastinum/Nodes: No enlarged axillary, mediastinal or hilar lymphadenopathy. Esophagus is normal in appearance. Lungs/Pleura: Central airways are patent. Endobronchial material within the mid and peripheral right lower lobe bronchi which may represent aspiration. Focal consolidation surrounding the peripheral right lower lobe bronchi. Large layering left pleural effusion. There is consolidation involving the left lower lobe. 6 mm left upper lobe nodule (image 37; series 9), in the location of a prior cyst. 7 mm cystic change right upper lobe peripherally (image 59; series 9) in the area of a prior pulmonary nodule. Previously described right lower lobe nodule is significantly decreased in size measuring 8 mm (image 107; series 8), previously 13 mm. Upper Abdomen: Reflux of contrast into the hepatic veins. No acute process. Musculoskeletal: No aggressive or acute appearing osseous lesions. Review of the MIP images confirms the above findings. IMPRESSION: 1. No evidence of acute pulmonary embolus. 2. Interval decrease in size of many of the previously described pulmonary nodules, likely secondary to improving infectious/inflammatory process. 3. Endobronchial material within the peripheral right lower lobe bronchi with adjacent consolidation which may represent aspiration. 4. Interval increase in size of large left pleural effusion and left basilar atelectasis. 5. Reflux of contrast into the hepatic veins raising the possibility of right heart insufficiency. Electronically Signed   By: Lovey Newcomer M.D.   On: 10/15/2017 12:24   Ct Angio Chest Pe W Or Wo Contrast  Result Date: 09/25/2017 CLINICAL DATA:  32 year old male with LEFT LOWER extremity thrombosis. IV drug abuser. Generalized pain. Fever chills and myalgias. EXAM:  CT ANGIOGRAPHY CHEST WITH CONTRAST TECHNIQUE: Multidetector CT imaging of the chest was performed using the standard protocol during bolus administration of intravenous contrast. Multiplanar CT image reconstructions and MIPs were obtained to evaluate the vascular anatomy. CONTRAST:  113mL ISOVUE-370 IOPAMIDOL (ISOVUE-370) INJECTION 76%  COMPARISON:  06/06/2016 and prior chest radiographs FINDINGS: Cardiovascular: This is a technically satisfactory study. No pulmonary emboli are identified. Old healed UPPER limits normal heart size noted. No evidence of thoracic aortic aneurysm or pericardial effusion. Mediastinum/Nodes: No enlarged mediastinal, hilar, or axillary lymph nodes. Thyroid gland, trachea, and esophagus demonstrate no significant findings. Lungs/Pleura: There are multiple irregular bilateral pulmonary nodules with index lesions as follows: An 8 x 13 mm SUPERIOR segment RIGHT LOWER lobe nodule (6:42) A 4 x 7 mm RIGHT UPPER lobe nodule (6:56) A 12 x 20 mm RIGHT LOWER lobe nodule (6:88) A 28 x 22 mm LEFT LOWER lobe nodule (6:91). These nodules are nonspecific but most likely represent infection or possibly septic emboli. Malignancy/metastatic disease is considered less likely. A small LEFT pleural effusion and LEFT basilar atelectasis noted. Upper Abdomen: No acute abnormality. Musculoskeletal: No acute abnormality or suspicious bony lesion. Review of the MIP images confirms the above findings. IMPRESSION: 1. Multiple bilateral pulmonary nodules as described. Favor infection or possibly septic emboli. Other inflammatory processes or malignancy/metastatic disease considered less likely. 2. Small LEFT pleural effusion and LEFT basilar atelectasis 3. No evidence of pulmonary emboli or thoracic aortic aneurysm. Electronically Signed   By: Margarette Canada M.D.   On: 09/25/2017 18:42   Mr Hip Right W Wo Contrast  Result Date: 10/02/2017 CLINICAL DATA:  Bilateral hip and back pain.  Left worse than right. EXAM: MRI OF  THE LEFT HIP WITHOUT AND WITH CONTRAST TECHNIQUE: Multiplanar, multisequence MR imaging was performed both before and after administration of intravenous contrast. CONTRAST:  83mL MULTIHANCE GADOBENATE DIMEGLUMINE 529 MG/ML IV SOLN COMPARISON:  None. FINDINGS: Bones: No hip fracture, dislocation or avascular necrosis. Marrow edema in the left femoral head and acetabulum. Mild marrow edema in the anterior right acetabulum. Bilateral pericapsular edema and enhancement. No periosteal reaction or bone destruction. No aggressive osseous lesion. Normal sacrum and sacroiliac joints. No SI joint widening or erosive changes. Articular cartilage and labrum Articular cartilage: Partial-thickness bilateral femoral heads and acetabulum, left greater than right. Small femoral inferior marginal osteophytes. Labrum: Grossly intact, but evaluation is limited by lack of intraarticular fluid. Joint or bursal effusion Joint effusion: Large left hip joint effusion with severe synovitis and enhancement. Small right hip joint effusion with synovitis and enhancement. No SI joint effusion. Bursae:  No bursa formation. Muscles and tendons Muscle edema in the left obturator internus muscle with a 13 mm fluid collection posteriorly within the muscle concerning for a small abscess. Muscle edema and mild enhancement in the adductor musculature bilaterally and right iliacus muscle. Muscle edema and enhancement of bilateral multifidus muscles. Other findings Miscellaneous: No pelvic free fluid. No fluid collection or hematoma. No inguinal lymphadenopathy. No inguinal hernia. IMPRESSION: 1. Large left hip joint effusion with enhancing synovitis, pericapsular edema and severe marrow edema on either side of the joint most concerning for septic arthritis. 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 2. Small right hip joint effusion with enhancing synovitis and pericapsular edema and enhancement most concerning for septic  arthritis. 3. Muscle edema within the adductor musculature bilaterally and multifidus muscles bilaterally most concerning for infectious myositis. 13 mm fluid collection in the posterior aspect of the left obturator internus muscle most concerning for a small abscess. Electronically Signed   By: Kathreen Devoid   On: 10/02/2017 11:36   Mr Hip Left W Wo Contrast  Result Date: 10/02/2017 CLINICAL DATA:  Bilateral hip and back pain.  Left worse than right. EXAM: MRI  OF THE LEFT HIP WITHOUT AND WITH CONTRAST TECHNIQUE: Multiplanar, multisequence MR imaging was performed both before and after administration of intravenous contrast. CONTRAST:  61mL MULTIHANCE GADOBENATE DIMEGLUMINE 529 MG/ML IV SOLN COMPARISON:  None. FINDINGS: Bones: No hip fracture, dislocation or avascular necrosis. Marrow edema in the left femoral head and acetabulum. Mild marrow edema in the anterior right acetabulum. Bilateral pericapsular edema and enhancement. No periosteal reaction or bone destruction. No aggressive osseous lesion. Normal sacrum and sacroiliac joints. No SI joint widening or erosive changes. Articular cartilage and labrum Articular cartilage: Partial-thickness bilateral femoral heads and acetabulum, left greater than right. Small femoral inferior marginal osteophytes. Labrum: Grossly intact, but evaluation is limited by lack of intraarticular fluid. Joint or bursal effusion Joint effusion: Large left hip joint effusion with severe synovitis and enhancement. Small right hip joint effusion with synovitis and enhancement. No SI joint effusion. Bursae:  No bursa formation. Muscles and tendons Muscle edema in the left obturator internus muscle with a 13 mm fluid collection posteriorly within the muscle concerning for a small abscess. Muscle edema and mild enhancement in the adductor musculature bilaterally and right iliacus muscle. Muscle edema and enhancement of bilateral multifidus muscles. Other findings Miscellaneous: No pelvic  free fluid. No fluid collection or hematoma. No inguinal lymphadenopathy. No inguinal hernia. IMPRESSION: 1. Large left hip joint effusion with enhancing synovitis, pericapsular edema and severe marrow edema on either side of the joint most concerning for septic arthritis. 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 2. Small right hip joint effusion with enhancing synovitis and pericapsular edema and enhancement most concerning for septic arthritis. 3. Muscle edema within the adductor musculature bilaterally and multifidus muscles bilaterally most concerning for infectious myositis. 13 mm fluid collection in the posterior aspect of the left obturator internus muscle most concerning for a small abscess. Electronically Signed   By: Kathreen Devoid   On: 10/02/2017 11:36   Dg Chest Port 1 View  Result Date: 10/06/2017 CLINICAL DATA:  Fever and left upper chest pain for 1 week. EXAM: PORTABLE CHEST 1 VIEW COMPARISON:  Chest CTA 09/25/2017 FINDINGS: A right PICC terminates over the high right atrium. The cardiac silhouette is normal in size. Veiling opacity in the left mid and lower hemithorax is consistent with a persistent, small to moderate-sized pleural effusion. Mild parenchymal opacity in the left lung base likely represents atelectasis. No right lung consolidation is seen. Nodular right lung opacities on the prior CT are not clearly apparent on this radiograph. No pneumothorax is identified. No acute osseous abnormality is seen. IMPRESSION: Left pleural effusion and mild left basilar atelectasis. Electronically Signed   By: Logan Bores M.D.   On: 10/06/2017 11:12   Dg Hips Bilat With Pelvis 2v  Result Date: 10/01/2017 CLINICAL DATA:  Hip pain. EXAM: DG HIP (WITH OR WITHOUT PELVIS) 2V BILAT COMPARISON:  None. FINDINGS: There are degenerative changes in the left hip with an osteophyte off the femoral neck. No significant loss of joint space. No fractures or dislocations identified. No other  acute abnormalities. 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  No other abnormalities. Electronically Signed   By: Dorise Bullion III M.D   On: 10/01/2017 12:46   [place] Ekg Site Rite  Result Date: 09/25/2017 If Site Rite image not attached, placement could not be confirmed due to current cardiac rhythm.   Lab Data:  CBC: Recent Labs  Lab 10/18/17 1654 10/20/17 0426 10/23/17 0342  WBC 12.4* 12.5* 8.6  HGB 8.7*  8.2* 8.0*  HCT 27.7* 25.6* 25.7*  MCV 92.3 91.4 90.8  PLT 278 288 768   Basic Metabolic Panel: Recent Labs  Lab 10/18/17 1654 10/20/17 0426 10/23/17 0342  NA 133* 130* 134*  K 4.1 4.1 4.2  CL 93* 93* 96*  CO2 29 26 28   GLUCOSE 112* 119* 111*  BUN 10 9 8   CREATININE 0.71 0.58* 0.56*  CALCIUM 8.5* 8.1* 8.3*   GFR: Estimated Creatinine Clearance: 115.8 mL/min (A) (by C-G formula based on SCr of 0.56 mg/dL (L)). Liver Function Tests: No results for input(s): AST, ALT, ALKPHOS, BILITOT, PROT, ALBUMIN in the last 168 hours. No results for input(s): LIPASE, AMYLASE in the last 168 hours. No results for input(s): AMMONIA in the last 168 hours. Coagulation Profile: No results for input(s): INR, PROTIME in the last 168 hours. Cardiac Enzymes: No results for input(s): CKTOTAL, CKMB, CKMBINDEX, TROPONINI in the last 168 hours. BNP (last 3 results) No results for input(s): PROBNP in the last 8760 hours. HbA1C: No results for input(s): HGBA1C in the last 72 hours. CBG: No results for input(s): GLUCAP in the last 168 hours. Lipid Profile: No results for input(s): CHOL, HDL, LDLCALC, TRIG, CHOLHDL, LDLDIRECT in the last 72 hours. Thyroid Function Tests: No results for input(s): TSH, T4TOTAL, FREET4, T3FREE, THYROIDAB in the last 72 hours. Anemia Panel: No results for input(s): VITAMINB12, FOLATE, FERRITIN, TIBC, IRON, RETICCTPCT in the last 72 hours. Urine analysis:    Component Value Date/Time   COLORURINE YELLOW 10/06/2017 [place]  10/06/2017 1746   LABSPEC 1.017 10/06/2017 1746   PHURINE 7.0 10/06/2017 1746   GLUCOSEU NEGATIVE 10/06/2017 1746   HGBUR MODERATE (A) 10/06/2017 1746   BILIRUBINUR NEGATIVE 10/06/2017 1746   KETONESUR NEGATIVE 10/06/2017 1746   PROTEINUR NEGATIVE 10/06/2017 1746   UROBILINOGEN 0.2 05/16/2012 0847   NITRITE NEGATIVE 10/06/2017 1746   LEUKOCYTESUR NEGATIVE 10/06/2017 1746     Ripudeep Rai M.D. [facility] 10/25/2017, 11:54 AM  Pager: [phone] Between 7am to 7pm - call Pager - [phone]  After 7pm go to [email or web address] - password [email or web address]  Call night coverage person covering after 7pm

## 2017-10-26 LAB — BASIC METABOLIC PANEL
ANION GAP: 10 (ref 5–15)
BUN: 5 mg/dL — ABNORMAL LOW (ref 6–20)
CO2: 27 mmol/L (ref 22–32)
Calcium: 8.7 mg/dL — ABNORMAL LOW (ref 8.9–10.3)
Chloride: 97 mmol/L — ABNORMAL LOW (ref 101–111)
Creatinine, Ser: 0.56 mg/dL — ABNORMAL LOW (ref 0.61–1.24)
GFR calc Af Amer: >60 mL/min (ref >60)
Glucose, Bld: 97 mg/dL (ref 65–99)
Potassium: 4.2 mmol/L (ref 3.5–5.1)
Sodium: 134 mmol/L — ABNORMAL LOW (ref 135–145)

## 2017-10-26 LAB — CBC
HEMATOCRIT: 28.1 % — AB (ref 39.0–52.0)
Hemoglobin: 8.8 g/dL — ABNORMAL LOW (ref 13.0–17.0)
MCH: 28.5 pg (ref 26.0–34.0)
MCHC: 31.3 g/dL (ref 30.0–36.0)
MCV: 90.9 fL (ref 78.0–100.0)
PLATELETS: 314 10*3/uL (ref 150–400)
RBC: 3.09 MIL/uL — AB (ref 4.22–5.81)
RDW: 14.3 % (ref 11.5–15.5)
WBC: 11.1 10*3/uL — ABNORMAL HIGH (ref 4.0–10.5)

## 2017-10-26 NOTE — Progress Notes (Signed)
[facility]                                                                              Patient Demographics  Gary Hicks, is a 32 y.o. male, DOB - 11/06/1985, ZYS:[id number]  Admit date - 09/11/2017   Admitting Physician Quintella Baton, MD  Outpatient Primary MD for the patient is Patient, [name] Per  Outpatient specialists:   LOS - 45  days   Medical records reviewed and are as summarized below:    Chief Complaint  Patient presents with  . Weakness  . Leg Pain       Brief summary   32 year old male, active IVDA, who presents with generalized pain. Patient was incarcerated and released 3-4 weeks ago. Patient admits using IV methamphetamine as well as heroin in the past 24 hours prior to admission. Reports global polyarthralgias including his bilateral feet, bilateral knees, bilateral hips and shoulders. Also reports fevers and chills generalized myalgias. Pt admitted for further management. 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 Pt was also diagnosed with DVT Right gastrocnemius vein . He was started on Lovenox 2/18.He was also diagnosed with left knee septic infection. He underwent bursectomy and I and D by Dr Percell Miller 2-25.He reported worsening left hip pain 3-02, MRI showed, B/L hipSeptic arthritis and 13 mm fluid collection in the posterior aspect of the left obturator internus muscle most concerning for a small abscess.IR was consulted for arthrocentesis, patient couldn't tolerate procedure. Dr Percell Miller performed I and D of bilateral hip on 3/5.  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  Blood cultures 2/11 grew MSSA, repeat on 2/15 and 3/7 have been negative -2D echo showed large 1.1 cmx 1.8 cm multilobulated highly mobile vegetation on the right ventricular aspect of the  anterior leaflet of the tricuspid valve.  Moderate to severe tricuspid regurgitation. -ID was consulted, recommended continue IV Ancef for 6 weeks from 2/23 till 11/05/17 -Ancef changed to cefepime on 3/21 due to new fevers, completed on 3/26.  ID was reconsulted and suspected septic embolization from endocarditis causing pleuritic right-sided chest pain dyspnea, cough, possible H CAP.  Patient also has left-sided pleural effusion that he declined to do thoracentesis. -History of IV drug use, currently on Suboxone, difficult placement hence patient will stay inpatient for the duration of antibiotics -CTA chest negative for PE, no chest pain currently -ID recommended complete cefepime on 3/26, then continue cefazolin for MSSA endocarditis till 11/05/17.  ID will follow up in the office outpatient.  Septic left knee, bilateral hip joints -MRI of the left knee showed 4.8 x0.8x 4 cm fluid collection in the subcutaneous fat anterior to patella tendon with severe surrounding soft tissue edema concerning for septic arthritis. -MRI hip showed septic arthritis and 13 mm fluid collection in the posterior aspect of the left obturator internus muscle concerning for small abscess -Orthopedics was consulted (Dr Percell Miller).  Status post I&D and bursectomy of the left knee prepatellar bursitis on 09/23/17, status post I&D of septic  arthritis bilateral hip on 10/04/17 -Cultures intraoperatively negative -MRI brain negative for abscess, Doppler of the left arm negative for DVT. -No complaints today  Left-sided pleural effusion -Seen on CT scan, moderate, patient has decided not to pursue thoracentesis at all. - Patient has repeatedly declined thoracentesis.  DVT right gastrocnemius vein with superficial saphenous thrombophlebitis -Continue Eliquis.  History of IV drug use -Dr. Damita Dunnings managing Suboxone twice daily -No high-dose IV benzos unless required for seizures, no extra narcotics  Malnutrition -Continue  nutritional supplements, dietitian was consulted  Generalized anxiety disorder -Psych was consulted, continue Zoloft 50 mg daily -May continue Klonopin as needed for 2 weeks and then wean off, not advised for long-term. -Psych was reconsulted for follow-up, recommended increasing the Zoloft and tapering off the Klonopin  Seizures -Likely due to drug use, continue seizure precautions, as needed Ativan  -MRI brain negative for any abscess  Iron deficiency anemia -H&H stable, continue iron supplementation  Thrombocytopenia -Resolved  Hyponatremia, hypokalemia -Resolved  Hepatitis C antibody positive -ID following  Vitamin D deficiency - Continue supplementation  Constipation -Resolved, continue Senokot S, MiraLAX   Code Status: full  DVT Prophylaxis: Apixaban Family Communication: Discussed in detail with the patient, all imaging results, lab results explained to the patient    Disposition Plan: Will stay inpatient for the duration of IV antibiotics, end date 11/05/17, unless change recommended by ID.    No complaints today    Time Spent in minutes   25 minutes  Procedures:  I&D of left knee on 09/23/17 I&D of bilateral hip on 10/04/17  Consultants:   Infectious disease Orthopedics  Antimicrobials:   IV Ancef till 11/05/17  Cefepime to complete on 3/26 today   Medications  Scheduled Meds: . apixaban  5 mg Oral BID  . buprenorphine-naloxone  1 tablet Sublingual BID  . famotidine  20 mg Oral Daily  . feeding supplement (ENSURE ENLIVE)  237 mL Oral TID BM  . ferrous sulfate  325 mg Oral BID WC  . hydrocortisone cream   Topical BID  . multivitamin with minerals  1 tablet Oral Daily  . nicotine  14 mg Transdermal Daily  . polyethylene glycol  17 g Oral BID  . senna-docusate  1 tablet Oral BID  . sertraline  50 mg Oral Daily  . sodium chloride flush  10-40 mL Intracatheter Q12H  . Vitamin D (Ergocalciferol)  50,000 Units Oral Q Mon   Continuous  Infusions: .  ceFAZolin (ANCEF) IV 2 g (10/26/17 1407)  . lactated ringers 10 mL/hr at 09/23/17 1329  . lactated ringers 10 mL/hr at 10/04/17 1237   PRN Meds:.acetaminophen, alum & mag hydroxide-simeth, clonazePAM, hydrOXYzine, ondansetron **OR** ondansetron (ZOFRAN) IV, oxyCODONE, sodium chloride, sodium chloride flush, sodium chloride flush, traZODone   Antibiotics   Anti-infectives (From admission, onward)   Start     Dose/Rate Route Frequency Ordered Stop   10/26/17 0600  ceFAZolin (ANCEF) IVPB 2g/100 mL premix     2 g 200 mL/hr over 30 Minutes Intravenous Every 8 hours 10/21/17 1123 11/06/17 0559   10/20/17 1600  ceFEPIme (MAXIPIME) 2 g in sodium chloride 0.9 % 100 mL IVPB     2 g 200 mL/hr over 30 Minutes Intravenous Every 8 hours 10/20/17 1517 10/25/17 1800   09/23/17 0600  ceFAZolin (ANCEF) IVPB 2g/100 mL premix     2 g 200 mL/hr over 30 Minutes Intravenous To Surgery 09/22/17 1956 09/23/17 1436   09/23/17 0600  ceFAZolin (ANCEF) IVPB 2g/100 mL premix  Status:  Discontinued     2 g 200 mL/hr over 30 Minutes Intravenous On call to O.R. 09/22/17 1956 09/22/17 2002   09/15/17 1500  Oritavancin Diphosphate (ORBACTIV) 1,200 mg in dextrose 5 % IVPB     1,200 mg 333.3 mL/hr over 180 Minutes Intravenous Once 09/15/17 1357 09/15/17 2100   09/12/17 1400  ceFAZolin (ANCEF) IVPB 2g/100 mL premix  Status:  Discontinued     2 g 200 mL/hr over 30 Minutes Intravenous Every 8 hours 09/12/17 1300 10/20/17 1517   09/11/17 2100  vancomycin (VANCOCIN) IVPB 750 mg/150 ml premix  Status:  Discontinued     750 mg 150 mL/hr over 60 Minutes Intravenous Every 8 hours 09/11/17 2015 09/12/17 1300        Subjective:   Corene Cornea Rickenbach was seen and examined today.  Denies any specific complaints.  No fevers overnight.  . Patient denies dizziness, abdominal pain, N/V/D/C, new weakness, numbess, tingling. No acute events overnight.    Objective:   Vitals:   10/25/17 1820 10/25/17 2047 10/26/17 0605  10/26/17 0915  BP: 90/65 91/61 95/62  101/68  Pulse: 92 91 89 98  Resp: 17 18 18 18   Temp: 98.1 F (36.7 C) 98.1 F (36.7 C) 98.4 F (36.9 C) 98.2 F (36.8 C)  TempSrc: Oral Oral Oral Oral  SpO2: 98% 94% 99% 96%  Weight:      Height:        Intake/Output Summary (Last 24 hours) at 10/26/2017 1446 Last data filed at 10/26/2017 0600 Gross per 24 hour  Intake 360 ml  Output 0 ml  Net 360 ml     Wt Readings from Last 3 Encounters:  10/24/17 61.2 kg (134 lb 14.7 oz)  09/10/17 63.5 kg (140 lb)  01/05/15 63.5 kg (140 lb)     Exam   General: Alert and oriented x 3, NAD  Eyes:   HEENT:    Cardiovascular: S1 S2 auscultated, RRR, no pedal edema  Respiratory: Decreased breath sound at the bases  Gastrointestinal: Soft, nontender, nondistended, + bowel sounds  Ext: no pedal edema bilaterally  Neuro: no new deficits  Musculoskeletal: No digital cyanosis, clubbing  Skin: Tattoos  Psych: Normal affect and demeanor, alert and oriented x3    Data Reviewed:  I have personally reviewed following labs and imaging studies  Micro Results Recent Results (from the past 240 hour(s))  Culture, blood (routine x 2)     Status: None   Collection Time: 10/19/17  2:06 PM  Result Value Ref Range Status   Specimen Description BLOOD BLOOD LEFT WRIST  Final   Special Requests   Final    BOTTLES DRAWN AEROBIC ONLY Blood Culture results may not be optimal due to an inadequate volume of blood received in culture bottles   Culture   Final    NO GROWTH 5 DAYS Performed at [facility], [place] [address] [place], [place] [address]    Report Status 10/24/2017 FINAL  Final    Radiology Reports Dg Chest 2 View  Result Date: 10/19/2017 CLINICAL DATA:  New onset of fever. Recent episodes of chest pain. History of endocarditis, substance abuse, current smoker. EXAM: CHEST - 2 VIEW COMPARISON:  Chest x-ray and chest CT scan of October 13, 2017 FINDINGS: The lungs are adequately inflated.  There is new airspace opacity in the right lower lung. There is persistent increased density in the left mid and lower lung with obscuration of the left hemidiaphragm. The heart is top-normal in  size. The pulmonary vascularity is normal. The right PICC line tip projects over the distal third of the SVC. IMPRESSION: Persistent left lower lobe atelectasis or pneumonia with moderate-sized left pleural effusion. New infiltrate in the right lower lobe compatible with pneumonia. Electronically Signed   By: David  Martinique M.D.   On: 10/19/2017 13:32   Dg Chest 2 View  Result Date: 05-28-202019 CLINICAL DATA:  Chest pain. EXAM: CHEST - 2 VIEW COMPARISON:  Radiograph of October 06, 2017. FINDINGS: The heart size and mediastinal contours are within normal limits. No pneumothorax is noted. Right lung is clear. Right-sided PICC line is unchanged in position. Stable large left pleural effusion is noted with probable underlying atelectasis or infiltrate. The visualized skeletal structures are unremarkable. IMPRESSION: Stable large left pleural effusion is noted with probable underlying atelectasis or infiltrate. Electronically Signed   By: Marijo Conception, M.D.   On: 005-28-202019 15:53   Ct Angio Chest Pe W Or Wo Contrast  Result Date: 10/15/2017 CLINICAL DATA:  Evaluate for pulmonary embolus. Shortness of breath. EXAM: CT ANGIOGRAPHY CHEST WITH CONTRAST TECHNIQUE: Multidetector CT imaging of the chest was performed using the standard protocol during bolus administration of intravenous contrast. Multiplanar CT image reconstructions and MIPs were obtained to evaluate the vascular anatomy. CONTRAST:  142mL ISOVUE-370 IOPAMIDOL (ISOVUE-370) INJECTION 76% COMPARISON:  Chest CT 09/25/2017. FINDINGS: Cardiovascular: Normal heart size. No pericardial effusion. Normal caliber thoracic aorta. Central venous catheter tip terminates in the superior vena cava. Adequate opacification of the pulmonary arterial system. Motion artifact limits  evaluation. No filling defect identified to suggest acute pulmonary embolus. Mediastinum/Nodes: No enlarged axillary, mediastinal or hilar lymphadenopathy. Esophagus is normal in appearance. Lungs/Pleura: Central airways are patent. Endobronchial material within the mid and peripheral right lower lobe bronchi which may represent aspiration. Focal consolidation surrounding the peripheral right lower lobe bronchi. Large layering left pleural effusion. There is consolidation involving the left lower lobe. 6 mm left upper lobe nodule (image 37; series 9), in the location of a prior cyst. 7 mm cystic change right upper lobe peripherally (image 59; series 9) in the area of a prior pulmonary nodule. Previously described right lower lobe nodule is significantly decreased in size measuring 8 mm (image 107; series 8), previously 13 mm. Upper Abdomen: Reflux of contrast into the hepatic veins. No acute process. Musculoskeletal: No aggressive or acute appearing osseous lesions. Review of the MIP images confirms the above findings. IMPRESSION: 1. No evidence of acute pulmonary embolus. 2. Interval decrease in size of many of the previously described pulmonary nodules, likely secondary to improving infectious/inflammatory process. 3. Endobronchial material within the peripheral right lower lobe bronchi with adjacent consolidation which may represent aspiration. 4. Interval increase in size of large left pleural effusion and left basilar atelectasis. 5. Reflux of contrast into the hepatic veins raising the possibility of right heart insufficiency. Electronically Signed   By: Lovey Newcomer M.D.   On: 10/15/2017 12:24   Mr Hip Right W Wo Contrast  Result Date: 10/02/2017 CLINICAL DATA:  Bilateral hip and back pain.  Left worse than right. EXAM: MRI OF THE LEFT HIP WITHOUT AND WITH CONTRAST TECHNIQUE: Multiplanar, multisequence MR imaging was performed both before and after administration of intravenous contrast. CONTRAST:  51mL  MULTIHANCE GADOBENATE DIMEGLUMINE 529 MG/ML IV SOLN COMPARISON:  None. FINDINGS: Bones: No hip fracture, dislocation or avascular necrosis. Marrow edema in the left femoral head and acetabulum. Mild marrow edema in the anterior right acetabulum. Bilateral pericapsular edema and enhancement. No  periosteal reaction or bone destruction. No aggressive osseous lesion. Normal sacrum and sacroiliac joints. No SI joint widening or erosive changes. Articular cartilage and labrum Articular cartilage: Partial-thickness bilateral femoral heads and acetabulum, left greater than right. Small femoral inferior marginal osteophytes. Labrum: Grossly intact, but evaluation is limited by lack of intraarticular fluid. Joint or bursal effusion Joint effusion: Large left hip joint effusion with severe synovitis and enhancement. Small right hip joint effusion with synovitis and enhancement. No SI joint effusion. Bursae:  No bursa formation. Muscles and tendons Muscle edema in the left obturator internus muscle with a 13 mm fluid collection posteriorly within the muscle concerning for a small abscess. Muscle edema and mild enhancement in the adductor musculature bilaterally and right iliacus muscle. Muscle edema and enhancement of bilateral multifidus muscles. Other findings Miscellaneous: No pelvic free fluid. No fluid collection or hematoma. No inguinal lymphadenopathy. No inguinal hernia. IMPRESSION: 1. Large left hip joint effusion with enhancing synovitis, pericapsular edema and severe marrow edema on either side of the joint most concerning for septic arthritis. 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 2. Small right hip joint effusion with enhancing synovitis and pericapsular edema and enhancement most concerning for septic arthritis. 3. Muscle edema within the adductor musculature bilaterally and multifidus muscles bilaterally most concerning for infectious myositis. 13 mm fluid collection in the posterior  aspect of the left obturator internus muscle most concerning for a small abscess. Electronically Signed   By: Kathreen Devoid   On: 10/02/2017 11:36   Mr Hip Left W Wo Contrast  Result Date: 10/02/2017 CLINICAL DATA:  Bilateral hip and back pain.  Left worse than right. EXAM: MRI OF THE LEFT HIP WITHOUT AND WITH CONTRAST TECHNIQUE: Multiplanar, multisequence MR imaging was performed both before and after administration of intravenous contrast. CONTRAST:  76mL MULTIHANCE GADOBENATE DIMEGLUMINE 529 MG/ML IV SOLN COMPARISON:  None. FINDINGS: Bones: No hip fracture, dislocation or avascular necrosis. Marrow edema in the left femoral head and acetabulum. Mild marrow edema in the anterior right acetabulum. Bilateral pericapsular edema and enhancement. No periosteal reaction or bone destruction. No aggressive osseous lesion. Normal sacrum and sacroiliac joints. No SI joint widening or erosive changes. Articular cartilage and labrum Articular cartilage: Partial-thickness bilateral femoral heads and acetabulum, left greater than right. Small femoral inferior marginal osteophytes. Labrum: Grossly intact, but evaluation is limited by lack of intraarticular fluid. Joint or bursal effusion Joint effusion: Large left hip joint effusion with severe synovitis and enhancement. Small right hip joint effusion with synovitis and enhancement. No SI joint effusion. Bursae:  No bursa formation. Muscles and tendons Muscle edema in the left obturator internus muscle with a 13 mm fluid collection posteriorly within the muscle concerning for a small abscess. Muscle edema and mild enhancement in the adductor musculature bilaterally and right iliacus muscle. Muscle edema and enhancement of bilateral multifidus muscles. Other findings Miscellaneous: No pelvic free fluid. No fluid collection or hematoma. No inguinal lymphadenopathy. No inguinal hernia. IMPRESSION: 1. Large left hip joint effusion with enhancing synovitis, pericapsular edema and  severe marrow edema on either side of the joint most concerning for septic arthritis. 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 2. Small right hip joint effusion with enhancing synovitis and pericapsular edema and enhancement most concerning for septic arthritis. 3. Muscle edema within the adductor musculature bilaterally and multifidus muscles bilaterally most concerning for infectious myositis. 13 mm fluid collection in the posterior aspect of the left obturator internus muscle most concerning for a  small abscess. Electronically Signed   By: Kathreen Devoid   On: 10/02/2017 11:36   Dg Chest Port 1 View  Result Date: 10/06/2017 CLINICAL DATA:  Fever and left upper chest pain for 1 week. EXAM: PORTABLE CHEST 1 VIEW COMPARISON:  Chest CTA 09/25/2017 FINDINGS: A right PICC terminates over the high right atrium. The cardiac silhouette is normal in size. Veiling opacity in the left mid and lower hemithorax is consistent with a persistent, small to moderate-sized pleural effusion. Mild parenchymal opacity in the left lung base likely represents atelectasis. No right lung consolidation is seen. Nodular right lung opacities on the prior CT are not clearly apparent on this radiograph. No pneumothorax is identified. No acute osseous abnormality is seen. IMPRESSION: Left pleural effusion and mild left basilar atelectasis. Electronically Signed   By: Logan Bores M.D.   On: 10/06/2017 11:12   Dg Hips Bilat With Pelvis 2v  Result Date: 10/01/2017 CLINICAL DATA:  Hip pain. EXAM: DG HIP (WITH OR WITHOUT PELVIS) 2V BILAT COMPARISON:  None. FINDINGS: There are degenerative changes in the left hip with an osteophyte off the femoral neck. No significant loss of joint space. No fractures or dislocations identified. No other acute abnormalities. 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  No other abnormalities. Electronically Signed   By: Dorise Bullion III M.D   On: 10/01/2017 12:46    Lab  Data:  CBC: Recent Labs  Lab 10/20/17 0426 10/23/17 0342 10/26/17 0448  WBC 12.5* 8.6 11.1*  HGB 8.2* 8.0* 8.8*  HCT 25.6* 25.7* 28.1*  MCV 91.4 90.8 90.9  PLT 288 289 782   Basic Metabolic Panel: Recent Labs  Lab 10/20/17 0426 10/23/17 0342 10/26/17 0448  NA 130* 134* 134*  K 4.1 4.2 4.2  CL 93* 96* 97*  CO2 26 28 27   GLUCOSE 119* 111* 97  BUN 9 8 5*  CREATININE 0.58* 0.56* 0.56*  CALCIUM 8.1* 8.3* 8.7*   GFR: Estimated Creatinine Clearance: 115.8 mL/min (A) (by C-G formula based on SCr of 0.56 mg/dL (L)). Liver Function Tests: No results for input(s): AST, ALT, ALKPHOS, BILITOT, PROT, ALBUMIN in the last 168 hours. No results for input(s): LIPASE, AMYLASE in the last 168 hours. No results for input(s): AMMONIA in the last 168 hours. Coagulation Profile: No results for input(s): INR, PROTIME in the last 168 hours. Cardiac Enzymes: No results for input(s): CKTOTAL, CKMB, CKMBINDEX, TROPONINI in the last 168 hours. BNP (last 3 results) No results for input(s): PROBNP in the last 8760 hours. HbA1C: No results for input(s): HGBA1C in the last 72 hours. CBG: No results for input(s): GLUCAP in the last 168 hours. Lipid Profile: No results for input(s): CHOL, HDL, LDLCALC, TRIG, CHOLHDL, LDLDIRECT in the last 72 hours. Thyroid Function Tests: No results for input(s): TSH, T4TOTAL, FREET4, T3FREE, THYROIDAB in the last 72 hours. Anemia Panel: No results for input(s): VITAMINB12, FOLATE, FERRITIN, TIBC, IRON, RETICCTPCT in the last 72 hours. Urine analysis:    Component Value Date/Time   COLORURINE YELLOW 10/06/2017 [place] 10/06/2017 1746   LABSPEC 1.017 10/06/2017 1746   PHURINE 7.0 10/06/2017 1746   GLUCOSEU NEGATIVE 10/06/2017 1746   HGBUR MODERATE (A) 10/06/2017 1746   BILIRUBINUR NEGATIVE 10/06/2017 1746   KETONESUR NEGATIVE 10/06/2017 1746   PROTEINUR NEGATIVE 10/06/2017 1746   UROBILINOGEN 0.2 05/16/2012 0847   NITRITE NEGATIVE  10/06/2017 1746   LEUKOCYTESUR NEGATIVE 10/06/2017 1746     Topacio Cella M.D. [facility] 10/26/2017, 2:46  PM  Pager: [phone] Between 7am to 7pm - call Pager - [phone]  After 7pm go to [email or web address] - password [email or web address]  Call night coverage person covering after 7pm

## 2017-10-27 NOTE — Progress Notes (Signed)
[facility]                                                                              Gary Hicks Demographics  Gary Hicks, is a 32 y.o. male, DOB - 1986/03/05, EQA:[id number]  Admit date - 09/11/2017   Admitting Physician Quintella Baton, MD  Outpatient Primary MD for the Gary Hicks is Gary Hicks, [name] Per  Outpatient specialists:   LOS - 46  days   Medical records reviewed and are as summarized below:    Chief Complaint  Gary Hicks presents with  . Weakness  . Leg Pain       Brief summary   32 year old male, active IVDA, who presents with generalized pain. Gary Hicks was incarcerated and released 3-4 weeks ago. Gary Hicks admits using IV methamphetamine as well as heroin in the past 24 hours prior to admission. Reports global polyarthralgias including his bilateral feet, bilateral knees, bilateral hips and shoulders. Also reports fevers and chills generalized myalgias. Pt admitted for further management. 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 Pt was also diagnosed with DVT Right gastrocnemius vein . He was started on Lovenox 2/18.He was also diagnosed with left knee septic infection. He underwent bursectomy and I and D by Dr Percell Miller 2-25.He reported worsening left hip pain 3-02, MRI showed, B/L hipSeptic arthritis and 13 mm fluid collection in the posterior aspect of the left obturator internus muscle most concerning for a small abscess.IR was consulted for arthrocentesis, Gary Hicks couldn't tolerate procedure. Dr Percell Miller performed I and D of bilateral hip on 3/5.  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  Blood cultures 2/11 grew MSSA, repeat on 2/15 and 3/7 have been negative -2D echo showed large 1.1 cmx 1.8 cm multilobulated highly mobile vegetation on the right ventricular aspect of the  anterior leaflet of the tricuspid valve.  Moderate to severe tricuspid regurgitation. -ID was consulted, recommended continue IV Ancef for 6 weeks from 2/23 till 11/05/17 -Ancef changed to cefepime on 3/21 due to new fevers/HCAP, completed on 3/26.  ID was reconsulted and suspected septic embolization from endocarditis causing pleuritic right-sided chest pain dyspnea, cough, possible H CAP.  Gary Hicks also has left-sided pleural effusion that he declined to do thoracentesis. -History of IV drug use, currently on Suboxone, difficult placement hence Gary Hicks will stay inpatient for the duration of antibiotics -CTA chest negative for PE, no chest pain currently No acute issues or complaints-    Septic left knee, bilateral hip joints -MRI of the left knee showed 4.8 x0.8x 4 cm fluid collection in the subcutaneous fat anterior to patella tendon with severe surrounding soft tissue edema concerning for septic arthritis. -MRI hip showed septic arthritis and 13 mm fluid collection in the posterior aspect of the left obturator internus muscle concerning for small abscess -Orthopedics was consulted (Dr Percell Miller).  Status post I&D and bursectomy of the left knee prepatellar bursitis on 09/23/17, status post I&D of septic arthritis bilateral hip on 10/04/17 -Cultures intraoperatively negative -MRI brain negative for abscess, Doppler of the  left arm negative for DVT.   Left-sided pleural effusion -Seen on CT scan, moderate, Gary Hicks has decided not to pursue thoracentesis at all. - Gary Hicks has repeatedly declined thoracentesis.  DVT right gastrocnemius vein with superficial saphenous thrombophlebitis -Continue Eliquis.  History of IV drug use -Dr. Damita Dunnings managing Suboxone twice daily -No high-dose IV benzos unless required for seizures, no extra narcotics  Malnutrition -Continue nutritional supplements, dietitian was consulted  Generalized anxiety disorder -Psych was consulted, continue Zoloft 50 mg  daily -May continue Klonopin as needed for 2 weeks and then wean off, not advised for long-term. -Psych was reconsulted for follow-up, recommended increasing the Zoloft and tapering off the Klonopin  Seizures -Likely due to drug use, continue seizure precautions, as needed Ativan  -MRI brain negative for any abscess  Iron deficiency anemia -H&H stable, continue iron supplementation  Thrombocytopenia -Resolved  Hyponatremia, hypokalemia -Resolved  Hepatitis C antibody positive -ID following  Vitamin D deficiency - Continue supplementation  Constipation -Resolved, continue Senokot S, MiraLAX   Code Status: full  DVT Prophylaxis: Apixaban Family Communication: Discussed in detail with the Gary Hicks, all imaging results, lab results explained to the Gary Hicks    Disposition Plan: Will stay inpatient for the duration of IV antibiotics, end date 11/05/17, unless change recommended by ID.  No acute complaints    Time Spent in minutes 15 minutes  Procedures:  I&D of left knee on 09/23/17 I&D of bilateral hip on 10/04/17  Consultants:   Infectious disease Orthopedics  Antimicrobials:   IV Ancef till 11/05/17  Cefepime to complete on 3/26 today   Medications  Scheduled Meds: . apixaban  5 mg Oral BID  . buprenorphine-naloxone  1 tablet Sublingual BID  . famotidine  20 mg Oral Daily  . feeding supplement (ENSURE ENLIVE)  237 mL Oral TID BM  . ferrous sulfate  325 mg Oral BID WC  . hydrocortisone cream   Topical BID  . multivitamin with minerals  1 tablet Oral Daily  . nicotine  14 mg Transdermal Daily  . polyethylene glycol  17 g Oral BID  . senna-docusate  1 tablet Oral BID  . sertraline  50 mg Oral Daily  . sodium chloride flush  10-40 mL Intracatheter Q12H  . Vitamin D (Ergocalciferol)  50,000 Units Oral Q Mon   Continuous Infusions: .  ceFAZolin (ANCEF) IV 2 g (10/27/17 1334)  . lactated ringers 10 mL/hr at 09/23/17 1329  . lactated ringers 10 mL/hr at  10/04/17 1237   PRN Meds:.acetaminophen, alum & mag hydroxide-simeth, clonazePAM, hydrOXYzine, ondansetron **OR** ondansetron (ZOFRAN) IV, oxyCODONE, sodium chloride, sodium chloride flush, sodium chloride flush, traZODone   Antibiotics   Anti-infectives (From admission, onward)   Start     Dose/Rate Route Frequency Ordered Stop   10/26/17 0600  ceFAZolin (ANCEF) IVPB 2g/100 mL premix     2 g 200 mL/hr over 30 Minutes Intravenous Every 8 hours 10/21/17 1123 11/06/17 0559   10/20/17 1600  ceFEPIme (MAXIPIME) 2 g in sodium chloride 0.9 % 100 mL IVPB     2 g 200 mL/hr over 30 Minutes Intravenous Every 8 hours 10/20/17 1517 10/25/17 1800   09/23/17 0600  ceFAZolin (ANCEF) IVPB 2g/100 mL premix     2 g 200 mL/hr over 30 Minutes Intravenous To Surgery 09/22/17 1956 09/23/17 1436   09/23/17 0600  ceFAZolin (ANCEF) IVPB 2g/100 mL premix  Status:  Discontinued     2 g 200 mL/hr over 30 Minutes Intravenous On call to O.R. 09/22/17 1956 09/22/17  2002   09/15/17 1500  Oritavancin Diphosphate (ORBACTIV) 1,200 mg in dextrose 5 % IVPB     1,200 mg 333.3 mL/hr over 180 Minutes Intravenous Once 09/15/17 1357 09/15/17 2100   09/12/17 1400  ceFAZolin (ANCEF) IVPB 2g/100 mL premix  Status:  Discontinued     2 g 200 mL/hr over 30 Minutes Intravenous Every 8 hours 09/12/17 1300 10/20/17 1517   09/11/17 2100  vancomycin (VANCOCIN) IVPB 750 mg/150 ml premix  Status:  Discontinued     750 mg 150 mL/hr over 60 Minutes Intravenous Every 8 hours 09/11/17 2015 09/12/17 1300        Subjective:   Corene Cornea Cronce was seen and examined today.  At the time of encounter, Gary Hicks was drawing in his schedule.  Denies any specific complaints. No fevers.  Gary Hicks denies dizziness, abdominal pain, N/V/D/C, new weakness, numbess, tingling. No acute events overnight.    Objective:   Vitals:   10/26/17 1619 10/26/17 2101 10/27/17 0547 10/27/17 0825  BP: (!) 87/63 133/85 99/70 105/74  Pulse: 95 (!) 106 (!) 101 (!) 102   Resp: 18 18 18 18   Temp: 98.2 F (36.8 C) 98.5 F (36.9 C) 98.3 F (36.8 C) 98.1 F (36.7 C)  TempSrc: Oral Oral Oral Oral  SpO2: 94% 99% 100% 96%  Weight:      Height:        Intake/Output Summary (Last 24 hours) at 10/27/2017 1438 Last data filed at 10/27/2017 0200 Gross per 24 hour  Intake 320 ml  Output 0 ml  Net 320 ml     Wt Readings from Last 3 Encounters:  10/24/17 61.2 kg (134 lb 14.7 oz)  09/10/17 63.5 kg (140 lb)  01/05/15 63.5 kg (140 lb)     Exam   General: Alert and oriented x 3, NAD  Eyes:   HEENT:    Cardiovascular: S1 S2 clear. RRR. No pedal edema b/l  Respiratory: Clear to auscultation bilaterally, no wheezing, rales or rhonchi  Gastrointestinal: Soft, nontender, nondistended, + bowel sounds  Ext: no pedal edema bilaterally  Neuro: no new deficits  Musculoskeletal: No digital cyanosis, clubbing  Skin: Tattoos  Psych: Normal affect and demeanor, alert and oriented x3    Data Reviewed:  I have personally reviewed following labs and imaging studies  Micro Results Recent Results (from the past 240 hour(s))  Culture, blood (routine x 2)     Status: None   Collection Time: 10/19/17  2:06 PM  Result Value Ref Range Status   Specimen Description BLOOD BLOOD LEFT WRIST  Final   Special Requests   Final    BOTTLES DRAWN AEROBIC ONLY Blood Culture results may not be optimal due to an inadequate volume of blood received in culture bottles   Culture   Final    NO GROWTH 5 DAYS Performed at [facility], [place] [address], [place], [place] [address]    Report Status 10/24/2017 FINAL  Final    Radiology Reports Dg Chest 2 View  Result Date: 10/19/2017 CLINICAL DATA:  New onset of fever. Recent episodes of chest pain. History of endocarditis, substance abuse, current smoker. EXAM: CHEST - 2 VIEW COMPARISON:  Chest x-ray and chest CT scan of October 13, 2017 FINDINGS: The lungs are adequately inflated. There is new airspace opacity in the  right lower lung. There is persistent increased density in the left mid and lower lung with obscuration of the left hemidiaphragm. The heart is top-normal in size. The pulmonary vascularity is  normal. The right PICC line tip projects over the distal third of the SVC. IMPRESSION: Persistent left lower lobe atelectasis or pneumonia with moderate-sized left pleural effusion. New infiltrate in the right lower lobe compatible with pneumonia. Electronically Signed   By: David  Martinique M.D.   On: 10/19/2017 13:32   Dg Chest 2 View  Result Date: 10-27-2017 CLINICAL DATA:  Chest pain. EXAM: CHEST - 2 VIEW COMPARISON:  Radiograph of October 06, 2017. FINDINGS: The heart size and mediastinal contours are within normal limits. No pneumothorax is noted. Right lung is clear. Right-sided PICC line is unchanged in position. Stable large left pleural effusion is noted with probable underlying atelectasis or infiltrate. The visualized skeletal structures are unremarkable. IMPRESSION: Stable large left pleural effusion is noted with probable underlying atelectasis or infiltrate. Electronically Signed   By: Marijo Conception, M.D.   On: 003-28-2019 15:53   Ct Angio Chest Pe W Or Wo Contrast  Result Date: 10/15/2017 CLINICAL DATA:  Evaluate for pulmonary embolus. Shortness of breath. EXAM: CT ANGIOGRAPHY CHEST WITH CONTRAST TECHNIQUE: Multidetector CT imaging of the chest was performed using the standard protocol during bolus administration of intravenous contrast. Multiplanar CT image reconstructions and MIPs were obtained to evaluate the vascular anatomy. CONTRAST:  113mL ISOVUE-370 IOPAMIDOL (ISOVUE-370) INJECTION 76% COMPARISON:  Chest CT 09/25/2017. FINDINGS: Cardiovascular: Normal heart size. No pericardial effusion. Normal caliber thoracic aorta. Central venous catheter tip terminates in the superior vena cava. Adequate opacification of the pulmonary arterial system. Motion artifact limits evaluation. No filling defect  identified to suggest acute pulmonary embolus. Mediastinum/Nodes: No enlarged axillary, mediastinal or hilar lymphadenopathy. Esophagus is normal in appearance. Lungs/Pleura: Central airways are patent. Endobronchial material within the mid and peripheral right lower lobe bronchi which may represent aspiration. Focal consolidation surrounding the peripheral right lower lobe bronchi. Large layering left pleural effusion. There is consolidation involving the left lower lobe. 6 mm left upper lobe nodule (image 37; series 9), in the location of a prior cyst. 7 mm cystic change right upper lobe peripherally (image 59; series 9) in the area of a prior pulmonary nodule. Previously described right lower lobe nodule is significantly decreased in size measuring 8 mm (image 107; series 8), previously 13 mm. Upper Abdomen: Reflux of contrast into the hepatic veins. No acute process. Musculoskeletal: No aggressive or acute appearing osseous lesions. Review of the MIP images confirms the above findings. IMPRESSION: 1. No evidence of acute pulmonary embolus. 2. Interval decrease in size of many of the previously described pulmonary nodules, likely secondary to improving infectious/inflammatory process. 3. Endobronchial material within the peripheral right lower lobe bronchi with adjacent consolidation which may represent aspiration. 4. Interval increase in size of large left pleural effusion and left basilar atelectasis. 5. Reflux of contrast into the hepatic veins raising the possibility of right heart insufficiency. Electronically Signed   By: Lovey Newcomer M.D.   On: 10/15/2017 12:24   Mr Hip Right W Wo Contrast  Result Date: 10/02/2017 CLINICAL DATA:  Bilateral hip and back pain.  Left worse than right. EXAM: MRI OF THE LEFT HIP WITHOUT AND WITH CONTRAST TECHNIQUE: Multiplanar, multisequence MR imaging was performed both before and after administration of intravenous contrast. CONTRAST:  5mL MULTIHANCE GADOBENATE DIMEGLUMINE  529 MG/ML IV SOLN COMPARISON:  None. FINDINGS: Bones: No hip fracture, dislocation or avascular necrosis. Marrow edema in the left femoral head and acetabulum. Mild marrow edema in the anterior right acetabulum. Bilateral pericapsular edema and enhancement. No periosteal reaction or bone destruction.  No aggressive osseous lesion. Normal sacrum and sacroiliac joints. No SI joint widening or erosive changes. Articular cartilage and labrum Articular cartilage: Partial-thickness bilateral femoral heads and acetabulum, left greater than right. Small femoral inferior marginal osteophytes. Labrum: Grossly intact, but evaluation is limited by lack of intraarticular fluid. Joint or bursal effusion Joint effusion: Large left hip joint effusion with severe synovitis and enhancement. Small right hip joint effusion with synovitis and enhancement. No SI joint effusion. Bursae:  No bursa formation. Muscles and tendons Muscle edema in the left obturator internus muscle with a 13 mm fluid collection posteriorly within the muscle concerning for a small abscess. Muscle edema and mild enhancement in the adductor musculature bilaterally and right iliacus muscle. Muscle edema and enhancement of bilateral multifidus muscles. Other findings Miscellaneous: No pelvic free fluid. No fluid collection or hematoma. No inguinal lymphadenopathy. No inguinal hernia. IMPRESSION: 1. Large left hip joint effusion with enhancing synovitis, pericapsular edema and severe marrow edema on either side of the joint most concerning for septic arthritis. 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 2. Small right hip joint effusion with enhancing synovitis and pericapsular edema and enhancement most concerning for septic arthritis. 3. Muscle edema within the adductor musculature bilaterally and multifidus muscles bilaterally most concerning for infectious myositis. 13 mm fluid collection in the posterior aspect of the left obturator  internus muscle most concerning for a small abscess. Electronically Signed   By: Kathreen Devoid   On: 10/02/2017 11:36   Mr Hip Left W Wo Contrast  Result Date: 10/02/2017 CLINICAL DATA:  Bilateral hip and back pain.  Left worse than right. EXAM: MRI OF THE LEFT HIP WITHOUT AND WITH CONTRAST TECHNIQUE: Multiplanar, multisequence MR imaging was performed both before and after administration of intravenous contrast. CONTRAST:  83mL MULTIHANCE GADOBENATE DIMEGLUMINE 529 MG/ML IV SOLN COMPARISON:  None. FINDINGS: Bones: No hip fracture, dislocation or avascular necrosis. Marrow edema in the left femoral head and acetabulum. Mild marrow edema in the anterior right acetabulum. Bilateral pericapsular edema and enhancement. No periosteal reaction or bone destruction. No aggressive osseous lesion. Normal sacrum and sacroiliac joints. No SI joint widening or erosive changes. Articular cartilage and labrum Articular cartilage: Partial-thickness bilateral femoral heads and acetabulum, left greater than right. Small femoral inferior marginal osteophytes. Labrum: Grossly intact, but evaluation is limited by lack of intraarticular fluid. Joint or bursal effusion Joint effusion: Large left hip joint effusion with severe synovitis and enhancement. Small right hip joint effusion with synovitis and enhancement. No SI joint effusion. Bursae:  No bursa formation. Muscles and tendons Muscle edema in the left obturator internus muscle with a 13 mm fluid collection posteriorly within the muscle concerning for a small abscess. Muscle edema and mild enhancement in the adductor musculature bilaterally and right iliacus muscle. Muscle edema and enhancement of bilateral multifidus muscles. Other findings Miscellaneous: No pelvic free fluid. No fluid collection or hematoma. No inguinal lymphadenopathy. No inguinal hernia. IMPRESSION: 1. Large left hip joint effusion with enhancing synovitis, pericapsular edema and severe marrow edema on either  side of the joint most concerning for septic arthritis. 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 2. Small right hip joint effusion with enhancing synovitis and pericapsular edema and enhancement most concerning for septic arthritis. 3. Muscle edema within the adductor musculature bilaterally and multifidus muscles bilaterally most concerning for infectious myositis. 13 mm fluid collection in the posterior aspect of the left obturator internus muscle most concerning for a small abscess. Electronically Signed  By: Kathreen Devoid   On: 10/02/2017 11:36   Dg Chest Port 1 View  Result Date: 10/06/2017 CLINICAL DATA:  Fever and left upper chest pain for 1 week. EXAM: PORTABLE CHEST 1 VIEW COMPARISON:  Chest CTA 09/25/2017 FINDINGS: A right PICC terminates over the high right atrium. The cardiac silhouette is normal in size. Veiling opacity in the left mid and lower hemithorax is consistent with a persistent, small to moderate-sized pleural effusion. Mild parenchymal opacity in the left lung base likely represents atelectasis. No right lung consolidation is seen. Nodular right lung opacities on the prior CT are not clearly apparent on this radiograph. No pneumothorax is identified. No acute osseous abnormality is seen. IMPRESSION: Left pleural effusion and mild left basilar atelectasis. Electronically Signed   By: Logan Bores M.D.   On: 10/06/2017 11:12   Dg Hips Bilat With Pelvis 2v  Result Date: 10/01/2017 CLINICAL DATA:  Hip pain. EXAM: DG HIP (WITH OR WITHOUT PELVIS) 2V BILAT COMPARISON:  None. FINDINGS: There are degenerative changes in the left hip with an osteophyte off the femoral neck. No significant loss of joint space. No fractures or dislocations identified. No other acute abnormalities. 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  No other abnormalities. Electronically Signed   By: Dorise Bullion III M.D   On: 10/01/2017 12:46    Lab Data:  CBC: Recent Labs   Lab 10/23/17 0342 10/26/17 0448  WBC 8.6 11.1*  HGB 8.0* 8.8*  HCT 25.7* 28.1*  MCV 90.8 90.9  PLT 289 831   Basic Metabolic Panel: Recent Labs  Lab 10/23/17 0342 10/26/17 0448  NA 134* 134*  K 4.2 4.2  CL 96* 97*  CO2 28 27  GLUCOSE 111* 97  BUN 8 5*  CREATININE 0.56* 0.56*  CALCIUM 8.3* 8.7*   GFR: Estimated Creatinine Clearance: 115.8 mL/min (A) (by C-G formula based on SCr of 0.56 mg/dL (L)). Liver Function Tests: No results for input(s): AST, ALT, ALKPHOS, BILITOT, PROT, ALBUMIN in the last 168 hours. No results for input(s): LIPASE, AMYLASE in the last 168 hours. No results for input(s): AMMONIA in the last 168 hours. Coagulation Profile: No results for input(s): INR, PROTIME in the last 168 hours. Cardiac Enzymes: No results for input(s): CKTOTAL, CKMB, CKMBINDEX, TROPONINI in the last 168 hours. BNP (last 3 results) No results for input(s): PROBNP in the last 8760 hours. HbA1C: No results for input(s): HGBA1C in the last 72 hours. CBG: No results for input(s): GLUCAP in the last 168 hours. Lipid Profile: No results for input(s): CHOL, HDL, LDLCALC, TRIG, CHOLHDL, LDLDIRECT in the last 72 hours. Thyroid Function Tests: No results for input(s): TSH, T4TOTAL, FREET4, T3FREE, THYROIDAB in the last 72 hours. Anemia Panel: No results for input(s): VITAMINB12, FOLATE, FERRITIN, TIBC, IRON, RETICCTPCT in the last 72 hours. Urine analysis:    Component Value Date/Time   COLORURINE YELLOW 10/06/2017 [place] 10/06/2017 1746   LABSPEC 1.017 10/06/2017 1746   PHURINE 7.0 10/06/2017 1746   GLUCOSEU NEGATIVE 10/06/2017 1746   HGBUR MODERATE (A) 10/06/2017 1746   BILIRUBINUR NEGATIVE 10/06/2017 1746   KETONESUR NEGATIVE 10/06/2017 1746   PROTEINUR NEGATIVE 10/06/2017 1746   UROBILINOGEN 0.2 05/16/2012 0847   NITRITE NEGATIVE 10/06/2017 1746   LEUKOCYTESUR NEGATIVE 10/06/2017 1746     Gennell How M.D. [facility] 10/27/2017, 2:38  PM  Pager: [phone] Between 7am to 7pm - call Pager - [phone]  After 7pm go to [email or web address] - password [facility]  Call night coverage person covering after 7pm

## 2017-10-27 NOTE — Care Management Note (Addendum)
Case Management Note  Patient Details  Name: Gary Hicks MRN: [id number] Date of Birth: 1985-10-30  Subjective/Objective:   [name] provider              Action/Plan: NCM called [facility] at [phone], representative with scheduling states they are not taking new patients at this time due recent loss of (2) providers.  Call placed to [place] at [phone] and scheduled new patient appointment to establish PCP if needed.    Expected Discharge Date:   11/06/2017           Expected Discharge Plan:  Home/Self Care  In-House Referral:  Nutrition  Discharge planning Services  CM Consult  Status of Service:  In process, will continue to follow  Additional Comments:  Kristen Cardinal, RN  Nurse Case Manager [place] 10/27/2017, 9:02 AM

## 2017-10-27 NOTE — Progress Notes (Signed)
PT Cancellation Note  Patient Details Name: Gary Hicks MRN: [id number] DOB: June 30, 1986   Cancelled Treatment:    Reason Eval/Treat Not Completed: Patient declined, no reason specified. Pt declining any mobility at this time. PT will continue to f/u with pt as available.    [place] 10/27/2017, 4:17 PM

## 2017-10-28 NOTE — Progress Notes (Signed)
PT Cancellation Note  Patient Details Name: Yohann Curl Cudney MRN: [id number] DOB: 1986/01/01   Cancelled Treatment:    Reason Eval/Treat Not Completed: Patient declined, no reason specified.  "no, I'm not in the mood."  Will try back again as able. 10/28/2017  Donnella Sham, [place] [phone]  (pager)   Tessie Fass Abraham [place] 10/28/2017, 11:07 AM

## 2017-10-28 NOTE — Progress Notes (Signed)
Patient was off of the unit for about 25 min with RN supervision.  No issues.

## 2017-10-28 NOTE — Progress Notes (Signed)
[facility]                                                                              Patient Demographics  Gary Hicks, is a 32 y.o. male, DOB - 1985/09/05, PPJ:[id number]  Admit date - 09/11/2017   Admitting Physician Quintella Baton, MD  Outpatient Primary MD for the patient is Patient, [name] Per  Outpatient specialists:   LOS - 47  days   Medical records reviewed and are as summarized below:    Chief Complaint  Patient presents with  . Weakness  . Leg Pain       Brief summary   32 year old male, active IVDA, who presents with generalized pain. Patient was incarcerated and released 3-4 weeks ago. Patient admits using IV methamphetamine as well as heroin in the past 24 hours prior to admission. Reports global polyarthralgias including his bilateral feet, bilateral knees, bilateral hips and shoulders. Also reports fevers and chills generalized myalgias. Pt admitted for further management. 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 Pt was also diagnosed with DVT Right gastrocnemius vein . He was started on Lovenox 2/18.He was also diagnosed with left knee septic infection. He underwent bursectomy and I and D by Dr Percell Miller 2-25.He reported worsening left hip pain 3-02, MRI showed, B/L hipSeptic arthritis and 13 mm fluid collection in the posterior aspect of the left obturator internus muscle most concerning for a small abscess.IR was consulted for arthrocentesis, patient couldn't tolerate procedure. Dr Percell Miller performed I and D of bilateral hip on 3/5.  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  Blood cultures 2/11 grew MSSA, repeat on 2/15 and 3/7 have been negative -2D echo showed large 1.1 cmx 1.8 cm multilobulated highly mobile vegetation on the right ventricular aspect of the  anterior leaflet of the tricuspid valve.  Moderate to severe tricuspid regurgitation. -ID was consulted, recommended continue IV Ancef for 6 weeks from 2/23 till 11/05/17 -Ancef changed to cefepime on 3/21 due to new fevers/HCAP, completed on 3/26.  ID was reconsulted and suspected septic embolization from endocarditis causing pleuritic right-sided chest pain dyspnea, cough, possible H CAP.  Patient also has left-sided pleural effusion that he declined to do thoracentesis. -History of IV drug use, currently on Suboxone, difficult placement hence patient will stay inpatient for the duration of antibiotics -CTA chest negative for PE, no chest pain currently -No complaints   Septic left knee, bilateral hip joints -MRI of the left knee showed 4.8 x0.8x 4 cm fluid collection in the subcutaneous fat anterior to patella tendon with severe surrounding soft tissue edema concerning for septic arthritis. -MRI hip showed septic arthritis and 13 mm fluid collection in the posterior aspect of the left obturator internus muscle concerning for small abscess -Orthopedics was consulted (Dr Percell Miller).  Status post I&D and bursectomy of the left knee prepatellar bursitis on 09/23/17, status post I&D of septic arthritis bilateral hip on 10/04/17 -Cultures intraoperatively negative -MRI brain negative for abscess, Doppler of the left arm negative for  DVT.   Left-sided pleural effusion -Seen on CT scan, moderate, patient has decided not to pursue thoracentesis at all. - Patient has repeatedly declined thoracentesis.  DVT right gastrocnemius vein with superficial saphenous thrombophlebitis -Continue Eliquis.  History of IV drug use -Dr. Damita Dunnings managing Suboxone twice daily -No high-dose IV benzos unless required for seizures, no extra narcotics  Malnutrition -Continue nutritional supplements, dietitian was consulted  Generalized anxiety disorder -Psych was consulted, continue Zoloft 50 mg daily -May continue  Klonopin as needed for 2 weeks and then wean off, not advised for long-term. -Psych was reconsulted for follow-up, recommended increasing the Zoloft and tapering off the Klonopin  Seizures -Likely due to drug use, continue seizure precautions, as needed Ativan  -MRI brain negative for any abscess  Iron deficiency anemia -H&H stable, continue iron supplementation  Thrombocytopenia -Resolved  Hyponatremia, hypokalemia -Resolved  Hepatitis C antibody positive -ID following  Vitamin D deficiency - Continue supplementation  Constipation -Resolved, continue Senokot S, MiraLAX   Code Status: full  DVT Prophylaxis: Apixaban Family Communication: Discussed in detail with the patient, all imaging results, lab results explained to the patient    Disposition Plan: Will stay inpatient for the duration of IV antibiotics, end date 11/05/17, unless change recommended by ID.  Currently no complaints    Time Spent in minutes 15 minutes  Procedures:  I&D of left knee on 09/23/17 I&D of bilateral hip on 10/04/17  Consultants:   Infectious disease Orthopedics  Antimicrobials:   IV Ancef till 11/05/17  Cefepime to complete on 3/26 today   Medications  Scheduled Meds: . apixaban  5 mg Oral BID  . buprenorphine-naloxone  1 tablet Sublingual BID  . famotidine  20 mg Oral Daily  . feeding supplement (ENSURE ENLIVE)  237 mL Oral TID BM  . ferrous sulfate  325 mg Oral BID WC  . hydrocortisone cream   Topical BID  . multivitamin with minerals  1 tablet Oral Daily  . nicotine  14 mg Transdermal Daily  . polyethylene glycol  17 g Oral BID  . senna-docusate  1 tablet Oral BID  . sertraline  50 mg Oral Daily  . sodium chloride flush  10-40 mL Intracatheter Q12H  . Vitamin D (Ergocalciferol)  50,000 Units Oral Q Mon   Continuous Infusions: .  ceFAZolin (ANCEF) IV 2 g (10/28/17 1321)  . lactated ringers 10 mL/hr at 09/23/17 1329  . lactated ringers 10 mL/hr at 10/04/17 1237    PRN Meds:.acetaminophen, alum & mag hydroxide-simeth, clonazePAM, hydrOXYzine, ondansetron **OR** ondansetron (ZOFRAN) IV, oxyCODONE, sodium chloride, sodium chloride flush, sodium chloride flush, traZODone   Antibiotics   Anti-infectives (From admission, onward)   Start     Dose/Rate Route Frequency Ordered Stop   10/26/17 0600  ceFAZolin (ANCEF) IVPB 2g/100 mL premix     2 g 200 mL/hr over 30 Minutes Intravenous Every 8 hours 10/21/17 1123 11/06/17 0559   10/20/17 1600  ceFEPIme (MAXIPIME) 2 g in sodium chloride 0.9 % 100 mL IVPB     2 g 200 mL/hr over 30 Minutes Intravenous Every 8 hours 10/20/17 1517 10/25/17 1800   09/23/17 0600  ceFAZolin (ANCEF) IVPB 2g/100 mL premix     2 g 200 mL/hr over 30 Minutes Intravenous To Surgery 09/22/17 1956 09/23/17 1436   09/23/17 0600  ceFAZolin (ANCEF) IVPB 2g/100 mL premix  Status:  Discontinued     2 g 200 mL/hr over 30 Minutes Intravenous On call to O.R. 09/22/17 1956 09/22/17 2002   09/15/17  1500  Oritavancin Diphosphate (ORBACTIV) 1,200 mg in dextrose 5 % IVPB     1,200 mg 333.3 mL/hr over 180 Minutes Intravenous Once 09/15/17 1357 09/15/17 2100   09/12/17 1400  ceFAZolin (ANCEF) IVPB 2g/100 mL premix  Status:  Discontinued     2 g 200 mL/hr over 30 Minutes Intravenous Every 8 hours 09/12/17 1300 10/20/17 1517   09/11/17 2100  vancomycin (VANCOCIN) IVPB 750 mg/150 ml premix  Status:  Discontinued     750 mg 150 mL/hr over 60 Minutes Intravenous Every 8 hours 09/11/17 2015 09/12/17 1300        Subjective:   Gary Hicks was seen and examined today.  No complaints, sitting up in the bed, texting on the phone.  No fevers, BP soft. Patient denies dizziness, abdominal pain, N/V/D/C, new weakness, numbess, tingling. No acute events overnight.    Objective:   Vitals:   10/27/17 1742 10/27/17 2047 10/28/17 0517 10/28/17 1020  BP: 99/71 95/61 (!) 88/57 (!) 84/67  Pulse: (!) 113 (!) 115 (!) 103 (!) 104  Resp: 18 20 18 18   Temp: 98 F  (36.7 C) 98.2 F (36.8 C) 98.4 F (36.9 C) 98.1 F (36.7 C)  TempSrc: Oral Oral Oral Oral  SpO2: 95% 96% 96% 99%  Weight:  61.3 kg (135 lb 2.3 oz)    Height:        Intake/Output Summary (Last 24 hours) at 10/28/2017 1427 Last data filed at 10/28/2017 3536 Gross per 24 hour  Intake 977 ml  Output 0 ml  Net 977 ml     Wt Readings from Last 3 Encounters:  10/27/17 61.3 kg (135 lb 2.3 oz)  09/10/17 63.5 kg (140 lb)  01/05/15 63.5 kg (140 lb)     Exam   General: Alert and oriented x 3, NAD  Eyes:   HEENT:   Cardiovascular: S1-S2 clear, RRR   respiratory: CTA B  Gastrointestinal: Soft, nontender, nondistended, + bowel sounds  Ext: no pedal edema bilaterally  Neuro: not assessed  Musculoskeletal: Not assessed  Skin: Tattoos  Psych: Normal affect and demeanor, alert and oriented x3    Data Reviewed:  I have personally reviewed following labs and imaging studies  Micro Results Recent Results (from the past 240 hour(s))  Culture, blood (routine x 2)     Status: None   Collection Time: 10/19/17  2:06 PM  Result Value Ref Range Status   Specimen Description BLOOD BLOOD LEFT WRIST  Final   Special Requests   Final    BOTTLES DRAWN AEROBIC ONLY Blood Culture results may not be optimal due to an inadequate volume of blood received in culture bottles   Culture   Final    NO GROWTH 5 DAYS Performed at [facility], [place] [address], [place], [place] [address]    Report Status 10/24/2017 FINAL  Final    Radiology Reports Dg Chest 2 View  Result Date: 10/19/2017 CLINICAL DATA:  New onset of fever. Recent episodes of chest pain. History of endocarditis, substance abuse, current smoker. EXAM: CHEST - 2 VIEW COMPARISON:  Chest x-ray and chest CT scan of October 13, 2017 FINDINGS: The lungs are adequately inflated. There is new airspace opacity in the right lower lung. There is persistent increased density in the left mid and lower lung with obscuration of the left  hemidiaphragm. The heart is top-normal in size. The pulmonary vascularity is normal. The right PICC line tip projects over the distal third of the SVC. IMPRESSION:  Persistent left lower lobe atelectasis or pneumonia with moderate-sized left pleural effusion. New infiltrate in the right lower lobe compatible with pneumonia. Electronically Signed   By: David  Martinique M.D.   On: 10/19/2017 13:32   Dg Chest 2 View  Result Date: Apr 15, 202019 CLINICAL DATA:  Chest pain. EXAM: CHEST - 2 VIEW COMPARISON:  Radiograph of October 06, 2017. FINDINGS: The heart size and mediastinal contours are within normal limits. No pneumothorax is noted. Right lung is clear. Right-sided PICC line is unchanged in position. Stable large left pleural effusion is noted with probable underlying atelectasis or infiltrate. The visualized skeletal structures are unremarkable. IMPRESSION: Stable large left pleural effusion is noted with probable underlying atelectasis or infiltrate. Electronically Signed   By: Marijo Conception, M.D.   On: 0Apr 15, 202019 15:53   Ct Angio Chest Pe W Or Wo Contrast  Result Date: 10/15/2017 CLINICAL DATA:  Evaluate for pulmonary embolus. Shortness of breath. EXAM: CT ANGIOGRAPHY CHEST WITH CONTRAST TECHNIQUE: Multidetector CT imaging of the chest was performed using the standard protocol during bolus administration of intravenous contrast. Multiplanar CT image reconstructions and MIPs were obtained to evaluate the vascular anatomy. CONTRAST:  193mL ISOVUE-370 IOPAMIDOL (ISOVUE-370) INJECTION 76% COMPARISON:  Chest CT 09/25/2017. FINDINGS: Cardiovascular: Normal heart size. No pericardial effusion. Normal caliber thoracic aorta. Central venous catheter tip terminates in the superior vena cava. Adequate opacification of the pulmonary arterial system. Motion artifact limits evaluation. No filling defect identified to suggest acute pulmonary embolus. Mediastinum/Nodes: No enlarged axillary, mediastinal or hilar lymphadenopathy.  Esophagus is normal in appearance. Lungs/Pleura: Central airways are patent. Endobronchial material within the mid and peripheral right lower lobe bronchi which may represent aspiration. Focal consolidation surrounding the peripheral right lower lobe bronchi. Large layering left pleural effusion. There is consolidation involving the left lower lobe. 6 mm left upper lobe nodule (image 37; series 9), in the location of a prior cyst. 7 mm cystic change right upper lobe peripherally (image 59; series 9) in the area of a prior pulmonary nodule. Previously described right lower lobe nodule is significantly decreased in size measuring 8 mm (image 107; series 8), previously 13 mm. Upper Abdomen: Reflux of contrast into the hepatic veins. No acute process. Musculoskeletal: No aggressive or acute appearing osseous lesions. Review of the MIP images confirms the above findings. IMPRESSION: 1. No evidence of acute pulmonary embolus. 2. Interval decrease in size of many of the previously described pulmonary nodules, likely secondary to improving infectious/inflammatory process. 3. Endobronchial material within the peripheral right lower lobe bronchi with adjacent consolidation which may represent aspiration. 4. Interval increase in size of large left pleural effusion and left basilar atelectasis. 5. Reflux of contrast into the hepatic veins raising the possibility of right heart insufficiency. Electronically Signed   By: Lovey Newcomer M.D.   On: 10/15/2017 12:24   Mr Hip Right W Wo Contrast  Result Date: 10/02/2017 CLINICAL DATA:  Bilateral hip and back pain.  Left worse than right. EXAM: MRI OF THE LEFT HIP WITHOUT AND WITH CONTRAST TECHNIQUE: Multiplanar, multisequence MR imaging was performed both before and after administration of intravenous contrast. CONTRAST:  28mL MULTIHANCE GADOBENATE DIMEGLUMINE 529 MG/ML IV SOLN COMPARISON:  None. FINDINGS: Bones: No hip fracture, dislocation or avascular necrosis. Marrow edema in the  left femoral head and acetabulum. Mild marrow edema in the anterior right acetabulum. Bilateral pericapsular edema and enhancement. No periosteal reaction or bone destruction. No aggressive osseous lesion. Normal sacrum and sacroiliac joints. No SI joint widening or erosive  changes. Articular cartilage and labrum Articular cartilage: Partial-thickness bilateral femoral heads and acetabulum, left greater than right. Small femoral inferior marginal osteophytes. Labrum: Grossly intact, but evaluation is limited by lack of intraarticular fluid. Joint or bursal effusion Joint effusion: Large left hip joint effusion with severe synovitis and enhancement. Small right hip joint effusion with synovitis and enhancement. No SI joint effusion. Bursae:  No bursa formation. Muscles and tendons Muscle edema in the left obturator internus muscle with a 13 mm fluid collection posteriorly within the muscle concerning for a small abscess. Muscle edema and mild enhancement in the adductor musculature bilaterally and right iliacus muscle. Muscle edema and enhancement of bilateral multifidus muscles. Other findings Miscellaneous: No pelvic free fluid. No fluid collection or hematoma. No inguinal lymphadenopathy. No inguinal hernia. IMPRESSION: 1. Large left hip joint effusion with enhancing synovitis, pericapsular edema and severe marrow edema on either side of the joint most concerning for septic arthritis. 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 2. Small right hip joint effusion with enhancing synovitis and pericapsular edema and enhancement most concerning for septic arthritis. 3. Muscle edema within the adductor musculature bilaterally and multifidus muscles bilaterally most concerning for infectious myositis. 13 mm fluid collection in the posterior aspect of the left obturator internus muscle most concerning for a small abscess. Electronically Signed   By: Kathreen Devoid   On: 10/02/2017 11:36   Mr Hip Left  W Wo Contrast  Result Date: 10/02/2017 CLINICAL DATA:  Bilateral hip and back pain.  Left worse than right. EXAM: MRI OF THE LEFT HIP WITHOUT AND WITH CONTRAST TECHNIQUE: Multiplanar, multisequence MR imaging was performed both before and after administration of intravenous contrast. CONTRAST:  66mL MULTIHANCE GADOBENATE DIMEGLUMINE 529 MG/ML IV SOLN COMPARISON:  None. FINDINGS: Bones: No hip fracture, dislocation or avascular necrosis. Marrow edema in the left femoral head and acetabulum. Mild marrow edema in the anterior right acetabulum. Bilateral pericapsular edema and enhancement. No periosteal reaction or bone destruction. No aggressive osseous lesion. Normal sacrum and sacroiliac joints. No SI joint widening or erosive changes. Articular cartilage and labrum Articular cartilage: Partial-thickness bilateral femoral heads and acetabulum, left greater than right. Small femoral inferior marginal osteophytes. Labrum: Grossly intact, but evaluation is limited by lack of intraarticular fluid. Joint or bursal effusion Joint effusion: Large left hip joint effusion with severe synovitis and enhancement. Small right hip joint effusion with synovitis and enhancement. No SI joint effusion. Bursae:  No bursa formation. Muscles and tendons Muscle edema in the left obturator internus muscle with a 13 mm fluid collection posteriorly within the muscle concerning for a small abscess. Muscle edema and mild enhancement in the adductor musculature bilaterally and right iliacus muscle. Muscle edema and enhancement of bilateral multifidus muscles. Other findings Miscellaneous: No pelvic free fluid. No fluid collection or hematoma. No inguinal lymphadenopathy. No inguinal hernia. IMPRESSION: 1. Large left hip joint effusion with enhancing synovitis, pericapsular edema and severe marrow edema on either side of the joint most concerning for septic arthritis. 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 2. Small  right hip joint effusion with enhancing synovitis and pericapsular edema and enhancement most concerning for septic arthritis. 3. Muscle edema within the adductor musculature bilaterally and multifidus muscles bilaterally most concerning for infectious myositis. 13 mm fluid collection in the posterior aspect of the left obturator internus muscle most concerning for a small abscess. Electronically Signed   By: Kathreen Devoid   On: 10/02/2017 11:36   Dg [facility]  1 View  Result Date: 10/06/2017 CLINICAL DATA:  Fever and left upper chest pain for 1 week. EXAM: PORTABLE CHEST 1 VIEW COMPARISON:  Chest CTA 09/25/2017 FINDINGS: A right PICC terminates over the high right atrium. The cardiac silhouette is normal in size. Veiling opacity in the left mid and lower hemithorax is consistent with a persistent, small to moderate-sized pleural effusion. Mild parenchymal opacity in the left lung base likely represents atelectasis. No right lung consolidation is seen. Nodular right lung opacities on the prior CT are not clearly apparent on this radiograph. No pneumothorax is identified. No acute osseous abnormality is seen. IMPRESSION: Left pleural effusion and mild left basilar atelectasis. Electronically Signed   By: Logan Bores M.D.   On: 10/06/2017 11:12   Dg Hips Bilat With Pelvis 2v  Result Date: 10/01/2017 CLINICAL DATA:  Hip pain. EXAM: DG HIP (WITH OR WITHOUT PELVIS) 2V BILAT COMPARISON:  None. FINDINGS: There are degenerative changes in the left hip with an osteophyte off the femoral neck. No significant loss of joint space. No fractures or dislocations identified. No other acute abnormalities. 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  No other abnormalities. Electronically Signed   By: Dorise Bullion III M.D   On: 10/01/2017 12:46    Lab Data:  CBC: Recent Labs  Lab 10/23/17 0342 10/26/17 0448  WBC 8.6 11.1*  HGB 8.0* 8.8*  HCT 25.7* 28.1*  MCV 90.8 90.9  PLT 289 106   Basic Metabolic  Panel: Recent Labs  Lab 10/23/17 0342 10/26/17 0448  NA 134* 134*  K 4.2 4.2  CL 96* 97*  CO2 28 27  GLUCOSE 111* 97  BUN 8 5*  CREATININE 0.56* 0.56*  CALCIUM 8.3* 8.7*   GFR: Estimated Creatinine Clearance: 116 mL/min (A) (by C-G formula based on SCr of 0.56 mg/dL (L)). Liver Function Tests: No results for input(s): AST, ALT, ALKPHOS, BILITOT, PROT, ALBUMIN in the last 168 hours. No results for input(s): LIPASE, AMYLASE in the last 168 hours. No results for input(s): AMMONIA in the last 168 hours. Coagulation Profile: No results for input(s): INR, PROTIME in the last 168 hours. Cardiac Enzymes: No results for input(s): CKTOTAL, CKMB, CKMBINDEX, TROPONINI in the last 168 hours. BNP (last 3 results) No results for input(s): PROBNP in the last 8760 hours. HbA1C: No results for input(s): HGBA1C in the last 72 hours. CBG: No results for input(s): GLUCAP in the last 168 hours. Lipid Profile: No results for input(s): CHOL, HDL, LDLCALC, TRIG, CHOLHDL, LDLDIRECT in the last 72 hours. Thyroid Function Tests: No results for input(s): TSH, T4TOTAL, FREET4, T3FREE, THYROIDAB in the last 72 hours. Anemia Panel: No results for input(s): VITAMINB12, FOLATE, FERRITIN, TIBC, IRON, RETICCTPCT in the last 72 hours. Urine analysis:    Component Value Date/Time   COLORURINE YELLOW 10/06/2017 [place] 10/06/2017 1746   LABSPEC 1.017 10/06/2017 1746   PHURINE 7.0 10/06/2017 1746   GLUCOSEU NEGATIVE 10/06/2017 1746   HGBUR MODERATE (A) 10/06/2017 1746   BILIRUBINUR NEGATIVE 10/06/2017 1746   KETONESUR NEGATIVE 10/06/2017 1746   PROTEINUR NEGATIVE 10/06/2017 1746   UROBILINOGEN 0.2 05/16/2012 0847   NITRITE NEGATIVE 10/06/2017 1746   LEUKOCYTESUR NEGATIVE 10/06/2017 1746     Lucion Dilger M.D. [facility] 10/28/2017, 2:27 PM  Pager: [phone] Between 7am to 7pm - call Pager - [phone]  After 7pm go to [email or web address] - password [email or web address]  Call night coverage  person covering after 7pm

## 2017-10-29 LAB — CBC
HEMATOCRIT: 28.1 % — AB (ref 39.0–52.0)
Hemoglobin: 8.7 g/dL — ABNORMAL LOW (ref 13.0–17.0)
MCH: 28.2 pg (ref 26.0–34.0)
MCHC: 31 g/dL (ref 30.0–36.0)
MCV: 90.9 fL (ref 78.0–100.0)
Platelets: 293 10*3/uL (ref 150–400)
RBC: 3.09 MIL/uL — ABNORMAL LOW (ref 4.22–5.81)
RDW: 14.4 % (ref 11.5–15.5)
WBC: 8 10*3/uL (ref 4.0–10.5)

## 2017-10-29 LAB — BASIC METABOLIC PANEL
Anion gap: 9 (ref 5–15)
BUN: 5 mg/dL — AB (ref 6–20)
CALCIUM: 8.6 mg/dL — AB (ref 8.9–10.3)
CO2: 28 mmol/L (ref 22–32)
Chloride: 98 mmol/L — ABNORMAL LOW (ref 101–111)
Creatinine, Ser: 0.56 mg/dL — ABNORMAL LOW (ref 0.61–1.24)
GFR calc Af Amer: >60 mL/min (ref >60)
GLUCOSE: 116 mg/dL — AB (ref 65–99)
Potassium: 3.7 mmol/L (ref 3.5–5.1)
Sodium: 135 mmol/L (ref 135–145)

## 2017-10-29 NOTE — Progress Notes (Signed)
[facility]                                                                              Patient Demographics  Gary Hicks, is a 32 y.o. male, DOB - 1985/10/17, CWC:[id number]  Admit date - 09/11/2017   Admitting Physician Quintella Baton, MD  Outpatient Primary MD for the patient is Patient, [name] Per  Outpatient specialists:   LOS - 48  days   Medical records reviewed and are as summarized below:    Chief Complaint  Patient presents with  . Weakness  . Leg Pain       Brief summary   32 year old male, active IVDA, who presents with generalized pain. Patient was incarcerated and released 3-4 weeks ago. Patient admits using IV methamphetamine as well as heroin in the past 24 hours prior to admission. Reports global polyarthralgias including his bilateral feet, bilateral knees, bilateral hips and shoulders. Also reports fevers and chills generalized myalgias. Pt admitted for further management. 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 Pt was also diagnosed with DVT Right gastrocnemius vein . He was started on Lovenox 2/18.He was also diagnosed with left knee septic infection. He underwent bursectomy and I and D by Dr Percell Miller 2-25.He reported worsening left hip pain 3-02, MRI showed, B/L hipSeptic arthritis and 13 mm fluid collection in the posterior aspect of the left obturator internus muscle most concerning for a small abscess.IR was consulted for arthrocentesis, patient couldn't tolerate procedure. Dr Percell Miller performed I and D of bilateral hip on 3/5.  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  Blood cultures 2/11 grew MSSA, repeat on 2/15 and 3/7 have been negative -2D echo showed large 1.1 cmx 1.8 cm multilobulated highly mobile vegetation on the right ventricular aspect of the  anterior leaflet of the tricuspid valve.  Moderate to severe tricuspid regurgitation. -ID was consulted, recommended continue IV Ancef for 6 weeks from 2/23 till 11/05/17 -Ancef changed to cefepime on 3/21 due to new fevers/HCAP, completed on 3/26.  ID was reconsulted and suspected septic embolization from endocarditis causing pleuritic right-sided chest pain dyspnea, cough, possible H CAP.  Patient also has left-sided pleural effusion that he declined to do thoracentesis. -History of IV drug use, currently on Suboxone, difficult placement hence patient will stay inpatient for the duration of antibiotics -CTA chest negative for PE, no chest pain currently -No acute issues overnight, no complaints   Septic left knee, bilateral hip joints -MRI of the left knee showed 4.8 x0.8x 4 cm fluid collection in the subcutaneous fat anterior to patella tendon with severe surrounding soft tissue edema concerning for septic arthritis. -MRI hip showed septic arthritis and 13 mm fluid collection in the posterior aspect of the left obturator internus muscle concerning for small abscess -Orthopedics was consulted (Dr Percell Miller).  Status post I&D and bursectomy of the left knee prepatellar bursitis on 09/23/17, status post I&D of septic arthritis bilateral hip on 10/04/17 -Cultures intraoperatively negative -MRI brain negative for abscess, Doppler of the  left arm negative for DVT.   Left-sided pleural effusion -Seen on CT scan, moderate, patient has decided not to pursue thoracentesis at all. - Patient has repeatedly declined thoracentesis.  DVT right gastrocnemius vein with superficial saphenous thrombophlebitis -Continue Eliquis.  History of IV drug use -Dr. Damita Dunnings managing Suboxone twice daily -No high-dose IV benzos unless required for seizures, no extra narcotics  Malnutrition -Continue nutritional supplements, dietitian was consulted  Generalized anxiety disorder -Psych was consulted, continue Zoloft  50 mg daily -May continue Klonopin as needed for 2 weeks and then wean off, not advised for long-term. -Psych was reconsulted for follow-up, recommended increasing the Zoloft and tapering off the Klonopin  Seizures -Likely due to drug use, continue seizure precautions, as needed Ativan  -MRI brain negative for any abscess  Iron deficiency anemia -H&H stable  Thrombocytopenia -Resolved  Hyponatremia, hypokalemia -Resolved  Hepatitis C antibody positive -ID following  Vitamin D deficiency - Continue supplementation  Constipation -Resolved, continue Senokot S, MiraLAX   Code Status: full  DVT Prophylaxis: Apixaban Family Communication: Discussed in detail with the patient, all imaging results, lab results explained to the patient    Disposition Plan: Will stay inpatient for the duration of IV antibiotics, end date 11/05/17, unless change recommended by ID.  No acute issues    Time Spent in minutes 15 minutes  Procedures:  I&D of left knee on 09/23/17 I&D of bilateral hip on 10/04/17  Consultants:   Infectious disease Orthopedics  Antimicrobials:   IV Ancef till 11/05/17  Cefepime to complete on 3/26 today   Medications  Scheduled Meds: . apixaban  5 mg Oral BID  . buprenorphine-naloxone  1 tablet Sublingual BID  . famotidine  20 mg Oral Daily  . feeding supplement (ENSURE ENLIVE)  237 mL Oral TID BM  . ferrous sulfate  325 mg Oral BID WC  . hydrocortisone cream   Topical BID  . multivitamin with minerals  1 tablet Oral Daily  . nicotine  14 mg Transdermal Daily  . polyethylene glycol  17 g Oral BID  . senna-docusate  1 tablet Oral BID  . sertraline  50 mg Oral Daily  . sodium chloride flush  10-40 mL Intracatheter Q12H  . Vitamin D (Ergocalciferol)  50,000 Units Oral Q Mon   Continuous Infusions: .  ceFAZolin (ANCEF) IV 2 g (10/29/17 0559)  . lactated ringers 10 mL/hr at 09/23/17 1329  . lactated ringers 10 mL/hr at 10/04/17 1237   PRN  Meds:.acetaminophen, alum & mag hydroxide-simeth, clonazePAM, hydrOXYzine, ondansetron **OR** ondansetron (ZOFRAN) IV, oxyCODONE, sodium chloride, sodium chloride flush, sodium chloride flush, traZODone   Antibiotics   Anti-infectives (From admission, onward)   Start     Dose/Rate Route Frequency Ordered Stop   10/26/17 0600  ceFAZolin (ANCEF) IVPB 2g/100 mL premix     2 g 200 mL/hr over 30 Minutes Intravenous Every 8 hours 10/21/17 1123 11/06/17 0559   10/20/17 1600  ceFEPIme (MAXIPIME) 2 g in sodium chloride 0.9 % 100 mL IVPB     2 g 200 mL/hr over 30 Minutes Intravenous Every 8 hours 10/20/17 1517 10/25/17 1800   09/23/17 0600  ceFAZolin (ANCEF) IVPB 2g/100 mL premix     2 g 200 mL/hr over 30 Minutes Intravenous To Surgery 09/22/17 1956 09/23/17 1436   09/23/17 0600  ceFAZolin (ANCEF) IVPB 2g/100 mL premix  Status:  Discontinued     2 g 200 mL/hr over 30 Minutes Intravenous On call to O.R. 09/22/17 1956 09/22/17 2002  09/15/17 1500  Oritavancin Diphosphate (ORBACTIV) 1,200 mg in dextrose 5 % IVPB     1,200 mg 333.3 mL/hr over 180 Minutes Intravenous Once 09/15/17 1357 09/15/17 2100   09/12/17 1400  ceFAZolin (ANCEF) IVPB 2g/100 mL premix  Status:  Discontinued     2 g 200 mL/hr over 30 Minutes Intravenous Every 8 hours 09/12/17 1300 10/20/17 1517   09/11/17 2100  vancomycin (VANCOCIN) IVPB 750 mg/150 ml premix  Status:  Discontinued     750 mg 150 mL/hr over 60 Minutes Intravenous Every 8 hours 09/11/17 2015 09/12/17 1300        Subjective:   Gary Hicks was seen and examined today.  Denies any specific complaints.  Resting.  No fevers.  Patient denies dizziness, abdominal pain, N/V/D/C, new weakness, numbess, tingling. No acute events overnight.    Objective:   Vitals:   10/28/17 1806 10/28/17 2125 10/29/17 0430 10/29/17 0932  BP: 100/65 113/79 113/77 97/66  Pulse: (!) 112 (!) 126 83 97  Resp: 18 20 19 18   Temp: 98.2 F (36.8 C) (!) 97.5 F (36.4 C) 98.6 F (37 C)  98.4 F (36.9 C)  TempSrc: Oral Oral Oral Oral  SpO2: 96% 100% 98% 96%  Weight:  61.2 kg (134 lb 14.7 oz)    Height:        Intake/Output Summary (Last 24 hours) at 10/29/2017 1243 Last data filed at 10/29/2017 1039 Gross per 24 hour  Intake 660 ml  Output 0 ml  Net 660 ml     Wt Readings from Last 3 Encounters:  10/28/17 61.2 kg (134 lb 14.7 oz)  09/10/17 63.5 kg (140 lb)  01/05/15 63.5 kg (140 lb)     Exam   General: Alert and oriented x 3, NAD  Eyes:   HEENT:    Cardiovascular: S1 S2 auscultated, . Regular rate and rhythm. No pedal edema b/l  Respiratory: Clear to auscultation bilaterally, no wheezing, rales or rhonchi  Gastrointestinal: Soft, nontender, nondistended, + bowel sounds  Ext: no pedal edema bilaterally  Neuro:  Musculoskeletal: No digital cyanosis, clubbing  Skin: tattoos  Psych: Normal affect and demeanor, alert and oriented x3    Data Reviewed:  I have personally reviewed following labs and imaging studies  Micro Results Recent Results (from the past 240 hour(s))  Culture, blood (routine x 2)     Status: None   Collection Time: 10/19/17  2:06 PM  Result Value Ref Range Status   Specimen Description BLOOD BLOOD LEFT WRIST  Final   Special Requests   Final    BOTTLES DRAWN AEROBIC ONLY Blood Culture results may not be optimal due to an inadequate volume of blood received in culture bottles   Culture   Final    NO GROWTH 5 DAYS Performed at [facility], [place] [address] [place], [place] [address]    Report Status 10/24/2017 FINAL  Final    Radiology Reports Dg Chest 2 View  Result Date: 10/19/2017 CLINICAL DATA:  New onset of fever. Recent episodes of chest pain. History of endocarditis, substance abuse, current smoker. EXAM: CHEST - 2 VIEW COMPARISON:  Chest x-ray and chest CT scan of October 13, 2017 FINDINGS: The lungs are adequately inflated. There is new airspace opacity in the right lower lung. There is persistent increased  density in the left mid and lower lung with obscuration of the left hemidiaphragm. The heart is top-normal in size. The pulmonary vascularity is normal. The right PICC line tip projects  over the distal third of the SVC. IMPRESSION: Persistent left lower lobe atelectasis or pneumonia with moderate-sized left pleural effusion. New infiltrate in the right lower lobe compatible with pneumonia. Electronically Signed   By: David  Martinique M.D.   On: 10/19/2017 13:32   Dg Chest 2 View  Result Date: 08-24-202019 CLINICAL DATA:  Chest pain. EXAM: CHEST - 2 VIEW COMPARISON:  Radiograph of October 06, 2017. FINDINGS: The heart size and mediastinal contours are within normal limits. No pneumothorax is noted. Right lung is clear. Right-sided PICC line is unchanged in position. Stable large left pleural effusion is noted with probable underlying atelectasis or infiltrate. The visualized skeletal structures are unremarkable. IMPRESSION: Stable large left pleural effusion is noted with probable underlying atelectasis or infiltrate. Electronically Signed   By: Marijo Conception, M.D.   On: 008-24-202019 15:53   Ct Angio Chest Pe W Or Wo Contrast  Result Date: 10/15/2017 CLINICAL DATA:  Evaluate for pulmonary embolus. Shortness of breath. EXAM: CT ANGIOGRAPHY CHEST WITH CONTRAST TECHNIQUE: Multidetector CT imaging of the chest was performed using the standard protocol during bolus administration of intravenous contrast. Multiplanar CT image reconstructions and MIPs were obtained to evaluate the vascular anatomy. CONTRAST:  175mL ISOVUE-370 IOPAMIDOL (ISOVUE-370) INJECTION 76% COMPARISON:  Chest CT 09/25/2017. FINDINGS: Cardiovascular: Normal heart size. No pericardial effusion. Normal caliber thoracic aorta. Central venous catheter tip terminates in the superior vena cava. Adequate opacification of the pulmonary arterial system. Motion artifact limits evaluation. No filling defect identified to suggest acute pulmonary embolus.  Mediastinum/Nodes: No enlarged axillary, mediastinal or hilar lymphadenopathy. Esophagus is normal in appearance. Lungs/Pleura: Central airways are patent. Endobronchial material within the mid and peripheral right lower lobe bronchi which may represent aspiration. Focal consolidation surrounding the peripheral right lower lobe bronchi. Large layering left pleural effusion. There is consolidation involving the left lower lobe. 6 mm left upper lobe nodule (image 37; series 9), in the location of a prior cyst. 7 mm cystic change right upper lobe peripherally (image 59; series 9) in the area of a prior pulmonary nodule. Previously described right lower lobe nodule is significantly decreased in size measuring 8 mm (image 107; series 8), previously 13 mm. Upper Abdomen: Reflux of contrast into the hepatic veins. No acute process. Musculoskeletal: No aggressive or acute appearing osseous lesions. Review of the MIP images confirms the above findings. IMPRESSION: 1. No evidence of acute pulmonary embolus. 2. Interval decrease in size of many of the previously described pulmonary nodules, likely secondary to improving infectious/inflammatory process. 3. Endobronchial material within the peripheral right lower lobe bronchi with adjacent consolidation which may represent aspiration. 4. Interval increase in size of large left pleural effusion and left basilar atelectasis. 5. Reflux of contrast into the hepatic veins raising the possibility of right heart insufficiency. Electronically Signed   By: Lovey Newcomer M.D.   On: 10/15/2017 12:24   Mr Hip Right W Wo Contrast  Result Date: 10/02/2017 CLINICAL DATA:  Bilateral hip and back pain.  Left worse than right. EXAM: MRI OF THE LEFT HIP WITHOUT AND WITH CONTRAST TECHNIQUE: Multiplanar, multisequence MR imaging was performed both before and after administration of intravenous contrast. CONTRAST:  5mL MULTIHANCE GADOBENATE DIMEGLUMINE 529 MG/ML IV SOLN COMPARISON:  None. FINDINGS:  Bones: No hip fracture, dislocation or avascular necrosis. Marrow edema in the left femoral head and acetabulum. Mild marrow edema in the anterior right acetabulum. Bilateral pericapsular edema and enhancement. No periosteal reaction or bone destruction. No aggressive osseous lesion. Normal sacrum and  sacroiliac joints. No SI joint widening or erosive changes. Articular cartilage and labrum Articular cartilage: Partial-thickness bilateral femoral heads and acetabulum, left greater than right. Small femoral inferior marginal osteophytes. Labrum: Grossly intact, but evaluation is limited by lack of intraarticular fluid. Joint or bursal effusion Joint effusion: Large left hip joint effusion with severe synovitis and enhancement. Small right hip joint effusion with synovitis and enhancement. No SI joint effusion. Bursae:  No bursa formation. Muscles and tendons Muscle edema in the left obturator internus muscle with a 13 mm fluid collection posteriorly within the muscle concerning for a small abscess. Muscle edema and mild enhancement in the adductor musculature bilaterally and right iliacus muscle. Muscle edema and enhancement of bilateral multifidus muscles. Other findings Miscellaneous: No pelvic free fluid. No fluid collection or hematoma. No inguinal lymphadenopathy. No inguinal hernia. IMPRESSION: 1. Large left hip joint effusion with enhancing synovitis, pericapsular edema and severe marrow edema on either side of the joint most concerning for septic arthritis. 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 2. Small right hip joint effusion with enhancing synovitis and pericapsular edema and enhancement most concerning for septic arthritis. 3. Muscle edema within the adductor musculature bilaterally and multifidus muscles bilaterally most concerning for infectious myositis. 13 mm fluid collection in the posterior aspect of the left obturator internus muscle most concerning for a small abscess.  Electronically Signed   By: Kathreen Devoid   On: 10/02/2017 11:36   Mr Hip Left W Wo Contrast  Result Date: 10/02/2017 CLINICAL DATA:  Bilateral hip and back pain.  Left worse than right. EXAM: MRI OF THE LEFT HIP WITHOUT AND WITH CONTRAST TECHNIQUE: Multiplanar, multisequence MR imaging was performed both before and after administration of intravenous contrast. CONTRAST:  69mL MULTIHANCE GADOBENATE DIMEGLUMINE 529 MG/ML IV SOLN COMPARISON:  None. FINDINGS: Bones: No hip fracture, dislocation or avascular necrosis. Marrow edema in the left femoral head and acetabulum. Mild marrow edema in the anterior right acetabulum. Bilateral pericapsular edema and enhancement. No periosteal reaction or bone destruction. No aggressive osseous lesion. Normal sacrum and sacroiliac joints. No SI joint widening or erosive changes. Articular cartilage and labrum Articular cartilage: Partial-thickness bilateral femoral heads and acetabulum, left greater than right. Small femoral inferior marginal osteophytes. Labrum: Grossly intact, but evaluation is limited by lack of intraarticular fluid. Joint or bursal effusion Joint effusion: Large left hip joint effusion with severe synovitis and enhancement. Small right hip joint effusion with synovitis and enhancement. No SI joint effusion. Bursae:  No bursa formation. Muscles and tendons Muscle edema in the left obturator internus muscle with a 13 mm fluid collection posteriorly within the muscle concerning for a small abscess. Muscle edema and mild enhancement in the adductor musculature bilaterally and right iliacus muscle. Muscle edema and enhancement of bilateral multifidus muscles. Other findings Miscellaneous: No pelvic free fluid. No fluid collection or hematoma. No inguinal lymphadenopathy. No inguinal hernia. IMPRESSION: 1. Large left hip joint effusion with enhancing synovitis, pericapsular edema and severe marrow edema on either side of the joint most concerning for septic  arthritis. 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 2. Small right hip joint effusion with enhancing synovitis and pericapsular edema and enhancement most concerning for septic arthritis. 3. Muscle edema within the adductor musculature bilaterally and multifidus muscles bilaterally most concerning for infectious myositis. 13 mm fluid collection in the posterior aspect of the left obturator internus muscle most concerning for a small abscess. Electronically Signed   By: Kathreen Devoid  On: 10/02/2017 11:36   Dg Chest Port 1 View  Result Date: 10/06/2017 CLINICAL DATA:  Fever and left upper chest pain for 1 week. EXAM: PORTABLE CHEST 1 VIEW COMPARISON:  Chest CTA 09/25/2017 FINDINGS: A right PICC terminates over the high right atrium. The cardiac silhouette is normal in size. Veiling opacity in the left mid and lower hemithorax is consistent with a persistent, small to moderate-sized pleural effusion. Mild parenchymal opacity in the left lung base likely represents atelectasis. No right lung consolidation is seen. Nodular right lung opacities on the prior CT are not clearly apparent on this radiograph. No pneumothorax is identified. No acute osseous abnormality is seen. IMPRESSION: Left pleural effusion and mild left basilar atelectasis. Electronically Signed   By: Logan Bores M.D.   On: 10/06/2017 11:12   Dg Hips Bilat With Pelvis 2v  Result Date: 10/01/2017 CLINICAL DATA:  Hip pain. EXAM: DG HIP (WITH OR WITHOUT PELVIS) 2V BILAT COMPARISON:  None. FINDINGS: There are degenerative changes in the left hip with an osteophyte off the femoral neck. No significant loss of joint space. No fractures or dislocations identified. No other acute abnormalities. 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  No other abnormalities. Electronically Signed   By: Dorise Bullion III M.D   On: 10/01/2017 12:46    Lab Data:  CBC: Recent Labs  Lab 10/23/17 0342 10/26/17 0448  10/29/17 0827  WBC 8.6 11.1* 8.0  HGB 8.0* 8.8* 8.7*  HCT 25.7* 28.1* 28.1*  MCV 90.8 90.9 90.9  PLT 289 314 115   Basic Metabolic Panel: Recent Labs  Lab 10/23/17 0342 10/26/17 0448 10/29/17 0827  NA 134* 134* 135  K 4.2 4.2 3.7  CL 96* 97* 98*  CO2 28 27 28   GLUCOSE 111* 97 116*  BUN 8 5* 5*  CREATININE 0.56* 0.56* 0.56*  CALCIUM 8.3* 8.7* 8.6*   GFR: Estimated Creatinine Clearance: 115.8 mL/min (A) (by C-G formula based on SCr of 0.56 mg/dL (L)). Liver Function Tests: No results for input(s): AST, ALT, ALKPHOS, BILITOT, PROT, ALBUMIN in the last 168 hours. No results for input(s): LIPASE, AMYLASE in the last 168 hours. No results for input(s): AMMONIA in the last 168 hours. Coagulation Profile: No results for input(s): INR, PROTIME in the last 168 hours. Cardiac Enzymes: No results for input(s): CKTOTAL, CKMB, CKMBINDEX, TROPONINI in the last 168 hours. BNP (last 3 results) No results for input(s): PROBNP in the last 8760 hours. HbA1C: No results for input(s): HGBA1C in the last 72 hours. CBG: No results for input(s): GLUCAP in the last 168 hours. Lipid Profile: No results for input(s): CHOL, HDL, LDLCALC, TRIG, CHOLHDL, LDLDIRECT in the last 72 hours. Thyroid Function Tests: No results for input(s): TSH, T4TOTAL, FREET4, T3FREE, THYROIDAB in the last 72 hours. Anemia Panel: No results for input(s): VITAMINB12, FOLATE, FERRITIN, TIBC, IRON, RETICCTPCT in the last 72 hours. Urine analysis:    Component Value Date/Time   COLORURINE YELLOW 10/06/2017 [place] 10/06/2017 1746   LABSPEC 1.017 10/06/2017 1746   PHURINE 7.0 10/06/2017 1746   GLUCOSEU NEGATIVE 10/06/2017 1746   HGBUR MODERATE (A) 10/06/2017 1746   BILIRUBINUR NEGATIVE 10/06/2017 1746   KETONESUR NEGATIVE 10/06/2017 1746   PROTEINUR NEGATIVE 10/06/2017 1746   UROBILINOGEN 0.2 05/16/2012 0847   NITRITE NEGATIVE 10/06/2017 1746   LEUKOCYTESUR NEGATIVE 10/06/2017 1746      Ashli Selders M.D. [facility] 10/29/2017, 12:43 PM  Pager: [phone] Between 7am to 7pm - call Pager -  [phone]  After 7pm go to [email or web address] - password [email or web address]  Call night coverage person covering after 7pm

## 2017-10-30 NOTE — Progress Notes (Signed)
[facility]                                                                              Patient Demographics  Gary Hicks, is a 32 y.o. male, DOB - Aug 26, 1985, EQA:[id number]  Admit date - 09/11/2017   Admitting Physician Quintella Baton, MD  Outpatient Primary MD for the patient is Patient, [name] Per  Outpatient specialists:   LOS - 49  days   Medical records reviewed and are as summarized below:    Chief Complaint  Patient presents with  . Weakness  . Leg Pain       Brief summary   32 year old male, active IVDA, who presents with generalized pain. Patient was incarcerated and released 3-4 weeks ago. Patient admits using IV methamphetamine as well as heroin in the past 24 hours prior to admission. Reports global polyarthralgias including his bilateral feet, bilateral knees, bilateral hips and shoulders. Also reports fevers and chills generalized myalgias. Pt admitted for further management. 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 Pt was also diagnosed with DVT Right gastrocnemius vein . He was started on Lovenox 2/18.He was also diagnosed with left knee septic infection. He underwent bursectomy and I and D by Dr Percell Miller 2-25.He reported worsening left hip pain 3-02, MRI showed, B/L hipSeptic arthritis and 13 mm fluid collection in the posterior aspect of the left obturator internus muscle most concerning for a small abscess.IR was consulted for arthrocentesis, patient couldn't tolerate procedure. Dr Percell Miller performed I and D of bilateral hip on 3/5.  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  Blood cultures 2/11 grew MSSA, repeat on 2/15 and 3/7 have been negative -2D echo showed large 1.1 cmx 1.8 cm multilobulated highly mobile vegetation on the right ventricular aspect of the  anterior leaflet of the tricuspid valve.  Moderate to severe tricuspid regurgitation. -ID was consulted, recommended continue IV Ancef for 6 weeks from 2/23 till 11/05/17 -Ancef changed to cefepime on 3/21 due to new fevers/HCAP, completed on 3/26.  ID was reconsulted and suspected septic embolization from endocarditis causing pleuritic right-sided chest pain dyspnea, cough, possible H CAP.  Patient also has left-sided pleural effusion that he declined to do thoracentesis. -History of IV drug use, currently on Suboxone, difficult placement hence patient will stay inpatient for the duration of antibiotics -CTA chest negative for PE -No new complaints    Septic left knee, bilateral hip joints -MRI of the left knee showed 4.8 x0.8x 4 cm fluid collection in the subcutaneous fat anterior to patella tendon with severe surrounding soft tissue edema concerning for septic arthritis. -MRI hip showed septic arthritis and 13 mm fluid collection in the posterior aspect of the left obturator internus muscle concerning for small abscess -Orthopedics was consulted (Dr Percell Miller).  Status post I&D and bursectomy of the left knee prepatellar bursitis on 09/23/17, status post I&D of septic arthritis bilateral hip on 10/04/17 -Cultures intraoperatively negative -MRI brain negative for abscess, Doppler of the left arm negative for DVT.  Left-sided pleural effusion -Seen on CT scan, moderate, patient has decided not to pursue thoracentesis at all. - Patient has repeatedly declined thoracentesis.  DVT right gastrocnemius vein with superficial saphenous thrombophlebitis -Continue Eliquis.  History of IV drug use -Dr. Damita Dunnings managing Suboxone twice daily -No high-dose IV benzos unless required for seizures, no extra narcotics  Malnutrition -Continue nutritional supplements, dietitian was consulted  Generalized anxiety disorder -Psych was consulted, continue Zoloft 50 mg daily -May continue Klonopin as needed  for 2 weeks and then wean off, not advised for long-term. -Psych was reconsulted for follow-up, recommended increasing the Zoloft and tapering off the Klonopin  Seizures -Likely due to drug use, continue seizure precautions, as needed Ativan  -MRI brain negative for any abscess  Iron deficiency anemia -H&H stable  Thrombocytopenia -Resolved  Hyponatremia, hypokalemia -Resolved  Hepatitis C antibody positive -ID following  Vitamin D deficiency - Continue supplementation  Constipation -Resolved, continue Senokot S, MiraLAX   Code Status: full  DVT Prophylaxis: Apixaban Family Communication: Discussed in detail with the patient, all imaging results, lab results explained to the patient    Disposition Plan: Will stay inpatient for the duration of IV antibiotics, end date 11/05/17, unless change recommended by ID.  Reviewed labs, stable, no complaints from the patient    Time Spent in minutes 15 minutes  Procedures:  I&D of left knee on 09/23/17 I&D of bilateral hip on 10/04/17  Consultants:   Infectious disease Orthopedics  Antimicrobials:   IV Ancef till 11/05/17  Cefepime to complete on 3/26 today   Medications  Scheduled Meds: . apixaban  5 mg Oral BID  . buprenorphine-naloxone  1 tablet Sublingual BID  . famotidine  20 mg Oral Daily  . feeding supplement (ENSURE ENLIVE)  237 mL Oral TID BM  . ferrous sulfate  325 mg Oral BID WC  . hydrocortisone cream   Topical BID  . multivitamin with minerals  1 tablet Oral Daily  . nicotine  14 mg Transdermal Daily  . polyethylene glycol  17 g Oral BID  . senna-docusate  1 tablet Oral BID  . sertraline  50 mg Oral Daily  . sodium chloride flush  10-40 mL Intracatheter Q12H  . Vitamin D (Ergocalciferol)  50,000 Units Oral Q Mon   Continuous Infusions: .  ceFAZolin (ANCEF) IV Stopped (10/30/17 2992)  . lactated ringers 10 mL/hr at 09/23/17 1329  . lactated ringers 10 mL/hr at 10/04/17 1237   PRN  Meds:.acetaminophen, alum & mag hydroxide-simeth, clonazePAM, hydrOXYzine, ondansetron **OR** ondansetron (ZOFRAN) IV, oxyCODONE, sodium chloride, sodium chloride flush, sodium chloride flush, traZODone   Antibiotics   Anti-infectives (From admission, onward)   Start     Dose/Rate Route Frequency Ordered Stop   10/26/17 0600  ceFAZolin (ANCEF) IVPB 2g/100 mL premix     2 g 200 mL/hr over 30 Minutes Intravenous Every 8 hours 10/21/17 1123 11/06/17 0559   10/20/17 1600  ceFEPIme (MAXIPIME) 2 g in sodium chloride 0.9 % 100 mL IVPB     2 g 200 mL/hr over 30 Minutes Intravenous Every 8 hours 10/20/17 1517 10/25/17 1800   09/23/17 0600  ceFAZolin (ANCEF) IVPB 2g/100 mL premix     2 g 200 mL/hr over 30 Minutes Intravenous To Surgery 09/22/17 1956 09/23/17 1436   09/23/17 0600  ceFAZolin (ANCEF) IVPB 2g/100 mL premix  Status:  Discontinued     2 g 200 mL/hr over 30 Minutes Intravenous On call to O.R. 09/22/17 1956 09/22/17 2002   09/15/17 1500  Oritavancin Diphosphate (ORBACTIV) 1,200 mg in dextrose 5 % IVPB     1,200 mg 333.3 mL/hr over 180 Minutes Intravenous Once 09/15/17 1357 09/15/17 2100   09/12/17 1400  ceFAZolin (ANCEF) IVPB 2g/100 mL premix  Status:  Discontinued     2 g 200 mL/hr over 30 Minutes Intravenous Every 8 hours 09/12/17 1300 10/20/17 1517   09/11/17 2100  vancomycin (VANCOCIN) IVPB 750 mg/150 ml premix  Status:  Discontinued     750 mg 150 mL/hr over 60 Minutes Intravenous Every 8 hours 09/11/17 2015 09/12/17 1300        Subjective:   Corene Cornea Kuehnle was seen and examined today.  No complaints, no fevers overnight.   Patient denies dizziness, abdominal pain, N/V/D/C, new weakness, numbess, tingling.   Objective:   Vitals:   10/29/17 0430 10/29/17 0932 10/29/17 2100 10/30/17 0611  BP: 113/77 97/66 106/73 94/77  Pulse: 83 97 94 (!) 101  Resp: 19 18 18 18   Temp: 98.6 F (37 C) 98.4 F (36.9 C) 98.1 F (36.7 C) 98.4 F (36.9 C)  TempSrc: Oral Oral Oral Oral  SpO2:  98% 96% 96% 98%  Weight:   61.2 kg (134 lb 14.8 oz)   Height:        Intake/Output Summary (Last 24 hours) at 10/30/2017 1310 Last data filed at 10/30/2017 1256 Gross per 24 hour  Intake 1355 ml  Output 500 ml  Net 855 ml     Wt Readings from Last 3 Encounters:  10/29/17 61.2 kg (134 lb 14.8 oz)  09/10/17 63.5 kg (140 lb)  01/05/15 63.5 kg (140 lb)     Exam   General: Alert and oriented x 3, NAD  Eyes:   HEENT:    Cardiovascular: S1 S2 auscultated, Regular rate and rhythm. No pedal edema b/l  Respiratory: CTAB  Gastrointestinal: Soft, nontender, nondistended, + bowel sounds  Ext: no pedal edema bilaterally  Neuro: no new deficit  Musculoskeletal: No digital cyanosis, clubbing  Skin: No rashes  Psych: flat affect, alert and oriented x3    Data Reviewed:  I have personally reviewed following labs and imaging studies  Micro Results No results found for this or any previous visit (from the past 240 hour(s)).  Radiology Reports Dg Chest 2 View  Result Date: 10/19/2017 CLINICAL DATA:  New onset of fever. Recent episodes of chest pain. History of endocarditis, substance abuse, current smoker. EXAM: CHEST - 2 VIEW COMPARISON:  Chest x-ray and chest CT scan of October 13, 2017 FINDINGS: The lungs are adequately inflated. There is new airspace opacity in the right lower lung. There is persistent increased density in the left mid and lower lung with obscuration of the left hemidiaphragm. The heart is top-normal in size. The pulmonary vascularity is normal. The right PICC line tip projects over the distal third of the SVC. IMPRESSION: Persistent left lower lobe atelectasis or pneumonia with moderate-sized left pleural effusion. New infiltrate in the right lower lobe compatible with pneumonia. Electronically Signed   By: David  Martinique M.D.   On: 10/19/2017 13:32   Dg Chest 2 View  Result Date: 2020-10-2917 CLINICAL DATA:  Chest pain. EXAM: CHEST - 2 VIEW COMPARISON:   Radiograph of October 06, 2017. FINDINGS: The heart size and mediastinal contours are within normal limits. No pneumothorax is noted. Right lung is clear. Right-sided PICC line is unchanged in position. Stable large left pleural effusion is noted with probable underlying atelectasis or infiltrate. The visualized skeletal structures are unremarkable. IMPRESSION: Stable  large left pleural effusion is noted with probable underlying atelectasis or infiltrate. Electronically Signed   By: Marijo Conception, M.D.   On: April 16, 202019 15:53   Ct Angio Chest Pe W Or Wo Contrast  Result Date: 10/15/2017 CLINICAL DATA:  Evaluate for pulmonary embolus. Shortness of breath. EXAM: CT ANGIOGRAPHY CHEST WITH CONTRAST TECHNIQUE: Multidetector CT imaging of the chest was performed using the standard protocol during bolus administration of intravenous contrast. Multiplanar CT image reconstructions and MIPs were obtained to evaluate the vascular anatomy. CONTRAST:  178mL ISOVUE-370 IOPAMIDOL (ISOVUE-370) INJECTION 76% COMPARISON:  Chest CT 09/25/2017. FINDINGS: Cardiovascular: Normal heart size. No pericardial effusion. Normal caliber thoracic aorta. Central venous catheter tip terminates in the superior vena cava. Adequate opacification of the pulmonary arterial system. Motion artifact limits evaluation. No filling defect identified to suggest acute pulmonary embolus. Mediastinum/Nodes: No enlarged axillary, mediastinal or hilar lymphadenopathy. Esophagus is normal in appearance. Lungs/Pleura: Central airways are patent. Endobronchial material within the mid and peripheral right lower lobe bronchi which may represent aspiration. Focal consolidation surrounding the peripheral right lower lobe bronchi. Large layering left pleural effusion. There is consolidation involving the left lower lobe. 6 mm left upper lobe nodule (image 37; series 9), in the location of a prior cyst. 7 mm cystic change right upper lobe peripherally (image 59; series  9) in the area of a prior pulmonary nodule. Previously described right lower lobe nodule is significantly decreased in size measuring 8 mm (image 107; series 8), previously 13 mm. Upper Abdomen: Reflux of contrast into the hepatic veins. No acute process. Musculoskeletal: No aggressive or acute appearing osseous lesions. Review of the MIP images confirms the above findings. IMPRESSION: 1. No evidence of acute pulmonary embolus. 2. Interval decrease in size of many of the previously described pulmonary nodules, likely secondary to improving infectious/inflammatory process. 3. Endobronchial material within the peripheral right lower lobe bronchi with adjacent consolidation which may represent aspiration. 4. Interval increase in size of large left pleural effusion and left basilar atelectasis. 5. Reflux of contrast into the hepatic veins raising the possibility of right heart insufficiency. Electronically Signed   By: Lovey Newcomer M.D.   On: 10/15/2017 12:24   Mr Hip Right W Wo Contrast  Result Date: 10/02/2017 CLINICAL DATA:  Bilateral hip and back pain.  Left worse than right. EXAM: MRI OF THE LEFT HIP WITHOUT AND WITH CONTRAST TECHNIQUE: Multiplanar, multisequence MR imaging was performed both before and after administration of intravenous contrast. CONTRAST:  78mL MULTIHANCE GADOBENATE DIMEGLUMINE 529 MG/ML IV SOLN COMPARISON:  None. FINDINGS: Bones: No hip fracture, dislocation or avascular necrosis. Marrow edema in the left femoral head and acetabulum. Mild marrow edema in the anterior right acetabulum. Bilateral pericapsular edema and enhancement. No periosteal reaction or bone destruction. No aggressive osseous lesion. Normal sacrum and sacroiliac joints. No SI joint widening or erosive changes. Articular cartilage and labrum Articular cartilage: Partial-thickness bilateral femoral heads and acetabulum, left greater than right. Small femoral inferior marginal osteophytes. Labrum: Grossly intact, but  evaluation is limited by lack of intraarticular fluid. Joint or bursal effusion Joint effusion: Large left hip joint effusion with severe synovitis and enhancement. Small right hip joint effusion with synovitis and enhancement. No SI joint effusion. Bursae:  No bursa formation. Muscles and tendons Muscle edema in the left obturator internus muscle with a 13 mm fluid collection posteriorly within the muscle concerning for a small abscess. Muscle edema and mild enhancement in the adductor musculature bilaterally and right iliacus muscle. Muscle edema  and enhancement of bilateral multifidus muscles. Other findings Miscellaneous: No pelvic free fluid. No fluid collection or hematoma. No inguinal lymphadenopathy. No inguinal hernia. IMPRESSION: 1. Large left hip joint effusion with enhancing synovitis, pericapsular edema and severe marrow edema on either side of the joint most concerning for septic arthritis. 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 2. Small right hip joint effusion with enhancing synovitis and pericapsular edema and enhancement most concerning for septic arthritis. 3. Muscle edema within the adductor musculature bilaterally and multifidus muscles bilaterally most concerning for infectious myositis. 13 mm fluid collection in the posterior aspect of the left obturator internus muscle most concerning for a small abscess. Electronically Signed   By: Kathreen Devoid   On: 10/02/2017 11:36   Mr Hip Left W Wo Contrast  Result Date: 10/02/2017 CLINICAL DATA:  Bilateral hip and back pain.  Left worse than right. EXAM: MRI OF THE LEFT HIP WITHOUT AND WITH CONTRAST TECHNIQUE: Multiplanar, multisequence MR imaging was performed both before and after administration of intravenous contrast. CONTRAST:  79mL MULTIHANCE GADOBENATE DIMEGLUMINE 529 MG/ML IV SOLN COMPARISON:  None. FINDINGS: Bones: No hip fracture, dislocation or avascular necrosis. Marrow edema in the left femoral head and  acetabulum. Mild marrow edema in the anterior right acetabulum. Bilateral pericapsular edema and enhancement. No periosteal reaction or bone destruction. No aggressive osseous lesion. Normal sacrum and sacroiliac joints. No SI joint widening or erosive changes. Articular cartilage and labrum Articular cartilage: Partial-thickness bilateral femoral heads and acetabulum, left greater than right. Small femoral inferior marginal osteophytes. Labrum: Grossly intact, but evaluation is limited by lack of intraarticular fluid. Joint or bursal effusion Joint effusion: Large left hip joint effusion with severe synovitis and enhancement. Small right hip joint effusion with synovitis and enhancement. No SI joint effusion. Bursae:  No bursa formation. Muscles and tendons Muscle edema in the left obturator internus muscle with a 13 mm fluid collection posteriorly within the muscle concerning for a small abscess. Muscle edema and mild enhancement in the adductor musculature bilaterally and right iliacus muscle. Muscle edema and enhancement of bilateral multifidus muscles. Other findings Miscellaneous: No pelvic free fluid. No fluid collection or hematoma. No inguinal lymphadenopathy. No inguinal hernia. IMPRESSION: 1. Large left hip joint effusion with enhancing synovitis, pericapsular edema and severe marrow edema on either side of the joint most concerning for septic arthritis. 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 2. Small right hip joint effusion with enhancing synovitis and pericapsular edema and enhancement most concerning for septic arthritis. 3. Muscle edema within the adductor musculature bilaterally and multifidus muscles bilaterally most concerning for infectious myositis. 13 mm fluid collection in the posterior aspect of the left obturator internus muscle most concerning for a small abscess. Electronically Signed   By: Kathreen Devoid   On: 10/02/2017 11:36   Dg Chest Port 1 View  Result  Date: 10/06/2017 CLINICAL DATA:  Fever and left upper chest pain for 1 week. EXAM: PORTABLE CHEST 1 VIEW COMPARISON:  Chest CTA 09/25/2017 FINDINGS: A right PICC terminates over the high right atrium. The cardiac silhouette is normal in size. Veiling opacity in the left mid and lower hemithorax is consistent with a persistent, small to moderate-sized pleural effusion. Mild parenchymal opacity in the left lung base likely represents atelectasis. No right lung consolidation is seen. Nodular right lung opacities on the prior CT are not clearly apparent on this radiograph. No pneumothorax is identified. No acute osseous abnormality is seen. IMPRESSION: Left pleural effusion  and mild left basilar atelectasis. Electronically Signed   By: Logan Bores M.D.   On: 10/06/2017 11:12   Dg Hips Bilat With Pelvis 2v  Result Date: 10/01/2017 CLINICAL DATA:  Hip pain. EXAM: DG HIP (WITH OR WITHOUT PELVIS) 2V BILAT COMPARISON:  None. FINDINGS: There are degenerative changes in the left hip with an osteophyte off the femoral neck. No significant loss of joint space. No fractures or dislocations identified. No other acute abnormalities. 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  No other abnormalities. Electronically Signed   By: Dorise Bullion III M.D   On: 10/01/2017 12:46    Lab Data:  CBC: Recent Labs  Lab 10/26/17 0448 10/29/17 0827  WBC 11.1* 8.0  HGB 8.8* 8.7*  HCT 28.1* 28.1*  MCV 90.9 90.9  PLT 314 161   Basic Metabolic Panel: Recent Labs  Lab 10/26/17 0448 10/29/17 0827  NA 134* 135  K 4.2 3.7  CL 97* 98*  CO2 27 28  GLUCOSE 97 116*  BUN 5* 5*  CREATININE 0.56* 0.56*  CALCIUM 8.7* 8.6*   GFR: Estimated Creatinine Clearance: 115.8 mL/min (A) (by C-G formula based on SCr of 0.56 mg/dL (L)). Liver Function Tests: No results for input(s): AST, ALT, ALKPHOS, BILITOT, PROT, ALBUMIN in the last 168 hours. No results for input(s): LIPASE, AMYLASE in the last 168 hours. No results  for input(s): AMMONIA in the last 168 hours. Coagulation Profile: No results for input(s): INR, PROTIME in the last 168 hours. Cardiac Enzymes: No results for input(s): CKTOTAL, CKMB, CKMBINDEX, TROPONINI in the last 168 hours. BNP (last 3 results) No results for input(s): PROBNP in the last 8760 hours. HbA1C: No results for input(s): HGBA1C in the last 72 hours. CBG: No results for input(s): GLUCAP in the last 168 hours. Lipid Profile: No results for input(s): CHOL, HDL, LDLCALC, TRIG, CHOLHDL, LDLDIRECT in the last 72 hours. Thyroid Function Tests: No results for input(s): TSH, T4TOTAL, FREET4, T3FREE, THYROIDAB in the last 72 hours. Anemia Panel: No results for input(s): VITAMINB12, FOLATE, FERRITIN, TIBC, IRON, RETICCTPCT in the last 72 hours. Urine analysis:    Component Value Date/Time   COLORURINE YELLOW 10/06/2017 [place] 10/06/2017 1746   LABSPEC 1.017 10/06/2017 1746   PHURINE 7.0 10/06/2017 1746   GLUCOSEU NEGATIVE 10/06/2017 1746   HGBUR MODERATE (A) 10/06/2017 1746   BILIRUBINUR NEGATIVE 10/06/2017 1746   KETONESUR NEGATIVE 10/06/2017 1746   PROTEINUR NEGATIVE 10/06/2017 1746   UROBILINOGEN 0.2 05/16/2012 0847   NITRITE NEGATIVE 10/06/2017 1746   LEUKOCYTESUR NEGATIVE 10/06/2017 1746     Elois Averitt M.D. [facility] 10/30/2017, 1:10 PM  Pager: [phone] Between 7am to 7pm - call Pager - [phone]  After 7pm go to [email or web address] - password [email or web address]  Call night coverage person covering after 7pm

## 2017-10-31 NOTE — Progress Notes (Signed)
Occupational Therapy Treatment Patient Details Name: Gary Hicks MRN: [id number] DOB: 01/08/1986 Today's Date: 10/31/2017    History of present illness Pt is a 32 y/o male with active IV drug use presenting with generalized pain. Family is concerned about seizures due to finding him in fetal position with abnormal eye movements. He reports global polyarthralgia, use of heroin and methamphetamines in the past 24 hours at time of admission. Diagnosed with staph bacteremia, severe sepsis. PMH bacterial endocarditis, heroin abuse, possible Hepatitis C, opiate abuse, polysubstance abuse, seizures.  Pt also with positive right LE DVT on 2/18.  On Lovenox.  Pt is s/p I & D of bilat hips on 3/5.    OT comments  Pt demonstrating functional use of L UE for ADL. He demonstrated use of putty for strengthening and fine motor activities as directed. Pt continues to have some wrist and ulnar side of his hand weakness, but primarily complains of persistent numbness. Pt is eager to go home.  Follow Up Recommendations  No OT follow up    Equipment Recommendations  None recommended by OT    Recommendations for Other Services      Precautions / Restrictions Precautions Precautions: Fall       Mobility Bed Mobility Overal bed mobility: Modified Independent             General bed mobility comments: HOB up  Transfers                      Balance Overall balance assessment: Needs assistance   Sitting balance-Leahy Scale: Good                                     ADL either performed or assessed with clinical judgement   ADL Overall ADL's : Independent                                       General ADL Comments: pt demonstrating 4/5 wrist strength, 4-/5 grip strength, weaker on ulnar side of hand. Pt demonstrating knowledge of putty exercises and is using his L hand functionally during ADL.      Vision       Perception     Praxis       Cognition Arousal/Alertness: Awake/alert Behavior During Therapy: Flat affect Overall Cognitive Status: Within Functional Limits for tasks assessed                                          Exercises Hand Exercises Digit Composite Flexion: AROM;5 reps;Left;Supine Composite Extension: AROM;5 reps;Left;Supine Digit Lifts: AROM;5 reps;Left;Supine Opposition: AROM;5 reps;Left;Supine Other Exercises Other Exercises: pt completing theraputty exercises with LUE   Shoulder Instructions       General Comments      Pertinent Vitals/ Pain       Pain Assessment: No/denies pain  Home Living                                          Prior Functioning/Environment              Frequency  Min 2X/week  Progress Toward Goals  OT Goals(current goals can now be found in the care plan section)  Progress towards OT goals: Progressing toward goals  Acute Rehab OT Goals Patient Stated Goal: to go home this week OT Goal Formulation: With patient Time For Goal Achievement: 11/08/17 Potential to Achieve Goals: Good  Plan Discharge plan remains appropriate    Co-evaluation                 AM-PAC PT "6 Clicks" Daily Activity     Outcome Measure   Help from another person eating meals?: None Help from another person taking care of personal grooming?: None Help from another person toileting, which includes using toliet, bedpan, or urinal?: None Help from another person bathing (including washing, rinsing, drying)?: None Help from another person to put on and taking off regular upper body clothing?: None Help from another person to put on and taking off regular lower body clothing?: None 6 Click Score: 24    End of Session    OT Visit Diagnosis: Other symptoms and signs involving the nervous system (R29.898)   Activity Tolerance Patient tolerated treatment well   Patient Left in bed;with call bell/phone within reach   Nurse  Communication          Time: 3500-9381 OT Time Calculation (min): 11 min  Charges: OT General Charges $OT Visit: 1 Visit OT Treatments $Therapeutic Activity: 8-22 mins  10/31/2017 Nestor Lewandowsky, OTR/L Pager: [place], [name] 10/31/2017, 3:50 PM

## 2017-10-31 NOTE — Progress Notes (Signed)
Physical Therapy Treatment Patient Details Name: Gary Hicks MRN: [id number] DOB: April 23, 1986 Today's Date: 10/31/2017    History of Present Illness Pt is a 32 y/o male with active IV drug use presenting with generalized pain. Family is concerned about seizures due to finding him in fetal position with abnormal eye movements. He reports global polyarthralgia, use of heroin and methamphetamines in the past 24 hours at time of admission. Diagnosed with staph bacteremia, severe sepsis. PMH bacterial endocarditis, heroin abuse, possible Hepatitis C, opiate abuse, polysubstance abuse, seizures.  Pt also with positive right LE DVT on 2/18.  On Lovenox.  Pt is s/p I & D of bilat hips on 3/5.     PT Comments    Pt progressing well towards functional mobility goals. Pt ambulated with SPC at a supervision level. He demonstrates safe use of SPC and a mildly antalgic gait due to LLE pain. Frequency updated to 2x/week. Perform stair training next session. Will continue to follow acutely and progress as tolerated.     Follow Up Recommendations  Home health PT;Supervision/Assistance - 24 hour     Equipment Recommendations  Cane    Recommendations for Other Services       Precautions / Restrictions Precautions Precautions: Fall Restrictions Weight Bearing Restrictions: No RLE Weight Bearing: Weight bearing as tolerated LLE Weight Bearing: Weight bearing as tolerated    Mobility  Bed Mobility Overal bed mobility: Modified Independent Bed Mobility: Supine to Sit;Sit to Supine     Supine to sit: Modified independent (Device/Increase time) Sit to supine: Modified independent (Device/Increase time)   General bed mobility comments: increased time  Transfers Overall transfer level: Modified independent Equipment used: None Transfers: Sit to/from Stand Sit to Stand: Modified independent (Device/Increase time)         General transfer comment: increased  time  Ambulation/Gait Ambulation/Gait assistance: Supervision Ambulation Distance (Feet): 1200 Feet Assistive device: Straight cane Gait Pattern/deviations: Step-through pattern;Antalgic;Narrow base of support Gait velocity: decreased Gait velocity interpretation: Below normal speed for age/gender General Gait Details: VCs for increased cadence and to widen BOS   Stairs            Wheelchair Mobility    Modified Rankin (Stroke Patients Only)       Balance Overall balance assessment: Needs assistance Sitting-balance support: Feet supported Sitting balance-Leahy Scale: Good     Standing balance support: During functional activity Standing balance-Leahy Scale: Fair Standing balance comment: pt able to perform unsupported static standing, requires single UE support for balance during ambulation                            Cognition Arousal/Alertness: Awake/alert Behavior During Therapy: Flat affect Overall Cognitive Status: Within Functional Limits for tasks assessed                                        Exercises Hand Exercises Digit Composite Flexion: AROM;5 reps;Left;Supine Composite Extension: AROM;5 reps;Left;Supine Digit Lifts: AROM;5 reps;Left;Supine Opposition: AROM;5 reps;Left;Supine Other Exercises Other Exercises: pt completing theraputty exercises with LUE    General Comments General comments (skin integrity, edema, etc.): VSS      Pertinent Vitals/Pain Pain Assessment: Faces Faces Pain Scale: Hurts little more Pain Location: LLE, generalized Pain Descriptors / Indicators: Aching;Grimacing Pain Intervention(s): Monitored during session;Limited activity within patient's tolerance;Repositioned    Home Living  Prior Function            PT Goals (current goals can now be found in the care plan section) Acute Rehab PT Goals Patient Stated Goal: to go home this week PT Goal Formulation:  With patient/family Time For Goal Achievement: 11/07/17 Potential to Achieve Goals: Good Progress towards PT goals: Progressing toward goals    Frequency    Min 2X/week      PT Plan Frequency needs to be updated    Co-evaluation              AM-PAC PT "6 Clicks" Daily Activity  Outcome Measure  Difficulty turning over in bed (including adjusting bedclothes, sheets and blankets)?: None Difficulty moving from lying on back to sitting on the side of the bed? : None Difficulty sitting down on and standing up from a chair with arms (e.g., wheelchair, bedside commode, etc,.)?: None Help needed moving to and from a bed to chair (including a wheelchair)?: None Help needed walking in hospital room?: None Help needed climbing 3-5 steps with a railing? : A Little 6 Click Score: 23    End of Session Equipment Utilized During Treatment: Gait belt Activity Tolerance: Patient tolerated treatment well Patient left: in bed;with call bell/phone within reach Nurse Communication: Mobility status PT Visit Diagnosis: Unsteadiness on feet (R26.81);Other abnormalities of gait and mobility (R26.89) Pain - Right/Left: Left Pain - part of body: Leg     Time: 1583-0940 PT Time Calculation (min) (ACUTE ONLY): 19 min  Charges:  $Gait Training: 8-22 mins                    G Codes:       Gary Hicks, SPT   Gary Hicks 10/31/2017, 4:51 PM

## 2017-10-31 NOTE — Progress Notes (Signed)
[facility]                                                                              Patient Demographics  Gary Hicks, is a 32 y.o. male, DOB - 09/12/1985, EXH:[id number]  Admit date - 09/11/2017   Admitting Physician Quintella Baton, MD  Outpatient Primary MD for the patient is Patient, [name] Per  Outpatient specialists:   LOS - 50  days   Medical records reviewed and are as summarized below:    Chief Complaint  Patient presents with  . Weakness  . Leg Pain       Brief summary   32 year old male, active IVDA, who presents with generalized pain. Patient was incarcerated and released 3-4 weeks ago. Patient admits using IV methamphetamine as well as heroin in the past 24 hours prior to admission. Reports global polyarthralgias including his bilateral feet, bilateral knees, bilateral hips and shoulders. Also reports fevers and chills generalized myalgias. Pt admitted for further management. 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 Pt was also diagnosed with DVT Right gastrocnemius vein . He was started on Lovenox 2/18.He was also diagnosed with left knee septic infection. He underwent bursectomy and I and D by Dr Percell Miller 2-25.He reported worsening left hip pain 3-02, MRI showed, B/L hipSeptic arthritis and 13 mm fluid collection in the posterior aspect of the left obturator internus muscle most concerning for a small abscess.IR was consulted for arthrocentesis, patient couldn't tolerate procedure. Dr Percell Miller performed I and D of bilateral hip on 3/5.  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  Blood cultures 2/11 grew MSSA, repeat on 2/15 and 3/7 have been negative -2D echo showed large 1.1 cmx 1.8 cm multilobulated highly mobile vegetation on the right ventricular aspect of the  anterior leaflet of the tricuspid valve.  Moderate to severe tricuspid regurgitation. -ID was consulted, recommended continue IV Ancef for 6 weeks from 2/23 till 11/05/17 -Ancef changed to cefepime on 3/21 due to new fevers/HCAP, completed on 3/26.  ID was reconsulted and suspected septic embolization from endocarditis causing pleuritic right-sided chest pain dyspnea, cough, possible H CAP.  Patient also has left-sided pleural effusion that he declined to do thoracentesis. -History of IV drug use, currently on Suboxone, difficult placement hence patient will stay inpatient for the duration of antibiotics -CTA chest negative for PE -Flat affect, denies any specific complaints today    Septic left knee, bilateral hip joints -MRI of the left knee showed 4.8 x0.8x 4 cm fluid collection in the subcutaneous fat anterior to patella tendon with severe surrounding soft tissue edema concerning for septic arthritis. -MRI hip showed septic arthritis and 13 mm fluid collection in the posterior aspect of the left obturator internus muscle concerning for small abscess -Orthopedics was consulted (Dr Percell Miller).  Status post I&D and bursectomy of the left knee prepatellar bursitis on 09/23/17, status post I&D of septic arthritis bilateral hip on 10/04/17 -Cultures intraoperatively negative -MRI brain negative for abscess, Doppler of the left arm  negative for DVT.   Left-sided pleural effusion -Seen on CT scan, moderate, patient has decided not to pursue thoracentesis at all. - Patient has repeatedly declined thoracentesis.  DVT right gastrocnemius vein with superficial saphenous thrombophlebitis -Continue Eliquis.  History of IV drug use -Dr. Damita Dunnings managing Suboxone twice daily -No high-dose IV benzos unless required for seizures, no extra narcotics  Malnutrition -Continue nutritional supplements, dietitian was consulted  Generalized anxiety disorder -Psych was consulted, continue Zoloft 50 mg  daily -May continue Klonopin as needed for 2 weeks and then wean off, not advised for long-term. -Psych was reconsulted for follow-up, recommended increasing the Zoloft and tapering off the Klonopin  Seizures -Likely due to drug use, continue seizure precautions, as needed Ativan  -MRI brain negative for any abscess  Iron deficiency anemia -H&H stable  Thrombocytopenia -Resolved  Hyponatremia, hypokalemia -Resolved  Hepatitis C antibody positive -ID following  Vitamin D deficiency - Continue supplementation  Constipation -Resolved, continue Senokot S, MiraLAX   Code Status: full  DVT Prophylaxis: Apixaban Family Communication: Discussed in detail with the patient, all imaging results, lab results explained to the patient    Disposition Plan: Will stay inpatient for the duration of IV antibiotics, end date 11/05/17, unless change recommended by ID.   No new changes, flat affect.  Time Spent in minutes 15 minutes  Procedures:  I&D of left knee on 09/23/17 I&D of bilateral hip on 10/04/17  Consultants:   Infectious disease Orthopedics  Antimicrobials:   IV Ancef till 11/05/17  Cefepime to complete on 3/26 today   Medications  Scheduled Meds: . apixaban  5 mg Oral BID  . buprenorphine-naloxone  1 tablet Sublingual BID  . famotidine  20 mg Oral Daily  . feeding supplement (ENSURE ENLIVE)  237 mL Oral TID BM  . ferrous sulfate  325 mg Oral BID WC  . hydrocortisone cream   Topical BID  . multivitamin with minerals  1 tablet Oral Daily  . nicotine  14 mg Transdermal Daily  . polyethylene glycol  17 g Oral BID  . senna-docusate  1 tablet Oral BID  . sertraline  50 mg Oral Daily  . sodium chloride flush  10-40 mL Intracatheter Q12H  . Vitamin D (Ergocalciferol)  50,000 Units Oral Q Mon   Continuous Infusions: .  ceFAZolin (ANCEF) IV Stopped (10/31/17 0729)  . lactated ringers 10 mL/hr at 09/23/17 1329  . lactated ringers 10 mL/hr at 10/04/17 1237   PRN  Meds:.acetaminophen, alum & mag hydroxide-simeth, clonazePAM, hydrOXYzine, ondansetron **OR** ondansetron (ZOFRAN) IV, oxyCODONE, sodium chloride, sodium chloride flush, sodium chloride flush, traZODone   Antibiotics   Anti-infectives (From admission, onward)   Start     Dose/Rate Route Frequency Ordered Stop   10/26/17 0600  ceFAZolin (ANCEF) IVPB 2g/100 mL premix     2 g 200 mL/hr over 30 Minutes Intravenous Every 8 hours 10/21/17 1123 11/06/17 0559   10/20/17 1600  ceFEPIme (MAXIPIME) 2 g in sodium chloride 0.9 % 100 mL IVPB     2 g 200 mL/hr over 30 Minutes Intravenous Every 8 hours 10/20/17 1517 10/25/17 1800   09/23/17 0600  ceFAZolin (ANCEF) IVPB 2g/100 mL premix     2 g 200 mL/hr over 30 Minutes Intravenous To Surgery 09/22/17 1956 09/23/17 1436   09/23/17 0600  ceFAZolin (ANCEF) IVPB 2g/100 mL premix  Status:  Discontinued     2 g 200 mL/hr over 30 Minutes Intravenous On call to O.R. 09/22/17 1956 09/22/17 2002   09/15/17 1500  Oritavancin Diphosphate (ORBACTIV) 1,200 mg in dextrose 5 % IVPB     1,200 mg 333.3 mL/hr over 180 Minutes Intravenous Once 09/15/17 1357 09/15/17 2100   09/12/17 1400  ceFAZolin (ANCEF) IVPB 2g/100 mL premix  Status:  Discontinued     2 g 200 mL/hr over 30 Minutes Intravenous Every 8 hours 09/12/17 1300 10/20/17 1517   09/11/17 2100  vancomycin (VANCOCIN) IVPB 750 mg/150 ml premix  Status:  Discontinued     750 mg 150 mL/hr over 60 Minutes Intravenous Every 8 hours 09/11/17 2015 09/12/17 1300        Subjective:   Gary Hicks was seen and examined today.  Denies any specific complaints states he is resting.  No acute issues overnight. Patient denies dizziness, abdominal pain, N/V/D/C, new weakness, numbess, tingling.   Objective:   Vitals:   10/30/17 1634 10/30/17 2131 10/31/17 0515 10/31/17 1039  BP: 103/71 100/68 115/65 120/84  Pulse: 88 88 83 92  Resp: 16 17 18 18   Temp: 98.5 F (36.9 C) 98.1 F (36.7 C) 98.6 F (37 C) 98 F (36.7 C)    TempSrc: Oral Oral Oral Oral  SpO2: 94% 99% 100% 100%  Weight:      Height:        Intake/Output Summary (Last 24 hours) at 10/31/2017 1227 Last data filed at 10/31/2017 0102 Gross per 24 hour  Intake 1140 ml  Output 300 ml  Net 840 ml     Wt Readings from Last 3 Encounters:  10/29/17 61.2 kg (134 lb 14.8 oz)  09/10/17 63.5 kg (140 lb)  01/05/15 63.5 kg (140 lb)     Exam   General: Alert and oriented x 3, NAD  Eyes:   HEENT:    Cardiovascular: RRR No pedal edema b/l  Respiratory: CTAB  Gastrointestinal: Soft, NT  Ext: no pedal edema bilaterally  Neuro: no new deficits  Musculoskeletal: No digital cyanosis, clubbing  Skin: No rashes  Psych:flat affect    Data Reviewed:  I have personally reviewed following labs and imaging studies  Micro Results No results found for this or any previous visit (from the past 240 hour(s)).  Radiology Reports Dg Chest 2 View  Result Date: 10/19/2017 CLINICAL DATA:  New onset of fever. Recent episodes of chest pain. History of endocarditis, substance abuse, current smoker. EXAM: CHEST - 2 VIEW COMPARISON:  Chest x-ray and chest CT scan of October 13, 2017 FINDINGS: The lungs are adequately inflated. There is new airspace opacity in the right lower lung. There is persistent increased density in the left mid and lower lung with obscuration of the left hemidiaphragm. The heart is top-normal in size. The pulmonary vascularity is normal. The right PICC line tip projects over the distal third of the SVC. IMPRESSION: Persistent left lower lobe atelectasis or pneumonia with moderate-sized left pleural effusion. New infiltrate in the right lower lobe compatible with pneumonia. Electronically Signed   By: David  Martinique M.D.   On: 10/19/2017 13:32   Dg Chest 2 View  Result Date: January 13, 202019 CLINICAL DATA:  Chest pain. EXAM: CHEST - 2 VIEW COMPARISON:  Radiograph of October 06, 2017. FINDINGS: The heart size and mediastinal contours are within  normal limits. No pneumothorax is noted. Right lung is clear. Right-sided PICC line is unchanged in position. Stable large left pleural effusion is noted with probable underlying atelectasis or infiltrate. The visualized skeletal structures are unremarkable. IMPRESSION: Stable large left pleural effusion is noted with probable underlying atelectasis or infiltrate. Electronically Signed  By: Marijo Conception, M.D.   On: 09/16/202019 15:53   Ct Angio Chest Pe W Or Wo Contrast  Result Date: 10/15/2017 CLINICAL DATA:  Evaluate for pulmonary embolus. Shortness of breath. EXAM: CT ANGIOGRAPHY CHEST WITH CONTRAST TECHNIQUE: Multidetector CT imaging of the chest was performed using the standard protocol during bolus administration of intravenous contrast. Multiplanar CT image reconstructions and MIPs were obtained to evaluate the vascular anatomy. CONTRAST:  131mL ISOVUE-370 IOPAMIDOL (ISOVUE-370) INJECTION 76% COMPARISON:  Chest CT 09/25/2017. FINDINGS: Cardiovascular: Normal heart size. No pericardial effusion. Normal caliber thoracic aorta. Central venous catheter tip terminates in the superior vena cava. Adequate opacification of the pulmonary arterial system. Motion artifact limits evaluation. No filling defect identified to suggest acute pulmonary embolus. Mediastinum/Nodes: No enlarged axillary, mediastinal or hilar lymphadenopathy. Esophagus is normal in appearance. Lungs/Pleura: Central airways are patent. Endobronchial material within the mid and peripheral right lower lobe bronchi which may represent aspiration. Focal consolidation surrounding the peripheral right lower lobe bronchi. Large layering left pleural effusion. There is consolidation involving the left lower lobe. 6 mm left upper lobe nodule (image 37; series 9), in the location of a prior cyst. 7 mm cystic change right upper lobe peripherally (image 59; series 9) in the area of a prior pulmonary nodule. Previously described right lower lobe nodule is  significantly decreased in size measuring 8 mm (image 107; series 8), previously 13 mm. Upper Abdomen: Reflux of contrast into the hepatic veins. No acute process. Musculoskeletal: No aggressive or acute appearing osseous lesions. Review of the MIP images confirms the above findings. IMPRESSION: 1. No evidence of acute pulmonary embolus. 2. Interval decrease in size of many of the previously described pulmonary nodules, likely secondary to improving infectious/inflammatory process. 3. Endobronchial material within the peripheral right lower lobe bronchi with adjacent consolidation which may represent aspiration. 4. Interval increase in size of large left pleural effusion and left basilar atelectasis. 5. Reflux of contrast into the hepatic veins raising the possibility of right heart insufficiency. Electronically Signed   By: Lovey Newcomer M.D.   On: 10/15/2017 12:24   Mr Hip Right W Wo Contrast  Result Date: 10/02/2017 CLINICAL DATA:  Bilateral hip and back pain.  Left worse than right. EXAM: MRI OF THE LEFT HIP WITHOUT AND WITH CONTRAST TECHNIQUE: Multiplanar, multisequence MR imaging was performed both before and after administration of intravenous contrast. CONTRAST:  51mL MULTIHANCE GADOBENATE DIMEGLUMINE 529 MG/ML IV SOLN COMPARISON:  None. FINDINGS: Bones: No hip fracture, dislocation or avascular necrosis. Marrow edema in the left femoral head and acetabulum. Mild marrow edema in the anterior right acetabulum. Bilateral pericapsular edema and enhancement. No periosteal reaction or bone destruction. No aggressive osseous lesion. Normal sacrum and sacroiliac joints. No SI joint widening or erosive changes. Articular cartilage and labrum Articular cartilage: Partial-thickness bilateral femoral heads and acetabulum, left greater than right. Small femoral inferior marginal osteophytes. Labrum: Grossly intact, but evaluation is limited by lack of intraarticular fluid. Joint or bursal effusion Joint effusion: Large  left hip joint effusion with severe synovitis and enhancement. Small right hip joint effusion with synovitis and enhancement. No SI joint effusion. Bursae:  No bursa formation. Muscles and tendons Muscle edema in the left obturator internus muscle with a 13 mm fluid collection posteriorly within the muscle concerning for a small abscess. Muscle edema and mild enhancement in the adductor musculature bilaterally and right iliacus muscle. Muscle edema and enhancement of bilateral multifidus muscles. Other findings Miscellaneous: No pelvic free fluid. No fluid collection  or hematoma. No inguinal lymphadenopathy. No inguinal hernia. IMPRESSION: 1. Large left hip joint effusion with enhancing synovitis, pericapsular edema and severe marrow edema on either side of the joint most concerning for septic arthritis. 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 2. Small right hip joint effusion with enhancing synovitis and pericapsular edema and enhancement most concerning for septic arthritis. 3. Muscle edema within the adductor musculature bilaterally and multifidus muscles bilaterally most concerning for infectious myositis. 13 mm fluid collection in the posterior aspect of the left obturator internus muscle most concerning for a small abscess. Electronically Signed   By: Kathreen Devoid   On: 10/02/2017 11:36   Mr Hip Left W Wo Contrast  Result Date: 10/02/2017 CLINICAL DATA:  Bilateral hip and back pain.  Left worse than right. EXAM: MRI OF THE LEFT HIP WITHOUT AND WITH CONTRAST TECHNIQUE: Multiplanar, multisequence MR imaging was performed both before and after administration of intravenous contrast. CONTRAST:  60mL MULTIHANCE GADOBENATE DIMEGLUMINE 529 MG/ML IV SOLN COMPARISON:  None. FINDINGS: Bones: No hip fracture, dislocation or avascular necrosis. Marrow edema in the left femoral head and acetabulum. Mild marrow edema in the anterior right acetabulum. Bilateral pericapsular edema and enhancement.  No periosteal reaction or bone destruction. No aggressive osseous lesion. Normal sacrum and sacroiliac joints. No SI joint widening or erosive changes. Articular cartilage and labrum Articular cartilage: Partial-thickness bilateral femoral heads and acetabulum, left greater than right. Small femoral inferior marginal osteophytes. Labrum: Grossly intact, but evaluation is limited by lack of intraarticular fluid. Joint or bursal effusion Joint effusion: Large left hip joint effusion with severe synovitis and enhancement. Small right hip joint effusion with synovitis and enhancement. No SI joint effusion. Bursae:  No bursa formation. Muscles and tendons Muscle edema in the left obturator internus muscle with a 13 mm fluid collection posteriorly within the muscle concerning for a small abscess. Muscle edema and mild enhancement in the adductor musculature bilaterally and right iliacus muscle. Muscle edema and enhancement of bilateral multifidus muscles. Other findings Miscellaneous: No pelvic free fluid. No fluid collection or hematoma. No inguinal lymphadenopathy. No inguinal hernia. IMPRESSION: 1. Large left hip joint effusion with enhancing synovitis, pericapsular edema and severe marrow edema on either side of the joint most concerning for septic arthritis. 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 2. Small right hip joint effusion with enhancing synovitis and pericapsular edema and enhancement most concerning for septic arthritis. 3. Muscle edema within the adductor musculature bilaterally and multifidus muscles bilaterally most concerning for infectious myositis. 13 mm fluid collection in the posterior aspect of the left obturator internus muscle most concerning for a small abscess. Electronically Signed   By: Kathreen Devoid   On: 10/02/2017 11:36   Dg Chest Port 1 View  Result Date: 10/06/2017 CLINICAL DATA:  Fever and left upper chest pain for 1 week. EXAM: PORTABLE CHEST 1 VIEW  COMPARISON:  Chest CTA 09/25/2017 FINDINGS: A right PICC terminates over the high right atrium. The cardiac silhouette is normal in size. Veiling opacity in the left mid and lower hemithorax is consistent with a persistent, small to moderate-sized pleural effusion. Mild parenchymal opacity in the left lung base likely represents atelectasis. No right lung consolidation is seen. Nodular right lung opacities on the prior CT are not clearly apparent on this radiograph. No pneumothorax is identified. No acute osseous abnormality is seen. IMPRESSION: Left pleural effusion and mild left basilar atelectasis. Electronically Signed   By: Seymour Bars.D.  On: 10/06/2017 11:12    Lab Data:  CBC: Recent Labs  Lab 10/26/17 0448 10/29/17 0827  WBC 11.1* 8.0  HGB 8.8* 8.7*  HCT 28.1* 28.1*  MCV 90.9 90.9  PLT 314 621   Basic Metabolic Panel: Recent Labs  Lab 10/26/17 0448 10/29/17 0827  NA 134* 135  K 4.2 3.7  CL 97* 98*  CO2 27 28  GLUCOSE 97 116*  BUN 5* 5*  CREATININE 0.56* 0.56*  CALCIUM 8.7* 8.6*   GFR: Estimated Creatinine Clearance: 115.8 mL/min (A) (by C-G formula based on SCr of 0.56 mg/dL (L)). Liver Function Tests: No results for input(s): AST, ALT, ALKPHOS, BILITOT, PROT, ALBUMIN in the last 168 hours. No results for input(s): LIPASE, AMYLASE in the last 168 hours. No results for input(s): AMMONIA in the last 168 hours. Coagulation Profile: No results for input(s): INR, PROTIME in the last 168 hours. Cardiac Enzymes: No results for input(s): CKTOTAL, CKMB, CKMBINDEX, TROPONINI in the last 168 hours. BNP (last 3 results) No results for input(s): PROBNP in the last 8760 hours. HbA1C: No results for input(s): HGBA1C in the last 72 hours. CBG: No results for input(s): GLUCAP in the last 168 hours. Lipid Profile: No results for input(s): CHOL, HDL, LDLCALC, TRIG, CHOLHDL, LDLDIRECT in the last 72 hours. Thyroid Function Tests: No results for input(s): TSH, T4TOTAL, FREET4,  T3FREE, THYROIDAB in the last 72 hours. Anemia Panel: No results for input(s): VITAMINB12, FOLATE, FERRITIN, TIBC, IRON, RETICCTPCT in the last 72 hours. Urine analysis:    Component Value Date/Time   COLORURINE YELLOW 10/06/2017 [place] 10/06/2017 1746   LABSPEC 1.017 10/06/2017 1746   PHURINE 7.0 10/06/2017 1746   GLUCOSEU NEGATIVE 10/06/2017 1746   HGBUR MODERATE (A) 10/06/2017 1746   BILIRUBINUR NEGATIVE 10/06/2017 1746   KETONESUR NEGATIVE 10/06/2017 1746   PROTEINUR NEGATIVE 10/06/2017 1746   UROBILINOGEN 0.2 05/16/2012 0847   NITRITE NEGATIVE 10/06/2017 1746   LEUKOCYTESUR NEGATIVE 10/06/2017 1746     Ripudeep Rai M.D. [facility] 10/31/2017, 12:27 PM  Pager: [phone] Between 7am to 7pm - call Pager - [phone]  After 7pm go to [email or web address] - password [email or web address]  Call night coverage person covering after 7pm

## 2017-10-31 NOTE — Progress Notes (Signed)
Nutrition Follow-up  DOCUMENTATION CODES:   Not applicable  INTERVENTION:   -Continue Ensure Enlive po TID, each supplement provides 350 kcal and 20 grams of protein  -Continue MVI  NUTRITION DIAGNOSIS:   Increased nutrient needs related to acute illness as evidenced by estimated needs.  Being addressed via snacks  GOAL:   Patient will meet greater than or equal to 90% of their needs  Met  MONITOR:   PO intake, Supplement acceptance, Labs, Weight trends  REASON FOR ASSESSMENT:   Malnutrition Screening Tool    ASSESSMENT:   32 yo male admitted severe sepsis with bacterial endocarditis, MSSA bacteremia. Pt with active IV drug abuse (meth and heroin)  Continues on IV antibiotics which will be complete on 11/05/17  Limited documentation of po intake in system. Pt continues to not eat the hospital food; pt reports family/friends still bringing in food and that pt is eating at least 2 meals per [place]. Pt also reports he is drinking the Ensure Enlive. Noted pt with lots of snacks in room as well  Weight trend relatively stable; no significant weight changes, in fact weight up since last assessment  Labs: reviewed Meds: MVI   Diet Order:  Seizure precautions Diet regular Room service appropriate? Yes; Fluid consistency: Thin  EDUCATION NEEDS:   Education needs have been addressed  Skin:  Skin Assessment: Skin Integrity Issues: Skin Integrity Issues:: Incisions Incisions: knee, hip  Last BM:  10/30/17  Height:   Ht Readings from Last 1 Encounters:  09/23/17 '5\' 9"'  (1.753 m)    Weight:   Wt Readings from Last 1 Encounters:  10/29/17 134 lb 14.8 oz (61.2 kg)    Ideal Body Weight:  72.7 kg  BMI:  Body mass index is 19.92 kg/m.  Estimated Nutritional Needs:   Kcal:  2200-2400 kcals  Protein:  110-120 g  Fluid:  >/= 2 L   Kerman Passey MS, RD, LDN, CNSC [phone] Pager  [phone] Weekend/On-Call Pager

## 2017-11-01 LAB — BASIC METABOLIC PANEL
Anion gap: 10 (ref 5–15)
CALCIUM: 8.7 mg/dL — AB (ref 8.9–10.3)
CHLORIDE: 96 mmol/L — AB (ref 101–111)
CO2: 29 mmol/L (ref 22–32)
CREATININE: 0.5 mg/dL — AB (ref 0.61–1.24)
GFR calc non Af Amer: >60 mL/min (ref >60)
GLUCOSE: 110 mg/dL — AB (ref 65–99)
Potassium: 3.8 mmol/L (ref 3.5–5.1)
Sodium: 135 mmol/L (ref 135–145)

## 2017-11-01 LAB — CBC
HCT: 29.2 % — ABNORMAL LOW (ref 39.0–52.0)
Hemoglobin: 9.2 g/dL — ABNORMAL LOW (ref 13.0–17.0)
MCH: 28.7 pg (ref 26.0–34.0)
MCHC: 31.5 g/dL (ref 30.0–36.0)
MCV: 91 fL (ref 78.0–100.0)
Platelets: 315 10*3/uL (ref 150–400)
RBC: 3.21 MIL/uL — ABNORMAL LOW (ref 4.22–5.81)
RDW: 14.6 % (ref 11.5–15.5)
WBC: 7 10*3/uL (ref 4.0–10.5)

## 2017-11-01 NOTE — Progress Notes (Signed)
[facility]                                                                              Gary Hicks Demographics  Gary Hicks, is a 32 y.o. male, DOB - [birth year], YPP:[id number]  Admit date - 09/11/2017   Admitting Physician Quintella Baton, MD  Outpatient Primary MD for the Gary Hicks is Gary Hicks, [name] Per  Outpatient specialists:   LOS - 51  days   Medical records reviewed and are as summarized below:    Chief Complaint  Gary Hicks presents with  . Weakness  . Leg Pain       Brief summary   32 year old male, active IVDA, who presents with generalized pain. Gary Hicks was incarcerated and released 3-4 weeks ago. Gary Hicks admits using IV methamphetamine as well as heroin in the past 24 hours prior to admission. Reports global polyarthralgias including his bilateral feet, bilateral knees, bilateral hips and shoulders. Also reports fevers and chills generalized myalgias. Pt admitted for further management. 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 Pt was also diagnosed with DVT Right gastrocnemius vein . He was started on Lovenox 2/18.He was also diagnosed with left knee septic infection. He underwent bursectomy and I and D by Dr Percell Miller 2-25.He reported worsening left hip pain 3-02, MRI showed, B/L hipSeptic arthritis and 13 mm fluid collection in the posterior aspect of the left obturator internus muscle most concerning for a small abscess.IR was consulted for arthrocentesis, Gary Hicks couldn't tolerate procedure. Dr Percell Miller performed I and D of bilateral hip on 3/5.  Plan: Pt will complete antibiotics on 11/05/2017.  PICC line will need to be pulled and DC home.  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  Blood cultures 2/11 grew MSSA, repeat on 2/15 and 3/7 have been negative -2D echo showed  large 1.1 cmx 1.8 cm multilobulated highly mobile vegetation on the right ventricular aspect of the anterior leaflet of the tricuspid valve.  Moderate to severe tricuspid regurgitation. -ID was consulted, recommended continue IV Ancef for 6 weeks from 2/23 till 11/05/17 -Ancef changed to cefepime on 3/21 due to new fevers/HCAP, completed on 3/26.  ID was reconsulted and suspected septic embolization from endocarditis causing pleuritic right-sided chest pain dyspnea, cough, possible H CAP.  Gary Hicks also has left-sided pleural effusion that he declined to do thoracentesis. -History of IV drug use, currently on Suboxone, difficult placement hence Gary Hicks will stay inpatient for the duration of antibiotics -CTA chest negative for PE -No complaints    Septic left knee, bilateral hip joints -MRI of the left knee showed 4.8 x0.8x 4 cm fluid collection in the subcutaneous fat anterior to patella tendon with severe surrounding soft tissue edema concerning for septic arthritis. -MRI hip showed septic arthritis and 13 mm fluid collection in the posterior aspect of the left obturator internus muscle concerning for small abscess -Orthopedics was consulted (Dr Percell Miller).  Status post I&D and bursectomy of the left knee prepatellar bursitis on 09/23/17, status post I&D of septic arthritis bilateral hip on  10/04/17 -Cultures intraoperatively negative -MRI brain negative for abscess, Doppler of the left arm negative for DVT.   Left-sided pleural effusion -Seen on CT scan, moderate, Gary Hicks has decided not to pursue thoracentesis at all. - Gary Hicks has repeatedly declined thoracentesis.  DVT right gastrocnemius vein with superficial saphenous thrombophlebitis -Continue Eliquis.  History of IV drug use -Dr. Damita Dunnings managing Suboxone twice daily -No high-dose IV benzos unless required for seizures, no extra narcotics  Malnutrition -Continue nutritional supplements, dietitian was consulted  Generalized anxiety  disorder -Psych was consulted, continue Zoloft 50 mg daily -May continue Klonopin as needed for 2 weeks and then wean off, not advised for long-term. -Psych was reconsulted for follow-up, recommended increasing the Zoloft and tapering off the Klonopin  Seizures -Likely due to drug use, continue seizure precautions, as needed Ativan  -MRI brain negative for any abscess  Iron deficiency anemia -H&H stable  Thrombocytopenia -Resolved  Hyponatremia, hypokalemia -Resolved  Hepatitis C antibody positive  Vitamin D deficiency - Continue supplementation  Constipation -Resolved, continue Senokot S, MiraLAX   Code Status: full  DVT Prophylaxis: Apixaban Family Communication: Discussed in detail with the Gary Hicks, all imaging results, lab results explained to the Gary Hicks    Disposition Plan: Will stay inpatient for the duration of IV antibiotics, end date 11/05/17, unless change recommended by ID.  No complaints, no changes today, will complete antibiotics on 11/05/2017  Time Spent in minutes 15 minutes  Procedures:  I&D of left knee on 09/23/17 I&D of bilateral hip on 10/04/17  Consultants:   Infectious disease Orthopedics  Antimicrobials:   IV Ancef till 11/05/17  Cefepime completed on 3/26   Medications  Scheduled Meds: . apixaban  5 mg Oral BID  . buprenorphine-naloxone  1 tablet Sublingual BID  . famotidine  20 mg Oral Daily  . feeding supplement (ENSURE ENLIVE)  237 mL Oral TID BM  . ferrous sulfate  325 mg Oral BID WC  . hydrocortisone cream   Topical BID  . multivitamin with minerals  1 tablet Oral Daily  . nicotine  14 mg Transdermal Daily  . polyethylene glycol  17 g Oral BID  . senna-docusate  1 tablet Oral BID  . sertraline  50 mg Oral Daily  . sodium chloride flush  10-40 mL Intracatheter Q12H  . Vitamin D (Ergocalciferol)  50,000 Units Oral Q Mon   Continuous Infusions: .  ceFAZolin (ANCEF) IV 2 g (11/01/17 0542)  . lactated ringers 10 mL/hr at  09/23/17 1329  . lactated ringers 10 mL/hr at 10/04/17 1237   PRN Meds:.acetaminophen, alum & mag hydroxide-simeth, clonazePAM, hydrOXYzine, ondansetron **OR** ondansetron (ZOFRAN) IV, oxyCODONE, sodium chloride, sodium chloride flush, sodium chloride flush, traZODone   Antibiotics   Anti-infectives (From admission, onward)   Start     Dose/Rate Route Frequency Ordered Stop   10/26/17 0600  ceFAZolin (ANCEF) IVPB 2g/100 mL premix     2 g 200 mL/hr over 30 Minutes Intravenous Every 8 hours 10/21/17 1123 11/06/17 0559   10/20/17 1600  ceFEPIme (MAXIPIME) 2 g in sodium chloride 0.9 % 100 mL IVPB     2 g 200 mL/hr over 30 Minutes Intravenous Every 8 hours 10/20/17 1517 10/25/17 1800   09/23/17 0600  ceFAZolin (ANCEF) IVPB 2g/100 mL premix     2 g 200 mL/hr over 30 Minutes Intravenous To Surgery 09/22/17 1956 09/23/17 1436   09/23/17 0600  ceFAZolin (ANCEF) IVPB 2g/100 mL premix  Status:  Discontinued     2 g 200 mL/hr over  30 Minutes Intravenous On call to O.R. 09/22/17 1956 09/22/17 2002   09/15/17 1500  Oritavancin Diphosphate (ORBACTIV) 1,200 mg in dextrose 5 % IVPB     1,200 mg 333.3 mL/hr over 180 Minutes Intravenous Once 09/15/17 1357 09/15/17 2100   09/12/17 1400  ceFAZolin (ANCEF) IVPB 2g/100 mL premix  Status:  Discontinued     2 g 200 mL/hr over 30 Minutes Intravenous Every 8 hours 09/12/17 1300 10/20/17 1517   09/11/17 2100  vancomycin (VANCOCIN) IVPB 750 mg/150 ml premix  Status:  Discontinued     750 mg 150 mL/hr over 60 Minutes Intravenous Every 8 hours 09/11/17 2015 09/12/17 1300        Subjective:   Corene Cornea Virgil was seen and examined today.  Denies any complaints, drawing in his sketch book.  No acute issues overnight. Gary Hicks denies dizziness, abdominal pain, N/V/D/C, new weakness, numbess, tingling.   Objective:   Vitals:   10/31/17 1734 10/31/17 2126 11/01/17 0500 11/01/17 1048  BP: 108/62 125/86 121/83 95/69  Pulse: (!) 105 (!) 101 98 (!) 111  Resp: 17 18 18  18   Temp: 98.1 F (36.7 C) 98.4 F (36.9 C) 98 F (36.7 C) 98.4 F (36.9 C)  TempSrc: Oral Oral Oral Oral  SpO2: 100% 99% 100% 98%  Weight:      Height:        Intake/Output Summary (Last 24 hours) at 11/01/2017 1353 Last data filed at 11/01/2017 1000 Gross per 24 hour  Intake 650 ml  Output 0 ml  Net 650 ml     Wt Readings from Last 3 Encounters:  10/29/17 61.2 kg (134 lb 14.8 oz)  09/10/17 63.5 kg (140 lb)  01/05/15 63.5 kg (140 lb)     Exam   General: Alert and oriented x 3, NAD  Eyes:   HEENT:   Cardiovascular: S1 S2 auscultated, Regular rate and rhythm. No pedal edema b/l  Respiratory: Clear to auscultation bilaterally, no wheezing, rales or rhonchi  Gastrointestinal: Soft, nontender, nondistended, + bowel sounds  Ext: no pedal edema bilaterally  Neuro: no new deficits  Musculoskeletal: No digital cyanosis, clubbing  Skin: tattoo  Psych: Normal affect and demeanor, alert and oriented x3    Data Reviewed:  I have personally reviewed following labs and imaging studies  Micro Results No results found for this or any previous visit (from the past 240 hour(s)).  Radiology Reports Dg Chest 2 View  Result Date: 10/19/2017 CLINICAL DATA:  New onset of fever. Recent episodes of chest pain. History of endocarditis, substance abuse, current smoker. EXAM: CHEST - 2 VIEW COMPARISON:  Chest x-ray and chest CT scan of October 13, 2017 FINDINGS: The lungs are adequately inflated. There is new airspace opacity in the right lower lung. There is persistent increased density in the left mid and lower lung with obscuration of the left hemidiaphragm. The heart is top-normal in size. The pulmonary vascularity is normal. The right PICC line tip projects over the distal third of the SVC. IMPRESSION: Persistent left lower lobe atelectasis or pneumonia with moderate-sized left pleural effusion. New infiltrate in the right lower lobe compatible with pneumonia. Electronically Signed    By: David  Martinique M.D.   On: 10/19/2017 13:32   Dg Chest 2 View  Result Date: 2020/12/617 CLINICAL DATA:  Chest pain. EXAM: CHEST - 2 VIEW COMPARISON:  Radiograph of October 06, 2017. FINDINGS: The heart size and mediastinal contours are within normal limits. No pneumothorax is noted. Right lung is clear. Right-sided  PICC line is unchanged in position. Stable large left pleural effusion is noted with probable underlying atelectasis or infiltrate. The visualized skeletal structures are unremarkable. IMPRESSION: Stable large left pleural effusion is noted with probable underlying atelectasis or infiltrate. Electronically Signed   By: Marijo Conception, M.D.   On: 07/26/202019 15:53   Ct Angio Chest Pe W Or Wo Contrast  Result Date: 10/15/2017 CLINICAL DATA:  Evaluate for pulmonary embolus. Shortness of breath. EXAM: CT ANGIOGRAPHY CHEST WITH CONTRAST TECHNIQUE: Multidetector CT imaging of the chest was performed using the standard protocol during bolus administration of intravenous contrast. Multiplanar CT image reconstructions and MIPs were obtained to evaluate the vascular anatomy. CONTRAST:  164mL ISOVUE-370 IOPAMIDOL (ISOVUE-370) INJECTION 76% COMPARISON:  Chest CT 09/25/2017. FINDINGS: Cardiovascular: Normal heart size. No pericardial effusion. Normal caliber thoracic aorta. Central venous catheter tip terminates in the superior vena cava. Adequate opacification of the pulmonary arterial system. Motion artifact limits evaluation. No filling defect identified to suggest acute pulmonary embolus. Mediastinum/Nodes: No enlarged axillary, mediastinal or hilar lymphadenopathy. Esophagus is normal in appearance. Lungs/Pleura: Central airways are patent. Endobronchial material within the mid and peripheral right lower lobe bronchi which may represent aspiration. Focal consolidation surrounding the peripheral right lower lobe bronchi. Large layering left pleural effusion. There is consolidation involving the left lower  lobe. 6 mm left upper lobe nodule (image 37; series 9), in the location of a prior cyst. 7 mm cystic change right upper lobe peripherally (image 59; series 9) in the area of a prior pulmonary nodule. Previously described right lower lobe nodule is significantly decreased in size measuring 8 mm (image 107; series 8), previously 13 mm. Upper Abdomen: Reflux of contrast into the hepatic veins. No acute process. Musculoskeletal: No aggressive or acute appearing osseous lesions. Review of the MIP images confirms the above findings. IMPRESSION: 1. No evidence of acute pulmonary embolus. 2. Interval decrease in size of many of the previously described pulmonary nodules, likely secondary to improving infectious/inflammatory process. 3. Endobronchial material within the peripheral right lower lobe bronchi with adjacent consolidation which may represent aspiration. 4. Interval increase in size of large left pleural effusion and left basilar atelectasis. 5. Reflux of contrast into the hepatic veins raising the possibility of right heart insufficiency. Electronically Signed   By: Lovey Newcomer M.D.   On: 10/15/2017 12:24   Dg Chest Port 1 View  Result Date: 10/06/2017 CLINICAL DATA:  Fever and left upper chest pain for 1 week. EXAM: PORTABLE CHEST 1 VIEW COMPARISON:  Chest CTA 09/25/2017 FINDINGS: A right PICC terminates over the high right atrium. The cardiac silhouette is normal in size. Veiling opacity in the left mid and lower hemithorax is consistent with a persistent, small to moderate-sized pleural effusion. Mild parenchymal opacity in the left lung base likely represents atelectasis. No right lung consolidation is seen. Nodular right lung opacities on the prior CT are not clearly apparent on this radiograph. No pneumothorax is identified. No acute osseous abnormality is seen. IMPRESSION: Left pleural effusion and mild left basilar atelectasis. Electronically Signed   By: Logan Bores M.D.   On: 10/06/2017 11:12     Lab Data:  CBC: Recent Labs  Lab 10/26/17 0448 10/29/17 0827 11/01/17 0448  WBC 11.1* 8.0 7.0  HGB 8.8* 8.7* 9.2*  HCT 28.1* 28.1* 29.2*  MCV 90.9 90.9 91.0  PLT 314 293 235   Basic Metabolic Panel: Recent Labs  Lab 10/26/17 0448 10/29/17 0827 11/01/17 0448  NA 134* 135 135  K  4.2 3.7 3.8  CL 97* 98* 96*  CO2 27 28 29   GLUCOSE 97 116* 110*  BUN 5* 5* <5*  CREATININE 0.56* 0.56* 0.50*  CALCIUM 8.7* 8.6* 8.7*   GFR: Estimated Creatinine Clearance: 115.8 mL/min (A) (by C-G formula based on SCr of 0.5 mg/dL (L)). Liver Function Tests: No results for input(s): AST, ALT, ALKPHOS, BILITOT, PROT, ALBUMIN in the last 168 hours. No results for input(s): LIPASE, AMYLASE in the last 168 hours. No results for input(s): AMMONIA in the last 168 hours. Coagulation Profile: No results for input(s): INR, PROTIME in the last 168 hours. Cardiac Enzymes: No results for input(s): CKTOTAL, CKMB, CKMBINDEX, TROPONINI in the last 168 hours. BNP (last 3 results) No results for input(s): PROBNP in the last 8760 hours. HbA1C: No results for input(s): HGBA1C in the last 72 hours. CBG: No results for input(s): GLUCAP in the last 168 hours. Lipid Profile: No results for input(s): CHOL, HDL, LDLCALC, TRIG, CHOLHDL, LDLDIRECT in the last 72 hours. Thyroid Function Tests: No results for input(s): TSH, T4TOTAL, FREET4, T3FREE, THYROIDAB in the last 72 hours. Anemia Panel: No results for input(s): VITAMINB12, FOLATE, FERRITIN, TIBC, IRON, RETICCTPCT in the last 72 hours. Urine analysis:    Component Value Date/Time   COLORURINE YELLOW 10/06/2017 [place] 10/06/2017 1746   LABSPEC 1.017 10/06/2017 1746   PHURINE 7.0 10/06/2017 1746   GLUCOSEU NEGATIVE 10/06/2017 1746   HGBUR MODERATE (A) 10/06/2017 1746   BILIRUBINUR NEGATIVE 10/06/2017 1746   KETONESUR NEGATIVE 10/06/2017 1746   PROTEINUR NEGATIVE 10/06/2017 1746   UROBILINOGEN 0.2 05/16/2012 0847   NITRITE NEGATIVE  10/06/2017 1746   LEUKOCYTESUR NEGATIVE 10/06/2017 1746     Kaegan Stigler M.D. [facility] 11/01/2017, 1:53 PM  Pager: [phone] Between 7am to 7pm - call Pager - [phone]  After 7pm go to [email or web address] - password [email or web address]  Call night coverage person covering after 7pm

## 2017-11-02 DIAGNOSIS — D649 Anemia, unspecified: Secondary | ICD-10-CM

## 2017-11-02 NOTE — Progress Notes (Signed)
Physical Therapy Treatment Patient Details Name: Gary Hicks MRN: [id number] DOB: Nov 25, 1985 Today's Date: 11/02/2017    History of Present Illness Pt is a 32 y/o male with active IV drug use presenting with generalized pain. Family is concerned about seizures due to finding him in fetal position with abnormal eye movements. He reports global polyarthralgia, use of heroin and methamphetamines in the past 24 hours at time of admission. Diagnosed with staph bacteremia, severe sepsis. PMH bacterial endocarditis, heroin abuse, possible Hepatitis C, opiate abuse, polysubstance abuse, seizures.  Pt also with positive right LE DVT on 2/18.  On Lovenox.  Pt is s/p I & D of bilat hips on 3/5.     PT Comments    Pt progressing well towards functional mobility goals. Pt demonstrates bed mobility and transfers mod I, ambulation supervision, and stair negotiation min guard with SPC. VCs required throughout stair negotiation for proper technique. Pt able to ambulate ~50 ft without SPC, but demonstrates increased instability and antalgic gait pattern without AD. D/c recommendations updated to HHPT with intermittent supervision. Will continue to follow acutely and progress as tolerated.    Follow Up Recommendations  Home health PT;Supervision - Intermittent     Equipment Recommendations  Cane    Recommendations for Other Services       Precautions / Restrictions Precautions Precautions: Fall Restrictions Weight Bearing Restrictions: No RLE Weight Bearing: Weight bearing as tolerated LLE Weight Bearing: Weight bearing as tolerated    Mobility  Bed Mobility Overal bed mobility: Modified Independent Bed Mobility: Supine to Sit;Sit to Supine     Supine to sit: Modified independent (Device/Increase time) Sit to supine: Modified independent (Device/Increase time)   General bed mobility comments: increased time  Transfers Overall transfer level: Modified independent Equipment used:  None Transfers: Sit to/from Stand Sit to Stand: Modified independent (Device/Increase time)         General transfer comment: increased time  Ambulation/Gait Ambulation/Gait assistance: Supervision Ambulation Distance (Feet): 800 Feet Assistive device: Straight cane;None Gait Pattern/deviations: Step-through pattern;Antalgic;Narrow base of support Gait velocity: decreased Gait velocity interpretation: Below normal speed for age/gender General Gait Details: ambulated with [facility] for majority of walk. able to walk ~50 ft without AD with very slow and antalgic gait. VCs for increased cadence, upright posture, and to widen BOS   Stairs Stairs: Yes   Stair Management: One rail Right;Forwards;Alternating pattern;Step to pattern Number of Stairs: 5(x2) General stair comments: VCs and demonstration for pattern and technique. Pt attempted alternating pattern with SPC which caused increased pain in L knee. Instructed him to try step to pattern which improved stability and decreased pain.   Wheelchair Mobility    Modified Rankin (Stroke Patients Only)       Balance Overall balance assessment: Needs assistance Sitting-balance support: Feet supported Sitting balance-Leahy Scale: Good     Standing balance support: During functional activity Standing balance-Leahy Scale: Fair Standing balance comment: pt able to perform unsupported static standing and walk short distances in closed environment, but relied on signle UE support on SPC the majority of the time for balance                            Cognition Arousal/Alertness: Awake/alert Behavior During Therapy: Flat affect Overall Cognitive Status: Within Functional Limits for tasks assessed  Exercises      General Comments General comments (skin integrity, edema, etc.): VSS      Pertinent Vitals/Pain Pain Assessment: Faces Faces Pain Scale: Hurts little  more Pain Location: LLE, generalized Pain Descriptors / Indicators: Aching;Grimacing Pain Intervention(s): Monitored during session;Limited activity within patient's tolerance;Repositioned    Home Living                      Prior Function            PT Goals (current goals can now be found in the care plan section) Acute Rehab PT Goals Patient Stated Goal: to go home this week PT Goal Formulation: With patient/family Time For Goal Achievement: 11/07/17 Potential to Achieve Goals: Good Progress towards PT goals: Progressing toward goals    Frequency    Min 2X/week      PT Plan Discharge plan needs to be updated    Co-evaluation              AM-PAC PT "6 Clicks" Daily Activity  Outcome Measure  Difficulty turning over in bed (including adjusting bedclothes, sheets and blankets)?: None Difficulty moving from lying on back to sitting on the side of the bed? : None Difficulty sitting down on and standing up from a chair with arms (e.g., wheelchair, bedside commode, etc,.)?: None Help needed moving to and from a bed to chair (including a wheelchair)?: None Help needed walking in hospital room?: None Help needed climbing 3-5 steps with a railing? : A Little 6 Click Score: 23    End of Session   Activity Tolerance: Patient tolerated treatment well Patient left: in bed;with call bell/phone within reach Nurse Communication: Mobility status PT Visit Diagnosis: Unsteadiness on feet (R26.81);Other abnormalities of gait and mobility (R26.89) Pain - Right/Left: Left Pain - part of body: Leg     Time: 4628-6381 PT Time Calculation (min) (ACUTE ONLY): 14 min  Charges:                       G Codes:       [name], SPT   [name] 11/02/2017, 4:11 PM

## 2017-11-02 NOTE — Progress Notes (Signed)
PROGRESS NOTE  Gary Hicks NOB:[id number] DOB: Oct 05, 1985 DOA: 09/11/2017 PCP: Patient, [name] Per  HPI/Recap of past 83 hours: 32 year old male, active IVDA, who presents with generalized pain. Patient was incarcerated and released 3-4 weeks ago. Patient admits using IV methamphetamine as well as heroin in the past 24 hours prior to admission. Reports global polyarthralgias including his bilateral feet, bilateral knees, bilateral hips and shoulders. Also reports fevers and chills generalized myalgias. Pt admitted for further management. 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 Pt was also diagnosed with DVT Right gastrocnemius vein . He was started on Lovenox 2/18.He was also diagnosed with left knee septic infection. He underwent bursectomy and I and D by Dr Percell Miller 2-25.He reported worsening left hip pain 3-02, MRI showed, B/L hipSeptic arthritis and 13 mm fluid collection in the posterior aspect of the left obturator internus muscle most concerning for a small abscess.IR was consulted for arthrocentesis, patient couldn't tolerate procedure. Dr Percell Miller performed I and D of bilateral hip on 3/5. Pt will complete antibiotics on 11/05/2017.  PICC line will need to be pulled and DC home.  Today, patient denied any new complaints, denies any worsening shortness of breath, denies chest pain, fever/chills.  Assessment/Plan: Principal Problem:   Right lower lobe pneumonia (HCC) Active Problems:   Heroin abuse (HCC)   Polysubstance abuse (HCC)   Severe sepsis ([place])   Bacteremia due to Staphylococcus aureus   Normocytic anemia   Cigarette smoker   Staphylococcal arthritis of right shoulder (HCC)   Septic infrapatellar bursitis of left knee   Staphylococcal arthritis of left hip (HCC)   Staphylococcal arthritis of right hip (HCC)   Endocarditis of tricuspid valve   Calf abscess   Saphenous vein thrombophlebitis, right   Anxiety   IVDU (intravenous  drug user)   Fever  Tricuspid valve endocarditis with MSSA bacteremia Presented with polyarthralgias, fevers and chills, 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 Blood cultures 2/11 grew MSSA, repeat on 2/15 and 3/7 have been negative 2D echo showed large 1.1 cmx 1.8 cm multilobulated highly mobile vegetation on the right ventricular aspect of the anterior leaflet of the tricuspid valve. Moderate to severe tricuspid regurgitation. ID was consulted, recommended continue IV Ancef for 6 weeks from 2/23 till 11/05/17 ID was reconsulted for suspected septic embolization from endocarditis causing pleuritic right-sided chest pain dyspnea, cough, Vs possible HCAP.  Patient also has left-sided pleural effusion that he declined to do thoracentesis.  Patient was given cefepime on 3/21 due to new fevers/HCAP, completed on 3/26 CTA chest negative for PE History of IV drug use, currently on Suboxone, difficult placement hence patient will stay inpatient for the duration of antibiotic  Septic left knee, bilateral hip joints MRI of the left knee showed 4.8 x0.8x 4 cm fluid collection in the subcutaneous fat anterior to patella tendon with severe surrounding soft tissue edema concerning for septic arthritis. MRI hip showed septic arthritis and 13 mm fluid collection in the posterior aspect of the left obturator internus muscle concerning for small abscess Orthopedics was consulted (Dr Percell Miller). Status post I&D and bursectomy of the left knee prepatellar bursitis on 09/23/17, status post I&D of septic arthritis bilateral hip on 10/04/17 Cultures intraoperatively negative MRI brain negative for abscess, Doppler of the left arm negative for DVT  Left-sided pleural effusion Seen on CT scan, moderate, patient has decided not to pursue thoracentesis at all. Patient has repeatedly declined thoracentesis.  DVT right  gastrocnemius vein with superficial saphenous thrombophlebitis Continue  Eliquis.  History of IV drug use Dr. Damita Dunnings managing Suboxone twice daily No high-dose IV benzos unless required for seizures, no extra narcotics  Malnutrition Continue nutritional supplements, dietitian was consulted  Generalized anxiety disorder Psych was consulted, continue Zoloft 50 mg daily May continue Klonopin as needed for 2 weeks and then wean off, not advised for long-term. Psych was reconsulted for follow-up, recommended increasing the Zoloft and tapering off the Klonopin  Seizures Likely due to drug use, continue seizure precautions, as needed Ativan  MRI brain negative for any abscess  Iron deficiency anemia H&H stable  Thrombocytopenia Resolved  Hyponatremia, hypokalemia Resolved  Hepatitis C antibody positive  Vitamin D deficiency Continue supplementation  Constipation Resolved, continue Senokot S, MiraLAX     Code Status: Full  Family Communication: None at bedside  Disposition Plan: Plan to DC home on 11/05/17   Consultants:  ID  Orthopedics  Procedures: I&D of left knee on 09/23/17 I&D of bilateral hip on 10/04/17  Antimicrobials:  IV Ancef  DVT prophylaxis: Continue Eliquis   Objective: Vitals:   11/01/17 2052 11/02/17 0507 11/02/17 0902 11/02/17 1720  BP: 103/74 (!) 115/92 106/77 95/82  Pulse: (!) 110 97 95 (!) 109  Resp: 18 20 18 18   Temp: 97.7 F (36.5 C) 98 F (36.7 C) 98.4 F (36.9 C) (!) 97.3 F (36.3 C)  TempSrc: Oral Oral Oral Oral  SpO2: 98% 100% 99% 99%  Weight: 61.1 kg (134 lb 11.2 oz)     Height:        Intake/Output Summary (Last 24 hours) at 11/02/2017 1759 Last data filed at 11/02/2017 1521 Gross per 24 hour  Intake 760 ml  Output 0 ml  Net 760 ml   Filed Weights   10/28/17 2125 10/29/17 2100 11/01/17 2052  Weight: 61.2 kg (134 lb 14.7 oz) 61.2 kg (134 lb 14.8 oz) 61.1 kg (134 lb 11.2 oz)    Exam:   General: NAD  Cardiovascular: S1, S2 present  Respiratory: Chest clear to auscultation  bilaterally  Abdomen: Soft, nontender, nondistended, bowel sounds present  Musculoskeletal: No pedal edema bilaterally  Skin: Multiple tattoos noted  Psychiatry: Normal mood   Data Reviewed: CBC: Recent Labs  Lab 10/29/17 0827 11/01/17 0448  WBC 8.0 7.0  HGB 8.7* 9.2*  HCT 28.1* 29.2*  MCV 90.9 91.0  PLT 293 643   Basic Metabolic Panel: Recent Labs  Lab 10/29/17 0827 11/01/17 0448  NA 135 135  K 3.7 3.8  CL 98* 96*  CO2 28 29  GLUCOSE 116* 110*  BUN 5* <5*  CREATININE 0.56* 0.50*  CALCIUM 8.6* 8.7*   GFR: Estimated Creatinine Clearance: 115.6 mL/min (A) (by C-G formula based on SCr of 0.5 mg/dL (L)). Liver Function Tests: No results for input(s): AST, ALT, ALKPHOS, BILITOT, PROT, ALBUMIN in the last 168 hours. No results for input(s): LIPASE, AMYLASE in the last 168 hours. No results for input(s): AMMONIA in the last 168 hours. Coagulation Profile: No results for input(s): INR, PROTIME in the last 168 hours. Cardiac Enzymes: No results for input(s): CKTOTAL, CKMB, CKMBINDEX, TROPONINI in the last 168 hours. BNP (last 3 results) No results for input(s): PROBNP in the last 8760 hours. HbA1C: No results for input(s): HGBA1C in the last 72 hours. CBG: No results for input(s): GLUCAP in the last 168 hours. Lipid Profile: No results for input(s): CHOL, HDL, LDLCALC, TRIG, CHOLHDL, LDLDIRECT in the last 72 hours. Thyroid Function Tests: No results  for input(s): TSH, T4TOTAL, FREET4, T3FREE, THYROIDAB in the last 72 hours. Anemia Panel: No results for input(s): VITAMINB12, FOLATE, FERRITIN, TIBC, IRON, RETICCTPCT in the last 72 hours. Urine analysis:    Component Value Date/Time   COLORURINE YELLOW 10/06/2017 [place] 10/06/2017 1746   LABSPEC 1.017 10/06/2017 1746   PHURINE 7.0 10/06/2017 1746   GLUCOSEU NEGATIVE 10/06/2017 1746   HGBUR MODERATE (A) 10/06/2017 1746   BILIRUBINUR NEGATIVE 10/06/2017 1746   KETONESUR NEGATIVE 10/06/2017  1746   PROTEINUR NEGATIVE 10/06/2017 1746   UROBILINOGEN 0.2 05/16/2012 0847   NITRITE NEGATIVE 10/06/2017 1746   LEUKOCYTESUR NEGATIVE 10/06/2017 1746   Sepsis Labs: @LABRCNTIP (procalcitonin:4,lacticidven:4)  )No results found for this or any previous visit (from the past 240 hour(s)).    Studies: No results found.  Scheduled Meds: . apixaban  5 mg Oral BID  . buprenorphine-naloxone  1 tablet Sublingual BID  . famotidine  20 mg Oral Daily  . feeding supplement (ENSURE ENLIVE)  237 mL Oral TID BM  . ferrous sulfate  325 mg Oral BID WC  . hydrocortisone cream   Topical BID  . multivitamin with minerals  1 tablet Oral Daily  . nicotine  14 mg Transdermal Daily  . polyethylene glycol  17 g Oral BID  . senna-docusate  1 tablet Oral BID  . sertraline  50 mg Oral Daily  . sodium chloride flush  10-40 mL Intracatheter Q12H  . Vitamin D (Ergocalciferol)  50,000 Units Oral Q Mon    Continuous Infusions: .  ceFAZolin (ANCEF) IV Stopped (11/02/17 1521)  . lactated ringers 10 mL/hr at 09/23/17 1329  . lactated ringers 10 mL/hr at 10/04/17 1237     LOS: 52 days     Alma Friendly, MD [facility]   If 7PM-7AM, please contact night-coverage [email or web address] Password [email or web address] 11/02/2017, 5:59 PM

## 2017-11-03 MED ORDER — ACETAMINOPHEN 500 MG PO TABS
1000.0000 mg | ORAL_TABLET | Freq: Four times a day (QID) | ORAL | Status: DC | PRN
  Administered 2017-11-03 – 2017-11-05 (×2): 1000 mg via ORAL
  Filled 2017-11-03 (×2): qty 2

## 2017-11-03 NOTE — Progress Notes (Signed)
Call placed to Practice Administrator "Doris" with [facility] at [phone], follow up appointment made for 11/08/2017 at 9:45 am to follow up regarding: Suboxone/establishing PCP.  Also discussed concern of patient needing Suboxone Prescription prior to discharge on 11/05/2017, as of 11/03/17 no recent rounding note has been entered regarding discharge plan for medication. Per Practice Administrator "Tamela Oddi" she will make sure message regarding rounding/medication to provider.  NCM will continue to follow for discharge transition.  Kristen Cardinal, BSN, RN Nurse Case [facility]

## 2017-11-03 NOTE — Progress Notes (Signed)
Occupational Therapy Treatment and Discharge Patient Details Name: Gary Hicks MRN: [id number] DOB: [birth year] Today's Date: 11/03/2017    History of present illness Pt is a 32 y/o male with active IV drug use presenting with generalized pain. Family is concerned about seizures due to finding him in fetal position with abnormal eye movements. He reports global polyarthralgia, use of heroin and methamphetamines in the past 24 hours at time of admission. Diagnosed with staph bacteremia, severe sepsis. PMH bacterial endocarditis, heroin abuse, possible Hepatitis C, opiate abuse, polysubstance abuse, seizures.  Pt also with positive right LE DVT on 2/18.  On Lovenox.  Pt is s/p I & D of bilat hips on 3/5.    OT comments  Pt is independent in HEP for L UE and functioning independently in ADL. No further OT needs.  Follow Up Recommendations  No OT follow up    Equipment Recommendations  None recommended by OT    Recommendations for Other Services      Precautions / Restrictions Precautions Precautions: Fall Restrictions Weight Bearing Restrictions: No       Mobility Bed Mobility Overal bed mobility: Modified Independent                Transfers                      Balance Overall balance assessment: Needs assistance Sitting-balance support: Feet supported Sitting balance-Leahy Scale: Good                                     ADL either performed or assessed with clinical judgement   ADL                                         General ADL Comments: Issued yellow theraputty and reviewed wrist and hand exercises with pt.     Vision       Perception     Praxis      Cognition Arousal/Alertness: Awake/alert Behavior During Therapy: Flat affect Overall Cognitive Status: Within Functional Limits for tasks assessed                                          Exercises Hand Exercises Digit Composite Flexion:  AROM;5 reps;Left;Supine Composite Extension: AROM;5 reps;Left;Supine Digit Lifts: AROM;5 reps;Left;Supine Opposition: AROM;5 reps;Left;Supine Other Exercises Other Exercises: pt completing theraputty exercises with LUE   Shoulder Instructions       General Comments      Pertinent Vitals/ Pain       Pain Assessment: No/denies pain  Home Living                                          Prior Functioning/Environment              Frequency           Progress Toward Goals  OT Goals(current goals can now be found in the care plan section)  Progress towards OT goals: Goals met/education completed, patient discharged from OT  Acute Rehab OT Goals Patient Stated Goal: to go home this week  Plan All goals met and education completed, patient discharged from OT services    Co-evaluation                 AM-PAC PT "6 Clicks" Daily Activity     Outcome Measure   Help from another person eating meals?: None Help from another person taking care of personal grooming?: None Help from another person toileting, which includes using toliet, bedpan, or urinal?: None Help from another person bathing (including washing, rinsing, drying)?: None Help from another person to put on and taking off regular upper body clothing?: None Help from another person to put on and taking off regular lower body clothing?: None 6 Click Score: 24    End of Session    OT Visit Diagnosis: Other symptoms and signs involving the nervous system (R29.898)   Activity Tolerance Patient tolerated treatment well   Patient Left in bed;with call bell/phone within reach   Nurse Communication          Time: 2897-9150 OT Time Calculation (min): 12 min  Charges: OT General Charges $OT Visit: 1 Visit OT Treatments $Therapeutic Exercise: 8-22 mins  11/03/2017 Gary Hicks, OTR/L Pager: [place], Gary Hicks 11/03/2017, 10:54 AM

## 2017-11-03 NOTE — Progress Notes (Signed)
PROGRESS NOTE  Gary Hicks EGB:[id number] DOB: 1986-04-29 DOA: 09/11/2017 PCP: Patient, [name] Per  HPI/Recap of past 79 hours: 32 year old male, active IVDA, who presents with generalized pain. Patient was incarcerated and released 3-4 weeks ago. Patient admits using IV methamphetamine as well as heroin in the past 24 hours prior to admission. Reports global polyarthralgias including his bilateral feet, bilateral knees, bilateral hips and shoulders. Also reports fevers and chills generalized myalgias. Pt admitted for further management. 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 Pt was also diagnosed with DVT Right gastrocnemius vein . He was started on Lovenox 2/18.He was also diagnosed with left knee septic infection. He underwent bursectomy and I and D by Dr Percell Miller 2-25.He reported worsening left hip pain 3-02, MRI showed, B/L hipSeptic arthritis and 13 mm fluid collection in the posterior aspect of the left obturator internus muscle most concerning for a small abscess.IR was consulted for arthrocentesis, patient couldn't tolerate procedure. Dr Percell Miller performed I and D of bilateral hip on 3/5. Pt will complete antibiotics on 11/05/2017.  PICC line will need to be pulled and DC home.  Today, patient denied any new complaints.  Discussed the need for Suboxone on discharge.  Assessment/Plan: Principal Problem:   Right lower lobe pneumonia (HCC) Active Problems:   Heroin abuse (HCC)   Polysubstance abuse (HCC)   Severe sepsis ([place])   Bacteremia due to Staphylococcus aureus   Normocytic anemia   Cigarette smoker   Staphylococcal arthritis of right shoulder (HCC)   Septic infrapatellar bursitis of left knee   Staphylococcal arthritis of left hip (HCC)   Staphylococcal arthritis of right hip (HCC)   Endocarditis of tricuspid valve   Calf abscess   Saphenous vein thrombophlebitis, right   Anxiety   IVDU (intravenous drug user)    Fever  Tricuspid valve endocarditis with MSSA bacteremia Presented with polyarthralgias, fevers and chills, 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 Blood cultures 2/11 grew MSSA, repeat on 2/15 and 3/7 have been negative 2D echo showed large 1.1 cmx 1.8 cm multilobulated highly mobile vegetation on the right ventricular aspect of the anterior leaflet of the tricuspid valve. Moderate to severe tricuspid regurgitation. ID was consulted, recommended continue IV Ancef for 6 weeks from 2/23 till 11/05/17 ID was reconsulted for suspected septic embolization from endocarditis causing pleuritic right-sided chest pain dyspnea, cough, Vs possible HCAP.  Patient also has left-sided pleural effusion that he declined to do thoracentesis.  Patient was given cefepime on 3/21 due to new fevers/HCAP, completed on 3/26 CTA chest negative for PE History of IV drug use, currently on Suboxone, difficult placement hence patient will stay inpatient for the duration of antibiotic  Septic left knee, bilateral hip joints MRI of the left knee showed 4.8 x0.8x 4 cm fluid collection in the subcutaneous fat anterior to patella tendon with severe surrounding soft tissue edema concerning for septic arthritis. MRI hip showed septic arthritis and 13 mm fluid collection in the posterior aspect of the left obturator internus muscle concerning for small abscess Orthopedics was consulted (Dr Percell Miller). Status post I&D and bursectomy of the left knee prepatellar bursitis on 09/23/17, status post I&D of septic arthritis bilateral hip on 10/04/17 Cultures intraoperatively negative MRI brain negative for abscess, Doppler of the left arm negative for DVT  Left-sided pleural effusion Seen on CT scan, moderate, patient has decided not to pursue thoracentesis at all. Patient has repeatedly declined thoracentesis.  DVT right gastrocnemius vein  with superficial saphenous thrombophlebitis Continue Eliquis.  History of  IV drug use Dr. Damita Dunnings managing Suboxone twice daily No high-dose IV benzos unless required for seizures, no extra narcotics  Malnutrition Continue nutritional supplements, dietitian was consulted  Generalized anxiety disorder Psych was consulted, continue Zoloft 50 mg daily May continue Klonopin as needed for 2 weeks and then wean off, not advised for long-term. Psych was reconsulted for follow-up, recommended increasing the Zoloft and tapering off the Klonopin  Seizures Likely due to drug use, continue seizure precautions, as needed Ativan  MRI brain negative for any abscess  Iron deficiency anemia H&H stable  Thrombocytopenia Resolved  Hyponatremia, hypokalemia Resolved  Hepatitis C antibody positive  Vitamin D deficiency Continue supplementation  Constipation Resolved, continue Senokot S, MiraLAX     Code Status: Full  Family Communication: None at bedside  Disposition Plan: Plan to DC home on 11/05/17   Consultants:  ID  Orthopedics  Procedures: I&D of left knee on 09/23/17 I&D of bilateral hip on 10/04/17  Antimicrobials:  IV Ancef  DVT prophylaxis: Continue Eliquis   Objective: Vitals:   11/02/17 2112 11/03/17 0533 11/03/17 0828 11/03/17 1608  BP: 90/74 118/86 114/86 102/77  Pulse: (!) 115 (!) 102 (!) 115 (!) 107  Resp: 20 19 20 18   Temp: 98.3 F (36.8 C) 97.9 F (36.6 C) 98.2 F (36.8 C) 97.8 F (36.6 C)  TempSrc: Oral Oral Oral Oral  SpO2: 100% 98% 100% 100%  Weight: 61.2 kg (134 lb 14.7 oz)     Height:        Intake/Output Summary (Last 24 hours) at 11/03/2017 1658 Last data filed at 11/03/2017 0837 Gross per 24 hour  Intake 340 ml  Output 0 ml  Net 340 ml   Filed Weights   10/29/17 2100 11/01/17 2052 11/02/17 2112  Weight: 61.2 kg (134 lb 14.8 oz) 61.1 kg (134 lb 11.2 oz) 61.2 kg (134 lb 14.7 oz)    Exam:   General: NAD  Cardiovascular: S1, S2 present  Respiratory: Chest clear to auscultation  bilaterally  Abdomen: Soft, nontender, nondistended, bowel sounds present  Musculoskeletal: No pedal edema bilaterally  Skin: Multiple tattoos noted  Psychiatry: Normal mood   Data Reviewed: CBC: Recent Labs  Lab 10/29/17 0827 11/01/17 0448  WBC 8.0 7.0  HGB 8.7* 9.2*  HCT 28.1* 29.2*  MCV 90.9 91.0  PLT 293 132   Basic Metabolic Panel: Recent Labs  Lab 10/29/17 0827 11/01/17 0448  NA 135 135  K 3.7 3.8  CL 98* 96*  CO2 28 29  GLUCOSE 116* 110*  BUN 5* <5*  CREATININE 0.56* 0.50*  CALCIUM 8.6* 8.7*   GFR: Estimated Creatinine Clearance: 115.8 mL/min (A) (by C-G formula based on SCr of 0.5 mg/dL (L)). Liver Function Tests: No results for input(s): AST, ALT, ALKPHOS, BILITOT, PROT, ALBUMIN in the last 168 hours. No results for input(s): LIPASE, AMYLASE in the last 168 hours. No results for input(s): AMMONIA in the last 168 hours. Coagulation Profile: No results for input(s): INR, PROTIME in the last 168 hours. Cardiac Enzymes: No results for input(s): CKTOTAL, CKMB, CKMBINDEX, TROPONINI in the last 168 hours. BNP (last 3 results) No results for input(s): PROBNP in the last 8760 hours. HbA1C: No results for input(s): HGBA1C in the last 72 hours. CBG: No results for input(s): GLUCAP in the last 168 hours. Lipid Profile: No results for input(s): CHOL, HDL, LDLCALC, TRIG, CHOLHDL, LDLDIRECT in the last 72 hours. Thyroid Function Tests: No results for input(s):  TSH, T4TOTAL, FREET4, T3FREE, THYROIDAB in the last 72 hours. Anemia Panel: No results for input(s): VITAMINB12, FOLATE, FERRITIN, TIBC, IRON, RETICCTPCT in the last 72 hours. Urine analysis:    Component Value Date/Time   COLORURINE YELLOW 10/06/2017 1746   APPEARANCEUR CLEAR 10/06/2017 1746   LABSPEC 1.017 10/06/2017 1746   PHURINE 7.0 10/06/2017 1746   GLUCOSEU NEGATIVE 10/06/2017 1746   HGBUR MODERATE (A) 10/06/2017 1746   BILIRUBINUR NEGATIVE 10/06/2017 1746   KETONESUR NEGATIVE 10/06/2017  1746   PROTEINUR NEGATIVE 10/06/2017 1746   UROBILINOGEN 0.2 05/16/2012 0847   NITRITE NEGATIVE 10/06/2017 1746   LEUKOCYTESUR NEGATIVE 10/06/2017 1746   Sepsis Labs: @LABRCNTIP (procalcitonin:4,lacticidven:4)  )No results found for this or any previous visit (from the past 240 hour(s)).    Studies: No results found.  Scheduled Meds: . apixaban  5 mg Oral BID  . buprenorphine-naloxone  1 tablet Sublingual BID  . famotidine  20 mg Oral Daily  . feeding supplement (ENSURE ENLIVE)  237 mL Oral TID BM  . ferrous sulfate  325 mg Oral BID WC  . hydrocortisone cream   Topical BID  . multivitamin with minerals  1 tablet Oral Daily  . nicotine  14 mg Transdermal Daily  . polyethylene glycol  17 g Oral BID  . senna-docusate  1 tablet Oral BID  . sertraline  50 mg Oral Daily  . sodium chloride flush  10-40 mL Intracatheter Q12H  . Vitamin D (Ergocalciferol)  50,000 Units Oral Q Mon    Continuous Infusions: .  ceFAZolin (ANCEF) IV Stopped (11/03/17 1538)  . lactated ringers 10 mL/hr at 09/23/17 1329  . lactated ringers 10 mL/hr at 10/04/17 1237     LOS: 53 days     Alma Friendly, MD [facility]   If 7PM-7AM, please contact night-coverage [email or web address] Password [facility] 11/03/2017, 4:58 PM

## 2017-11-04 LAB — CBC
HCT: 30.8 % — ABNORMAL LOW (ref 39.0–52.0)
Hemoglobin: 9.4 g/dL — ABNORMAL LOW (ref 13.0–17.0)
MCH: 27.4 pg (ref 26.0–34.0)
MCHC: 30.5 g/dL (ref 30.0–36.0)
MCV: 89.8 fL (ref 78.0–100.0)
PLATELETS: 309 10*3/uL (ref 150–400)
RBC: 3.43 MIL/uL — ABNORMAL LOW (ref 4.22–5.81)
RDW: 14.5 % (ref 11.5–15.5)
WBC: 6.8 10*3/uL (ref 4.0–10.5)

## 2017-11-04 LAB — BASIC METABOLIC PANEL
ANION GAP: 12 (ref 5–15)
BUN: 13 mg/dL (ref 6–20)
CALCIUM: 9.1 mg/dL (ref 8.9–10.3)
CO2: 27 mmol/L (ref 22–32)
Chloride: 97 mmol/L — ABNORMAL LOW (ref 101–111)
Creatinine, Ser: 0.47 mg/dL — ABNORMAL LOW (ref 0.61–1.24)
GFR calc Af Amer: >60 mL/min (ref >60)
GLUCOSE: 106 mg/dL — AB (ref 65–99)
POTASSIUM: 4 mmol/L (ref 3.5–5.1)
SODIUM: 136 mmol/L (ref 135–145)

## 2017-11-04 NOTE — Plan of Care (Signed)
Pt has complained of no pain or distress during my care. He has remained calm and cooperative during my care.

## 2017-11-04 NOTE — Progress Notes (Signed)
4/5 MATCH LETTER generated placed in d/c packet, director also aware.

## 2017-11-04 NOTE — Progress Notes (Signed)
    I followed up with Gary Hicks today, he is discharging from the hospital tomorrow.  He has a severe opioid use disorder that we have been treating with generic Suboxone since 2/14.  He is doing very well on the current dose, cravings well controlled, no withdrawal symptoms.  He wants to continue with medication assisted treatment after discharge which I think is very appropriate.  He is on our schedule to follow-up in [facility] on Tuesday, April 9 at 9:45 AM. I gave him our phone number and directions to the clinic.  I gave him a paper prescription with a 1 week supply of buprenorphine-naloxone 8-2 mg, 1 tablet sublingual twice daily for a 1 week supply.  Patient has no insurance coverage.  In my experience the first week of this medication can be covered under the match letter as a transition medication, I told him he may have to call the phone number on the match letter for a special override.  After that we can work with him to get assistance once he follows up with [place] in the clinic.  I answered all his questions and hope to seeing him next week.   Axel Filler, MD 11/04/2017, 1:21 PM

## 2017-11-04 NOTE — Progress Notes (Signed)
PROGRESS NOTE  Gary Hicks Seeling ZOX:[id number] DOB: [birth year] DOA: 09/11/2017 PCP: Patient, [name] Per  HPI/Recap of past 47 hours: 32 year old male, active IVDA, who presents with generalized pain. Patient was incarcerated and released 3-4 weeks ago. Patient admits using IV methamphetamine as well as heroin in the past 24 hours prior to admission. Reports global polyarthralgias including his bilateral feet, bilateral knees, bilateral hips and shoulders. Also reports fevers and chills generalized myalgias. Pt admitted for further management. 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 Pt was also diagnosed with DVT Right gastrocnemius vein . He was started on Lovenox 2/18.He was also diagnosed with left knee septic infection. He underwent bursectomy and I and D by Dr Percell Miller 2-25.He reported worsening left hip pain 3-02, MRI showed, B/L hipSeptic arthritis and 13 mm fluid collection in the posterior aspect of the left obturator internus muscle most concerning for a small abscess.IR was consulted for arthrocentesis, patient couldn't tolerate procedure. Dr Percell Miller performed I and D of bilateral hip on 3/5. Pt will complete antibiotics on 11/05/2017.  PICC line will need to be pulled and DC home.  Today, patient denied any new complaints.  Discussed the need for Suboxone on discharge, Dr. Damita Dunnings saw patient today and arranged close follow-up and provided scripts for Suboxone on discharge.  Assessment/Plan: Principal Problem:   Right lower lobe pneumonia (HCC) Active Problems:   Heroin abuse (HCC)   Polysubstance abuse (HCC)   Severe sepsis ([facility])   Bacteremia due to Staphylococcus aureus   Normocytic anemia   Cigarette smoker   Staphylococcal arthritis of right shoulder (HCC)   Septic infrapatellar bursitis of left knee   Staphylococcal arthritis of left hip (HCC)   Staphylococcal arthritis of right hip (HCC)   Endocarditis of tricuspid valve   Calf  abscess   Saphenous vein thrombophlebitis, right   Anxiety   IVDU (intravenous drug user)   Fever  Tricuspid valve endocarditis with MSSA bacteremia Presented with polyarthralgias, fevers and chills, 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 Blood cultures 2/11 grew MSSA, repeat on 2/15 and 3/7 have been negative 2D echo showed large 1.1 cmx 1.8 cm multilobulated highly mobile vegetation on the right ventricular aspect of the anterior leaflet of the tricuspid valve. Moderate to severe tricuspid regurgitation. ID was consulted, recommended continue IV Ancef for 6 weeks from 2/23 till 11/05/17 ID was reconsulted for suspected septic embolization from endocarditis causing pleuritic right-sided chest pain dyspnea, cough, Vs possible HCAP.  Patient also has left-sided pleural effusion that he declined to do thoracentesis.  Patient was given cefepime on 3/21 due to new fevers/HCAP, completed on 3/26 CTA chest negative for PE History of IV drug use, currently on Suboxone, difficult placement hence patient will stay inpatient for the duration of antibiotic  Septic left knee, bilateral hip joints MRI of the left knee showed 4.8 x0.8x 4 cm fluid collection in the subcutaneous fat anterior to patella tendon with severe surrounding soft tissue edema concerning for septic arthritis. MRI hip showed septic arthritis and 13 mm fluid collection in the posterior aspect of the left obturator internus muscle concerning for small abscess Orthopedics was consulted (Dr Percell Miller). Status post I&D and bursectomy of the left knee prepatellar bursitis on 09/23/17, status post I&D of septic arthritis bilateral hip on 10/04/17 Cultures intraoperatively negative MRI brain negative for abscess, Doppler of the left arm negative for DVT  Left-sided pleural effusion Seen on CT scan, moderate, patient has decided  not to pursue thoracentesis at all. Patient has repeatedly declined thoracentesis.  DVT right  gastrocnemius vein with superficial saphenous thrombophlebitis Continue Eliquis.  History of IV drug use Dr. Damita Dunnings managing Suboxone twice daily No high-dose IV benzos unless required for seizures, no extra narcotics  Malnutrition Continue nutritional supplements, dietitian was consulted  Generalized anxiety disorder Psych was consulted, continue Zoloft 50 mg daily May continue Klonopin as needed for 2 weeks and then wean off, not advised for long-term. Psych was reconsulted for follow-up, recommended increasing the Zoloft and tapering off the Klonopin  Seizures Likely due to drug use, continue seizure precautions, as needed Ativan  MRI brain negative for any abscess  Iron deficiency anemia H&H stable  Thrombocytopenia Resolved  Hyponatremia, hypokalemia Resolved  Hepatitis C antibody positive  Vitamin D deficiency Continue supplementation  Constipation Resolved, continue Senokot S, MiraLAX     Code Status: Full  Family Communication: None at bedside  Disposition Plan: Plan to DC home on 11/05/17   Consultants:  ID  Orthopedics  Procedures: I&D of left knee on 09/23/17 I&D of bilateral hip on 10/04/17  Antimicrobials:  IV Ancef  DVT prophylaxis: Continue Eliquis   Objective: Vitals:   11/03/17 1608 11/03/17 2132 11/04/17 0459 11/04/17 0741  BP: 102/77 108/77 97/65 100/78  Pulse: (!) 107 (!) 117 (!) 110 (!) 115  Resp: 18   16  Temp: 97.8 F (36.6 C) 97.7 F (36.5 C) 97.9 F (36.6 C) 98.2 F (36.8 C)  TempSrc: Oral Oral Oral Oral  SpO2: 100% 98% 100% 98%  Weight:      Height:        Intake/Output Summary (Last 24 hours) at 11/04/2017 1545 Last data filed at 11/04/2017 0200 Gross per 24 hour  Intake 0 ml  Output 0 ml  Net 0 ml   Filed Weights   10/29/17 2100 11/01/17 2052 11/02/17 2112  Weight: 61.2 kg (134 lb 14.8 oz) 61.1 kg (134 lb 11.2 oz) 61.2 kg (134 lb 14.7 oz)    Exam:   General: NAD  Cardiovascular: S1, S2  present  Respiratory: Chest clear to auscultation bilaterally  Abdomen: Soft, nontender, nondistended, bowel sounds present  Musculoskeletal: No pedal edema bilaterally  Skin: Multiple tattoos noted  Psychiatry: Normal mood   Data Reviewed: CBC: Recent Labs  Lab 10/29/17 0827 11/01/17 0448 11/04/17 0424  WBC 8.0 7.0 6.8  HGB 8.7* 9.2* 9.4*  HCT 28.1* 29.2* 30.8*  MCV 90.9 91.0 89.8  PLT 293 315 614   Basic Metabolic Panel: Recent Labs  Lab 10/29/17 0827 11/01/17 0448 11/04/17 0424  NA 135 135 136  K 3.7 3.8 4.0  CL 98* 96* 97*  CO2 28 29 27   GLUCOSE 116* 110* 106*  BUN 5* <5* 13  CREATININE 0.56* 0.50* 0.47*  CALCIUM 8.6* 8.7* 9.1   GFR: Estimated Creatinine Clearance: 115.8 mL/min (A) (by C-G formula based on SCr of 0.47 mg/dL (L)). Liver Function Tests: No results for input(s): AST, ALT, ALKPHOS, BILITOT, PROT, ALBUMIN in the last 168 hours. No results for input(s): LIPASE, AMYLASE in the last 168 hours. No results for input(s): AMMONIA in the last 168 hours. Coagulation Profile: No results for input(s): INR, PROTIME in the last 168 hours. Cardiac Enzymes: No results for input(s): CKTOTAL, CKMB, CKMBINDEX, TROPONINI in the last 168 hours. BNP (last 3 results) No results for input(s): PROBNP in the last 8760 hours. HbA1C: No results for input(s): HGBA1C in the last 72 hours. CBG: No results for input(s): GLUCAP in  the last 168 hours. Lipid Profile: No results for input(s): CHOL, HDL, LDLCALC, TRIG, CHOLHDL, LDLDIRECT in the last 72 hours. Thyroid Function Tests: No results for input(s): TSH, T4TOTAL, FREET4, T3FREE, THYROIDAB in the last 72 hours. Anemia Panel: No results for input(s): VITAMINB12, FOLATE, FERRITIN, TIBC, IRON, RETICCTPCT in the last 72 hours. Urine analysis:    Component Value Date/Time   COLORURINE YELLOW 10/06/2017 1746   APPEARANCEUR CLEAR 10/06/2017 1746   LABSPEC 1.017 10/06/2017 1746   PHURINE 7.0 10/06/2017 1746   GLUCOSEU  NEGATIVE 10/06/2017 1746   HGBUR MODERATE (A) 10/06/2017 1746   BILIRUBINUR NEGATIVE 10/06/2017 1746   KETONESUR NEGATIVE 10/06/2017 1746   PROTEINUR NEGATIVE 10/06/2017 1746   UROBILINOGEN 0.2 05/16/2012 0847   NITRITE NEGATIVE 10/06/2017 1746   LEUKOCYTESUR NEGATIVE 10/06/2017 1746   Sepsis Labs: @LABRCNTIP (procalcitonin:4,lacticidven:4)  )No results found for this or any previous visit (from the past 240 hour(s)).    Studies: No results found.  Scheduled Meds: . apixaban  5 mg Oral BID  . buprenorphine-naloxone  1 tablet Sublingual BID  . famotidine  20 mg Oral Daily  . feeding supplement (ENSURE ENLIVE)  237 mL Oral TID BM  . ferrous sulfate  325 mg Oral BID WC  . hydrocortisone cream   Topical BID  . multivitamin with minerals  1 tablet Oral Daily  . nicotine  14 mg Transdermal Daily  . polyethylene glycol  17 g Oral BID  . senna-docusate  1 tablet Oral BID  . sertraline  50 mg Oral Daily  . sodium chloride flush  10-40 mL Intracatheter Q12H  . Vitamin D (Ergocalciferol)  50,000 Units Oral Q Mon    Continuous Infusions: .  ceFAZolin (ANCEF) IV 2 g (11/04/17 1402)  . lactated ringers 10 mL/hr at 09/23/17 1329  . lactated ringers 10 mL/hr at 10/04/17 1237     LOS: 54 days     Alma Friendly, MD [facility]   If 7PM-7AM, please contact night-coverage [email or web address] Password [facility] 11/04/2017, 3:45 PM

## 2017-11-05 MED ORDER — POLYETHYLENE GLYCOL 3350 17 G PO PACK: 17.0000 g | PACK | Freq: Every day | ORAL | 0 refills | Status: DC | PRN

## 2017-11-05 MED ORDER — APIXABAN 5 MG PO TABS: 5.0000 mg | ORAL_TABLET | Freq: Two times a day (BID) | ORAL | 0 refills | Status: DC

## 2017-11-05 MED ORDER — HYDROXYZINE HCL 25 MG PO TABS: 25.0000 mg | ORAL_TABLET | Freq: Three times a day (TID) | ORAL | 0 refills | Status: DC | PRN

## 2017-11-05 MED ORDER — BUPRENORPHINE HCL-NALOXONE HCL 8-2 MG SL SUBL: 1.0000 | SUBLINGUAL_TABLET | Freq: Two times a day (BID) | SUBLINGUAL | Status: DC

## 2017-11-05 MED ORDER — VITAMIN D (ERGOCALCIFEROL) 1.25 MG (50000 UNIT) PO CAPS: 50000.0000 [IU] | ORAL_CAPSULE | ORAL | 0 refills | Status: AC

## 2017-11-05 MED ORDER — TRAZODONE HCL 50 MG PO TABS: 50.0000 mg | ORAL_TABLET | Freq: Every evening | ORAL | 0 refills | Status: DC | PRN

## 2017-11-05 MED ORDER — SERTRALINE HCL 50 MG PO TABS: 50.0000 mg | ORAL_TABLET | Freq: Every day | ORAL | 0 refills | Status: DC

## 2017-11-05 MED ORDER — FERROUS SULFATE 325 (65 FE) MG PO TABS: 325.0000 mg | ORAL_TABLET | Freq: Two times a day (BID) | ORAL | 0 refills | Status: DC

## 2017-11-05 MED ORDER — NICOTINE 14 MG/24HR TD PT24: 14.0000 mg | MEDICATED_PATCH | Freq: Every day | TRANSDERMAL | 0 refills | Status: DC

## 2017-11-05 MED ORDER — VITAMIN D (ERGOCALCIFEROL) 1.25 MG (50000 UNIT) PO CAPS: 50000.0000 [IU] | ORAL_CAPSULE | ORAL | 0 refills | Status: DC

## 2017-11-05 NOTE — Progress Notes (Signed)
Patient said he needed cane before discharge, as per PT note he needs cane before discharge, called case manager to check, CM said she is going to put the order and it might take 1-2 hours for the equipment staff member to deliver it, conveyed to the patient and mom, patient was really irritated and agitated, mom asked if he can be discharged now and can come back to pick the cane as they don't want to stay till the cane is delivered.  Checked with the CM, she said she put the order in and ordered the cane.  Within 20-30 min the equipment guy delivered the cane.

## 2017-11-05 NOTE — Progress Notes (Signed)
Patient mother called regarding [facility] being closed, paged Dr. Horris Latino and notified about the situation. Gave mother's ph number when doctor called back and she said she will talk to the mother.

## 2017-11-05 NOTE — Care Management (Signed)
RN called to request cane as patient is being d/c.  Order placed and Jermaine with [facility] notified of request.

## 2017-11-05 NOTE — Progress Notes (Signed)
Patient discharged to home.  Discharge instructions, follow-up appointments, match letter and Eliquis free trial given to the patient, verbalized understanding, mother at bedside listened.  PICC line removed before discharge.  Site clean and intact.  Patient took all his belongings with him.

## 2017-11-05 NOTE — Progress Notes (Signed)
Patient called right at shift change saying the [facility] on [address] is also closed.  Paged Dr. Kennon Holter on call doctor and notified the situation, called back and told to call the other [facility] which he is willing to go and see if they can help him.  Called back the patient and told him to call [facility] on [address], as he was wishing to go there, and check with them if they can transfer the prescriptions.  He appreciated for everything.

## 2017-11-05 NOTE — Discharge Summary (Signed)
Discharge Summary  Gary Hicks GMW:[id number] DOB: 1985/12/23  PCP: Gary Hicks, [name] Per  Admit date: 09/11/2017 Discharge date: 11/05/2017  Time spent: 45 mins  Recommendations for Outpatient Follow-up:  1. PCP with Dr Damita Dunnings (for also suboxone management) 2. ID  Discharge Diagnoses:  Active Hospital Problems   Diagnosis Date Noted  . Right lower lobe pneumonia ([place]) 10/20/2017  . IVDU (intravenous drug user) 10/19/2017  . Fever 10/19/2017  . Anxiety   . Saphenous vein thrombophlebitis, right 09/19/2017  . Calf abscess   . 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 Septic infrapatellar bursitis of left knee   . Staphylococcal arthritis of left hip ([place])   . Staphylococcal arthritis of right hip ([place])   . Endocarditis of tricuspid valve   . Bacteremia due to Staphylococcus aureus 09/11/2017  . Cigarette smoker 09/11/2017  . Normocytic anemia 09/11/2017  . Severe sepsis ([place]) 09/10/2017  . Heroin abuse ([place]) 01/05/2014  . Polysubstance abuse ([place]) 01/05/2014    Resolved Hospital Problems   Diagnosis Date Noted Date Resolved  . Bacteremia 09/11/2017 09/11/2017  . Thrombocytopenia ([place]) 09/11/2017 09/11/2017    Discharge Condition: Stable  Diet recommendation: Heart healthy diet  Vitals:   11/05/17 0503 11/05/17 0949  BP: 94/70 96/65  Pulse: 92 (!) 109  Resp:  14  Temp: 98 F (36.7 C) 98 F (36.7 C)  SpO2: 100% 100%    History of present illness:  32 year old male, active IVDA, who presents with generalized pain. Gary Hicks was incarcerated and released 3-4 weeks ago. Gary Hicks admits using IV methamphetamine as well as heroin in the past 24 hours prior to admission. Reports global polyarthralgias including his bilateral feet, bilateral knees, bilateral hips and shoulders. Also reports fevers and chills generalized myalgias. Pt admitted for further management. 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 Pt was also  diagnosed with DVT Right gastrocnemius vein . He was started on Lovenox 2/18.He was also diagnosed with left knee septic infection. He underwent bursectomy and I and D by Dr Percell Miller 2-25.He reported worsening left hip pain 3-02, MRI showed, B/L hipSeptic arthritis and 13 mm fluid collection in the posterior aspect of the left obturator internus muscle most concerning for a small abscess.IR was consulted for arthrocentesis, Gary Hicks couldn't tolerate procedure. Dr Percell Miller performed I and D of bilateral hip on 3/5. Pt completed antibiotics on 11/05/2017.  Today, Gary Hicks denied any new complaints. Dr. Damita Dunnings arranged close follow-up and provided scripts for Suboxone upon discharge.  Mother and daughter at bedside, grateful for the care given.  Hospital Course:  Principal Problem:   Right lower lobe pneumonia ([place]) Active Problems:   Heroin abuse ([place])   Polysubstance abuse ([place])   Severe sepsis ([place])   Bacteremia due to Staphylococcus aureus   Normocytic anemia   Cigarette smoker   Staphylococcal arthritis of right shoulder ([facility])   Septic infrapatellar bursitis of left knee   Staphylococcal arthritis of left hip ([facility])   Staphylococcal arthritis of right hip ([facility])   Endocarditis of tricuspid valve   Calf abscess   Saphenous vein thrombophlebitis, right   Anxiety   IVDU (intravenous drug user)   Fever  Tricuspid valve endocarditis with MSSA bacteremia Presented with polyarthralgias, fevers and chills, 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 Blood cultures 2/11 grew MSSA, repeat on 2/15 and 3/7 have been negative 2D echo showed large 1.1 cmx 1.8 cm multilobulated highly mobile vegetation on the right ventricular aspect  of the anterior leaflet of the tricuspid valve. Moderate to severe tricuspid regurgitation. ID was consulted, recommended continue IV Ancef for 6 weeks from 2/23 till 11/05/17 ID was reconsulted for suspected septic embolization from endocarditis causing  pleuritic right-sided chest pain dyspnea, cough, Vs possible HCAP. Gary Hicks also has left-sided pleural effusion that he declined to do thoracentesis.  Gary Hicks was given cefepime on 3/21 due to new fevers/HCAP, completed on 3/26 CTA chest negative for PE Follow-up with ID/PCP  Septic left knee, bilateral hip joints MRI of the left knee showed 4.8 x0.8x 4 cm fluid collection in the subcutaneous fat anterior to patella tendon with severe surrounding soft tissue edema concerning for septic arthritis. MRI hip showed septic arthritis and 13 mm fluid collection in the posterior aspect of the left obturator internus muscle concerning for small abscess Orthopedics was consulted (Dr Percell Miller). Status post I&D and bursectomy of the left knee prepatellar bursitis on 09/23/17, status post I&D of septic arthritis bilateral hip on 10/04/17 Cultures intraoperatively negative MRI brain negative for abscess, Doppler of the left arm negative for DVT Follow-up with ID/PCP  Left-sided pleural effusion Repeatedly declined thoracentesis Follow-up with PCP  DVT right gastrocnemius vein with superficial saphenous thrombophlebitis Continue Eliquis for at least 3 months  History of IV drug use Dr. Damita Dunnings managing Suboxone twice daily Follow-up as outpatient  Generalized anxiety disorder Psych was consulted, continue Zoloft 50 mg daily, consider increasing prn  Iron deficiency anemia Continue iron supplements  Hepatitis C antibody positive  Vitamin D deficiency Continue supplementation   Procedures: I&D of left knee on 09/23/17 I&D of bilateral hip on 10/04/17  Consultations:  ID  Orthopedics  Dr. Damita Dunnings for Suboxone management  Discharge Exam: BP 96/65 (BP Location: Left Arm)   Pulse (!) 109   Temp 98 F (36.7 C) (Oral)   Resp 14   Ht 5\' 9"  (1.753 m)   Wt 61.2 kg (134 lb 14.7 oz)   SpO2 100%   BMI 19.92 kg/m   General: NAD Cardiovascular: S1, S2 present Respiratory: Chest clear to  auscultation bilaterally  Discharge Instructions You were cared for by a hospitalist during your hospital stay. If you have any questions about your discharge medications or the care you received while you were in the hospital after you are discharged, you can call the unit and asked to speak with the hospitalist on call if the hospitalist that took care of you is not available. Once you are discharged, your primary care physician will handle any further medical issues. Please note that NO REFILLS for any discharge medications will be authorized once you are discharged, as it is imperative that you return to your primary care physician (or establish a relationship with a primary care physician if you do not have one) for your aftercare needs so that they can reassess your need for medications and monitor your lab values.   Allergies as of 11/05/2017      Reactions   Prednisone Hives   Amoxicillin Hives      Medication List    STOP taking these medications   acetaminophen 500 MG tablet Commonly known as:  TYLENOL   benzonatate 100 MG capsule Commonly known as:  TESSALON   cyclobenzaprine 10 MG tablet Commonly known as:  FLEXERIL   HYDROcodone-acetaminophen 5-325 MG tablet Commonly known as:  NORCO/VICODIN     TAKE these medications   apixaban 5 MG Tabs tablet Commonly known as:  ELIQUIS Take 1 tablet (5 mg total) by mouth 2 (  two) times daily.   buprenorphine-naloxone 8-2 mg Subl SL tablet Commonly known as:  SUBOXONE Place 1 tablet under the tongue 2 (two) times daily.   ferrous sulfate 325 (65 FE) MG tablet Take 1 tablet (325 mg total) by mouth 2 (two) times daily with a meal.   hydrOXYzine 25 MG tablet Commonly known as:  ATARAX/VISTARIL Take 1 tablet (25 mg total) by mouth 3 (three) times daily as needed for anxiety.   ibuprofen 200 MG tablet Commonly known as:  ADVIL,MOTRIN Take 400 mg by mouth every 6 (six) hours as needed for headache (pain). What changed:  Another  medication with the same name was removed. Continue taking this medication, and follow the directions you see here.   nicotine 14 mg/24hr patch Commonly known as:  NICODERM CQ - dosed in mg/24 hours Place 1 patch (14 mg total) onto the skin daily.   polyethylene glycol packet Commonly known as:  MIRALAX / GLYCOLAX Take 17 g by mouth daily as needed.   sertraline 50 MG tablet Commonly known as:  ZOLOFT Take 1 tablet (50 mg total) by mouth daily.   traZODone 50 MG tablet Commonly known as:  DESYREL Take 1 tablet (50 mg total) by mouth at bedtime as needed for sleep.   Vitamin D (Ergocalciferol) 50000 units Caps capsule Commonly known as:  DRISDOL Take 1 capsule (50,000 Units total) by mouth every Monday for 7 doses. Start taking on:  11/07/2017            Durable Medical Equipment  (From admission, onward)        Start     Ordered   11/05/17 1510  For home use only DME Cane  Once     11/05/17 1511     Allergies  Allergen Reactions  . Prednisone Hives  . Amoxicillin Hives   Follow-up Information    [facility]. Go on 11/08/2017.   Why:  AT 9:45 AM to f/u meds and establish PCP. Contact information: [place] [address]  [phone]       Carlyle Basques, MD. Schedule an appointment as soon as possible for a visit in 1 week(s).   Specialty:  Infectious Diseases Contact information: [place] [address] [phone]            The results of significant diagnostics from this hospitalization (including imaging, microbiology, ancillary and laboratory) are listed below for reference.    Significant Diagnostic Studies: Dg Chest 2 View  Result Date: 10/19/2017 CLINICAL DATA:  New onset of fever. Recent episodes of chest pain. History of endocarditis, substance abuse, current smoker. EXAM: CHEST - 2 VIEW COMPARISON:  Chest x-ray and chest CT scan of October 13, 2017 FINDINGS: The lungs are  adequately inflated. There is new airspace opacity in the right lower lung. There is persistent increased density in the left mid and lower lung with obscuration of the left hemidiaphragm. The heart is top-normal in size. The pulmonary vascularity is normal. The right PICC line tip projects over the distal third of the SVC. IMPRESSION: Persistent left lower lobe atelectasis or pneumonia with moderate-sized left pleural effusion. New infiltrate in the right lower lobe compatible with pneumonia. Electronically Signed   By: David  Martinique M.D.   On: 10/19/2017 13:32   Dg Chest 2 View  Result Date: 2020-04-2518 CLINICAL DATA:  Chest pain. EXAM: CHEST - 2 VIEW COMPARISON:  Radiograph of October 06, 2017. FINDINGS: The  heart size and mediastinal contours are within normal limits. No pneumothorax is noted. Right lung is clear. Right-sided PICC line is unchanged in position. Stable large left pleural effusion is noted with probable underlying atelectasis or infiltrate. The visualized skeletal structures are unremarkable. IMPRESSION: Stable large left pleural effusion is noted with probable underlying atelectasis or infiltrate. Electronically Signed   By: Marijo Conception, M.D.   On: November 06, 202019 15:53   Ct Angio Chest Pe W Or Wo Contrast  Result Date: 10/15/2017 CLINICAL DATA:  Evaluate for pulmonary embolus. Shortness of breath. EXAM: CT ANGIOGRAPHY CHEST WITH CONTRAST TECHNIQUE: Multidetector CT imaging of the chest was performed using the standard protocol during bolus administration of intravenous contrast. Multiplanar CT image reconstructions and MIPs were obtained to evaluate the vascular anatomy. CONTRAST:  12mL ISOVUE-370 IOPAMIDOL (ISOVUE-370) INJECTION 76% COMPARISON:  Chest CT 09/25/2017. FINDINGS: Cardiovascular: Normal heart size. No pericardial effusion. Normal caliber thoracic aorta. Central venous catheter tip terminates in the superior vena cava. Adequate opacification of the pulmonary arterial system.  Motion artifact limits evaluation. No filling defect identified to suggest acute pulmonary embolus. Mediastinum/Nodes: No enlarged axillary, mediastinal or hilar lymphadenopathy. Esophagus is normal in appearance. Lungs/Pleura: Central airways are patent. Endobronchial material within the mid and peripheral right lower lobe bronchi which may represent aspiration. Focal consolidation surrounding the peripheral right lower lobe bronchi. Large layering left pleural effusion. There is consolidation involving the left lower lobe. 6 mm left upper lobe nodule (image 37; series 9), in the location of a prior cyst. 7 mm cystic change right upper lobe peripherally (image 59; series 9) in the area of a prior pulmonary nodule. Previously described right lower lobe nodule is significantly decreased in size measuring 8 mm (image 107; series 8), previously 13 mm. Upper Abdomen: Reflux of contrast into the hepatic veins. No acute process. Musculoskeletal: No aggressive or acute appearing osseous lesions. Review of the MIP images confirms the above findings. IMPRESSION: 1. No evidence of acute pulmonary embolus. 2. Interval decrease in size of many of the previously described pulmonary nodules, likely secondary to improving infectious/inflammatory process. 3. Endobronchial material within the peripheral right lower lobe bronchi with adjacent consolidation which may represent aspiration. 4. Interval increase in size of large left pleural effusion and left basilar atelectasis. 5. Reflux of contrast into the hepatic veins raising the possibility of right heart insufficiency. Electronically Signed   By: Lovey Newcomer M.D.   On: 10/15/2017 12:24    Microbiology: No results found for this or any previous visit (from the past 240 hour(s)).   Labs: Basic Metabolic Panel: Recent Labs  Lab 11/01/17 0448 11/04/17 0424  NA 135 136  K 3.8 4.0  CL 96* 97*  CO2 29 27  GLUCOSE 110* 106*  BUN <5* 13  CREATININE 0.50* 0.47*  CALCIUM  8.7* 9.1   Liver Function Tests: No results for input(s): AST, ALT, ALKPHOS, BILITOT, PROT, ALBUMIN in the last 168 hours. No results for input(s): LIPASE, AMYLASE in the last 168 hours. No results for input(s): AMMONIA in the last 168 hours. CBC: Recent Labs  Lab 11/01/17 0448 11/04/17 0424  WBC 7.0 6.8  HGB 9.2* 9.4*  HCT 29.2* 30.8*  MCV 91.0 89.8  PLT 315 309   Cardiac Enzymes: No results for input(s): CKTOTAL, CKMB, CKMBINDEX, TROPONINI in the last 168 hours. BNP: BNP (last 3 results) No results for input(s): BNP in the last 8760 hours.  ProBNP (last 3 results) No results for input(s): PROBNP in the last 8760  hours.  CBG: No results for input(s): GLUCAP in the last 168 hours.     Signed:  Alma Friendly, MD [facility] 11/05/2017, 5:04 PM

## 2017-11-08 ENCOUNTER — Ambulatory Visit (INDEPENDENT_AMBULATORY_CARE_PROVIDER_SITE_OTHER): Payer: Self-pay | Admitting: Internal Medicine

## 2017-11-08 VITALS — BP 114/79 | HR 125 | Temp 98.0°F | Resp 20 | Ht 69.0 in | Wt 130.7 lb

## 2017-11-08 DIAGNOSIS — Z79899 Other long term (current) drug therapy: Secondary | ICD-10-CM

## 2017-11-08 DIAGNOSIS — Z8701 Personal history of pneumonia (recurrent): Secondary | ICD-10-CM

## 2017-11-08 DIAGNOSIS — R768 Other specified abnormal immunological findings in serum: Secondary | ICD-10-CM

## 2017-11-08 DIAGNOSIS — F419 Anxiety disorder, unspecified: Secondary | ICD-10-CM

## 2017-11-08 DIAGNOSIS — R3129 Other microscopic hematuria: Secondary | ICD-10-CM

## 2017-11-08 DIAGNOSIS — Z8679 Personal history of other diseases of the circulatory system: Secondary | ICD-10-CM

## 2017-11-08 DIAGNOSIS — Z86718 Personal history of other venous thrombosis and embolism: Secondary | ICD-10-CM

## 2017-11-08 DIAGNOSIS — Z7901 Long term (current) use of anticoagulants: Secondary | ICD-10-CM

## 2017-11-08 DIAGNOSIS — R011 Cardiac murmur, unspecified: Secondary | ICD-10-CM

## 2017-11-08 DIAGNOSIS — F1121 Opioid dependence, in remission: Secondary | ICD-10-CM

## 2017-11-08 DIAGNOSIS — Z8619 Personal history of other infectious and parasitic diseases: Secondary | ICD-10-CM

## 2017-11-08 DIAGNOSIS — Z8739 Personal history of other diseases of the musculoskeletal system and connective tissue: Secondary | ICD-10-CM

## 2017-11-08 DIAGNOSIS — F1120 Opioid dependence, uncomplicated: Secondary | ICD-10-CM

## 2017-11-08 DIAGNOSIS — R3121 Asymptomatic microscopic hematuria: Secondary | ICD-10-CM

## 2017-11-08 DIAGNOSIS — B1920 Unspecified viral hepatitis C without hepatic coma: Secondary | ICD-10-CM

## 2017-11-08 DIAGNOSIS — I071 Rheumatic tricuspid insufficiency: Secondary | ICD-10-CM

## 2017-11-08 HISTORY — DX: Personal history of other venous thrombosis and embolism: Z86.718

## 2017-11-08 MED ORDER — BUPRENORPHINE HCL-NALOXONE HCL 8-2 MG SL SUBL: 1.0000 | SUBLINGUAL_TABLET | Freq: Two times a day (BID) | SUBLINGUAL | 0 refills | Status: DC

## 2017-11-08 NOTE — Patient Instructions (Signed)
Keep up the good work.   We will get you an appointment in our clinic for hospital follow up.  Increase your zoloft to 75mg  a [place].

## 2017-11-08 NOTE — Progress Notes (Signed)
   11/11/2017  Gary Hicks presents for follow up of opioid use disorder I have reviewed the prior induction visit, follow up visits, and telephone encounters relevant to opiate use disorder (OUD) treatment.   Current daily dose: Suboxone 16mg  daily  Date of Induction: 09/15/17 (Dr Evette Doffing while inpatient)  Current follow up interval, in weeks: 1  The patient has been adherent with the buprenorphine for OUD contract.   Last UDS Result: none  HPI: Ottavio Norem Papesh is a 32 yo male who injects IV drugs who presented to the hospital of 09/11/17 septic from MSSA bacteremia.  He was found to have septic arthritis to multiple joints including bilateral hips as well as knee, inaddition he was noted to have a pneumnonia and tricuspid valve endocarditis, during his hospitalization Dr Evette Doffing was consulted who started him on suboxone for herion withdrawal and MAT.  He was able to complete 6 weeks of IV antibiotics in the hospital and was discharged this past weekend.  He reports he was able to pick up Suboxone and has continued to use it, he has not had any relapse, his cravings are well controlled and he is appreciative for the care he has received.   Exam:  General: resting in NAD, walks with cain, appears to be in pain with movement  Cardiac: RRR, systolic murmur, prominate JVD bilaterally while sitting upright Pulm: grossly clear to auscultation bilaterally Abd: soft, nontender, nondistended, BS present Ext: warm and well perfused, no pedal edema Psych: appropirate affect  Vitals:   11/08/17 1002  BP: 114/79  Pulse: (!) 125  Resp: 20  Temp: 98 F (36.7 C)  TempSrc: Oral  SpO2: 99%  Weight: 130 lb 11.2 oz (59.3 kg)  Height: 5\' 9"  (1.753 m)    Assessment/Plan:  See Problem Based Charting in the Encounters Tab     Lucious Groves, DO  11/11/2017  5:05 PM

## 2017-11-09 LAB — MICROSCOPIC EXAMINATION: RBC MICROSCOPIC, UA: NONE SEEN /HPF (ref 0–2)

## 2017-11-09 LAB — URINALYSIS, ROUTINE W REFLEX MICROSCOPIC
Bilirubin, UA: NEGATIVE
Glucose, UA: NEGATIVE
Leukocytes, UA: NEGATIVE
NITRITE UA: NEGATIVE
PH UA: 5.5 (ref 5.0–7.5)
RBC, UA: NEGATIVE
Specific Gravity, UA: >=1.03 — AB (ref 1.005–1.030)
UUROB: 1 mg/dL (ref 0.2–1.0)

## 2017-11-11 ENCOUNTER — Encounter: Payer: Self-pay | Admitting: Internal Medicine

## 2017-11-11 ENCOUNTER — Ambulatory Visit: Payer: Self-pay | Admitting: Family Medicine

## 2017-11-11 DIAGNOSIS — R768 Other specified abnormal immunological findings in serum: Secondary | ICD-10-CM

## 2017-11-11 DIAGNOSIS — I071 Rheumatic tricuspid insufficiency: Secondary | ICD-10-CM | POA: Insufficient documentation

## 2017-11-11 DIAGNOSIS — R3129 Other microscopic hematuria: Secondary | ICD-10-CM | POA: Insufficient documentation

## 2017-11-11 DIAGNOSIS — Z8679 Personal history of other diseases of the circulatory system: Secondary | ICD-10-CM | POA: Insufficient documentation

## 2017-11-11 DIAGNOSIS — Z8739 Personal history of other diseases of the musculoskeletal system and connective tissue: Secondary | ICD-10-CM | POA: Insufficient documentation

## 2017-11-11 HISTORY — DX: Other specified abnormal immunological findings in serum: R76.8

## 2017-11-11 MED FILL — BUPRENORPHINE HCL-NALOXONE: 8-2 | 7 days supply | Qty: 14 | Fill #0

## 2017-11-11 NOTE — Assessment & Plan Note (Signed)
Hep C Ab positive, follow up RNA quant was <15 however ?RNA was detected. Will need to clarify if this is a negative test or needs to be repeated.

## 2017-11-11 NOTE — Assessment & Plan Note (Signed)
Continue Suboxone 8mg  BID.  Will check UDS today, follow up in 1 week.  Will need to arrange for follow up in our clinic for his multiple medical issues.

## 2017-11-11 NOTE — Assessment & Plan Note (Signed)
May need repair or replacement at some point, prominate JVD on exam.

## 2017-11-11 NOTE — Assessment & Plan Note (Signed)
Was seen by psychiatry during hospitalzation, started on zoloft 50mg , was hesitant to increase while in the hosipital, he is agreeable to increase now.  Increase Zoloft to 75mg  daily

## 2017-11-11 NOTE — Assessment & Plan Note (Signed)
+   UA for hematuria in chart, could not find much additional info in the chart about this.  Given we were obtaining Urine today I repeated a UA, which does not have any signs of hematuria.

## 2017-11-11 NOTE — Assessment & Plan Note (Addendum)
Reviewed chart, was found to have right gastrocneuium dvt on 09/19/17 was discharged on eliquis, was able to get 30 [place] rx filled, may need assistance to complete full 90 days of treatment for provoked distal dvt.

## 2017-11-12 LAB — TOXASSURE SELECT,+ANTIDEPR,UR

## 2017-11-15 ENCOUNTER — Ambulatory Visit: Payer: Self-pay

## 2017-11-17 ENCOUNTER — Inpatient Hospital Stay: Payer: Self-pay | Admitting: Family

## 2017-11-17 NOTE — Progress Notes (Deleted)
Subjective:    Patient ID: Gary Hicks, male    DOB: [birth year], 32 y.o.   MRN: [id number]  No chief complaint on file.   HPI:  Gary Hicks is a 32 y.o. male who presents today for initial office visit following hospitalization for MSSA endocarditis and bacteremia.   Mr. Alfrey was initially admitted to [facility] in February of 2019 with leg weakness and pain with known IVDU.He had been recently incarcerated and used IV methamphetamie and heroin. He was found to have positive blood cultures for MSSA bacteremia, tricuspid valve endocarditis and septic arthritis of his bilateral hips and left kne that was treated with 6 weeks of Ancef in the hospital. Hospital stay was complicated with a right gastronemius DVT and pneumonia.  He completed antibiotics on 11/05/17 and was discharged from the hospital. He has since followed with the Internal Medicine who will be managing his Suboxone for his opioid use disorder. Last blood cultures date 10/19/17 were finalized with no growth.   Allergies  Allergen Reactions  . Prednisone Hives  . Amoxicillin Hives      Outpatient Medications Prior to Visit  Medication Sig Dispense Refill  . apixaban (ELIQUIS) 5 MG TABS tablet Take 1 tablet (5 mg total) by mouth 2 (two) times daily. 60 tablet 0  . buprenorphine-naloxone (SUBOXONE) 8-2 mg SUBL SL tablet Place 1 tablet under the tongue 2 (two) times daily. 14 tablet 0  . ferrous sulfate 325 (65 FE) MG tablet Take 1 tablet (325 mg total) by mouth 2 (two) times daily with a meal. 60 tablet 0  . hydrOXYzine (ATARAX/VISTARIL) 25 MG tablet Take 1 tablet (25 mg total) by mouth 3 (three) times daily as needed for anxiety. 30 tablet 0  . ibuprofen (ADVIL,MOTRIN) 200 MG tablet Take 400 mg by mouth every 6 (six) hours as needed for headache (pain).    . polyethylene glycol (MIRALAX / GLYCOLAX) packet Take 17 g by mouth daily as needed. 14 each 0  . sertraline (ZOLOFT) 50 MG tablet Take 1 tablet (50 mg total) by  mouth daily. 30 tablet 0  . traZODone (DESYREL) 50 MG tablet Take 1 tablet (50 mg total) by mouth at bedtime as needed for sleep. 30 tablet 0  . Vitamin D, Ergocalciferol, (DRISDOL) 50000 units CAPS capsule Take 1 capsule (50,000 Units total) by mouth every Monday for 7 doses. 7 capsule 0   No facility-administered medications prior to visit.      Past Medical History:  Diagnosis Date  . Bacteremia due to Staphylococcus aureus 09/11/2017  . Bacterial endocarditis   . Calf abscess    In setting of IVDU and bactermia  . Heroin abuse ([place])   . History of deep venous thrombosis (DVT) of distal vein of right lower extremity 11/08/2017   Right Gastrocnemius vein dx 09/19/17. Needs 90 days A/C  . Liver disease    possible Hep. C no treatment or confirmation   . Opiate abuse, continuous ([place])   . Pneumonia of right lower lobe due to infectious organism ([place])   . Polysubstance abuse ([place])   . Seizures ([place])   . Septic infrapatellar bursitis of left knee   . Staphylococcal arthritis of left hip ([place])   . Staphylococcal arthritis of right hip ([place])   . Staphylococcal arthritis of right shoulder [facility])       Past Surgical History:  Procedure Laterality Date  . CHOLECYSTECTOMY    . HAND SURGERY    . I&D EXTREMITY  Left 09/23/2017   Procedure: IRRIGATION AND DEBRIDEMENT KNEE;  Surgeon: Renette Butters, MD;  Location: [facility];  Service: Orthopedics;  Laterality: Left;  . INCISION AND DRAINAGE HIP Bilateral 10/04/2017   Procedure: IRRIGATION AND DEBRIDEMENT HIP;  Surgeon: Renette Butters, MD;  Location: [facility];  Service: Orthopedics;  Laterality: Bilateral;      No family history on file.    Social History   Socioeconomic History  . Marital status: Married    Spouse name: Not on file  . Number of children: Not on file  . Years of education: Not on file  . Highest education level: Not on file  Occupational History  . Not on file  Social Needs  . Financial resource strain: Not on file   . Food insecurity:    Worry: Not on file    Inability: Not on file  . Transportation needs:    Medical: Not on file    Non-medical: Not on file  Tobacco Use  . Smoking status: Current Every [place] Smoker    Packs/[place]: 0.50    Types: Cigarettes  . Smokeless tobacco: Current User  Substance and Sexual Activity  . Alcohol use: No  . Drug use: Yes    Types: IV    Comment: heroin  . Sexual activity: Yes  Lifestyle  . Physical activity:    Days per week: Not on file    Minutes per session: Not on file  . Stress: Not on file  Relationships  . Social connections:    Talks on phone: Not on file    Gets together: Not on file    Attends religious service: Not on file    Active member of club or organization: Not on file    Attends meetings of clubs or organizations: Not on file    Relationship status: Not on file  . Intimate partner violence:    Fear of current or ex partner: Not on file    Emotionally abused: Not on file    Physically abused: Not on file    Forced sexual activity: Not on file  Other Topics Concern  . Not on file  Social History Narrative  . Not on file      Review of Systems     Objective:    There were no vitals taken for this visit. Nursing note and vital signs reviewed.  Physical Exam      Assessment & Plan:   Problem List Items Addressed This Visit    None       I am having Luisalberto R. Delahunt maintain his ibuprofen, hydrOXYzine, sertraline, traZODone, polyethylene glycol, apixaban, ferrous sulfate, Vitamin D (Ergocalciferol), and buprenorphine-naloxone.   No orders of the defined types were placed in this encounter.    Follow-up: No follow-ups on file.  Mauricio Po, [facility] Infectious [facility]

## 2017-12-12 ENCOUNTER — Telehealth: Payer: Self-pay | Admitting: General Practice

## 2017-12-12 NOTE — Telephone Encounter (Signed)
Patient is wanting Gary Hicks to callback "OUD"

## 2017-12-13 NOTE — Telephone Encounter (Signed)
Agree, thank you helen

## 2017-12-13 NOTE — Telephone Encounter (Signed)
Rtc, lm for rtc at both #'s

## 2017-12-13 NOTE — Telephone Encounter (Signed)
Pt calls and request an appt in oud, states he needs medicine, last seen 4/9, given 1 week script at that time, scheduled for 5/21 OUD, dr Heber [place] informed

## 2017-12-27 ENCOUNTER — Telehealth: Payer: Self-pay | Admitting: General Practice

## 2017-12-27 NOTE — Telephone Encounter (Signed)
Patient said missed some appt and wants to be seen in OUD

## 2017-12-27 NOTE — Telephone Encounter (Signed)
appt 5/29 at 1300

## 2017-12-27 NOTE — Telephone Encounter (Signed)
Ok to put patient in [facility] slot for tomorrow.

## 2017-12-27 NOTE — Telephone Encounter (Signed)
Called pt back x 2, states phone is acting up, he would like to come to clinic either late today or anytime tomorrow, he was informed that he may be ask to come next Tuesday,he is agreeable

## 2017-12-28 ENCOUNTER — Ambulatory Visit (INDEPENDENT_AMBULATORY_CARE_PROVIDER_SITE_OTHER): Payer: Self-pay | Admitting: Internal Medicine

## 2017-12-28 VITALS — BP 132/88 | HR 120 | Temp 98.2°F | Wt 135.0 lb

## 2017-12-28 DIAGNOSIS — F1121 Opioid dependence, in remission: Secondary | ICD-10-CM

## 2017-12-28 DIAGNOSIS — Z86718 Personal history of other venous thrombosis and embolism: Secondary | ICD-10-CM

## 2017-12-28 MED ORDER — BUPRENORPHINE HCL-NALOXONE HCL 8-2 MG SL SUBL: 1.0000 | SUBLINGUAL_TABLET | Freq: Two times a day (BID) | SUBLINGUAL | 0 refills | Status: DC

## 2017-12-28 MED FILL — BUPRENORPHIN-NALOXON 8-2 MG: 8-2 | 7 days supply | Qty: 14 | Fill #0

## 2017-12-28 NOTE — Patient Instructions (Addendum)
We have refilled your suboxone today.  Please schedule an appointment with our OUD clinic on Tuesday.  Also please schedule an appointment with Bonna Gains to meet about insurance options available to you.

## 2017-12-28 NOTE — Progress Notes (Signed)
CC: suboxone request  HPI:  Gary Hicks is a 32 y.o. male with PMH below.  He is here to address his opioid use disorder and DVT history.  Still smoking 1/2 ppd.  Stopped eliquis 2 weeks ago  Please see A&P for status of the patient's chronic medical conditions  Past Medical History:  Diagnosis Date  . Bacteremia due to Staphylococcus aureus 09/11/2017  . Bacterial endocarditis   . Calf abscess    In setting of IVDU and bactermia  . Heroin abuse ([place])   . History of deep venous thrombosis (DVT) of distal vein of right lower extremity 11/08/2017   Right Gastrocnemius vein dx 09/19/17. Needs 90 days A/C  . Liver disease    possible Hep. C no treatment or confirmation   . Opiate abuse, continuous ([place])   . Pneumonia of right lower lobe due to infectious organism ([place])   . Polysubstance abuse ([place])   . Seizures ([place])   . Septic infrapatellar bursitis of left knee   . 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 Staphylococcal arthritis of right hip ([place])   . Staphylococcal arthritis of right shoulder ([facility])    Review of Systems:  ROS: Pulmonary: pt denies increased work of breathing, shortness of breath,  Cardiac: pt denies palpitations, chest pain,  Abdominal: pt denies abdominal pain, nausea, vomiting, or diarrhea  Physical Exam:  Vitals:   12/28/17 1321  BP: 132/88  Pulse: (!) 120  Temp: 98.2 F (36.8 C)  TempSrc: Oral  SpO2: 100%  Weight: 135 lb (61.2 kg)   Physical Exam  Constitutional: He appears well-developed and well-nourished.  Eyes: Right eye exhibits no discharge. Left eye exhibits no discharge. No scleral icterus.  Neck: JVD (prominent large a waves) present.  Cardiovascular: Regular rhythm and intact distal pulses. Exam reveals no gallop and no friction rub.  Murmur (3/6 TR murmur) heard. Pulmonary/Chest: Effort normal and breath sounds normal. No respiratory distress. He has no wheezes. He has no rales.  Abdominal: Soft. Bowel sounds are normal. He  exhibits no distension and no mass. There is no tenderness. There is no guarding.  Musculoskeletal: He exhibits edema (1+ LE edema bilaterally).  Neurological: He is alert.    Social History   Socioeconomic History  . Marital status: Married    Spouse name: Not on file  . Number of children: Not on file  . Years of education: Not on file  . Highest education level: Not on file  Occupational History  . Not on file  Social Needs  . Financial resource strain: Not on file  . Food insecurity:    Worry: Not on file    Inability: Not on file  . Transportation needs:    Medical: Not on file    Non-medical: Not on file  Tobacco Use  . Smoking status: Current Every [place] Smoker    Packs/Pica: 0.50    Types: Cigarettes  . Smokeless tobacco: Current User  Substance and Sexual Activity  . Alcohol use: No  . Drug use: Yes    Types: IV    Comment: heroin  . Sexual activity: Yes  Lifestyle  . Physical activity:    Days per week: Not on file    Minutes per session: Not on file  . Stress: Not on file  Relationships  . Social connections:    Talks on phone: Not on file    Gets together: Not on file    Attends religious service: Not on  file    Active member of club or organization: Not on file    Attends meetings of clubs or organizations: Not on file    Relationship status: Not on file  . Intimate partner violence:    Fear of current or ex partner: Not on file    Emotionally abused: Not on file    Physically abused: Not on file    Forced sexual activity: Not on file  Other Topics Concern  . Not on file  Social History Narrative  . Not on file    No family history on file.  Assessment & Plan:   See Encounters Tab for problem based charting.  Patient seen with Dr. Angelia Mould
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
## 2017-12-28 NOTE — Assessment & Plan Note (Signed)
He reports recent issues with his wife, knee swelling since hospitalization, suboxone was going well, has been out for 2-3 weeks. Has been using heroin  yesterday last use.  Can't remember why he missed his follow up OUD appointment might have overslept.  Still having some chills every now and then.  Trying to use new needles has access to the syringe exchange.  Doesn't get short of breath while walking up and down stairs.  Has some persistence of LE edema.    -suboxone prescription refilled -pt told to schedule follow up appointment on Tuesday -pt asked to make appointment with [place] to secure some type of insurance.

## 2017-12-29 NOTE — Progress Notes (Signed)
Internal Medicine Clinic Attending  I saw and evaluated the patient.  I personally confirmed the key portions of the history and exam documented by Dr. Shan Levans and I reviewed pertinent patient test results.  The assessment, diagnosis, and plan were formulated together and I agree with the documentation in the resident's note. He will undergo a home reinduction of suboxone, I will have him follow up in our OUD clinic next Tuesday.

## 2018-01-03 ENCOUNTER — Telehealth: Payer: Self-pay | Admitting: General Practice

## 2018-01-03 LAB — TOXASSURE SELECT,+ANTIDEPR,UR

## 2018-01-03 NOTE — Telephone Encounter (Signed)
Patient missed appt, please reschedule and call patient back

## 2018-01-03 NOTE — Telephone Encounter (Signed)
Spoke w/ dr Evette Doffing, will reschedule for Gary Hicks 6/5, pt states he overslept today. ACC at 1315 6/5

## 2018-01-04 ENCOUNTER — Ambulatory Visit (INDEPENDENT_AMBULATORY_CARE_PROVIDER_SITE_OTHER): Payer: Self-pay | Admitting: Student in an Organized Health Care Education/Training Program

## 2018-01-04 ENCOUNTER — Encounter: Payer: Self-pay | Admitting: Internal Medicine

## 2018-01-04 ENCOUNTER — Other Ambulatory Visit: Payer: Self-pay

## 2018-01-04 DIAGNOSIS — F1121 Opioid dependence, in remission: Secondary | ICD-10-CM

## 2018-01-04 MED ORDER — HYDROXYZINE HCL 25 MG PO TABS
25.0000 mg | ORAL_TABLET | Freq: Every evening | ORAL | 2 refills | Status: DC | PRN
Start: 2018-01-04 — End: 2018-02-14

## 2018-01-04 MED ORDER — BUPRENORPHINE HCL-NALOXONE HCL 8-2 MG SL SUBL: 1.0000 | SUBLINGUAL_TABLET | Freq: Two times a day (BID) | SUBLINGUAL | 0 refills | Status: DC

## 2018-01-04 MED FILL — BUPRENORPHIN-NALOXON 8-2 MG: 8-2 | 7 days supply | Qty: 14 | Fill #0

## 2018-01-04 NOTE — Assessment & Plan Note (Signed)
Severe OUD doing well since restarting treatment last week. He reports 16mg  daily is adequate for controlling cravings and withdrawal. Denies relapse. He goes to an NA meeting once a week at [employer]. Has support from his wife. Plan is to continue with generic Suboxone 16mg  daily. Follow up in one week. Can get a random substance test at next visit to ensure compliance.

## 2018-01-04 NOTE — Progress Notes (Signed)
   01/04/2018  Gary Hicks presents for follow up of opioid use disorder I have reviewed the prior induction visit, follow up visits, and telephone encounters relevant to opiate use disorder (OUD) treatment.   Current daily dose: Suboxone 16mg  daily  Date of Induction: 12/28/17  Current follow up interval, in weeks: 20  HPI:  32 year old man here for follow up of severe OUD. 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 He reports his deconditioning is slowly improving. Still gets short of breath with ambulating up stairs. He lives at home with his wife, unable to work right now. He restarted treatment last week after being in relapse for about a month. He reports induction went fine, some withdrawal symptoms for a few hours. Now symptoms are well controlled, denies cravings or any side effects. He had LE edema which he says is now resolved. Still having some pain in his left knee. Denies any other recent triggers, he has told other PWID in his life to stay away for now.  Exam:   Vitals:   01/04/18 1315  BP: 123/77  Pulse: (!) 118  Temp: 98.4 F (36.9 C)  TempSrc: Oral  SpO2: 100%  Weight: 127 lb 11.2 oz (57.9 kg)  Height: 5\' 8"  (1.727 m)    Gen: think and chronically ill appearing young man CV:  Parasternal heave, loud holosystolic murmur that covers S1 and S2 Resp: clear throughout, no murmurs Ext: warm and well perfused, no LE edema, left knee stiff but no effusion or errythema Skin: no rashes or SSI  Assessment/Plan:  See Problem Based Charting in the Encounters Tab     Axel Filler, MD  01/04/2018  1:56 PM

## 2018-01-05 MED FILL — hydrOXYzine HCL 25 MG TABS: 25 | 30 days supply | Qty: 30 | Fill #0

## 2018-01-06 ENCOUNTER — Encounter: Payer: Self-pay | Admitting: General Practice

## 2018-01-06 ENCOUNTER — Ambulatory Visit: Payer: Self-pay

## 2018-01-10 ENCOUNTER — Ambulatory Visit (INDEPENDENT_AMBULATORY_CARE_PROVIDER_SITE_OTHER): Payer: Self-pay | Admitting: Student in an Organized Health Care Education/Training Program

## 2018-01-10 DIAGNOSIS — A6000 Herpesviral infection of urogenital system, unspecified: Secondary | ICD-10-CM | POA: Insufficient documentation

## 2018-01-10 DIAGNOSIS — F1121 Opioid dependence, in remission: Secondary | ICD-10-CM

## 2018-01-10 DIAGNOSIS — A6001 Herpesviral infection of penis: Secondary | ICD-10-CM

## 2018-01-10 MED ORDER — VALACYCLOVIR HCL 1 G PO TABS: 1000.0000 mg | ORAL_TABLET | Freq: Every day | ORAL | 2 refills | Status: AC

## 2018-01-10 MED ORDER — BUPRENORPHINE HCL-NALOXONE HCL 8-2 MG SL SUBL: 1.0000 | SUBLINGUAL_TABLET | Freq: Two times a day (BID) | SUBLINGUAL | 0 refills | Status: DC

## 2018-01-10 MED FILL — valACYclovir HCL 1 GM TABS: 1 | 5 days supply | Qty: 5 | Fill #0

## 2018-01-10 MED FILL — BUPRENORPHINE HCL-NALOXONE: 8-2 | 7 days supply | Qty: 14 | Fill #0

## 2018-01-10 NOTE — Progress Notes (Signed)
   01/10/2018  Gary Hicks Terry presents for follow up of opioid use disorder I have reviewed the prior induction visit, follow up visits, and telephone encounters relevant to opiate use disorder (OUD) treatment.   Current daily dose: Suboxone 16 mg daily  Date of Induction: 12/28/17  Current follow up interval, in weeks: 1  The patient has been adherent with the buprenorphine for OUD contract.   Last UDS Result: Polysubstances present  HPI: 32 year old man here for follow-up of severe opioid use disorder.  Last saw him 1 week ago, his last injection drug use was in late May.  We reinitiated Suboxone about 2 weeks ago he reports doing very well.  He reports good compliance with the medication.  Denies adverse side effects.  Denies any injection drug use in the last week.  Reports that his cravings are well controlled.  Lives at home with his wife, seems like he has a fair amount of social support.  Says that he is getting mostly bored during the [place].  Still very deconditioned from his recent hospitalization.  Very low exertional capacity.  Still having a lot of left-sided knee pain from the septic arthritis.  No new fevers or chills, no shortness of breath with lying, no chest pain.  He reports keeping his Suboxone in a safe place, denies any kids in the house.  Exam:   Vitals:   01/10/18 0956  BP: 101/65  Pulse: 99  Resp: 20  Temp: 98.1 F (36.7 C)  TempSrc: Oral  SpO2: 100%  Weight: 130 lb 9.6 oz (59.2 kg)    General: Chronically ill-appearing young man, thin, no distress Cardiovascular: Regular rate and rhythm, precordial heave, holosystolic murmur at the left lower sternal border Lungs:  unlabored, clear throughout Extremities: Warm and well-perfused, mild prepatellar erythema but no effusion or bursitis Skin no rashes, heavily tattooed, no skin or soft tissue infections  Assessment/Plan:  See Problem Based Charting in the Encounters Tab     Axel Filler,  MD  01/10/2018  10:16 AM

## 2018-01-10 NOTE — Assessment & Plan Note (Signed)
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  Has done well with Valtrex dosing in the past.  Plan is to start Valtrex 1 g daily for 5 days.  I gave him 2 refills for future episodes.

## 2018-01-10 NOTE — Assessment & Plan Note (Signed)
Severe opioid use disorder, too early to tell if it is well controlled right now.  He reports clients, no relapses, and controlled cravings.  Still shows a lot of physical signs of his recent episode of tricuspid valve endocarditis with right heart failure.  Seems to be very slow from recovering, still has a lot of deconditioning.  Plan is to check a urine substance screen today to ensure compliance.  We will continue with Suboxone 8 mg tablets twice daily, he is paying out of pocket for this through the [facility] outpatient pharmacy. close follow-up in 1 week.

## 2018-01-10 NOTE — Patient Instructions (Signed)
Keep up the good work, keep taking your medications as prescribed and call us if you have any problems.  Urgent prescription for a 5-[place] supply of Valtrex.  There are 2 refills for future episodes if they occur.

## 2018-01-18 LAB — TOXASSURE SELECT,+ANTIDEPR,UR

## 2018-01-24 ENCOUNTER — Ambulatory Visit (INDEPENDENT_AMBULATORY_CARE_PROVIDER_SITE_OTHER): Payer: Self-pay | Admitting: Student in an Organized Health Care Education/Training Program

## 2018-01-24 DIAGNOSIS — Z8619 Personal history of other infectious and parasitic diseases: Secondary | ICD-10-CM

## 2018-01-24 DIAGNOSIS — Z8679 Personal history of other diseases of the circulatory system: Secondary | ICD-10-CM

## 2018-01-24 DIAGNOSIS — F1121 Opioid dependence, in remission: Secondary | ICD-10-CM

## 2018-01-24 DIAGNOSIS — L818 Other specified disorders of pigmentation: Secondary | ICD-10-CM

## 2018-01-24 DIAGNOSIS — F1120 Opioid dependence, uncomplicated: Secondary | ICD-10-CM

## 2018-01-24 DIAGNOSIS — Z6379 Other stressful life events affecting family and household: Secondary | ICD-10-CM

## 2018-01-24 MED ORDER — BUPRENORPHINE HCL-NALOXONE HCL 8-2 MG SL SUBL: 1.0000 | SUBLINGUAL_TABLET | Freq: Two times a day (BID) | SUBLINGUAL | 0 refills | Status: DC

## 2018-01-24 MED FILL — BUPRENORPHINE HCL-NALOXONE: 8-2 | 7 days supply | Qty: 14 | Fill #0
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
## 2018-01-24 NOTE — Patient Instructions (Addendum)
We set a goal of starting back on the Suboxone today, taking the medicine every [place] yourself as prescribed. And we want you to go to an [place] meeting this week. We will see you back in one week to check on your progress. Here are some resources you can also consider if we find that suboxone is not enough to get this under control.   [facility] [phone] [place], [place], [place] [phone] [address] [place], [place]  GC Stop  Syringe exchange, assistance with treatment [phone]

## 2018-01-24 NOTE — Assessment & Plan Note (Signed)
Currently uncontrolled, off treatment. He has started and stopped treatment a few times, we are having trouble getting him stable. I advised if the suboxone is not working, we should refer him to a higher level of care like methadone, unfortunately this is probably not an option for him because of limited financial resources. He really wants to try suboxone treatment again. He is incredibly high risk for a poor outcome if he continues to inject because of history of endocarditis. I am going to try inducing him again with suboxone 8-2mg  bid. I prescribed a one week supply. Follow up in one week, urine substance test then to ensure compliance.

## 2018-01-31 ENCOUNTER — Telehealth: Payer: Self-pay

## 2018-01-31 ENCOUNTER — Ambulatory Visit: Payer: Self-pay

## 2018-01-31 ENCOUNTER — Encounter: Payer: Self-pay | Admitting: General Practice

## 2018-01-31 NOTE — Telephone Encounter (Signed)
Returned call to patient. States he missed today's appt because he overslept. Placed on [facility] schedule today at 3 per Dr. Heber . Patient will call back if unable to keep this appt 2/2 transportation issues. Hubbard Hartshorn, RN, BSN

## 2018-01-31 NOTE — Telephone Encounter (Signed)
Pt needs to speak with a nurse about getting into the [place] clinic. Please call back.

## 2018-01-31 NOTE — Telephone Encounter (Signed)
Patient no showed both AM and PM appts today. Hubbard Hartshorn, RN, BSN

## 2018-02-07 ENCOUNTER — Other Ambulatory Visit: Payer: Self-pay

## 2018-02-07 ENCOUNTER — Ambulatory Visit (INDEPENDENT_AMBULATORY_CARE_PROVIDER_SITE_OTHER): Payer: Self-pay | Admitting: Student in an Organized Health Care Education/Training Program

## 2018-02-07 ENCOUNTER — Encounter: Payer: Self-pay | Admitting: Internal Medicine

## 2018-02-07 VITALS — BP 123/75 | HR 111 | Temp 98.2°F | Ht 68.0 in | Wt 138.2 lb

## 2018-02-07 DIAGNOSIS — F1120 Opioid dependence, uncomplicated: Secondary | ICD-10-CM

## 2018-02-07 DIAGNOSIS — F1121 Opioid dependence, in remission: Secondary | ICD-10-CM

## 2018-02-07 MED ORDER — BUPRENORPHINE HCL-NALOXONE HCL 8-2 MG SL SUBL: 1.0000 | SUBLINGUAL_TABLET | Freq: Two times a day (BID) | SUBLINGUAL | 0 refills | Status: DC

## 2018-02-07 MED FILL — BUPRENORPHINE HCL-NALOXONE: 8-2 | 7 days supply | Qty: 14 | Fill #0

## 2018-02-07 NOTE — Patient Instructions (Signed)
We talked frankly today about your progress, I am worried that you may need a more intense level of care, like an inpatient rehab. Here are some resources that you can consider. I want to see you succeed and help you with the resources that you need to get there.   [facility] [phone] [place], [place], [place] [phone] [place] [address] - [place], [place]  GC Stop  Syringe exchange, assistance with treatment [phone]
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
## 2018-02-07 NOTE — Assessment & Plan Note (Signed)
Not doing well. He is struggling to keep his appointments with [place], which means he is coming on and off suboxone constantly. We talked about options for a higher level of care today, including inpatient rehab like [facility] in [place]. Finances are a barrier for this. We decided that if he is unable to keep up with our treatment program over the next few visits that inpatient rehab would be the best thing for him. We will continue with Suboxone 16mg  daily, I prescribed one week supply. Follow up next Tuesday, urine substance test at that visit to confirm compliance.

## 2018-02-14 ENCOUNTER — Other Ambulatory Visit: Payer: Self-pay

## 2018-02-14 ENCOUNTER — Ambulatory Visit (INDEPENDENT_AMBULATORY_CARE_PROVIDER_SITE_OTHER): Payer: Self-pay | Admitting: Internal Medicine

## 2018-02-14 DIAGNOSIS — F329 Major depressive disorder, single episode, unspecified: Secondary | ICD-10-CM

## 2018-02-14 DIAGNOSIS — F320 Major depressive disorder, single episode, mild: Secondary | ICD-10-CM

## 2018-02-14 DIAGNOSIS — G4700 Insomnia, unspecified: Secondary | ICD-10-CM

## 2018-02-14 DIAGNOSIS — F1121 Opioid dependence, in remission: Secondary | ICD-10-CM

## 2018-02-14 DIAGNOSIS — F32A Depression, unspecified: Secondary | ICD-10-CM | POA: Insufficient documentation

## 2018-02-14 DIAGNOSIS — F419 Anxiety disorder, unspecified: Secondary | ICD-10-CM

## 2018-02-14 DIAGNOSIS — F1120 Opioid dependence, uncomplicated: Secondary | ICD-10-CM

## 2018-02-14 MED ORDER — FLUOXETINE HCL 20 MG PO TABS: 20.0000 mg | ORAL_TABLET | Freq: Every day | ORAL | 1 refills | Status: AC

## 2018-02-14 MED ORDER — BUPRENORPHINE HCL-NALOXONE HCL 8-2 MG SL SUBL: 1.0000 | SUBLINGUAL_TABLET | Freq: Two times a day (BID) | SUBLINGUAL | 0 refills | Status: DC

## 2018-02-14 MED ORDER — HYDROXYZINE HCL 25 MG PO TABS: 25.0000 mg | ORAL_TABLET | Freq: Every evening | ORAL | 2 refills | Status: AC | PRN

## 2018-02-14 MED FILL — hydrOXYzine HCL 25 MG TABS: 25 | 30 days supply | Qty: 30 | Fill #0

## 2018-02-14 MED FILL — FLUoxetine HCL 20 MG CAPS: 20 | 30 days supply | Qty: 30 | Fill #0

## 2018-02-14 MED FILL — BUPRENORPHIN-NALOXON 8-2 MG: 8-2 | 7 days supply | Qty: 14 | Fill #0

## 2018-02-14 NOTE — Patient Instructions (Signed)
[name] - -  I am glad you are doing so well!  Please come back to see [place] in 1 week.   For your anxiety and mood, please try Prozac (fluoxetine) 20mg  daily.  This medication may take up to 6 weeks to show full effect, so give it some time to work.  More information below.    Fluoxetine capsules or tablets (Depression/Mood Disorders) What is this medicine? FLUOXETINE (floo OX e teen) belongs to a class of drugs known as selective serotonin reuptake inhibitors (SSRIs). It helps to treat mood problems such as depression, obsessive compulsive disorder, and panic attacks. It can also treat certain eating disorders. This medicine may be used for other purposes; ask your health care provider or pharmacist if you have questions. COMMON BRAND NAME(S): Prozac What should I tell my health care provider before I take this medicine? They need to know if you have any of these conditions: -bipolar disorder or a family history of bipolar disorder -bleeding disorders -glaucoma -heart disease -liver disease -low levels of sodium in the blood -seizures -suicidal thoughts, plans, or attempt; a previous suicide attempt by you or a family member -take MAOIs like Carbex, Eldepryl, Marplan, Nardil, and Parnate -take medicines that treat or prevent blood clots -thyroid disease -an unusual or allergic reaction to fluoxetine, other medicines, foods, dyes, or preservatives -pregnant or trying to get pregnant -breast-feeding How should I use this medicine? Take this medicine by mouth with a glass of water. Follow the directions on the prescription label. You can take this medicine with or without food. Take your medicine at regular intervals. Do not take it more often than directed. Do not stop taking this medicine suddenly except upon the advice of your doctor. Stopping this medicine too quickly may cause serious side effects or your condition may worsen. A special MedGuide will be given to you by the pharmacist  with each prescription and refill. Be sure to read this information carefully each time. Talk to your pediatrician regarding the use of this medicine in children. While this drug may be prescribed for children as young as 7 years for selected conditions, precautions do apply. Overdosage: If you think you have taken too much of this medicine contact a poison control center or emergency room at once. NOTE: This medicine is only for you. Do not share this medicine with others. What if I miss a dose? If you miss a dose, skip the missed dose and go back to your regular dosing schedule. Do not take double or extra doses. What may interact with this medicine? Do not take this medicine with any of the following medications: -other medicines containing fluoxetine, like Sarafem or Symbyax -cisapride -linezolid -MAOIs like Carbex, Eldepryl, Marplan, Nardil, and Parnate -methylene blue (injected into a vein) -pimozide -thioridazine This medicine may also interact with the following medications: -alcohol -amphetamines -aspirin and aspirin-like medicines -carbamazepine -certain medicines for depression, anxiety, or psychotic disturbances -certain medicines for migraine headaches like almotriptan, eletriptan, frovatriptan, naratriptan, rizatriptan, sumatriptan, zolmitriptan -digoxin -diuretics -fentanyl -flecainide -furazolidone -isoniazid -lithium -medicines for sleep -medicines that treat or prevent blood clots like warfarin, enoxaparin, and dalteparin -NSAIDs, medicines for pain and inflammation, like ibuprofen or naproxen -phenytoin -procarbazine -propafenone -rasagiline -ritonavir -supplements like St. John's wort, kava kava, valerian -tramadol -tryptophan -vinblastine This list may not describe all possible interactions. Give your health care provider a list of all the medicines, herbs, non-prescription drugs, or dietary supplements you use. Also tell them if you smoke, drink alcohol,  or use illegal drugs. Some items may interact with your medicine. What should I watch for while using this medicine? Tell your doctor if your symptoms do not get better or if they get worse. Visit your doctor or health care professional for regular checks on your progress. Because it may take several weeks to see the full effects of this medicine, it is important to continue your treatment as prescribed by your doctor. Patients and their families should watch out for new or worsening thoughts of suicide or depression. Also watch out for sudden changes in feelings such as feeling anxious, agitated, panicky, irritable, hostile, aggressive, impulsive, severely restless, overly excited and hyperactive, or not being able to sleep. If this happens, especially at the beginning of treatment or after a change in dose, call your health care professional. Gary Hicks may get drowsy or dizzy. Do not drive, use machinery, or do anything that needs mental alertness until you know how this medicine affects you. Do not stand or sit up quickly, especially if you are an older patient. This reduces the risk of dizzy or fainting spells. Alcohol may interfere with the effect of this medicine. Avoid alcoholic drinks. Your mouth may get dry. Chewing sugarless gum or sucking hard candy, and drinking plenty of water may help. Contact your doctor if the problem does not go away or is severe. This medicine may affect blood sugar levels. If you have diabetes, check with your doctor or health care professional before you change your diet or the dose of your diabetic medicine. What side effects may I notice from receiving this medicine? Side effects that you should report to your doctor or health care professional as soon as possible: -allergic reactions like skin rash, itching or hives, swelling of the face, lips, or tongue -anxious -black, tarry stools -breathing problems -changes in vision -confusion -elevated mood, decreased need for  sleep, racing thoughts, impulsive behavior -eye pain -fast, irregular heartbeat -feeling faint or lightheaded, falls -feeling agitated, angry, or irritable -hallucination, loss of contact with reality -loss of balance or coordination -loss of memory -painful or prolonged erections -restlessness, pacing, inability to keep still -seizures -stiff muscles -suicidal thoughts or other mood changes -trouble sleeping -unusual bleeding or bruising -unusually weak or tired -vomiting Side effects that usually do not require medical attention (report to your doctor or health care professional if they continue or are bothersome): -change in appetite or weight -change in sex drive or performance -diarrhea -dry mouth -headache -increased sweating -nausea -tremors This list may not describe all possible side effects. Call your doctor for medical advice about side effects. You may report side effects to FDA at 1-800-FDA-1088. Where should I keep my medicine? Keep out of the reach of children. Store at room temperature between 15 and 30 degrees C (59 and 86 degrees F). Throw away any unused medicine after the expiration date. NOTE: This sheet is a summary. It may not cover all possible information. If you have questions about this medicine, talk to your doctor, pharmacist, or health care provider.  2018 Elsevier/Gold Standard (2015-12-20 15:55:27)

## 2018-02-14 NOTE — Progress Notes (Signed)
   02/14/2018  Gary Hicks presents for follow up of opioid use disorder I have reviewed the prior induction visit, follow up visits, and telephone encounters relevant to opiate use disorder (OUD) treatment.   Current daily dose: 16mg  of buprenorphine daily  Date of Induction: 12/28/17, but not stabilized on a dose as of yet  Current follow up interval, in weeks: 1 week  The patient has been adherent with the buprenorphine for OUD contract.   Last UDS Result: no new to review  HPI: Gary Hicks is a person with OUD who presents for follow up.  Gary Hicks has been intermittently following with our clinic.  He has not completely stabilized on buprenorphine regimen and there has been concern that he would need a higher level of care to get off of heroin, such as inpatient rehab.  He has financial difficulties which would make this hard for him to do.  He was seen last week by Dr. Evette Doffing and given another chance on buprenorphine-naloxone 8-2mg  BID.  He reports to me that he is doing well on this dose.  He reports limited craving and no withdrawal.  He did use heroin once and he had a blunted effect from the medication.  He denies any reason for why he used.  He tells me that he has had bad anxiety for most of his life.  He was previously treated with Klonopin and had some good results.  He was started on hydroxyzine in this clinic for insomnia and anxiety with some improvement, but not to the effect he was expecting.  He had a PHQ-9 today which was 12, mild depression as well.  He has concerns about his insurance.  He was declined for medicaid in the hospital, and would like to appeal.  He would also like to look in to disability and seeing a PCP.  He is accompanied by his wife, who is supportive of him.   Exam:   Vitals:   02/14/18 1048  BP: 107/75  Pulse: 100  Temp: 97.9 F (36.6 C)  TempSrc: Oral  SpO2: 100%  Weight: 134 lb (60.8 kg)  Height: 5\' 8"  (1.638 m)    General: Well dressed, alert,  NAD Eyes: Anicteric sclerae Psych: Pleasant, somewhat nervous  Assessment/Plan:  See Problem Based Charting in the Encounters Tab     Sid Falcon, MD  02/14/2018  11:53 AM

## 2018-02-14 NOTE — Assessment & Plan Note (Addendum)
He is improving today.  He does want to avoid higher level of care options due to financial restraints.  He plans to meet with our financial coordinator, Cecilia.  He has concomitant anxiety and mild depression which is also driving his use.  He is working to be compliant with our guidelines for clinic and was successful this week with one "slip up" of using heroin.  I provided him with information about [facility], [facility] and [employer] of the [place].    For now will continue with BID dosing of his suboxone.  I also advised that he start on an SSRI to help with his anxiety and mood symptoms in the interim.  Hopefully he will also be able to get in to see a therapist or psychiatrist.   Plan UDS today Buprenorphine-naloxone 8mg -2mg  BID START fluoxetine 20mg  daily today Hydroxyzine prn for sleep and anxiety  Follow up in 1 week

## 2018-02-20 LAB — TOXASSURE SELECT,+ANTIDEPR,UR

## 2018-02-21 ENCOUNTER — Ambulatory Visit (INDEPENDENT_AMBULATORY_CARE_PROVIDER_SITE_OTHER): Payer: Self-pay | Admitting: Student in an Organized Health Care Education/Training Program

## 2018-02-21 ENCOUNTER — Other Ambulatory Visit: Payer: Self-pay

## 2018-02-21 DIAGNOSIS — F1121 Opioid dependence, in remission: Secondary | ICD-10-CM

## 2018-02-21 DIAGNOSIS — F1120 Opioid dependence, uncomplicated: Secondary | ICD-10-CM

## 2018-02-21 MED ORDER — BUPRENORPHINE HCL-NALOXONE HCL 8-2 MG SL SUBL: 1.0000 | SUBLINGUAL_TABLET | Freq: Two times a day (BID) | SUBLINGUAL | 0 refills | Status: DC

## 2018-02-21 MED FILL — BUPRENORPHIN-NALOXON 8-2 MG: 8-2 | 7 days supply | Qty: 14 | Fill #0

## 2018-02-21 NOTE — Assessment & Plan Note (Signed)
Somewhat improving but still not well controlled.  We talked about his last urine substance testing which was high risk for possible diversion.  Rather I think that he just took his medicine right before coming into clinic.  I think he still is getting some good risk reduction from our treatments, though may not achieved total remission at this level of care.  May continue to follow him closely.  Continue with Suboxone 16 mg daily in divided doses.  Follow-up in 1 week.  We set a very clear goal of good compliance with the medication and 1 week with no heroin use.  We will see how he does next week.  Most likely will have to refer him to a higher level of care in the near future, though I am not sure if financially he will have much available for him without an assistance program.

## 2018-02-21 NOTE — Progress Notes (Signed)
   02/21/2018  Gary Hicks presents for follow up of opioid use disorder I have reviewed the prior induction visit, follow up visits, and telephone encounters relevant to opiate use disorder (OUD) treatment.   Current daily dose: Suboxone 16 mg daily.  Date of Induction: 12/28/17  Current follow up interval, in weeks: 1  The patient has not been adherent with the buprenorphine for OUD contract.   Last UDS Result: Inappropriate, polysubstances, heroin, low bupe metabolite level.  HPI: 32 year old man here for follow-up of severe opioid use disorder.  Patient has had a lot of trouble with the stabilization phase of his latest induction.  He was coming on and off Suboxone frequently for a couple months.  Over the last few weeks we have been more open that he may not be responding well and may need a higher level of care.  He is made some improvement and started showing up at his visits more regularly and on time.  He reports better compliance with the Suboxone.  He is reduced the amount that he is using from heroin several times a [place] to just a few times a week.  He reports going to a couple meetings recently which was somewhat helpful, though sometimes he gets triggered at some of those meetings by hearing people talk about the use of heroin.  Exam:   Vitals:   02/21/18 1113  BP: 118/78  Pulse: (!) 109  Temp: 98.1 F (36.7 C)  TempSrc: Oral  SpO2: 100%  Weight: 143 lb 4.8 oz (65 kg)  Height: 5\' 8"  (1.727 m)    General: Chronically ill-appearing young man Neuro: Alert and oriented, conversational, a little drowsy appearing, full strength, normal gait Extremities: Warm and well-perfused, no skin or soft tissue infections, no joint effusions  Psych: Mildly disheveled, mildly anxious appearing  Assessment/Plan:  See Problem Based Charting in the Encounters Tab   Axel Filler, MD  02/21/2018  11:39 AM

## 2018-02-21 NOTE — Patient Instructions (Signed)
We set a goal today to take your medication every [place] as prescribed, keep going to meetings, and let us go the whole next week without any heroin use.  Please follow-up with me in 1 week.  I really do want to see you succeed.

## 2018-02-28 ENCOUNTER — Other Ambulatory Visit: Payer: Self-pay

## 2018-02-28 ENCOUNTER — Ambulatory Visit: Payer: Self-pay

## 2018-02-28 ENCOUNTER — Encounter: Payer: Self-pay | Admitting: General Practice

## 2018-02-28 ENCOUNTER — Ambulatory Visit (INDEPENDENT_AMBULATORY_CARE_PROVIDER_SITE_OTHER): Payer: Self-pay | Admitting: Student in an Organized Health Care Education/Training Program

## 2018-02-28 DIAGNOSIS — F1121 Opioid dependence, in remission: Secondary | ICD-10-CM

## 2018-02-28 DIAGNOSIS — R011 Cardiac murmur, unspecified: Secondary | ICD-10-CM

## 2018-02-28 DIAGNOSIS — F1120 Opioid dependence, uncomplicated: Secondary | ICD-10-CM

## 2018-02-28 MED ORDER — BUPRENORPHINE HCL-NALOXONE HCL 8-2 MG SL SUBL: 1.0000 | SUBLINGUAL_TABLET | Freq: Two times a day (BID) | SUBLINGUAL | 0 refills | Status: AC

## 2018-02-28 MED FILL — BUPRENORPHIN-NALOXON 8-2 MG: 8-2 | 7 days supply | Qty: 14 | Fill #0

## 2018-02-28 NOTE — Patient Instructions (Signed)
Keep up the good work. We will see you back in 1 week for refills

## 2018-02-28 NOTE — Addendum Note (Signed)
Addended by: Lalla Brothers T on: 02/28/2018 11:52 AM   Modules accepted: Orders

## 2018-02-28 NOTE — Assessment & Plan Note (Signed)
Stable, improving, but I do not think this in the controlled he reports good compliance with Suboxone, denies using any heroin over the last 1 week.  Will check a urine substance test today to confirm.  Plan to continue with Suboxone 16 mg daily.  Follow-up in 1 week.

## 2018-02-28 NOTE — Progress Notes (Signed)
   02/28/2018  Gary Hicks presents for follow up of opioid use disorder I have reviewed the prior induction visit, follow up visits, and telephone encounters relevant to opiate use disorder (OUD) treatment.   Current daily dose: Suboxone 16mg  daily  Date of Induction: 12/28/17  Current follow up interval, in weeks: 1 week  The patient has not been adherent with the buprenorphine for OUD contract.   HPI: Gary Hicks is a 32 -year-old man living with opioid use disorder here for follow-up of that condition.  We had a lot of trouble getting him controlled over the last few visits.  He reports that over the last 1 week he was able to accomplish our goals from last visit.  Reports good compliance with Suboxone twice daily.  Reports his cravings are well controlled.  Denies using any heroin recently.  Currently living by himself, some days his wife stays with him.  Has 1 daughter that lives with her mother, turned 22 years old this week.  Overall I think that he is using a lot less heroin, but has not quite yet been able to make that jump to fully controlled.  Exam:   Vitals:   02/28/18 1007  BP: 120/84  Pulse: 100  Temp: 98.3 F (36.8 C)  TempSrc: Oral  SpO2: 100%  Weight: 143 lb 4.8 oz (65 kg)  Height: 5\' 9"  (1.753 m)    General: Little tired appearing, but overall in no distress CV: A little tachycardic, 2 out of 6 early systolic murmur at the left lower sternal border Extremities: Warm and well-perfused, normal joints with no effusions  Assessment/Plan:  See Problem Based Charting in the Encounters Tab  Lalla Brothers, MD

## 2018-03-02 ENCOUNTER — Encounter: Payer: Self-pay | Admitting: Student in an Organized Health Care Education/Training Program

## 2018-03-06 LAB — TOXASSURE SELECT,+ANTIDEPR,UR

## 2018-06-11 ENCOUNTER — Emergency Department (HOSPITAL_COMMUNITY): Payer: Self-pay

## 2018-06-11 ENCOUNTER — Emergency Department (HOSPITAL_COMMUNITY)
Admission: EM | Admit: 2018-06-11 | Discharge: 2018-06-11 | Disposition: A | Discharge status: Home / Self Care | Payer: Self-pay | Attending: Emergency Medicine | Admitting: Emergency Medicine

## 2018-06-11 ENCOUNTER — Encounter (HOSPITAL_COMMUNITY): Payer: Self-pay | Admitting: Pharmacy Technician

## 2018-06-11 ENCOUNTER — Other Ambulatory Visit: Payer: Self-pay

## 2018-06-11 DIAGNOSIS — Z79899 Other long term (current) drug therapy: Secondary | ICD-10-CM | POA: Insufficient documentation

## 2018-06-11 DIAGNOSIS — F17210 Nicotine dependence, cigarettes, uncomplicated: Secondary | ICD-10-CM | POA: Insufficient documentation

## 2018-06-11 DIAGNOSIS — M25532 Pain in left wrist: Secondary | ICD-10-CM | POA: Insufficient documentation

## 2018-06-11 NOTE — Discharge Instructions (Signed)
Your x-ray today was reassuring with no significant abnormalities.  No effusion was seen.  As we discussed, given your history, I am concerned that it could be an infected wrist with a septic joint however, after our shared decision-making conversation, you did not want to pursue labs or aspiration at this time.  Given your profession of using your hands and wrists, it could be overuse syndrome for which we will place in a splint and have you use over-the-counter anti-inflammatory medications.  If symptoms worsen whatsoever or you start having signs and symptoms of systemic infection, please return immediately to the nearest emergency department for likely labs and aspiration to rule out a septic joint given your history of the same.

## 2018-06-11 NOTE — ED Provider Notes (Signed)
[facility] Provider Note   CSN: [id number] Arrival date & time: 06/11/18  1123     History   Chief Complaint Chief Complaint  Patient presents with  . Wrist Pain    HPI Gary Hicks is a 32 y.o. male.  The history is provided by the patient and medical records. No language interpreter was used.  Wrist Pain  This is a new problem. The current episode started more than 2 days ago. The problem occurs constantly. The problem has not changed since onset.Pertinent negatives include no chest pain, no abdominal pain, no headaches and no shortness of breath. Nothing aggravates the symptoms. Nothing relieves the symptoms. He has tried nothing for the symptoms. The treatment provided no relief.    Past Medical History:  Diagnosis Date  . Bacteremia due to Staphylococcus aureus 09/11/2017  . Calf abscess    In setting of IVDU and bactermia  . Endocarditis of tricuspid valve   . Hepatitis C antibody test positive 11/11/2017  . History of deep venous thrombosis (DVT) of distal vein of right lower extremity 11/08/2017   Right Gastrocnemius vein dx 09/19/17. Needs 90 days A/C  . Pneumonia of right lower lobe due to infectious organism ([place])   . Seizures ([place])   . Septic infrapatellar bursitis of left knee   . 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 Staphylococcal arthritis of right hip ([place])   . Staphylococcal arthritis of right shoulder [facility])     Patient Active Problem List   Diagnosis Date Noted  . Anxiety and depression 02/14/2018  . Genital herpes 01/10/2018  . Severe tricuspid regurgitation 11/11/2017  . Hepatitis C antibody test positive 11/11/2017  . Severe opioid use disorder, in early remission ([place]) 10/19/2017    Past Surgical History:  Procedure Laterality Date  . CHOLECYSTECTOMY    . HAND SURGERY    . I&D EXTREMITY Left 09/23/2017   Procedure: IRRIGATION AND DEBRIDEMENT KNEE;  Surgeon: Renette Butters, MD;  Location: [facility];   Service: Orthopedics;  Laterality: Left;  . INCISION AND DRAINAGE HIP Bilateral 10/04/2017   Procedure: IRRIGATION AND DEBRIDEMENT HIP;  Surgeon: Renette Butters, MD;  Location: [facility];  Service: Orthopedics;  Laterality: Bilateral;        Home Medications    Prior to Admission medications   Medication Sig Start Date End Date Taking? Authorizing Provider  buprenorphine-naloxone (SUBOXONE) 8-2 mg SUBL SL tablet Place 1 tablet under the tongue 2 (two) times daily. 02/28/18   Axel Filler, MD  FLUoxetine (PROZAC) 20 MG tablet Take 1 tablet (20 mg total) by mouth daily. 02/14/18   Sid Falcon, MD  hydrOXYzine (ATARAX/VISTARIL) 25 MG tablet Take 1 tablet (25 mg total) by mouth at bedtime as needed (insomnia). 02/14/18   Sid Falcon, MD    Family History No family history on file.  Social History Social History   Tobacco Use  . Smoking status: Current Every Albanese Smoker    Packs/Niebuhr: 0.50    Types: Cigarettes  . Smokeless tobacco: Current User  . Tobacco comment: .5-1 PPD; wants to quit  Substance Use Topics  . Alcohol use: No  . Drug use: Yes    Types: IV    Comment: heroin     Allergies   Prednisone and Amoxicillin   Review of Systems Review of Systems  Constitutional: Positive for chills. Negative for diaphoresis, fatigue and fever.  HENT: Negative for congestion.   Eyes: Negative  for visual disturbance.  Respiratory: Negative for cough, choking, chest tightness, shortness of breath and wheezing.   Cardiovascular: Negative for chest pain, palpitations and leg swelling.  Gastrointestinal: Negative for abdominal pain, constipation, diarrhea, nausea and vomiting.  Genitourinary: Negative for flank pain.  Musculoskeletal: Negative for back pain, joint swelling, myalgias, neck pain and neck stiffness.  Skin: Positive for rash (reported former track marks). Negative for wound.  Neurological: Negative for headaches.  Psychiatric/Behavioral: Negative for  agitation.  All other systems reviewed and are negative.    Physical Exam Updated Vital Signs BP 125/88   Pulse 77   Temp 97.8 F (36.6 C) (Oral)   Resp 16   Ht 5\' 9"  (1.753 m)   Wt 63.5 kg   SpO2 94%   BMI 20.67 kg/m   Physical Exam  Constitutional: He appears well-developed and well-nourished. No distress.  HENT:  Head: Normocephalic and atraumatic.  Mouth/Throat: No oropharyngeal exudate.  Eyes: Pupils are equal, round, and reactive to light. Conjunctivae and EOM are normal.  Neck: Normal range of motion. Neck supple.  Cardiovascular: Normal rate and regular rhythm.  Murmur heard.  Systolic murmur is present. Pulmonary/Chest: Effort normal and breath sounds normal. No respiratory distress. He has no wheezes. He exhibits no tenderness.  Abdominal: Soft. There is no tenderness.  Musculoskeletal: He exhibits tenderness. He exhibits no edema or deformity.       Left wrist: He exhibits tenderness. He exhibits normal range of motion, no swelling, no deformity and no laceration.       Arms: Normal sensation, strength, and pulse in arm.  Normal cap refill.  Neurological: He is alert. No sensory deficit. He exhibits normal muscle tone.  Skin: Skin is warm. Capillary refill takes less than 2 seconds. He is not diaphoretic. No erythema. No pallor.  Psychiatric: He has a normal mood and affect.  Nursing note and vitals reviewed.    ED Treatments / Results  Labs (all labs ordered are listed, but only abnormal results are displayed) Labs Reviewed - No data to display  EKG None  Radiology Dg Wrist Complete Left  Result Date: 06/11/2018 CLINICAL DATA:  Swelling of the wrist beginning 2 days ago. Questionable injury at work. Scaphoid region pain. EXAM: LEFT WRIST - COMPLETE 3+ VIEW COMPARISON:  None. FINDINGS: There is no evidence of fracture or dislocation. There is no evidence of arthropathy or other focal bone abnormality. Soft tissues are unremarkable. IMPRESSION: Negative.  Electronically Signed   By: Nelson Chimes M.D.   On: 06/11/2018 12:57    Procedures Procedures (including critical care time)  Medications Ordered in ED Medications - No data to display   Initial Impression / Assessment and Plan / ED Course  I have reviewed the triage vital signs and the nursing notes.  Pertinent labs & imaging results that were available during my care of the patient were reviewed by me and considered in my medical decision making (see chart for details).     Gary Hicks is a 32 y.o. 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  Patient reports that he has been clean for 3 months.  He assures me he has not had any recent IV drug use.  He reports that all of the track marks are scars from the past.  He says that over the last several days  he has had pain and swelling of his left wrist.  He is right-handed.  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  He had to leave work today due to discomfort.  He reports occasional chills but denies any fevers at home.  He denies nausea, vomiting, chest pain, shortness of breath, cough.  He denies any severe fatigue.  He denies nausea vomiting, abdominal pain, constipation, diarrhea, or urinary symptoms.  He denies any injection into his arms recently.  On exam, patient has pain with wrist manipulation on the left.  No snuffbox tenderness.  Patient has what he reports as old track marks on the thumb arm, and elbow.  He has normal grip strength and sensation of the hands.  Normal capillary refill.  Palpable radial and ulnar arteries.  Lungs clear chest is nontender.  Slight systolic murmur appreciated.  A shared decision-making conization was held with the patient.  I expressed that due to his history of numerous septic arthritis episodes, with a lack of trauma and  onset of wrist pain I am concerned about this.  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  He would rather get an x-ray and consider doing rice and splinting.  Will obtain x-ray and then reevaluate.  1:37 PM Patient's x-ray was reassuring.  No evidence of fracture, dislocation or other arthropathy.  No large effusion was seen.  No abnormality seen.  On reexamination, patient is able to move his wrist.  Patient continues to have some tenderness at the wrist.    Patient was advised that given his history of multiple episodes of septic arthritis, I am still concerned about this despite his report of no recent IV drug use.  Patient refuses any lab work or aspiration of the joint at this time.  Patient advised that this could lead to sepsis, loss of hand/wrist, and death however he does not want to do this at this time.    Patient will be placed into a wrist splint to treat possible overuse syndrome given his profession as a Biomedical scientist and using his hands significantly.  Patient will use over-the-counter anti-inflammatories and follow-up with his primary doctor.  Patient understood extremely strict return precautions for any signs or symptoms of infection or worsened pain.  Next  Patient and other questions or concerns and was discharged in stable condition.   Final Clinical Impressions(s) / ED Diagnoses   Final diagnoses:  Left wrist pain    ED Discharge Orders    None      Clinical Impression: 1. Left wrist pain     Disposition: Discharge  Condition: Good  I have discussed the results, Dx and Tx plan with the pt(& family if present). He/she/they expressed understanding and agree(s) with the plan. Discharge instructions discussed at great length. Strict return precautions discussed and pt &/or family have verbalized understanding of the instructions. No further questions at time of discharge.     New Prescriptions   No medications on file    Follow Up: [facility] [address] [place] [address] [phone] Schedule an appointment as soon as possible for a visit    [facility] [address] [phone] [place]       Kandra Graven, Gwenyth Allegra, MD 06/11/18 1341

## 2018-06-11 NOTE — ED Notes (Signed)
Wrist applied to pt's left wrist, tolerated well. Pt informed of device, and understood.

## 2018-06-11 NOTE — ED Triage Notes (Signed)
Pt arrives POV with reports of wrist pain/swelling over last few days. Denies known injury.

## 2018-10-14 ENCOUNTER — Encounter (HOSPITAL_COMMUNITY): Payer: Self-pay | Admitting: Emergency Medicine

## 2018-10-14 ENCOUNTER — Emergency Department (HOSPITAL_COMMUNITY): Payer: Medicaid Other

## 2018-10-14 ENCOUNTER — Emergency Department (HOSPITAL_COMMUNITY)
Admission: EM | Admit: 2018-10-14 | Discharge: 2018-11-01 | Disposition: E | Payer: Medicaid Other | Attending: Emergency Medicine | Admitting: Emergency Medicine

## 2018-10-14 DIAGNOSIS — X749XXA Intentional self-harm by unspecified firearm discharge, initial encounter: Secondary | ICD-10-CM | POA: Insufficient documentation

## 2018-10-14 DIAGNOSIS — Y939 Activity, unspecified: Secondary | ICD-10-CM | POA: Diagnosis not present

## 2018-10-14 DIAGNOSIS — Y999 Unspecified external cause status: Secondary | ICD-10-CM | POA: Insufficient documentation

## 2018-10-14 DIAGNOSIS — S0193XA Puncture wound without foreign body of unspecified part of head, initial encounter: Secondary | ICD-10-CM

## 2018-10-14 DIAGNOSIS — Z01818 Encounter for other preprocedural examination: Secondary | ICD-10-CM

## 2018-10-14 DIAGNOSIS — Y929 Unspecified place or not applicable: Secondary | ICD-10-CM | POA: Diagnosis not present

## 2018-10-14 DIAGNOSIS — S0990XA Unspecified injury of head, initial encounter: Secondary | ICD-10-CM | POA: Insufficient documentation

## 2018-10-14 DIAGNOSIS — W3400XA Accidental discharge from unspecified firearms or gun, initial encounter: Secondary | ICD-10-CM

## 2018-10-14 LAB — CBC
HCT: 34.2 % — ABNORMAL LOW (ref 39.0–52.0)
Hemoglobin: 10.5 g/dL — ABNORMAL LOW (ref 13.0–17.0)
MCH: 28.1 pg (ref 26.0–34.0)
MCHC: 30.7 g/dL (ref 30.0–36.0)
MCV: 91.4 fL (ref 80.0–100.0)
Platelets: 160 10*3/uL (ref 150–400)
RBC: 3.74 MIL/uL — ABNORMAL LOW (ref 4.22–5.81)
RDW: 14.9 % (ref 11.5–15.5)
WBC: 9.7 10*3/uL (ref 4.0–10.5)
nRBC: 0 % (ref 0.0–0.2)

## 2018-10-14 LAB — RAPID HIV SCREEN (HIV 1/2 AB+AG)
HIV 1/2 Antibodies: NONREACTIVE
HIV-1 P24 Antigen - HIV24: NONREACTIVE

## 2018-10-14 LAB — LACTIC ACID, PLASMA: Lactic Acid, Venous: 2.2 mmol/L (ref 0.5–1.9)

## 2018-10-14 LAB — COMPREHENSIVE METABOLIC PANEL
ALBUMIN: 3 g/dL — AB (ref 3.5–5.0)
ALT: 13 U/L (ref 0–44)
AST: 31 U/L (ref 15–41)
Alkaline Phosphatase: 122 U/L (ref 38–126)
Anion gap: 8 (ref 5–15)
BILIRUBIN TOTAL: 0.9 mg/dL (ref 0.3–1.2)
BUN: 9 mg/dL (ref 6–20)
CO2: 26 mmol/L (ref 22–32)
Calcium: 8.9 mg/dL (ref 8.9–10.3)
Chloride: 100 mmol/L (ref 98–111)
Creatinine, Ser: 0.96 mg/dL (ref 0.61–1.24)
GFR calc Af Amer: >60 mL/min (ref >60)
GFR calc non Af Amer: >60 mL/min (ref >60)
Glucose, Bld: 184 mg/dL — ABNORMAL HIGH (ref 70–99)
Potassium: 3.7 mmol/L (ref 3.5–5.1)
Sodium: 134 mmol/L — ABNORMAL LOW (ref 135–145)
Total Protein: 6.6 g/dL (ref 6.5–8.1)

## 2018-10-14 LAB — ABO/RH: ABO/RH(D): O POS

## 2018-10-14 LAB — PROTIME-INR
INR: 1.7 — ABNORMAL HIGH (ref 0.8–1.2)
Prothrombin Time: 19.3 seconds — ABNORMAL HIGH (ref 11.4–15.2)

## 2018-10-14 LAB — CDS SEROLOGY

## 2018-10-14 LAB — ETHANOL

## 2018-10-14 MED ORDER — SUCCINYLCHOLINE CHLORIDE 20 MG/ML IJ SOLN
INTRAMUSCULAR | Status: AC | PRN
  Administered 2018-10-14: 120 mg via INTRAVENOUS

## 2018-10-14 MED ORDER — SODIUM CHLORIDE 0.9 % IV SOLN
INTRAVENOUS | Status: AC | PRN
  Administered 2018-10-14: 1000 mL via INTRAVENOUS

## 2018-10-14 MED ORDER — ETOMIDATE 2 MG/ML IV SOLN
INTRAVENOUS | Status: AC | PRN
  Administered 2018-10-14: 15 mg via INTRAVENOUS

## 2018-10-14 MED ORDER — MORPHINE SULFATE (PF) 4 MG/ML IV SOLN: 4.0000 mg | INTRAVENOUS | Status: DC | PRN

## 2018-10-15 LAB — PREPARE FRESH FROZEN PLASMA
Unit division: 0
Unit division: 0
Unit division: 0
Unit division: 0
Unit division: 0
Unit division: 0
Unit division: 0
Unit division: 0
Unit division: 0
Unit division: 0

## 2018-10-15 LAB — TYPE AND SCREEN
ABO/RH(D): O POS
Antibody Screen: NEGATIVE
UNIT DIVISION: 0
Unit division: 0
Unit division: 0
Unit division: 0
Unit division: 0
Unit division: 0
Unit division: 0
Unit division: 0
Unit division: 0
Unit division: 0

## 2018-10-15 LAB — BPAM FFP
BLOOD PRODUCT EXPIRATION DATE: 202003182359
BLOOD PRODUCT EXPIRATION DATE: 202003182359
BLOOD PRODUCT EXPIRATION DATE: 202003302359
Blood Product Expiration Date: 202003182359
Blood Product Expiration Date: 202003182359
Blood Product Expiration Date: 202003182359
Blood Product Expiration Date: 202003182359
Blood Product Expiration Date: 202003302359
Blood Product Expiration Date: 202003302359
Blood Product Expiration Date: 202004032359
ISSUE DATE / TIME: 202003141129
ISSUE DATE / TIME: 202003141203
ISSUE DATE / TIME: 202003141203
ISSUE DATE / TIME: 202003141212
ISSUE DATE / TIME: 202003141212
ISSUE DATE / TIME: 202003141227
ISSUE DATE / TIME: 202003141238
ISSUE DATE / TIME: 202003141238
ISSUE DATE / TIME: 202003141129
ISSUE DATE / TIME: 202003141227
UNIT TYPE AND RH: 6200
Unit Type and Rh: 5100
Unit Type and Rh: 5100
Unit Type and Rh: 600
Unit Type and Rh: 6200
Unit Type and Rh: 6200
Unit Type and Rh: 6200
Unit Type and Rh: 6200
Unit Type and Rh: 6200
Unit Type and Rh: 6200

## 2018-10-15 LAB — BPAM RBC
BLOOD PRODUCT EXPIRATION DATE: 202004172359
BLOOD PRODUCT EXPIRATION DATE: 202004172359
Blood Product Expiration Date: 202004092359
Blood Product Expiration Date: 202004162359
Blood Product Expiration Date: 202004162359
Blood Product Expiration Date: 202004172359
Blood Product Expiration Date: 202004172359
Blood Product Expiration Date: 202004172359
Blood Product Expiration Date: 202004172359
Blood Product Expiration Date: 202004172359
ISSUE DATE / TIME: 202003141129
ISSUE DATE / TIME: 202003141129
ISSUE DATE / TIME: 202003141203
ISSUE DATE / TIME: 202003141203
ISSUE DATE / TIME: 202003141212
ISSUE DATE / TIME: 202003141212
ISSUE DATE / TIME: 202003141226
ISSUE DATE / TIME: 202003141226
ISSUE DATE / TIME: 202003141732
ISSUE DATE / TIME: 202003142358
UNIT TYPE AND RH: 5100
UNIT TYPE AND RH: 5100
UNIT TYPE AND RH: 5100
Unit Type and Rh: 5100
Unit Type and Rh: 5100
Unit Type and Rh: 5100
Unit Type and Rh: 5100
Unit Type and Rh: 5100
Unit Type and Rh: 5100
Unit Type and Rh: 5100

## 2018-10-15 LAB — BPAM PLATELET PHERESIS
Blood Product Expiration Date: 202003152359
ISSUE DATE / TIME: 202003141215
Unit Type and Rh: 7300

## 2018-10-15 LAB — PREPARE CRYOPRECIPITATE: Unit division: 0

## 2018-10-15 LAB — PREPARE PLATELET PHERESIS: Unit division: 0

## 2018-10-15 LAB — BPAM CRYOPRECIPITATE
Blood Product Expiration Date: 202003141830
ISSUE DATE / TIME: 202003141256
Unit Type and Rh: 5100

## 2018-10-16 ENCOUNTER — Encounter (HOSPITAL_COMMUNITY): Payer: Self-pay | Admitting: Pharmacy Technician

## 2018-10-16 LAB — HEPATITIS C ANTIBODY (REFLEX)

## 2018-10-16 LAB — HEPATITIS B SURFACE ANTIGEN: Hepatitis B Surface Ag: NEGATIVE
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
## 2018-11-01 NOTE — Progress Notes (Signed)
I spoke with pt's mother and updated her on his condition. She states she would like to keep him comfortable and extubate him and stop all pressors. Mom is at the bedside and dad is aware.
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
## 2018-11-01 NOTE — Progress Notes (Signed)
PT with no palp pulse and no rhythm  Time of death 1420

## 2018-11-01 NOTE — Procedures (Signed)
Central Venous Catheter Insertion Procedure Note Sahmir Weatherbee Golembeski II [id number] 10-28-1985  Procedure: Insertion of Central Venous Catheter Indications: Emergent intravascular access  Procedure Details Consent: Unable to obtain consent because of emergent medical necessity. Time Out: Verified patient identification, verified procedure, site/side was marked, verified correct patient position, special equipment/implants available, medications/allergies/relevent history reviewed, required imaging and test results available.  Performed  Maximum sterile technique was used including antiseptics, gloves, gown and mask. Skin prep: Chlorhexidine; local anesthetic administered A antimicrobial bonded/coated triple lumen catheter was placed in the right femoral vein due to emergent situation using the Seldinger technique.  Evaluation Blood flow good Complications: No apparent complications Patient did tolerate procedure well. Chest X-ray ordered to verify placement.  CXR: None needed.  Ralene Ok 2018-11-03, 3:50 PM

## 2018-11-01 NOTE — Progress Notes (Signed)
   November 13, 2018 1600  Clinical Encounter Type  Visited With Patient and family together  Visit Type Death  Family did arrived. Met with mother, father and other family members of patient for death notification from doctor. Provided spiritual and grief support.
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
## 2018-11-01 NOTE — ED Notes (Signed)
Per family request - they would like to withdrawal all care - per dr. Rosendo Gros -- resp therapy to pull ETT,

## 2018-11-01 NOTE — ED Notes (Signed)
Skin Color change noted from nipple line down while in CT

## 2018-11-01 DEATH — deceased

## 2018-12-11 IMAGING — DX DG HIP (WITH OR WITHOUT PELVIS) 2V BILAT
3 series · 3 of 3 positions shown · non-contrast
Comparison: None.

CLINICAL DATA: Hip pain.

EXAM:
DG HIP (WITH OR WITHOUT PELVIS) 2V BILAT

[pelvis ap]
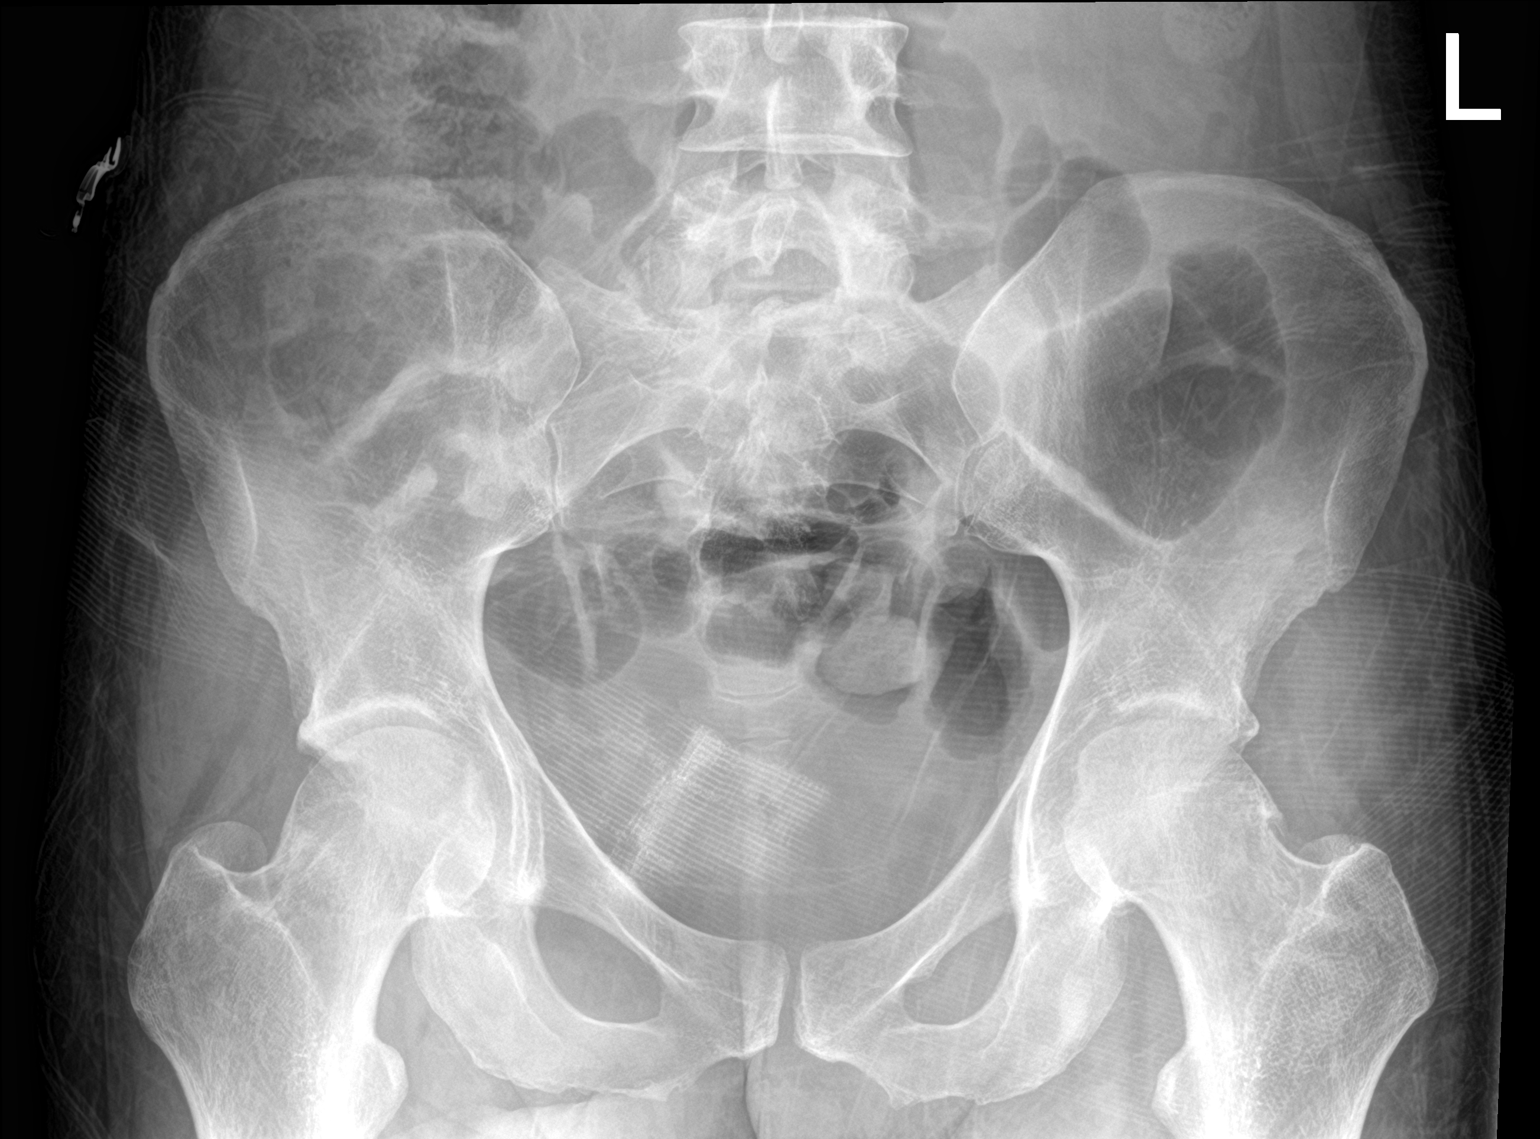

[hip lat (1 of 2)]
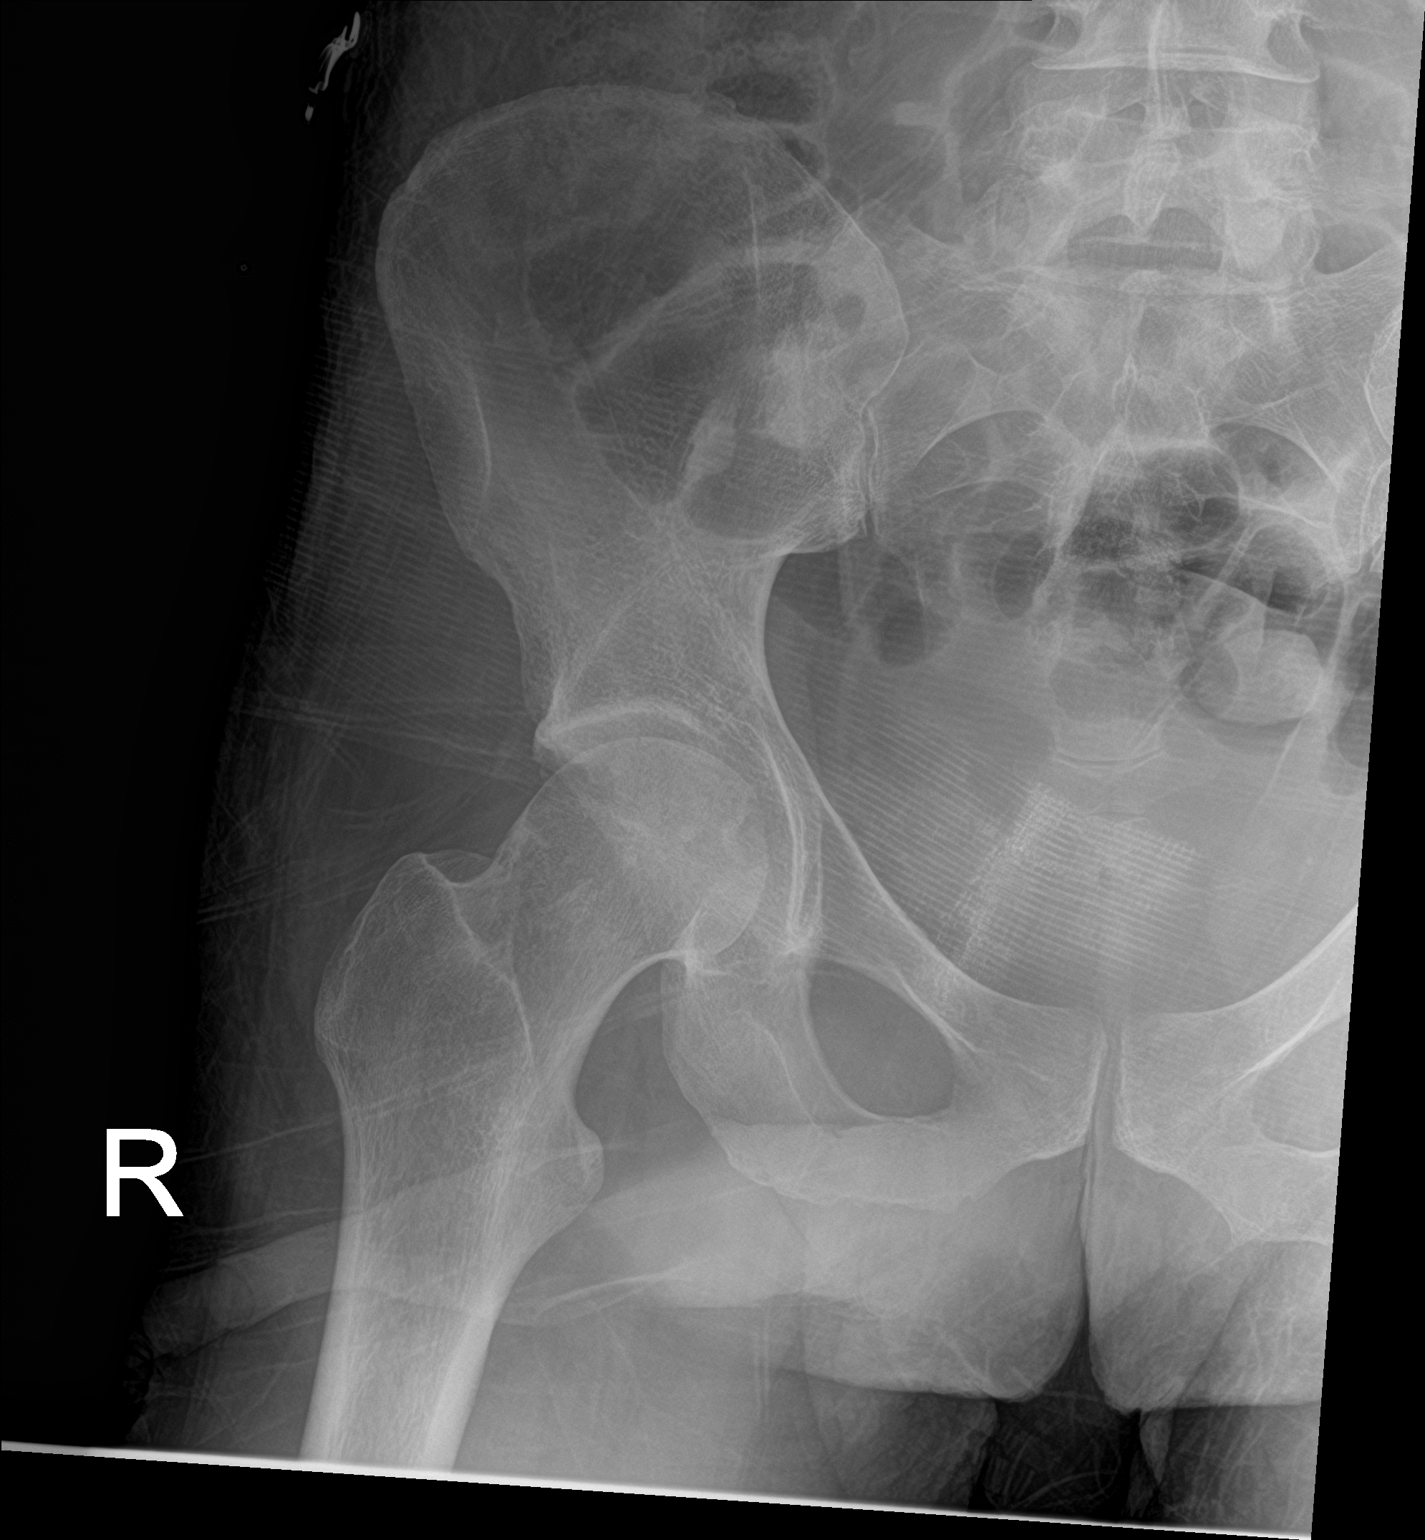

[hip lat (2 of 2)]
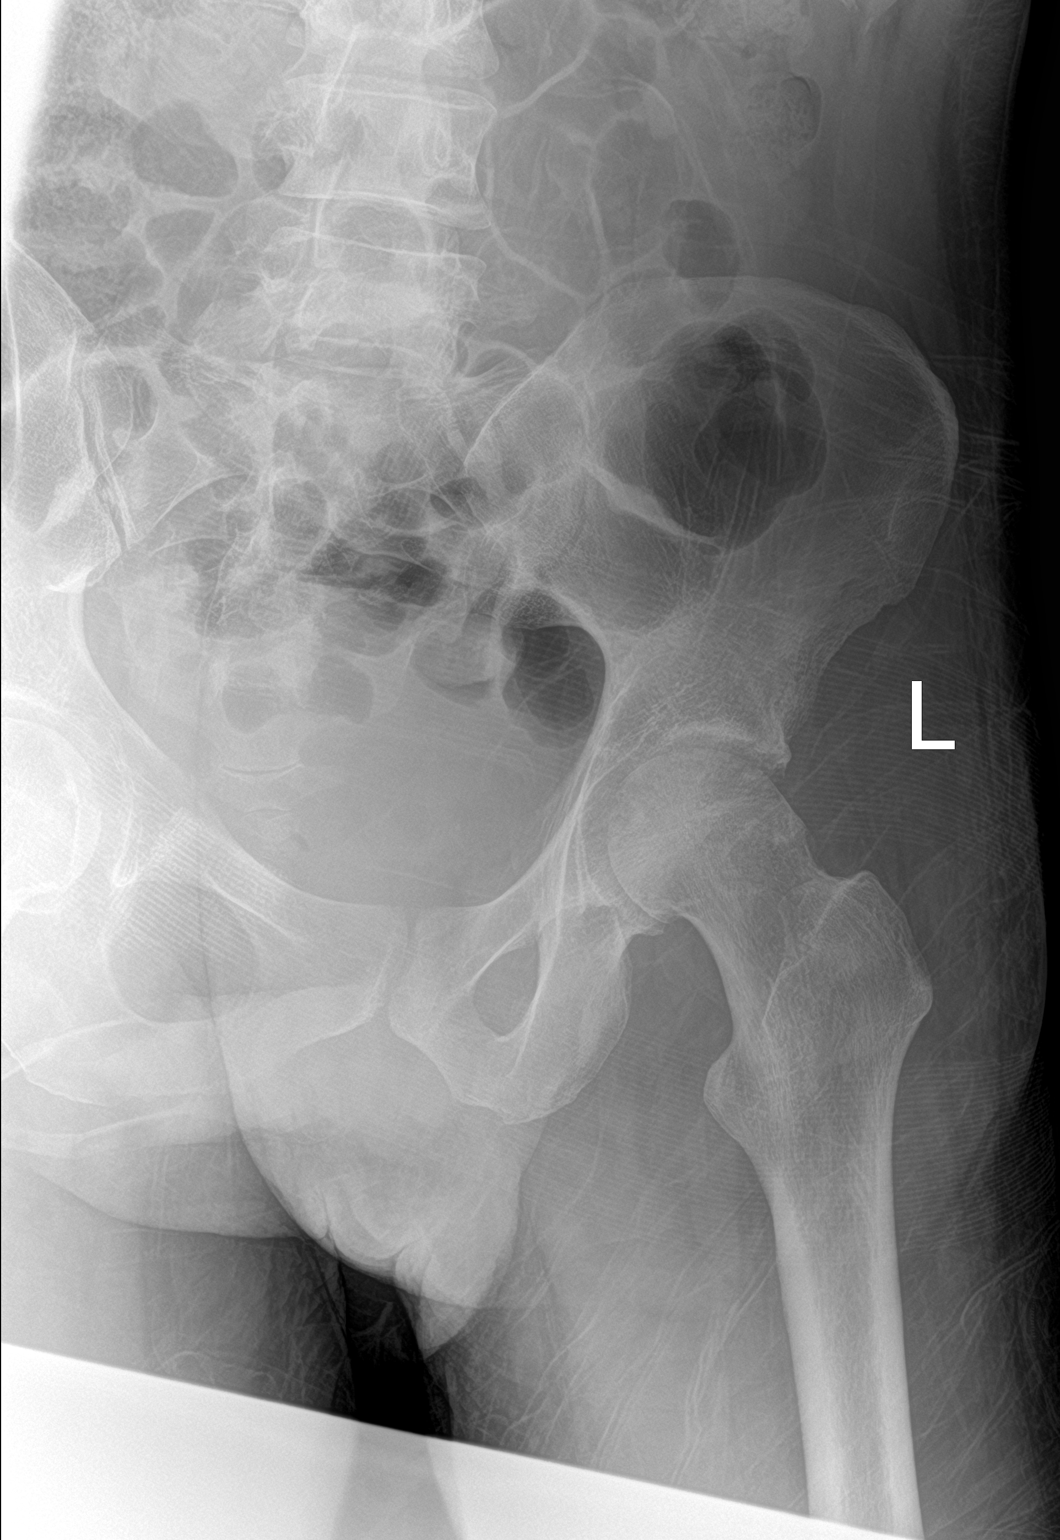

[3 of 3 positions shown; findings below may reference images not displayed]

FINDINGS: There are degenerative changes in the left hip with an osteophyte
off the femoral neck. No significant loss of joint space. No
fractures or dislocations identified. No other acute abnormalities.
IMPRESSION: Mild degenerative changes in the left hip.  No other abnormalities.

## 2018-12-12 IMAGING — MR MR HIP*R* WO/W CM
5 of 10 series · 18 of 40 positions shown · IV contrast (multihance)
Comparison: None.

CLINICAL DATA: Bilateral hip and back pain.  Left worse than right.

EXAM:
MRI OF THE LEFT HIP WITHOUT AND WITH CONTRAST
TECHNIQUE: Multiplanar, multisequence MR imaging was performed both before and
after administration of intravenous contrast.
CONTRAST:  15mL MULTIHANCE GADOBENATE DIMEGLUMINE 529 MG/ML IV SOLN

[Series 4: T1 · coronal · 3.5mm · 0.62mm/px · 4 of 43 slices shown (1 of 2)]
[im 1/43]
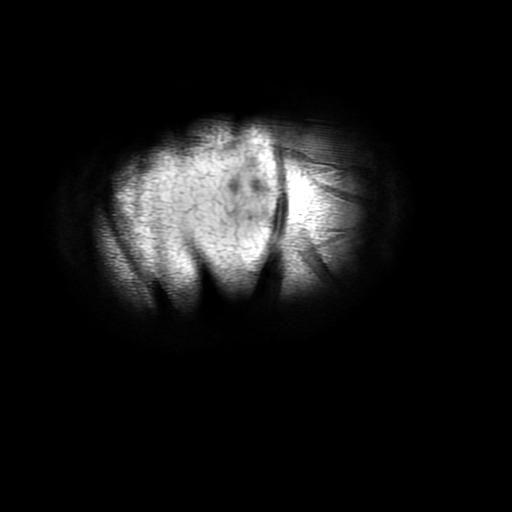
[im 15/43]
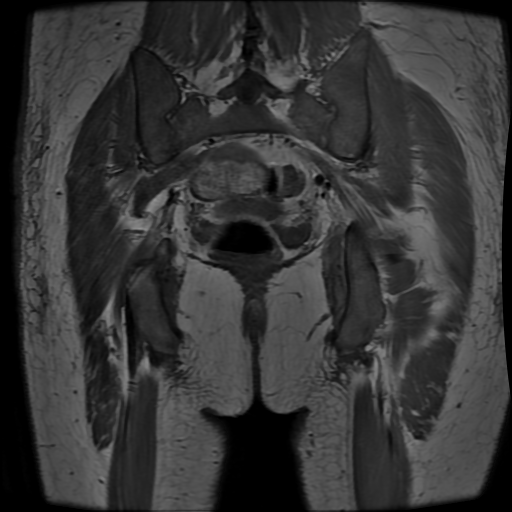
[im 29/43]
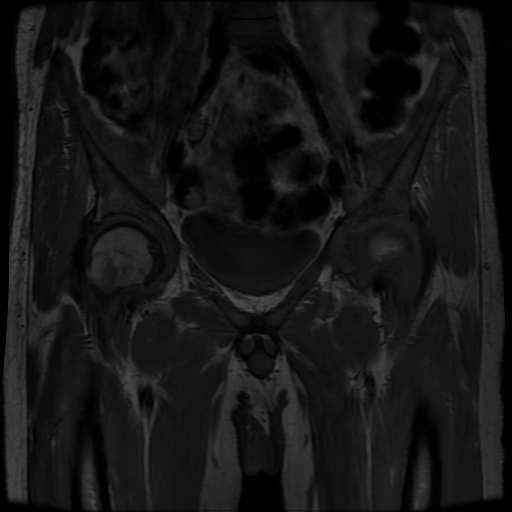
[im 43/43]
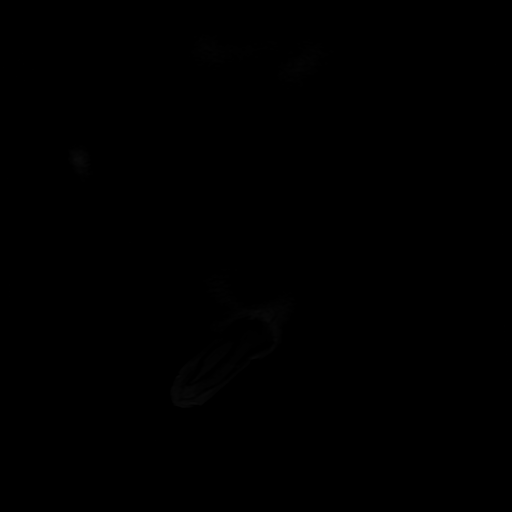

[Series 6: T2 fat-sat · axial · 3.5mm · 0.62mm/px · z∈[-87,+149]mm · 5 of 60 slices shown]
[im 1/60]
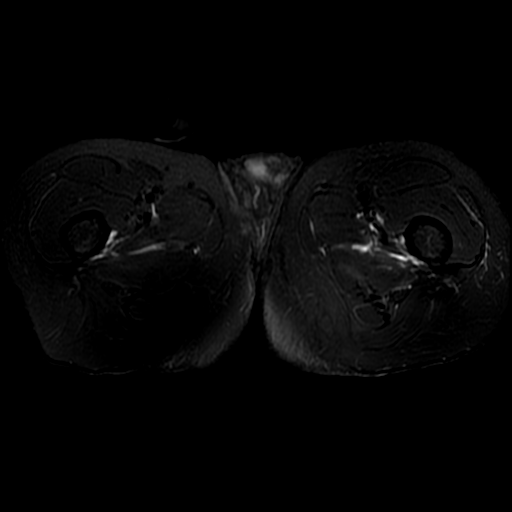
[im 15/60]
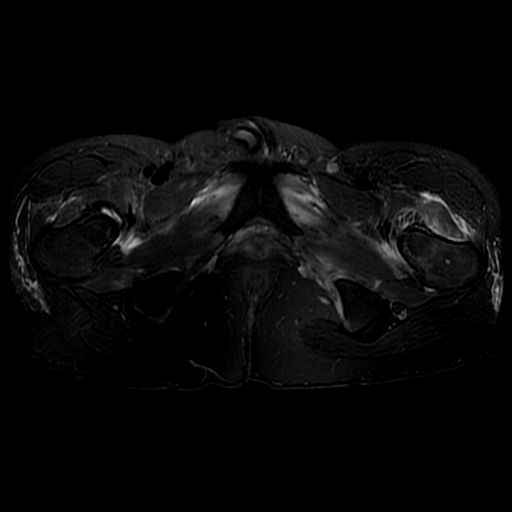
[im 30/60]
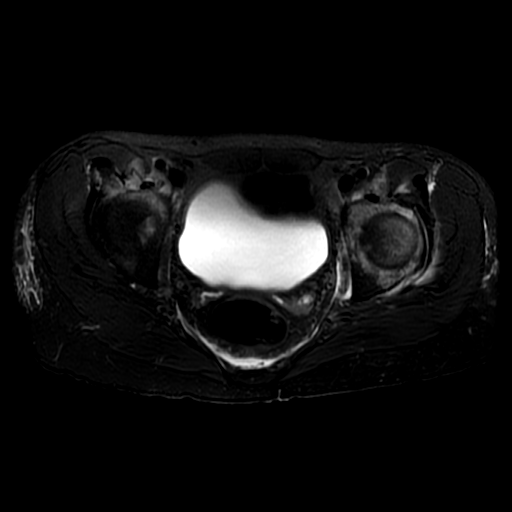
[im 45/60]
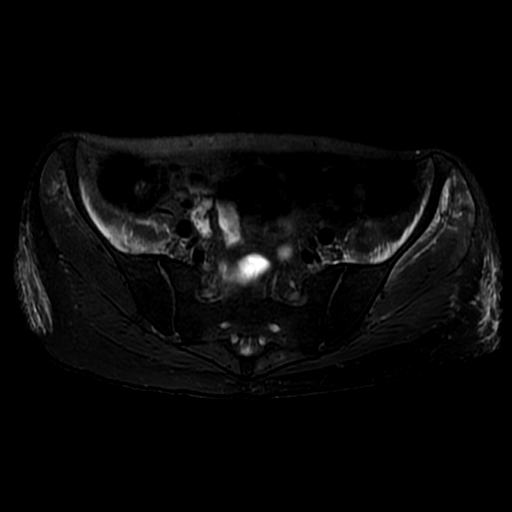
[im 60/60]
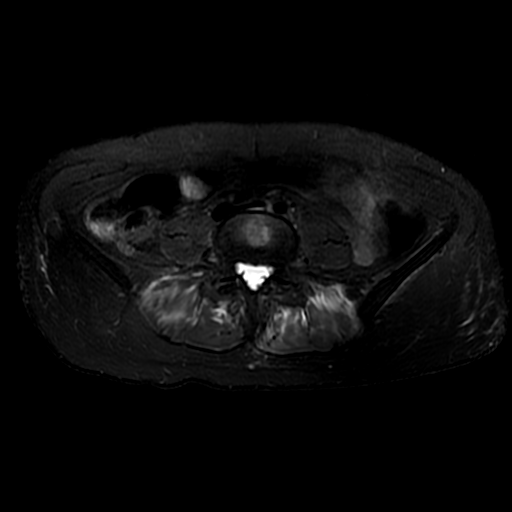

[Series 7: T1 · axial · 3.5mm · 0.62mm/px · z∈[-87,+149]mm · 5 of 60 slices shown (2 of 2)]
[im 1/60]
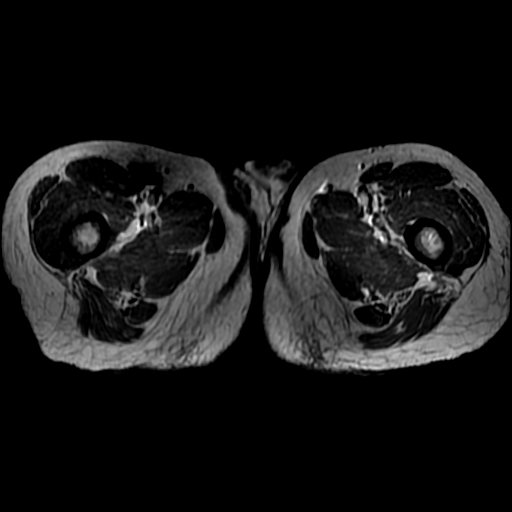
[im 15/60]
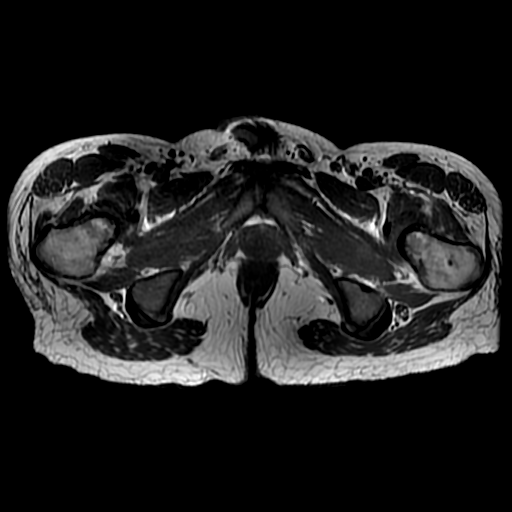
[im 30/60]
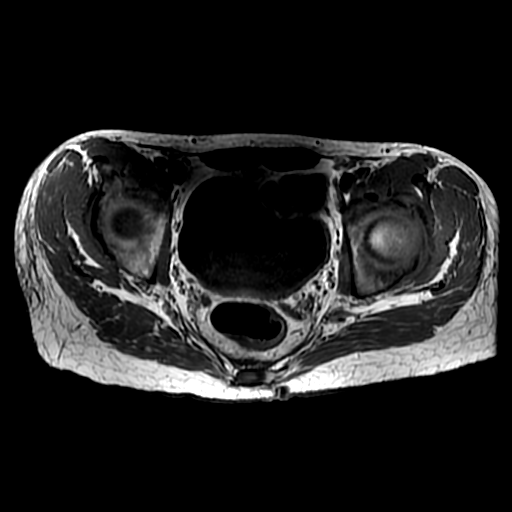
[im 45/60]
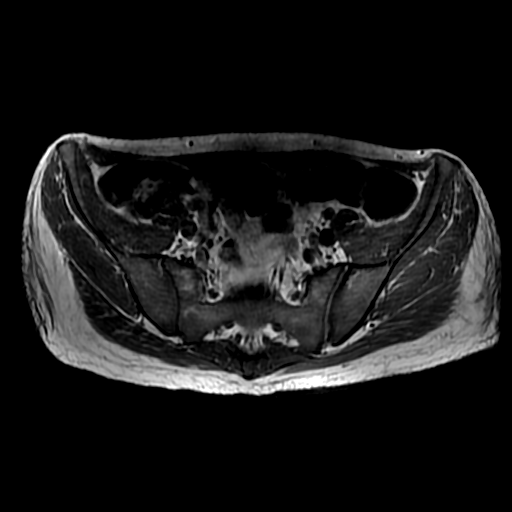
[im 60/60]
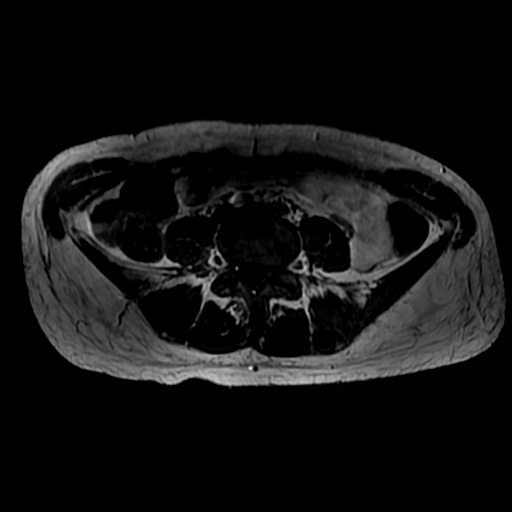

[Series 9: PD fat-sat · sagittal · 4.0mm · 0.35mm/px · 2 of 30 slices shown (1 of 2)]
[im 1/30]
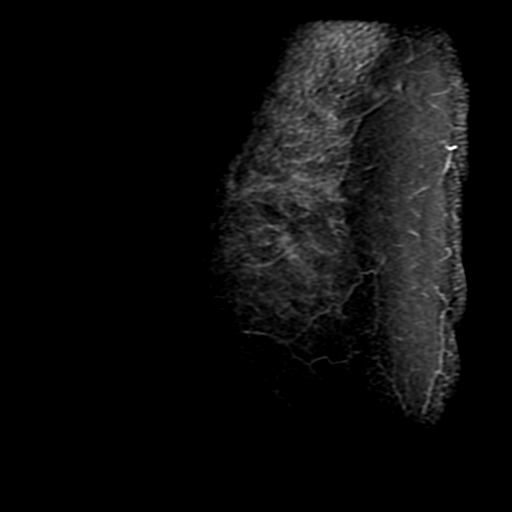
[im 30/30]
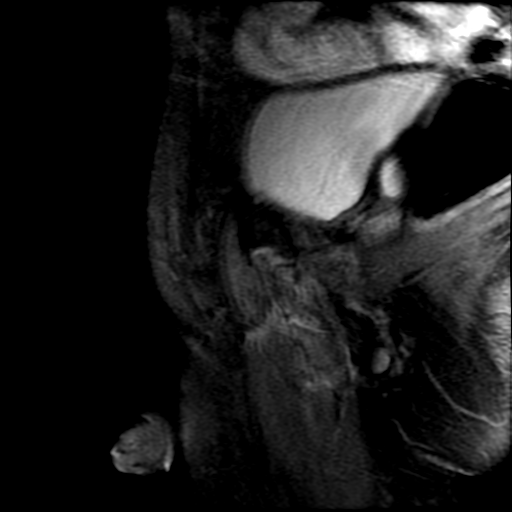

[Series 10: PD fat-sat · sagittal · 4.0mm · 0.35mm/px · 2 of 30 slices shown (2 of 2)]
[im 1/30]
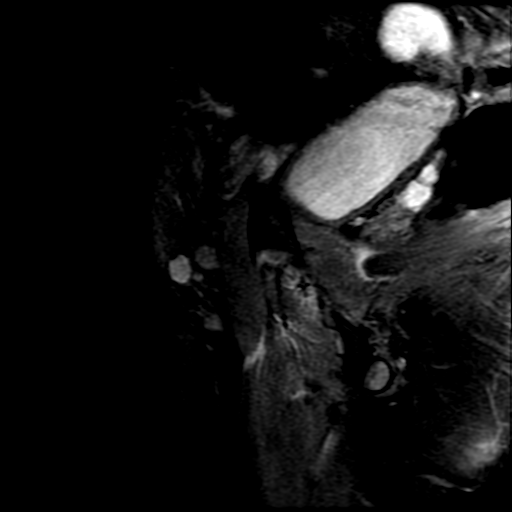
[im 30/30]
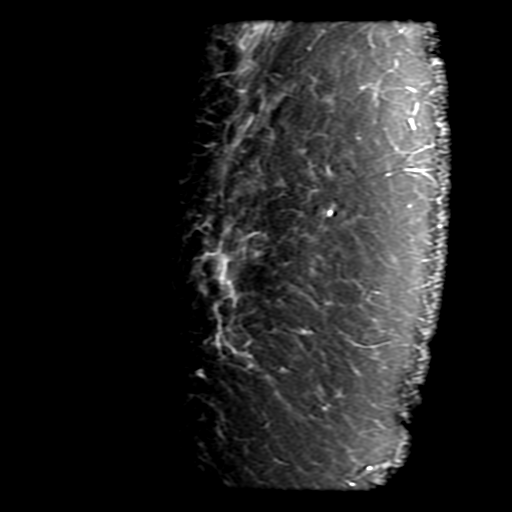

[18 of 40 positions shown; findings below may reference images not displayed]

FINDINGS: Bones: No hip fracture, dislocation or avascular necrosis. Marrow
edema in the left femoral head and acetabulum. Mild marrow edema in
the anterior right acetabulum. Bilateral pericapsular edema and
enhancement. No periosteal reaction or bone destruction. No
aggressive osseous lesion.

Normal sacrum and sacroiliac joints. No SI joint widening or erosive
changes.

Articular cartilage and labrum

Articular cartilage: Partial-thickness bilateral femoral heads and
acetabulum, left greater than right. Small femoral inferior marginal
osteophytes.

Labrum: Grossly intact, but evaluation is limited by lack of
intraarticular fluid.

Joint or bursal effusion

Joint effusion: Large left hip joint effusion with severe synovitis
and enhancement. Small right hip joint effusion with synovitis and
enhancement. No SI joint effusion.

Bursae:  No bursa formation.

Muscles and tendons

Muscle edema in the left obturator internus muscle with a 13 mm
fluid collection posteriorly within the muscle concerning for a
small abscess. Muscle edema and mild enhancement in the adductor
musculature bilaterally and right iliacus muscle. Muscle edema and
enhancement of bilateral multifidus muscles.

Other findings

Miscellaneous: No pelvic free fluid. No fluid collection or
hematoma. No inguinal lymphadenopathy. No inguinal hernia.
IMPRESSION: 1. Large left hip joint effusion with enhancing synovitis,
pericapsular edema and severe marrow edema on either side of the
joint most concerning for septic arthritis. There is underlying
mild-moderate osteoarthritis of the left hip.
2. Small right hip joint effusion with enhancing synovitis and
pericapsular edema and enhancement most concerning for septic
arthritis.
3. Muscle edema within the adductor musculature bilaterally and
multifidus muscles bilaterally most concerning for infectious
myositis. 13 mm fluid collection in the posterior aspect of the left
obturator internus muscle most concerning for a small abscess.

## 2019-11-27 IMAGING — US US EXTREM LOW*R* LIMITED
1 series · 12 of 12 positions shown · non-contrast
Comparison: Four days prior

CLINICAL DATA: Leg pain

EXAM:
ULTRASOUND RIGHT LOWER EXTREMITY LIMITED
TECHNIQUE: Ultrasound examination of the lower extremity soft tissues was
performed in the area of clinical concern.

[Series 1: us extrem low*right* limited · 0.06mm/px · 12 of 12 slices shown]
[im 1/12]
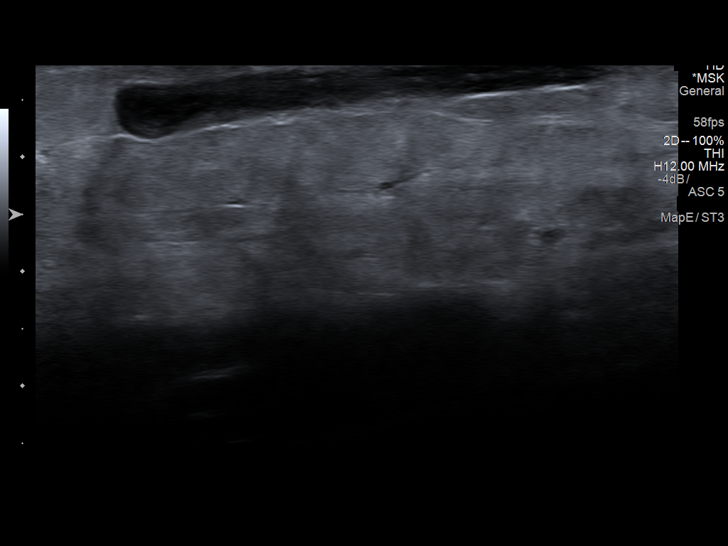
[im 2/12]
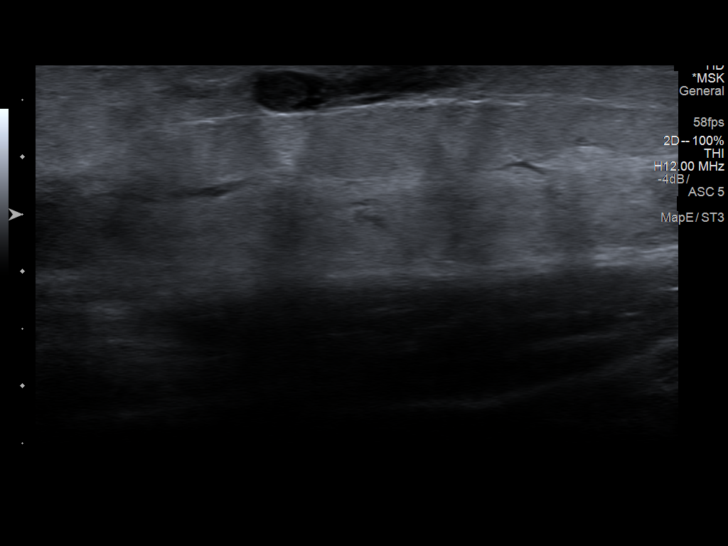
[im 3/12]
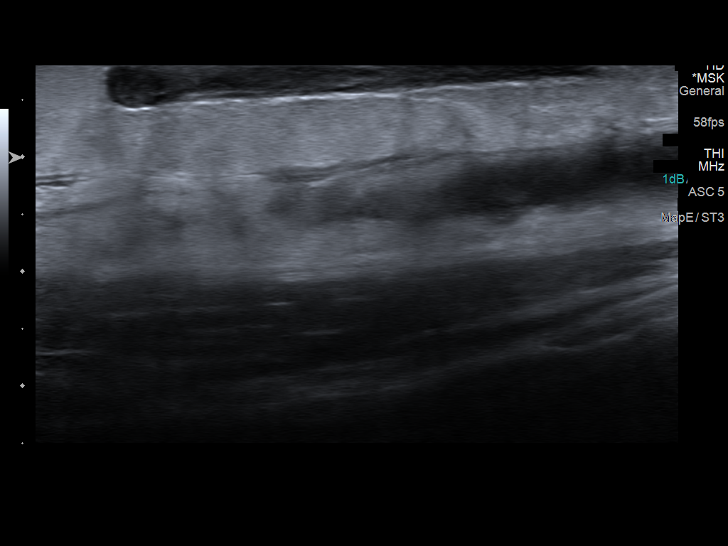
[im 4/12]
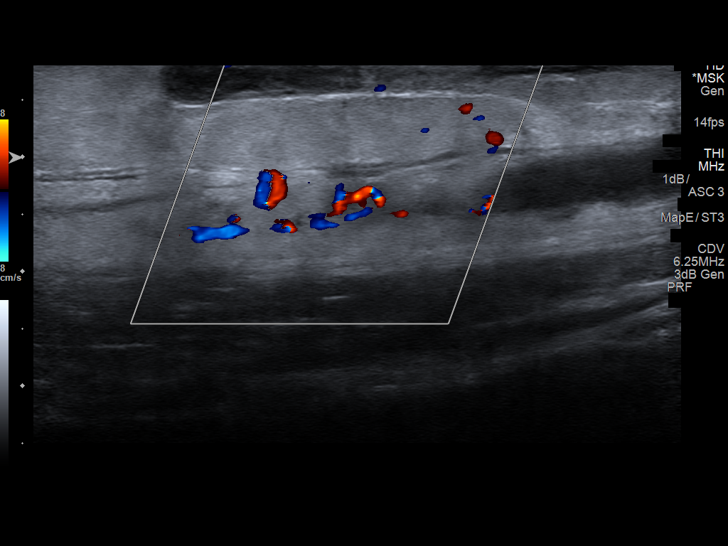
[im 5/12]
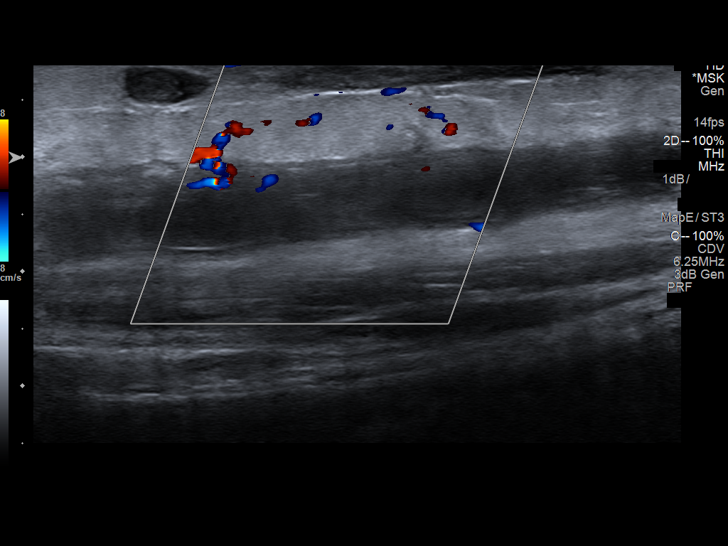
[im 6/12]
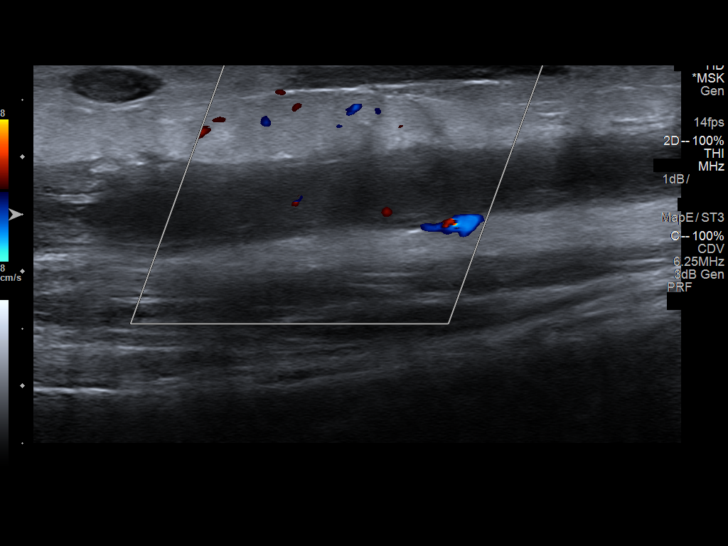
[im 7/12]
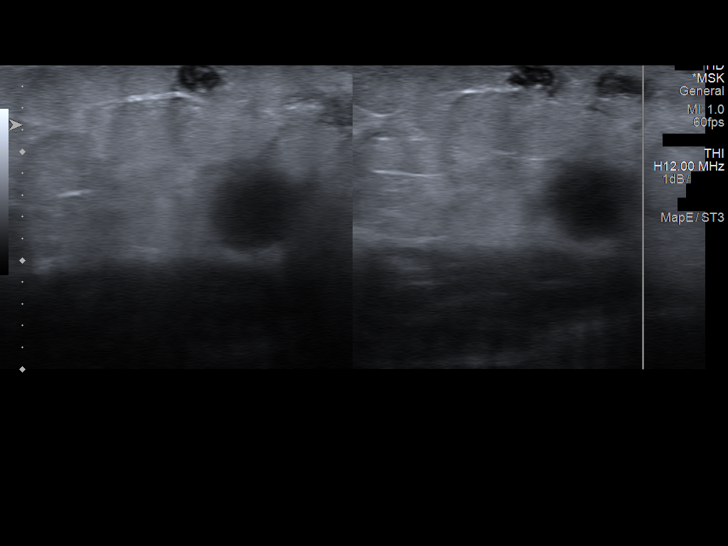
[im 8/12]
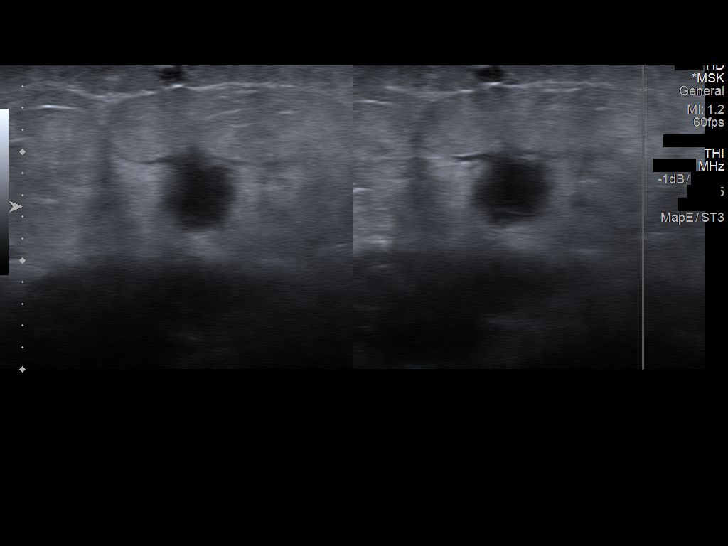
[im 9/12]
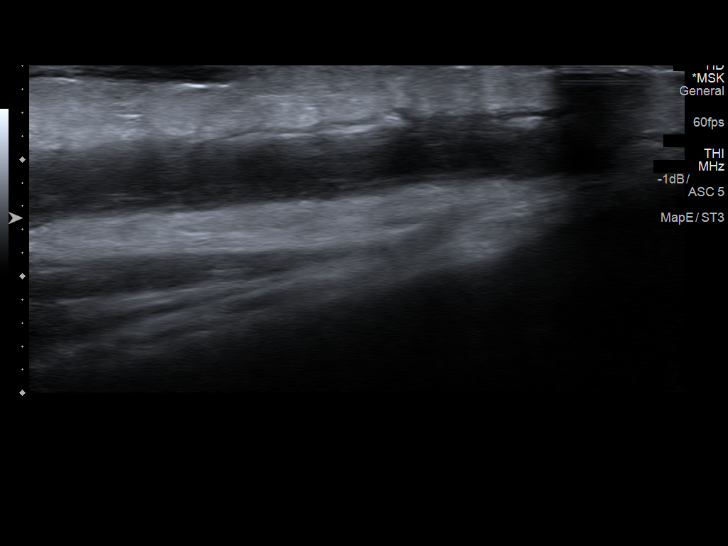
[im 10/12]
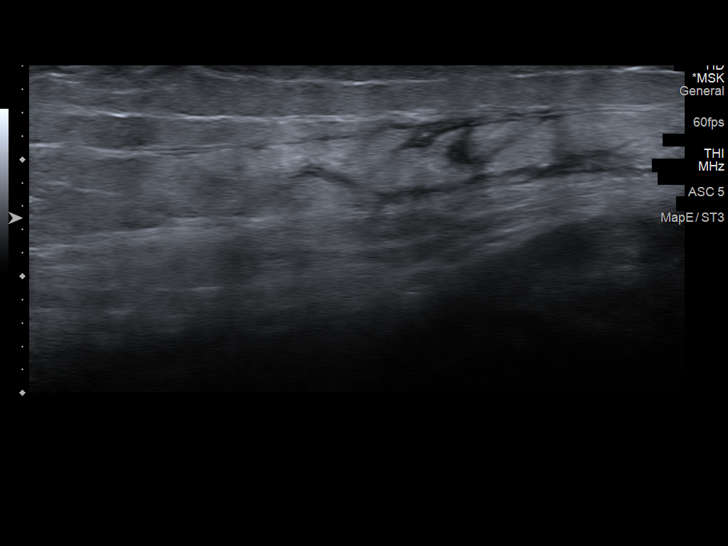
[im 11/12]
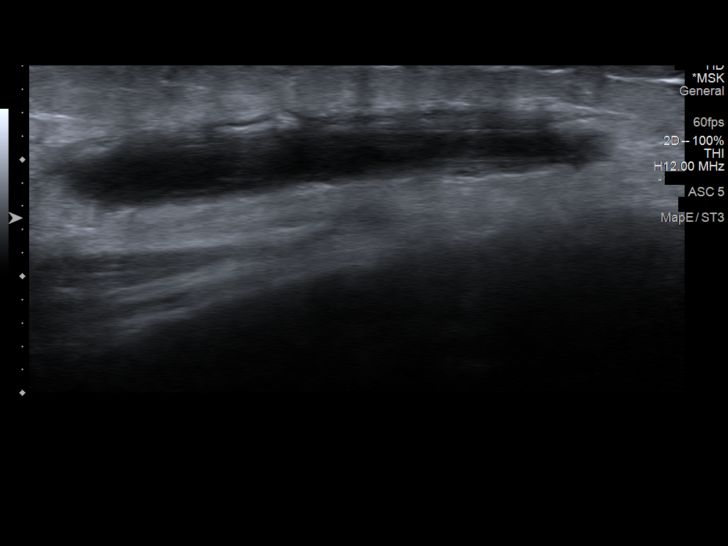
[im 12/12]
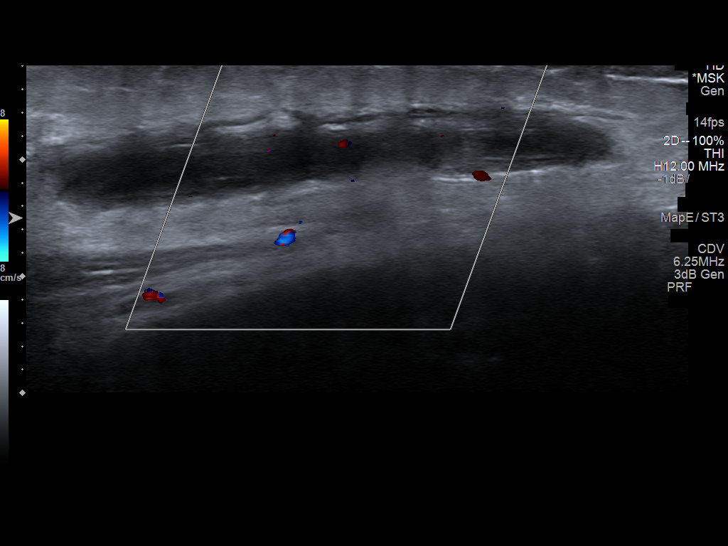

[12 of 12 positions shown; findings below may reference images not displayed]

FINDINGS: Medial distal thigh great saphenous vein thrombosis that appears
complete. The wall appears thickened as well. A smaller and more
superficial subcutaneous vein is also thrombosed. There is regional
subcutaneous reticulation. No neighboring abscess.
IMPRESSION: Great saphenous vein thrombosis in the lower right thigh. A smaller
neighboring subcutaneous vein is also thrombosed. Probable
underlying thrombophlebitis. Consider DVT study to evaluate proximal
extent.
